# Patient Record
Sex: Female | Born: 1965 | Race: Black or African American | Hispanic: No | Marital: Single | Smoking: Current every day smoker
Health system: Southern US, Community
[De-identification: ages and names within clinical notes are randomized; demographics above are authoritative.]

## PROBLEM LIST (undated history)

## (undated) DIAGNOSIS — D649 Anemia, unspecified: Secondary | ICD-10-CM

## (undated) DIAGNOSIS — I10 Essential (primary) hypertension: Secondary | ICD-10-CM

## (undated) DIAGNOSIS — F419 Anxiety disorder, unspecified: Secondary | ICD-10-CM

## (undated) DIAGNOSIS — K219 Gastro-esophageal reflux disease without esophagitis: Secondary | ICD-10-CM

## (undated) DIAGNOSIS — M545 Low back pain: Secondary | ICD-10-CM

## (undated) DIAGNOSIS — F319 Bipolar disorder, unspecified: Secondary | ICD-10-CM

## (undated) DIAGNOSIS — G9511 Acute infarction of spinal cord (embolic) (nonembolic): Secondary | ICD-10-CM

## (undated) DIAGNOSIS — Z8489 Family history of other specified conditions: Secondary | ICD-10-CM

## (undated) DIAGNOSIS — R519 Headache, unspecified: Secondary | ICD-10-CM

## (undated) DIAGNOSIS — I639 Cerebral infarction, unspecified: Secondary | ICD-10-CM

## (undated) DIAGNOSIS — G8929 Other chronic pain: Secondary | ICD-10-CM

## (undated) DIAGNOSIS — Q613 Polycystic kidney, unspecified: Secondary | ICD-10-CM

## (undated) DIAGNOSIS — M1990 Unspecified osteoarthritis, unspecified site: Secondary | ICD-10-CM

## (undated) DIAGNOSIS — N289 Disorder of kidney and ureter, unspecified: Secondary | ICD-10-CM

## (undated) DIAGNOSIS — T4145XA Adverse effect of unspecified anesthetic, initial encounter: Secondary | ICD-10-CM

## (undated) DIAGNOSIS — F329 Major depressive disorder, single episode, unspecified: Secondary | ICD-10-CM

## (undated) DIAGNOSIS — G8220 Paraplegia, unspecified: Secondary | ICD-10-CM

## (undated) DIAGNOSIS — I7101 Dissection of thoracic aorta: Secondary | ICD-10-CM

## (undated) DIAGNOSIS — T8859XA Other complications of anesthesia, initial encounter: Secondary | ICD-10-CM

## (undated) DIAGNOSIS — F32A Depression, unspecified: Secondary | ICD-10-CM

## (undated) DIAGNOSIS — R51 Headache: Secondary | ICD-10-CM

## (undated) DIAGNOSIS — G43909 Migraine, unspecified, not intractable, without status migrainosus: Secondary | ICD-10-CM

---

## 1984-11-22 HISTORY — PX: TUBAL LIGATION: SHX77

## 1999-02-21 ENCOUNTER — Encounter: Payer: Self-pay | Admitting: Emergency Medicine

## 1999-02-21 ENCOUNTER — Emergency Department (HOSPITAL_COMMUNITY): Admission: EM | Admit: 1999-02-21 | Discharge: 1999-02-21 | Payer: Self-pay | Admitting: Emergency Medicine

## 1999-03-24 ENCOUNTER — Encounter: Payer: Self-pay | Admitting: Emergency Medicine

## 1999-03-24 ENCOUNTER — Emergency Department (HOSPITAL_COMMUNITY): Admission: EM | Admit: 1999-03-24 | Discharge: 1999-03-24 | Payer: Self-pay | Admitting: Emergency Medicine

## 2007-12-02 ENCOUNTER — Observation Stay (HOSPITAL_COMMUNITY): Admission: EM | Admit: 2007-12-02 | Discharge: 2007-12-02 | Payer: Self-pay | Admitting: Emergency Medicine

## 2009-04-20 ENCOUNTER — Emergency Department (HOSPITAL_COMMUNITY): Admission: EM | Admit: 2009-04-20 | Discharge: 2009-04-20 | Payer: Self-pay | Admitting: Emergency Medicine

## 2009-12-19 ENCOUNTER — Emergency Department (HOSPITAL_COMMUNITY): Admission: EM | Admit: 2009-12-19 | Discharge: 2009-12-19 | Payer: Self-pay | Admitting: Emergency Medicine

## 2009-12-22 ENCOUNTER — Emergency Department (HOSPITAL_COMMUNITY): Admission: EM | Admit: 2009-12-22 | Discharge: 2009-12-22 | Payer: Self-pay | Admitting: Emergency Medicine

## 2010-01-30 ENCOUNTER — Emergency Department (HOSPITAL_COMMUNITY): Admission: EM | Admit: 2010-01-30 | Discharge: 2010-01-30 | Payer: Self-pay | Admitting: Emergency Medicine

## 2010-02-16 ENCOUNTER — Emergency Department (HOSPITAL_COMMUNITY): Admission: EM | Admit: 2010-02-16 | Discharge: 2010-02-16 | Payer: Self-pay | Admitting: Emergency Medicine

## 2010-04-13 ENCOUNTER — Emergency Department (HOSPITAL_COMMUNITY): Admission: EM | Admit: 2010-04-13 | Discharge: 2010-04-14 | Payer: Self-pay | Admitting: Emergency Medicine

## 2010-05-08 ENCOUNTER — Emergency Department (HOSPITAL_COMMUNITY): Admission: EM | Admit: 2010-05-08 | Discharge: 2010-05-08 | Payer: Self-pay | Admitting: Emergency Medicine

## 2011-02-07 LAB — POCT I-STAT, CHEM 8
BUN: 11 mg/dL (ref 6–23)
Calcium, Ion: 1.12 mmol/L (ref 1.12–1.32)
Chloride: 106 meq/L (ref 96–112)
Creatinine, Ser: 1.1 mg/dL (ref 0.4–1.2)
Glucose, Bld: 100 mg/dL — ABNORMAL HIGH (ref 70–99)
HCT: 48 % — ABNORMAL HIGH (ref 36.0–46.0)
Hemoglobin: 16.3 g/dL — ABNORMAL HIGH (ref 12.0–15.0)
Potassium: 3.6 meq/L (ref 3.5–5.1)
Sodium: 139 meq/L (ref 135–145)
TCO2: 24 mmol/L (ref 0–100)

## 2011-02-07 LAB — URINALYSIS, ROUTINE W REFLEX MICROSCOPIC
Bilirubin Urine: NEGATIVE
Glucose, UA: NEGATIVE mg/dL
Ketones, ur: NEGATIVE mg/dL
Leukocytes, UA: NEGATIVE
Nitrite: NEGATIVE
Protein, ur: >300 mg/dL — AB
Specific Gravity, Urine: 1.014 (ref 1.005–1.030)
Urobilinogen, UA: 0.2 mg/dL (ref 0.0–1.0)
pH: 7 (ref 5.0–8.0)

## 2011-02-07 LAB — CBC
HCT: 44.3 % (ref 36.0–46.0)
Hemoglobin: 14.5 g/dL (ref 12.0–15.0)
MCHC: 32.8 g/dL (ref 30.0–36.0)
MCV: 83.1 fL (ref 78.0–100.0)
Platelets: 214 10*3/uL (ref 150–400)
RBC: 5.34 MIL/uL — ABNORMAL HIGH (ref 3.87–5.11)
RDW: 23.9 % — ABNORMAL HIGH (ref 11.5–15.5)
WBC: 6.2 K/uL (ref 4.0–10.5)

## 2011-02-07 LAB — URINE MICROSCOPIC-ADD ON

## 2011-02-07 LAB — PREGNANCY, URINE: Preg Test, Ur: NEGATIVE

## 2011-02-07 LAB — DIFFERENTIAL
Basophils Absolute: 0.1 K/uL (ref 0.0–0.1)
Basophils Relative: 1 % (ref 0–1)
Eosinophils Absolute: 0 K/uL (ref 0.0–0.7)
Eosinophils Relative: 0 % (ref 0–5)
Lymphocytes Relative: 15 % (ref 12–46)
Lymphs Abs: 0.9 10*3/uL (ref 0.7–4.0)
Monocytes Absolute: 0.3 K/uL (ref 0.1–1.0)
Monocytes Relative: 5 % (ref 3–12)
Neutro Abs: 4.9 K/uL (ref 1.7–7.7)
Neutrophils Relative %: 79 % — ABNORMAL HIGH (ref 43–77)

## 2011-02-07 LAB — POCT CARDIAC MARKERS
CKMB, poc: 1.5 ng/mL (ref 1.0–8.0)
Myoglobin, poc: 94.5 ng/mL (ref 12–200)
Troponin i, poc: <0.05 ng/mL (ref 0.00–0.09)

## 2011-02-08 LAB — COMPREHENSIVE METABOLIC PANEL WITH GFR
ALT: 13 U/L (ref 0–35)
AST: 16 U/L (ref 0–37)
Albumin: 3.4 g/dL — ABNORMAL LOW (ref 3.5–5.2)
Alkaline Phosphatase: 46 U/L (ref 39–117)
BUN: 16 mg/dL (ref 6–23)
CO2: 24 meq/L (ref 19–32)
Calcium: 8.8 mg/dL (ref 8.4–10.5)
Chloride: 108 meq/L (ref 96–112)
Creatinine, Ser: 1.31 mg/dL — ABNORMAL HIGH (ref 0.4–1.2)
GFR calc non Af Amer: 44 mL/min — ABNORMAL LOW
Glucose, Bld: 110 mg/dL — ABNORMAL HIGH (ref 70–99)
Potassium: 3.3 meq/L — ABNORMAL LOW (ref 3.5–5.1)
Sodium: 139 meq/L (ref 135–145)
Total Bilirubin: 0.3 mg/dL (ref 0.3–1.2)
Total Protein: 6.5 g/dL (ref 6.0–8.3)

## 2011-02-08 LAB — URINE MICROSCOPIC-ADD ON

## 2011-02-08 LAB — CBC
HCT: 39.6 % (ref 36.0–46.0)
Hemoglobin: 12.9 g/dL (ref 12.0–15.0)
MCHC: 32.6 g/dL (ref 30.0–36.0)
MCV: 80.2 fL (ref 78.0–100.0)
Platelets: 223 10*3/uL (ref 150–400)
RBC: 4.94 MIL/uL (ref 3.87–5.11)
RDW: 29.5 % — ABNORMAL HIGH (ref 11.5–15.5)
WBC: 5.9 10*3/uL (ref 4.0–10.5)

## 2011-02-08 LAB — POCT I-STAT, CHEM 8
BUN: 10 mg/dL (ref 6–23)
Calcium, Ion: 1.16 mmol/L (ref 1.12–1.32)
Chloride: 109 meq/L (ref 96–112)
Creatinine, Ser: 1.1 mg/dL (ref 0.4–1.2)
Glucose, Bld: 79 mg/dL (ref 70–99)
HCT: 31 % — ABNORMAL LOW (ref 36.0–46.0)
Hemoglobin: 10.5 g/dL — ABNORMAL LOW (ref 12.0–15.0)
Potassium: 3.5 meq/L (ref 3.5–5.1)
Sodium: 140 meq/L (ref 135–145)
TCO2: 25 mmol/L (ref 0–100)

## 2011-02-08 LAB — DIFFERENTIAL
Basophils Absolute: 0 10*3/uL (ref 0.0–0.1)
Basophils Relative: 0 % (ref 0–1)
Eosinophils Absolute: 0 10*3/uL (ref 0.0–0.7)
Eosinophils Relative: 0 % (ref 0–5)
Lymphocytes Relative: 17 % (ref 12–46)
Lymphs Abs: 1 10*3/uL (ref 0.7–4.0)
Monocytes Absolute: 0.2 10*3/uL (ref 0.1–1.0)
Monocytes Relative: 4 % (ref 3–12)
Neutro Abs: 4.7 10*3/uL (ref 1.7–7.7)
Neutrophils Relative %: 79 % — ABNORMAL HIGH (ref 43–77)

## 2011-02-08 LAB — WET PREP, GENITAL
Trich, Wet Prep: NONE SEEN
WBC, Wet Prep HPF POC: NONE SEEN
Yeast Wet Prep HPF POC: NONE SEEN

## 2011-02-08 LAB — URINALYSIS, ROUTINE W REFLEX MICROSCOPIC
Glucose, UA: NEGATIVE mg/dL
Ketones, ur: NEGATIVE mg/dL
Nitrite: NEGATIVE
Protein, ur: 30 mg/dL — AB
Specific Gravity, Urine: 1.018 (ref 1.005–1.030)
Specific Gravity, Urine: 1.029 (ref 1.005–1.030)
Urobilinogen, UA: 1 mg/dL (ref 0.0–1.0)
pH: 5.5 (ref 5.0–8.0)

## 2011-02-08 LAB — RAPID URINE DRUG SCREEN, HOSP PERFORMED
Amphetamines: NOT DETECTED
Benzodiazepines: NOT DETECTED
Cocaine: NOT DETECTED

## 2011-02-08 LAB — LIPASE, BLOOD: Lipase: 21 U/L (ref 11–59)

## 2011-02-08 LAB — GC/CHLAMYDIA PROBE AMP, GENITAL
Chlamydia, DNA Probe: NEGATIVE
GC Probe Amp, Genital: NEGATIVE

## 2011-02-09 ENCOUNTER — Encounter: Payer: Self-pay | Admitting: Internal Medicine

## 2011-02-09 ENCOUNTER — Encounter (INDEPENDENT_AMBULATORY_CARE_PROVIDER_SITE_OTHER): Payer: Self-pay | Admitting: Internal Medicine

## 2011-02-09 DIAGNOSIS — I10 Essential (primary) hypertension: Secondary | ICD-10-CM | POA: Insufficient documentation

## 2011-02-09 DIAGNOSIS — R110 Nausea: Secondary | ICD-10-CM | POA: Insufficient documentation

## 2011-02-09 DIAGNOSIS — D649 Anemia, unspecified: Secondary | ICD-10-CM | POA: Insufficient documentation

## 2011-02-09 DIAGNOSIS — F17200 Nicotine dependence, unspecified, uncomplicated: Secondary | ICD-10-CM | POA: Insufficient documentation

## 2011-02-09 DIAGNOSIS — F1410 Cocaine abuse, uncomplicated: Secondary | ICD-10-CM | POA: Insufficient documentation

## 2011-02-15 LAB — POCT I-STAT, CHEM 8
BUN: 16 mg/dL (ref 6–23)
Creatinine, Ser: 1 mg/dL (ref 0.4–1.2)
Creatinine, Ser: 1.1 mg/dL (ref 0.4–1.2)
HCT: 32 % — ABNORMAL LOW (ref 36.0–46.0)
Hemoglobin: 10.2 g/dL — ABNORMAL LOW (ref 12.0–15.0)
Hemoglobin: 10.9 g/dL — ABNORMAL LOW (ref 12.0–15.0)
Potassium: 3.5 mEq/L (ref 3.5–5.1)
Sodium: 139 mEq/L (ref 135–145)
Sodium: 140 mEq/L (ref 135–145)
TCO2: 23 mmol/L (ref 0–100)
TCO2: 25 mmol/L (ref 0–100)

## 2011-02-15 LAB — URINALYSIS, ROUTINE W REFLEX MICROSCOPIC
Glucose, UA: NEGATIVE mg/dL
Ketones, ur: NEGATIVE mg/dL
Protein, ur: NEGATIVE mg/dL
Urobilinogen, UA: 0.2 mg/dL (ref 0.0–1.0)

## 2011-02-15 LAB — RAPID URINE DRUG SCREEN, HOSP PERFORMED
Benzodiazepines: NOT DETECTED
Cocaine: NOT DETECTED
Tetrahydrocannabinol: NOT DETECTED

## 2011-02-15 LAB — DIFFERENTIAL
Band Neutrophils: 0 % (ref 0–10)
Basophils Absolute: 0 10*3/uL (ref 0.0–0.1)
Basophils Relative: 0 % (ref 0–1)
Blasts: 0 %
Eosinophils Absolute: 0.1 10*3/uL (ref 0.0–0.7)
Eosinophils Relative: 2 % (ref 0–5)
Lymphocytes Relative: 33 % (ref 12–46)
Lymphs Abs: 1.7 10*3/uL (ref 0.7–4.0)
Monocytes Absolute: 0.4 10*3/uL (ref 0.1–1.0)
Monocytes Relative: 8 % (ref 3–12)

## 2011-02-15 LAB — POCT CARDIAC MARKERS
CKMB, poc: <1 ng/mL — ABNORMAL LOW (ref 1.0–8.0)
Myoglobin, poc: 49.3 ng/mL (ref 12–200)
Myoglobin, poc: 51.8 ng/mL (ref 12–200)

## 2011-02-15 LAB — CBC
HCT: 28.6 % — ABNORMAL LOW (ref 36.0–46.0)
Hemoglobin: 8.6 g/dL — ABNORMAL LOW (ref 12.0–15.0)
MCV: 64.7 fL — ABNORMAL LOW (ref 78.0–100.0)
RBC: 4.42 MIL/uL (ref 3.87–5.11)
WBC: 5.1 10*3/uL (ref 4.0–10.5)

## 2011-02-15 LAB — URINE MICROSCOPIC-ADD ON

## 2011-02-18 NOTE — Letter (Signed)
Summary: Generic Letter  [facility]  [address] [place], [place] [address]   Phone: [phone]  Fax: [phone]    02/09/2011  Fidela Juneau [address] [place] [address]   Re:  [facility]      [address]      [place], [place]  [address]  Dear Ms. Davis:  Ms. Luff was seen today in our clinic for her hypertension.  We are scheduling a complete physical.       Sincerely,   Julieanne Manson MD

## 2011-02-18 NOTE — Letter (Signed)
Summary: Letter//FAXED TO Maralyn Sago DAVIS  Letter//FAXED TO SARAH DAVIS   Imported By: Arta Bruce 02/09/2011 16:13:18  _____________________________________________________________________  External Attachment:    Type:   Image     Comment:   External Document

## 2011-02-18 NOTE — Assessment & Plan Note (Signed)
Summary: Here to establish, BP concerns   Vital Signs:  Patient profile:   45 year old female Menstrual status:  regular LMP:     02/01/2011 Height:      65.75 inches Weight:      131.50 pounds BMI:     21.46 Temp:     97.9 degrees F oral Pulse rate:   80 / minute Pulse rhythm:   regular Resp:     26 per minute BP sitting:   152 / 94  (left arm) Cuff size:   regular  Vitals Entered By: Hale Drone CMA (February 09, 2011 2:43 PM) CC: Here to establish... BP concerns... Needs refills on her Lisinopril HCTZ and Amlodipine.... Needs documentation faxed to her lawyer stating she was here so they can start her SSI. Is Patient Diabetic? No Pain Assessment Patient in pain? no       Does patient need assistance? Functional Status Self care Ambulation Normal LMP (date): 02/01/2011     Menstrual Status regular Enter LMP: 02/01/2011   CC:  Here to establish... BP concerns... Needs refills on her Lisinopril HCTZ and Amlodipine.... Needs documentation faxed to her lawyer stating she was here so they can start her SSI.[name]  History of Present Illness: 45 yo female here to establish.  1.  Hypertension:  Diagnosed 2 years ago.  Pt. cannot say whether her bp was controlled on meds she was last prescribed or not.  Does state she did not have a headache when taking the meds (Amlodipine and Lisinopril/HCTZ.    2.  Bipolar Disorder, mainly depressed.  Follows at [facility].  3. ? Left Ovarian Cyst;  diagnosed 1-2 years ago, but did not have any medical coverage, so did not have anything done.  Diagnosed in ED per pt.  Echart, however, shows no ovarian lesion, but does have bilateral  polycystic kidney disease.  She also was noted to have a fibroid.  CT and ultrasound done in evaluation in past..  4.  Nausea:  Awakens with nausea every day.  By hospital records, appears she has been seen in ED mulitple times for this with extensive workup and no findings.  No vomiting.  Goes away when has a  cup of coffee and cigarette.  Has been having this for 2 years.  Can have acid reflux symptoms if eats spicy foods.  Has not had melena or hematochezia.  Has never tried an acid reducer.  Does have a history of anemia.  Does have regular periods.  They can be heavy.  Has never performed guaiac cards.  No NSAIDS    6.  To bacco Abuse:  smokes 1ppd-2ppd.  Current Medications (verified): 1)  Lisinopril-Hydrochlorothiazide 10-12.5 Mg Tabs (Lisinopril-Hydrochlorothiazide) .[name].. 1 Tab Daily (Dr. Trellis Moment, Eugenia Pancoast) 2)  Amlodipine Besylate 5 Mg Tabs (Amlodipine Besylate) .[name].. 1 Tab Daily (Dr. Trellis Moment, Eugenia Pancoast)  Allergies (verified): No Known Drug Allergies  Social History: Lives with her daughter. Unemployed--trying to get disability States she cannot read. Tobacco:  Started age 83.  Smokes up to 2 ppd Alcohol:  Drinks mainly on weekend.  Two 40 oz a day during weekend. 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 Lungs are clear to auscultation, no crackles or wheezes. Heart:  Normal rate and regular rhythm. S1 and S2 normal without gallop, murmur, click, rub or other extra sounds.  Radial pulses normal and equal Abdomen:  soft, normal bowel sounds, no masses, no hepatomegaly, and no splenomegaly.  Some tenderness in LLQ.  No rebound or peritoneal signs   Impression & Recommendations:  Problem # 1:  HYPERTENSION, ESSENTIAL (ICD-401.9) Restart meds Her updated medication list for this problem includes:    Lisinopril-hydrochlorothiazide 10-12.5 Mg Tabs (Lisinopril-hydrochlorothiazide) .[name]... 1 tab by mouth daily    Amlodipine Besylate 5 Mg Tabs (Amlodipine besylate) .[name]... 1 tab by mouth daily  Problem # 2:  TOBACCO ABUSE (ICD-305.1) Encouraged cessation. Pt. seems to have little interest discussed this may be adding to nausea in morning  Problem # 3:  NAUSEA  (ICD-787.02) Start Ranitidine Encouraged a healthier diet. Avoid NSAIDS  Complete Medication List: 1)  Lisinopril-hydrochlorothiazide 10-12.5 Mg Tabs (Lisinopril-hydrochlorothiazide) .[name].. 1 tab by mouth daily 2)  Amlodipine Besylate 5 Mg Tabs (Amlodipine besylate) .[name].. 1 tab by mouth daily 3)  Zoloft 100 Mg Tabs (Sertraline hcl) .[name].. 1 tab by mouth daily  [facility] 4)  Seroquel 100 Mg Tabs (Quetiapine fumarate) .[name].. 1 tab by mouth at bedtime.    [facility] 5)  Ranitidine Hcl 150 Mg Tabs (Ranitidine hcl) .[name].. 1 tab by mouth two times a day  Patient Instructions: 1)  Schedule CPP with Dr. Delrae Alfred next available. 2)  Nurse visit for bp check and labs in 1 month:  CBC, CMET, UA, 3)  Bring in completed stool cards to nurse visit. Prescriptions: RANITIDINE HCL 150 MG TABS (RANITIDINE HCL) 1 tab by mouth two times a day  #60 x 4   Entered and Authorized by:   Julieanne Manson MD   Signed by:   Julieanne Manson MD on 02/09/2011   Method used:   Faxed to ...       [facility] (retail)       [address] [place], [place]  [address]       Ph: [phone]       Fax: [phone]   RxID:   [id number] AMLODIPINE BESYLATE 5 MG TABS (AMLODIPINE BESYLATE) 1 tab by mouth daily  #30 x 4   Entered and Authorized by:   Julieanne Manson MD   Signed by:   Julieanne Manson MD on 02/09/2011   Method used:   Faxed to ...       [facility] (retail)       [address] [place], [place]  [address]       Ph: [phone]       Fax: [phone]   RxID:   [id number] LISINOPRIL-HYDROCHLOROTHIAZIDE 10-12.5 MG TABS (LISINOPRIL-HYDROCHLOROTHIAZIDE) 1 tab by mouth daily  #30 x 4   Entered and Authorized by:   Julieanne Manson MD   Signed by:   Julieanne Manson MD on 02/09/2011   Method used:   Faxed to ...       [facility] (retail)       [address] [place], [place]  [address]       Ph: [phone]       Fax: [phone]   RxID:   [id number]    Orders Added: 1)  New Patient Level II [48546]

## 2011-03-02 LAB — LIPASE, BLOOD: Lipase: 22 U/L (ref 11–59)

## 2011-03-02 LAB — CBC
HCT: 28.3 % — ABNORMAL LOW (ref 36.0–46.0)
Hemoglobin: 9.2 g/dL — ABNORMAL LOW (ref 12.0–15.0)
WBC: 7.9 10*3/uL (ref 4.0–10.5)

## 2011-03-02 LAB — COMPREHENSIVE METABOLIC PANEL
Alkaline Phosphatase: 57 U/L (ref 39–117)
BUN: 9 mg/dL (ref 6–23)
Chloride: 106 mEq/L (ref 96–112)
Glucose, Bld: 86 mg/dL (ref 70–99)
Potassium: 3.6 mEq/L (ref 3.5–5.1)
Total Bilirubin: 0.6 mg/dL (ref 0.3–1.2)

## 2011-03-02 LAB — URINALYSIS, ROUTINE W REFLEX MICROSCOPIC
Glucose, UA: NEGATIVE mg/dL
Hgb urine dipstick: NEGATIVE
Protein, ur: NEGATIVE mg/dL

## 2011-03-02 LAB — DIFFERENTIAL
Eosinophils Absolute: 0.1 10*3/uL (ref 0.0–0.7)
Monocytes Absolute: 0.5 10*3/uL (ref 0.1–1.0)
Neutrophils Relative %: 81 % — ABNORMAL HIGH (ref 43–77)

## 2011-03-02 LAB — GC/CHLAMYDIA PROBE AMP, GENITAL: GC Probe Amp, Genital: NEGATIVE

## 2011-03-02 LAB — WET PREP, GENITAL: Yeast Wet Prep HPF POC: NONE SEEN

## 2011-03-02 LAB — POCT PREGNANCY, URINE: Preg Test, Ur: NEGATIVE

## 2011-04-06 NOTE — Discharge Summary (Signed)
[name], BENNIS                ACCOUNT NO.:  [id number]   MEDICAL RECORD NO.:  [id number]          PATIENT TYPE:  OBV   LOCATION:  1536                         FACILITY[facility]  [facility]   PHYSICIAN:  Altha Harm, MDDATE OF BIRTH:  1966/04/18   DATE OF ADMISSION:  12/02/2007  DATE OF DISCHARGE:  12/02/2007                               DISCHARGE SUMMARY   DISCHARGE DISPOSITION:  Home with referral to [name] clinic.  Please note this patient was brought in on an observation basis only.   FINAL DISCHARGE DIAGNOSES:  1. Gastritis, resolved.  2. Mild dehydration, resolved.  3. Alcohol intoxication, resolved.  4. Chronic iron deficiency anemia, likely associated with      menometrorrhagia.  5. Lower abdominal pain.   DISCHARGE MEDICATIONS:  1. Iron sulfate 325 mg p.o. t.i.d.  2. Thiamin 50 mg p.o. daily.  3. Folate 1 mg p.o. daily.  4. Vicodin 1 tab p.o. q.4-6h. p.r.n. pain.   CONSULTATIONS:  None.   PROCEDURES:  None.   DIAGNOSTIC STUDIES:  A CT of the abdomen and pelvis with contrast, which  showed cysts in the kidney consistent with adult polycystic kidney  disease with multiple hepatic and renal cysts bilaterally.  No acute  abdominal findings.  No acute pelvic findings.   Laboratory studies of importance:  White blood cell count 6, hemoglobin  7.7, hematocrit 24.8, platelet count 335.  Reticulocyte count 0.8.  Absolute reticulocyte count 31.5.  PT 13.7, INR 1, PTT 33.  TSH 1.752,  normal.  Serum iron 10.  Ferritin 4.   CHIEF COMPLAINT:  Emesis and abdominal pain.   HISTORY OF PRESENT ILLNESS:  Please see the H&P for details of the HPI;  however, in short, this is a patient who states that she has had a 3-  week course of abdominal pain which is occurring in the lower abdominal  area; however, the pain was not sufficient to prevent the patient from  participating in her activities of daily living or in sexual coital  activity.  The patient also had complaints of  emesis occurring  approximately 12 hours prior to arrival to the emergency room; however,  the patient had been drinking heavily with an alcohol level of 139 mg  per dL.  The patient was evaluated in the emergency room and we were  asked to see the patient as the patient continued to be unable to  tolerate oral intake while in the emergency room.  The patient was  admitted on an observation basis essentially for the gastritis and the  intractable vomiting.   HOSPITAL COURSE:  The patient was admitted.  She was given aggressive  hydration.  The patient was started on liquids and advanced to a regular  diet, which she tolerated without any difficulty.  The patient was  placed on Protonix.   The patient was found to have a profound anemia of 7.7; however, the  patient was hemodynamically stable and denied any weakness, any  dizziness or any symptoms associated with the anemia. The pt was able to  ambulate around the unit without any difficulty or  ned for assistance.  Iron studies were done that showed the patient had a low iron level.  The patient was started on iron sulfate and is to continue on her iron  sulfate.  In terms of her abdominal pain, the patient was treated  intermittently on a p.r.n. basis with Vicodin, which she tolerated well.  The patient's pain does not seem to be a great issue for her right now.  CT of the abdomen was done and did not reveal any findings except  findings to support adult polycystic kidney disease.  The patient was  counseled on alcohol and tobacco cessation; however, the patient is in a  pre-contemplative stage and does not wish to enter into a detox program  at this time.  The patient was counseled on her profound anemia and the  fact that her alcohol associated with gastritis could continue to lead  to further anemia.  The patient is being referred to the [facility]  clinic for followup and can be followed up as an outpatient for further  evaluation  of her profound anemia.      Altha Harm, MD  Electronically Signed     MAM/MEDQ  D:  12/02/2007  T:  12/02/2007  Job:  [id number]

## 2011-04-06 NOTE — H&P (Signed)
NAMELUTHER, NEWHOUSE NO.:  [id number]   MEDICAL RECORD NO.:  [id number]          PATIENT TYPE:  OBV   LOCATION:  0102                         [facility]ACILITY[facility]  [facility]   PHYSICIAN:  Altha Harm, MDDATE OF BIRTH:  1966-08-26   DATE OF ADMISSION:  12/02/2007  DATE OF DISCHARGE:                              HISTORY & PHYSICAL   CHIEF COMPLAINT:  Abdominal pain and emesis.   HISTORY OF PRESENT ILLNESS:  This is a 45 year old African-American  female who presented to the emergency room with alcoholic intoxication  and complaints of abdominal pain. The patient states that the pain has  been occurring for the three weeks and is intermittent to sharp in  nature. She states the pain is nonradiating. The pain, she states, has  not gotten progressively worse but has persisted over the past three  weeks. She states that in the last 72 hours she has had several episodes  of emesis which is nonbilious and nonhematemesous. The patient states  that she is unable to keep any food down. However, Incidentally the  patient has an alcohol level at 139.   The patient states that she is also sexually active and has had sexual  activity approximately one to two times a day for three to five days in  the last week or two. The patient is unable to quantify her abdominal  pain at this time. The patient states that she has also had sick  contacts with several members of her community having gastroenteritis.  The patient denies any vaginal discharge. She denies any vaginal  bleeding. She denies any diarrhea. She denies any fever or chills. She  denies any cough, dizziness. The patient denies any loss of  consciousness. She denies any palpitations.   During the workup here in the emergency room, the patient was found to  have hemoglobin of 7.7, a platelet count of 335, white blood cell count  6. The patient also had a CT of the abdomen and pelvis performed with  contrast. Thus, it is  important that the patient is able to maintain her  hydration in order to prevent contrast-induced nephropathy. We are asked  to see the patient and admit her for the emesis and the abdominal pain.   PAST MEDICAL HISTORY:  Not significant. The patient has no chronic  illnesses. She takes no medication on a chronic basis. However, she  states she has been taking Excedrin.   FAMILY HISTORY:  The patient is unable to provide a family history.   SOCIAL HISTORY:  The patient drinks alcohol approximately 120 ounces of  beer every other day. The patient smokes two packs per day of cigarettes  for 31 years, and the patient denies any drug use. As stated before, she  is currently not on any medication. She has no known drug allergies, and  she has no primary care physician.   REVIEW OF SYSTEMS:  Twelve systems are reviewed. All systems are  negative except as noted in the HPI.   LABORATORY STUDIES:  She had the following:  Sodium 133, potassium 3.2,  chloride  106, bicarb 19, creatinine 12, creatinine 1.06. White blood  cell count 6, hemoglobin 7.7, hematocrit 24.8, platelet count 335.  Alcohol level 139. Wet prep is negative. CT of the abdomen and pelvis is  negative for any acute abdominal series. However, the patient does have  multiple cysts in the kidneys. Chest x-ray negative for any acute  pulmonary conditions.   PHYSICAL EXAMINATION:  The patient has a temperature of 98.6, blood  pressure of 104/92, heart rate of 79, respiratory rate 16. O2  saturations are 98% on room air.  HEENT:  She is normocephalic, atraumatic. Pupils are equal, round, and  reactive to light and accommodation. Extraocular movements are intact.  Fundi are benign. Oropharynx moist. No exudate, erythema or lesions  noted.  NECK EXAM:  Trachea is midline. No masses. No thyromegaly. No JVD. No  carotid bruit.  RESPIRATORY EXAMINATION:  The patient has a normal respiratory effort.  Equal excursion bilaterally. No  wheezing or rhonchi noted  CARDIOVASCULAR:  She has got a normal S1 and S2. No murmurs, rubs, or  gallops are noted. PMI is nondisplaced. No heaves or thrills on  palpation.  ABDOMINAL EXAM:  The patient has normal active bowel sounds. Abdomen is  soft, nontender, nondistended. No masses. No hepatosplenomegaly noted.  VAGINAL EXAMINATION:  The patient is refusing vaginal examination at  this time.  MUSCULOSKELETAL:  The patient has no warmth, swelling or erythema around  the joints. She has got no spinal tenderness. The patient does have some  soreness on the paraspinal muscles with some spasm noted. Gait:  The  patient has a normal gait. There is ataxia or staggering noted.  LYMPH NODE SURVEY:  She has got no cervical, axillary or inguinal  lymphadenopathy.  NEUROLOGICAL:  Cranial nerves II-XII are grossly intact. The patient has  strength 5/5 bilaterally upper and lower extremities. DTRs are 2+  bilaterally upper and lower extremities. The patient is intact to light  touch and proprioception. The patient is able to tandem walk without any  difficulty. She has no pass pointing present, and she is able to do heel  to toe slides without any differential. Romberg is negative. There is no  asterixis present on examination.  PSYCHIATRIC:  The patient is alert and oriented x3. She has good recent  to remote recall. The patient has good cognition. However, her insight  appears to be impaired which is related to alcohol use.   ASSESSMENT AND PLAN:  The patient presents with:  1. Chronic anemia. The patient is completely hemodynamically stable      and shows no signs of this being an acute process. The patient      needs to be referred to an outpatient primary care physician for      workup for her anemia. In the meantime, I will get an iron panel on      patient here in the hospital, and if indeed she shows iron-      deficiency anemia, I will go ahead and start her on iron.  2. Nausea,  vomiting. The patient has not had any emesis since last      night at 11:00. Right now, she is being challenged with fluids. If      the patient is able to maintain her hydration, then she can      certainly be discharged for further workup as an outpatient. In the      meantime, the patient was given IV hydration to augment her  hydration, particularly post IV contrast use.  3. Alcohol intoxication. The patient presented with alcohol      intoxication. However, her alcohol levels have dropped to the level      in which the patient is alert and oriented x3. The patient is able      to ambulate without any abnormalities and is not a risk for falling      at this time. I asked the patient if she is interested in stopping      drinking, and the patient has denied that that is a desire for her      right now. Thus, nothing further will be pursued on this patient.      If the patient shows any signs or symptoms of DTs, then a DT      protocol will be instituted. However, at this time, we will not get      the patient any benzodiazepines or any sedatives. The patient will      be given thiamine and folate as multivitamin supplements, however.  4. In terms of her abdominal pain, the patient, although she has lower      abdominal pain, does not seem to have a physical basis for this.      The patient is unable to quantify her pain. However, her abdominal      pain does not appear to present her from performing her activities      of daily living and at this time appears to be a minor concern for      the patient. In terms of the vomiting, this is probably secondary      to gastritis which may be viral in nature or it may be more likely      related to her alcohol use. The patient will be put on Protonix and      asked to follow up as an outpatient.   The patient has been counseled against further alcohol use and further  tobacco use. I see a need only for observation on this patient, unless   the patient's medical condition changes, which would necessitate her  conversion to a full admission.      Altha Harm, MD  Electronically Signed     MAM/MEDQ  D:  12/02/2007  T:  12/02/2007  Job:  [id number]

## 2011-08-12 LAB — BASIC METABOLIC PANEL
BUN: 10
CO2: 22
Chloride: 113 — ABNORMAL HIGH
Glucose, Bld: 101 — ABNORMAL HIGH
Potassium: 4.2
Sodium: 138

## 2011-08-12 LAB — LIPASE, BLOOD: Lipase: 29

## 2011-08-12 LAB — IRON AND TIBC
Iron: 10 — ABNORMAL LOW
UIBC: 323
UIBC: 363

## 2011-08-12 LAB — COMPREHENSIVE METABOLIC PANEL
ALT: 13
Alkaline Phosphatase: 50
BUN: 12
CO2: 19
Chloride: 106
Glucose, Bld: 88
Potassium: 3.2 — ABNORMAL LOW
Sodium: 133 — ABNORMAL LOW
Total Bilirubin: 0.5

## 2011-08-12 LAB — CBC
HCT: 24.8 — ABNORMAL LOW
Hemoglobin: 7.7 — CL
MCV: 61.7 — ABNORMAL LOW
Platelets: 335
WBC: 6

## 2011-08-12 LAB — URINALYSIS, ROUTINE W REFLEX MICROSCOPIC
Glucose, UA: NEGATIVE
Hgb urine dipstick: NEGATIVE
Specific Gravity, Urine: 1.002 — ABNORMAL LOW
Urobilinogen, UA: 0.2

## 2011-08-12 LAB — DIFFERENTIAL
Eosinophils Absolute: 0.1
Eosinophils Relative: 2
Lymphs Abs: 2.6
Monocytes Absolute: 0.4
Neutrophils Relative %: 46

## 2011-08-12 LAB — WET PREP, GENITAL: Yeast Wet Prep HPF POC: NONE SEEN

## 2011-08-12 LAB — TYPE AND SCREEN
ABO/RH(D): O POS
Antibody Screen: NEGATIVE

## 2011-08-12 LAB — POCT PREGNANCY, URINE
Operator id: 24446
Preg Test, Ur: NEGATIVE

## 2011-08-12 LAB — PROTIME-INR
INR: 1
Prothrombin Time: 13.7

## 2011-08-12 LAB — APTT: aPTT: 33

## 2011-08-12 LAB — FERRITIN: Ferritin: 4 — ABNORMAL LOW (ref 10–291)

## 2011-08-12 LAB — ETHANOL: Alcohol, Ethyl (B): 139 — ABNORMAL HIGH

## 2013-06-01 ENCOUNTER — Other Ambulatory Visit (HOSPITAL_COMMUNITY): Payer: Self-pay | Admitting: Internal Medicine

## 2013-06-01 DIAGNOSIS — N946 Dysmenorrhea, unspecified: Secondary | ICD-10-CM

## 2013-06-01 DIAGNOSIS — Z1231 Encounter for screening mammogram for malignant neoplasm of breast: Secondary | ICD-10-CM

## 2013-06-13 ENCOUNTER — Ambulatory Visit (HOSPITAL_COMMUNITY): Payer: Self-pay | Attending: Internal Medicine

## 2013-06-13 ENCOUNTER — Ambulatory Visit (HOSPITAL_COMMUNITY): Admission: RE | Admit: 2013-06-13 | Payer: Self-pay | Source: Ambulatory Visit

## 2014-06-29 ENCOUNTER — Encounter (HOSPITAL_COMMUNITY): Payer: Self-pay | Admitting: Anesthesiology

## 2014-06-29 ENCOUNTER — Emergency Department (HOSPITAL_COMMUNITY): Payer: Medicaid Other

## 2014-06-29 ENCOUNTER — Inpatient Hospital Stay (HOSPITAL_COMMUNITY)
Admission: EM | Admit: 2014-06-29 | Discharge: 2014-07-08 | DRG: 091 | Disposition: A | Discharge status: Rehabilitation Facility | Payer: Medicaid Other | Attending: Internal Medicine | Admitting: Internal Medicine

## 2014-06-29 ENCOUNTER — Encounter (HOSPITAL_COMMUNITY): Payer: Self-pay | Admitting: Emergency Medicine

## 2014-06-29 DIAGNOSIS — F3289 Other specified depressive episodes: Secondary | ICD-10-CM | POA: Diagnosis present

## 2014-06-29 DIAGNOSIS — I7100 Dissection of unspecified site of aorta: Secondary | ICD-10-CM

## 2014-06-29 DIAGNOSIS — Q613 Polycystic kidney, unspecified: Secondary | ICD-10-CM | POA: Diagnosis not present

## 2014-06-29 DIAGNOSIS — N183 Chronic kidney disease, stage 3 unspecified: Secondary | ICD-10-CM | POA: Diagnosis present

## 2014-06-29 DIAGNOSIS — N179 Acute kidney failure, unspecified: Secondary | ICD-10-CM

## 2014-06-29 DIAGNOSIS — I71019 Dissection of thoracic aorta, unspecified: Secondary | ICD-10-CM | POA: Diagnosis present

## 2014-06-29 DIAGNOSIS — R6250 Unspecified lack of expected normal physiological development in childhood: Secondary | ICD-10-CM | POA: Diagnosis present

## 2014-06-29 DIAGNOSIS — I7103 Dissection of thoracoabdominal aorta: Secondary | ICD-10-CM

## 2014-06-29 DIAGNOSIS — R5381 Other malaise: Secondary | ICD-10-CM | POA: Diagnosis present

## 2014-06-29 DIAGNOSIS — I10 Essential (primary) hypertension: Secondary | ICD-10-CM

## 2014-06-29 DIAGNOSIS — G9519 Other vascular myelopathies: Principal | ICD-10-CM | POA: Diagnosis present

## 2014-06-29 DIAGNOSIS — F329 Major depressive disorder, single episode, unspecified: Secondary | ICD-10-CM | POA: Diagnosis present

## 2014-06-29 DIAGNOSIS — F8189 Other developmental disorders of scholastic skills: Secondary | ICD-10-CM | POA: Diagnosis present

## 2014-06-29 DIAGNOSIS — Z23 Encounter for immunization: Secondary | ICD-10-CM | POA: Diagnosis not present

## 2014-06-29 DIAGNOSIS — I129 Hypertensive chronic kidney disease with stage 1 through stage 4 chronic kidney disease, or unspecified chronic kidney disease: Secondary | ICD-10-CM | POA: Diagnosis present

## 2014-06-29 DIAGNOSIS — D649 Anemia, unspecified: Secondary | ICD-10-CM

## 2014-06-29 DIAGNOSIS — F17200 Nicotine dependence, unspecified, uncomplicated: Secondary | ICD-10-CM | POA: Diagnosis present

## 2014-06-29 DIAGNOSIS — J90 Pleural effusion, not elsewhere classified: Secondary | ICD-10-CM | POA: Diagnosis not present

## 2014-06-29 DIAGNOSIS — I7101 Dissection of thoracic aorta: Secondary | ICD-10-CM

## 2014-06-29 DIAGNOSIS — E876 Hypokalemia: Secondary | ICD-10-CM | POA: Diagnosis present

## 2014-06-29 DIAGNOSIS — I711 Thoracic aortic aneurysm, ruptured, unspecified: Secondary | ICD-10-CM

## 2014-06-29 DIAGNOSIS — G8220 Paraplegia, unspecified: Secondary | ICD-10-CM

## 2014-06-29 DIAGNOSIS — I4891 Unspecified atrial fibrillation: Secondary | ICD-10-CM | POA: Diagnosis present

## 2014-06-29 DIAGNOSIS — I639 Cerebral infarction, unspecified: Secondary | ICD-10-CM

## 2014-06-29 DIAGNOSIS — R110 Nausea: Secondary | ICD-10-CM

## 2014-06-29 DIAGNOSIS — F1410 Cocaine abuse, uncomplicated: Secondary | ICD-10-CM

## 2014-06-29 DIAGNOSIS — Z5189 Encounter for other specified aftercare: Secondary | ICD-10-CM | POA: Diagnosis not present

## 2014-06-29 DIAGNOSIS — R7309 Other abnormal glucose: Secondary | ICD-10-CM | POA: Diagnosis present

## 2014-06-29 DIAGNOSIS — R29898 Other symptoms and signs involving the musculoskeletal system: Secondary | ICD-10-CM

## 2014-06-29 DIAGNOSIS — R079 Chest pain, unspecified: Secondary | ICD-10-CM | POA: Diagnosis present

## 2014-06-29 DIAGNOSIS — I7123 Aneurysm of the descending thoracic aorta, without rupture: Secondary | ICD-10-CM

## 2014-06-29 DIAGNOSIS — G9511 Acute infarction of spinal cord (embolic) (nonembolic): Secondary | ICD-10-CM

## 2014-06-29 DIAGNOSIS — I71012 Dissection of descending thoracic aorta: Secondary | ICD-10-CM | POA: Diagnosis present

## 2014-06-29 HISTORY — PX: OTHER SURGICAL HISTORY: SHX169

## 2014-06-29 HISTORY — DX: Cerebral infarction, unspecified: I63.9

## 2014-06-29 HISTORY — DX: Anxiety disorder, unspecified: F41.9

## 2014-06-29 HISTORY — DX: Headache: R51

## 2014-06-29 HISTORY — DX: Anemia, unspecified: D64.9

## 2014-06-29 HISTORY — DX: Unspecified osteoarthritis, unspecified site: M19.90

## 2014-06-29 HISTORY — DX: Family history of other specified conditions: Z84.89

## 2014-06-29 HISTORY — DX: Adverse effect of unspecified anesthetic, initial encounter: T41.45XA

## 2014-06-29 HISTORY — DX: Aneurysm of the descending thoracic aorta, without rupture: I71.23

## 2014-06-29 HISTORY — DX: Acute infarction of spinal cord (embolic) (nonembolic): G95.11

## 2014-06-29 HISTORY — DX: Dissection of thoracic aorta: I71.01

## 2014-06-29 HISTORY — DX: Headache, unspecified: R51.9

## 2014-06-29 HISTORY — DX: Polycystic kidney, unspecified: Q61.3

## 2014-06-29 HISTORY — DX: Depression, unspecified: F32.A

## 2014-06-29 HISTORY — DX: Bipolar disorder, unspecified: F31.9

## 2014-06-29 HISTORY — DX: Essential (primary) hypertension: I10

## 2014-06-29 HISTORY — DX: Paraplegia, unspecified: G82.20

## 2014-06-29 HISTORY — DX: Other complications of anesthesia, initial encounter: T88.59XA

## 2014-06-29 HISTORY — DX: Major depressive disorder, single episode, unspecified: F32.9

## 2014-06-29 HISTORY — DX: Migraine, unspecified, not intractable, without status migrainosus: G43.909

## 2014-06-29 HISTORY — DX: Dissection of descending thoracic aorta: I71.012

## 2014-06-29 HISTORY — DX: Gastro-esophageal reflux disease without esophagitis: K21.9

## 2014-06-29 LAB — BASIC METABOLIC PANEL
ANION GAP: 16 — AB (ref 5–15)
BUN: 13 mg/dL (ref 6–23)
CALCIUM: 9.2 mg/dL (ref 8.4–10.5)
CO2: 21 mEq/L (ref 19–32)
Chloride: 106 mEq/L (ref 96–112)
Creatinine, Ser: 1.31 mg/dL — ABNORMAL HIGH (ref 0.50–1.10)
GFR calc Af Amer: 54 mL/min — ABNORMAL LOW (ref >90)
GFR calc non Af Amer: 47 mL/min — ABNORMAL LOW (ref >90)
GLUCOSE: 149 mg/dL — AB (ref 70–99)
Potassium: 2.8 mEq/L — CL (ref 3.7–5.3)
SODIUM: 143 meq/L (ref 137–147)

## 2014-06-29 LAB — CBC
HCT: 43.5 % (ref 36.0–46.0)
HEMOGLOBIN: 14.5 g/dL (ref 12.0–15.0)
MCH: 28.3 pg (ref 26.0–34.0)
MCHC: 33.3 g/dL (ref 30.0–36.0)
MCV: 84.8 fL (ref 78.0–100.0)
Platelets: 207 10*3/uL (ref 150–400)
RBC: 5.13 MIL/uL — ABNORMAL HIGH (ref 3.87–5.11)
RDW: 14.8 % (ref 11.5–15.5)
WBC: 7.8 10*3/uL (ref 4.0–10.5)

## 2014-06-29 LAB — I-STAT CHEM 8, ED
BUN: 13 mg/dL (ref 6–23)
CREATININE: 1.5 mg/dL — AB (ref 0.50–1.10)
Calcium, Ion: 1.12 mmol/L (ref 1.12–1.23)
Chloride: 107 mEq/L (ref 96–112)
Glucose, Bld: 154 mg/dL — ABNORMAL HIGH (ref 70–99)
HCT: 48 % — ABNORMAL HIGH (ref 36.0–46.0)
HEMOGLOBIN: 16.3 g/dL — AB (ref 12.0–15.0)
POTASSIUM: 2.7 meq/L — AB (ref 3.7–5.3)
Sodium: 146 mEq/L (ref 137–147)
TCO2: 22 mmol/L (ref 0–100)

## 2014-06-29 LAB — I-STAT TROPONIN, ED: Troponin i, poc: 0 ng/mL (ref 0.00–0.08)

## 2014-06-29 LAB — MRSA PCR SCREENING: MRSA by PCR: NEGATIVE

## 2014-06-29 MED ORDER — SODIUM CHLORIDE 0.9 % IV SOLN
250.0000 mL | INTRAVENOUS | Status: DC | PRN
  Administered 2014-07-01: 250 mL via INTRAVENOUS

## 2014-06-29 MED ORDER — HYDROMORPHONE HCL PF 1 MG/ML IJ SOLN
1.0000 mg | Freq: Once | INTRAMUSCULAR | Status: AC
  Administered 2014-06-29: 1 mg via INTRAVENOUS
  Filled 2014-06-29: qty 1

## 2014-06-29 MED ORDER — DOCUSATE SODIUM 100 MG PO CAPS
100.0000 mg | ORAL_CAPSULE | Freq: Every day | ORAL | Status: DC
  Administered 2014-07-01 – 2014-07-08 (×5): 100 mg via ORAL
  Filled 2014-06-29 (×10): qty 1

## 2014-06-29 MED ORDER — INSULIN ASPART 100 UNIT/ML ~~LOC~~ SOLN
2.0000 [IU] | SUBCUTANEOUS | Status: DC
  Administered 2014-06-30: 4 [IU] via SUBCUTANEOUS

## 2014-06-29 MED ORDER — ONDANSETRON HCL 4 MG/2ML IJ SOLN: 4.0000 mg | Freq: Three times a day (TID) | INTRAMUSCULAR | Status: DC | PRN

## 2014-06-29 MED ORDER — ONDANSETRON HCL 4 MG/2ML IJ SOLN
4.0000 mg | Freq: Four times a day (QID) | INTRAMUSCULAR | Status: DC | PRN
  Administered 2014-06-29 – 2014-07-07 (×5): 4 mg via INTRAVENOUS
  Filled 2014-06-29 (×5): qty 2

## 2014-06-29 MED ORDER — NICARDIPINE HCL IN NACL 20-0.86 MG/200ML-% IV SOLN
3.0000 mg/h | INTRAVENOUS | Status: DC
  Administered 2014-06-29: 5 mg/h via INTRAVENOUS
  Administered 2014-06-30: 1 mg/h via INTRAVENOUS
  Administered 2014-06-30: 3 mg/h via INTRAVENOUS
  Administered 2014-07-01: 6 mg/h via INTRAVENOUS
  Administered 2014-07-01: 4 mg/h via INTRAVENOUS
  Administered 2014-07-01: 3 mg/h via INTRAVENOUS
  Administered 2014-07-02 (×2): 5 mg/h via INTRAVENOUS
  Administered 2014-07-02: 6 mg/h via INTRAVENOUS
  Administered 2014-07-02 (×2): 5 mg/h via INTRAVENOUS
  Administered 2014-07-03: 3 mg/h via INTRAVENOUS
  Administered 2014-07-03: 3.5 mg/h via INTRAVENOUS
  Filled 2014-06-29 (×13): qty 200

## 2014-06-29 MED ORDER — LABETALOL HCL 5 MG/ML IV SOLN
0.5000 mg/min | INTRAVENOUS | Status: DC
  Administered 2014-06-30 (×2): 1 mg/min via INTRAVENOUS
  Administered 2014-07-01: 0.5 mg/min via INTRAVENOUS
  Administered 2014-07-02 – 2014-07-03 (×2): 1 mg/min via INTRAVENOUS
  Administered 2014-07-04: 3 mg/min via INTRAVENOUS
  Administered 2014-07-04: 2 mg/min via INTRAVENOUS
  Filled 2014-06-29 (×11): qty 100

## 2014-06-29 MED ORDER — POTASSIUM CHLORIDE 10 MEQ/100ML IV SOLN
10.0000 meq | Freq: Once | INTRAVENOUS | Status: AC
  Administered 2014-06-29: 10 meq via INTRAVENOUS
  Filled 2014-06-29: qty 100

## 2014-06-29 MED ORDER — DEXTROSE 5 % IV SOLN
0.5000 mg/min | INTRAVENOUS | Status: DC
  Administered 2014-06-29: 0.5 mg/min via INTRAVENOUS
  Filled 2014-06-29: qty 100

## 2014-06-29 MED ORDER — DOPAMINE-DEXTROSE 3.2-5 MG/ML-% IV SOLN: 3.0000 ug/kg/min | INTRAVENOUS | Status: DC

## 2014-06-29 MED ORDER — SODIUM CHLORIDE 0.9 % IV SOLN
500.0000 mL | Freq: Once | INTRAVENOUS | Status: AC | PRN
Start: 2014-06-29 — End: 2014-06-29

## 2014-06-29 MED ORDER — ONDANSETRON HCL 4 MG/2ML IJ SOLN
4.0000 mg | Freq: Once | INTRAMUSCULAR | Status: AC
  Administered 2014-06-29: 4 mg via INTRAVENOUS
  Filled 2014-06-29: qty 2

## 2014-06-29 MED ORDER — HYDROMORPHONE HCL PF 1 MG/ML IJ SOLN
1.0000 mg | INTRAMUSCULAR | Status: DC | PRN
  Administered 2014-06-30: 1 mg via INTRAVENOUS
  Filled 2014-06-29 (×2): qty 1

## 2014-06-29 MED ORDER — MIDAZOLAM HCL 2 MG/2ML IJ SOLN
INTRAMUSCULAR | Status: AC
  Administered 2014-06-29: 2 mg
  Filled 2014-06-29: qty 2

## 2014-06-29 MED ORDER — ALBUTEROL SULFATE (2.5 MG/3ML) 0.083% IN NEBU: 3.0000 mL | INHALATION_SOLUTION | RESPIRATORY_TRACT | Status: DC | PRN

## 2014-06-29 MED ORDER — IOHEXOL 350 MG/ML SOLN
100.0000 mL | Freq: Once | INTRAVENOUS | Status: AC | PRN
  Administered 2014-06-29: 100 mL via INTRAVENOUS

## 2014-06-29 MED ORDER — ALUM & MAG HYDROXIDE-SIMETH 200-200-20 MG/5ML PO SUSP
15.0000 mL | ORAL | Status: DC | PRN
  Administered 2014-06-30 – 2014-07-05 (×3): 30 mL via ORAL
  Filled 2014-06-29 (×3): qty 30

## 2014-06-29 MED ORDER — POTASSIUM CHLORIDE CRYS ER 20 MEQ PO TBCR
40.0000 meq | EXTENDED_RELEASE_TABLET | Freq: Once | ORAL | Status: AC
  Administered 2014-06-29: 40 meq via ORAL
  Filled 2014-06-29: qty 2

## 2014-06-29 NOTE — ED Provider Notes (Addendum)
CSN: [id number]     Arrival date & time 06/29/14  1550 History   First MD Initiated Contact with Patient 06/29/14 1558     Chief Complaint  Patient presents with  . Chest Pain      The history is provided by the patient and a relative.   Patient's family report developing acute onset chest pain today.  Patient had just about when she developed sudden severe retrosternal chest pain without radiation.  She feels short of breath.  She had nausea vomiting or out.  Initially EKG demonstrated sinus rhythm per EMS and then they report that she converted to atrial fibrillation with rapid ventricular response which now has since reverted to normal sinus rhythm.  Patient vomited in the emergency department room.  Patient describes the pain is severe at this time.  She looks uncomfortable.  She denies back pain.  She denies neck pain or jaw pain.  No prior history of cardiac disease.  No history of DVT or pulmonary embolism.  She and the family report that the patient was in the normal state health earlier today without recent illness or trauma.   Past Medical History  Diagnosis Date  . Hypertension   . Renal disorder    Past Surgical History  Procedure Laterality Date  . Cesarean section     No family history on file. History  Substance Use Topics  . Smoking status: Current Every Day Smoker -- 1.00 packs/day    Types: Cigarettes  . Smokeless tobacco: Never Used  . Alcohol Use: No   OB History   Grav Para Term Preterm Abortions TAB SAB Ect Mult Living                 Review of Systems  All other systems reviewed and are negative.     Allergies  Review of patient's allergies indicates no known allergies.  Home Medications   Prior to Admission medications   Medication Sig Start Date End Date Taking? Authorizing Provider  PRESCRIPTION MEDICATION Take 1 tablet by mouth daily. High blood pressure   Yes Historical Provider, MD   BP 158/88  Pulse 76  Temp(Src) 98.3 F (36.8 C)  (Oral)  Resp 23  SpO2 99% Physical Exam  Nursing note and vitals reviewed. Constitutional: [name], place, and time. She appears well-developed and well-nourished.  Uncomfortable appearing  HENT:  Head: Normocephalic and atraumatic.  Eyes: EOM are normal.  Neck: Normal range of motion.  Cardiovascular: Normal rate, regular rhythm and normal heart sounds.   Pulmonary/Chest: Effort normal and breath sounds normal.  Abdominal: Soft. She exhibits no distension. There is no tenderness.  Musculoskeletal: Normal range of motion.  Neurological: She is alert and oriented to person, place, and time.  Skin: Skin is warm and dry.  Psychiatric: She has a normal mood and affect. Judgment normal.    ED Course  Procedures (including critical care time) CRITICAL CARE Performed by: Lyanne CoAMPOS,Marsella Suman M Total critical care time: 35 Critical care time was exclusive of separately billable procedures and treating other patients. Critical care was necessary to treat or prevent imminent or life-threatening deterioration. Critical care was time spent personally by me on the following activities: development of treatment plan with patient and/or surrogate as well as nursing, discussions with consultants, evaluation of patient's response to treatment, examination of patient, obtaining history from patient or surrogate, ordering and performing treatments and interventions, ordering and review of laboratory studies, ordering and review of radiographic studies, pulse oximetry  and re-evaluation of patient's condition.   Labs Review Labs Reviewed  CBC - Abnormal; Notable for the following:    RBC 5.13 (*)    All other components within normal limits  BASIC METABOLIC PANEL  I-STAT TROPOININ, ED    Imaging Review Ct Angio Chest W/cm &/or Wo Cm  06/29/2014   CLINICAL DATA:  Chest/back pain, weakness, nausea. History of renal disorder.  EXAM: CT ANGIOGRAPHY CHEST AND ABDOMEN  TECHNIQUE: Multidetector  CT imaging of the chest and abdomen was performed using the standard protocol during bolus administration of intravenous contrast. Multiplanar CT image reconstructions and MIPs were obtained to evaluate the vascular anatomy.  CONTRAST:  OMNIPAQUE IOHEXOL 350 MG/ML SOLN  COMPARISON:  Chest radiograph dated 06/29/2014. Unenhanced CT abdomen pelvis dated 04/20/2009.  FINDINGS: CTA CHEST FINDINGS  No evidence of intramural hematoma on unenhanced CT.  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  Although not tailored for evaluation of the pulmonary arteries, there is no evidence of pulmonary embolism.  Mild dependent atelectasis at the lung bases. No suspicious pulmonary nodules. No pleural effusion or pneumothorax.  Visualized thyroid is unremarkable.  Heart is normal in size.  No pericardial effusion.  No suspicious mediastinal, hilar, or axillary lymphadenopathy.  Visualized osseous structures are within normal limits.  Review of the MIP images confirms the above findings.  CTA ABDOMEN FINDINGS  Aortic dissection extends to the aortic bifurcation.  Celiac artery, SMA, IMA, and bilateral renal arteries all arise from the true lumen and remain patent.  Scattered hepatic cysts measuring up to 2.1 cm. Liver is otherwise within normal limits.  Spleen, pancreas, and adrenal glands are within normal limits.  Gallbladder is unremarkable. No intrahepatic or extrahepatic ductal dilatation.  Numerous bilateral renal cysts, compatible with polycystic renal disease. No hydronephrosis.  Visualized bowel is unremarkable.  No abdominal ascites.  No suspicious abdominal lymphadenopathy.  Visualized osseous structures are within normal limits.  Review of the MIP images confirms the above findings.  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  Celiac artery, SMA, IMA, and bilateral renal  arteries all arise from the true lumen and remain patent.  No evidence of intramural hematoma.  Additional ancillary findings as above.  These results were called by telephone at the time of interpretation on 06/29/2014 at 6:20 pm to Dr. Azalia Bilis , who verbally acknowledged these results.   Electronically Signed   By: Charline Bills M.D.   On: 06/29/2014 18:34   Ct Angio Abdomen W/cm &/or Wo Contrast  06/29/2014   CLINICAL DATA:  Chest/back pain, weakness, nausea. History of renal disorder.  EXAM: CT ANGIOGRAPHY CHEST AND ABDOMEN  TECHNIQUE: Multidetector CT imaging of the chest and abdomen was performed using the standard protocol during bolus administration of intravenous contrast. Multiplanar CT image reconstructions and MIPs were obtained to evaluate the vascular anatomy.  CONTRAST:  OMNIPAQUE IOHEXOL 350 MG/ML SOLN  COMPARISON:  Chest radiograph dated 06/29/2014. Unenhanced CT abdomen pelvis dated 04/20/2009.  FINDINGS: CTA CHEST FINDINGS  No evidence of intramural hematoma on unenhanced CT.  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  Although not tailored for evaluation of the pulmonary arteries, there is no evidence of pulmonary embolism.  Mild dependent atelectasis at the lung bases. No suspicious pulmonary nodules. No pleural effusion or pneumothorax.  Visualized thyroid  is unremarkable.  Heart is normal in size.  No pericardial effusion.  No suspicious mediastinal, hilar, or axillary lymphadenopathy.  Visualized osseous structures are within normal limits.  Review of the MIP images confirms the above findings.  CTA ABDOMEN FINDINGS  Aortic dissection extends to the aortic bifurcation.  Celiac artery, SMA, IMA, and bilateral renal arteries all arise from the true lumen and remain patent.  Scattered hepatic cysts measuring up to 2.1 cm. Liver is otherwise within normal limits.  Spleen, pancreas, and adrenal glands are within normal limits.   Gallbladder is unremarkable. No intrahepatic or extrahepatic ductal dilatation.  Numerous bilateral renal cysts, compatible with polycystic renal disease. No hydronephrosis.  Visualized bowel is unremarkable.  No abdominal ascites.  No suspicious abdominal lymphadenopathy.  Visualized osseous structures are within normal limits.  Review of the MIP images confirms the above findings.  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  Celiac artery, SMA, IMA, and bilateral renal arteries all arise from the true lumen and remain patent.  No evidence of intramural hematoma.  Additional ancillary findings as above.  These results were called by telephone at the time of interpretation on 06/29/2014 at 6:20 pm to Dr. Azalia Bilis , who verbally acknowledged these results.   Electronically Signed   By: Charline Bills M.D.   On: 06/29/2014 18:34   Dg Chest Port 1 View  06/29/2014   CLINICAL DATA:  Chest pain  EXAM: PORTABLE CHEST - 1 VIEW  COMPARISON:  04/13/2010  FINDINGS: Lungs are clear.  No pleural effusion or pneumothorax.  The heart is normal in size.  IMPRESSION: No evidence of acute cardiopulmonary disease.   Electronically Signed   By: Charline Bills M.D.   On: 06/29/2014 17:16  I personally reviewed the imaging tests through PACS system I reviewed available ER/hospitalization records through the EMR    EKG Interpretation   Date/Time:  Saturday June 29 2014 16:01:19 EDT Ventricular Rate:  102 PR Interval:  127 QRS Duration: 90 QT Interval:  431 QTC Calculation: 561 R Axis:   73 Text Interpretation:  Sinus tachycardia Probable left atrial enlargement  Probable left ventricular hypertrophy Repol abnrm suggests ischemia,  lateral leads Prolonged QT interval Confirmed by Khia Dieterich  MD, Caryn Bee ([id number])  on 06/29/2014 5:57:08 PM      MDM   Final diagnoses:  Aortic dissection distal to left subclavian     Patient uncomfortable on arrival.  Concern for dissection.  Chest x-ray without significant abnormalities.  Patient ordered to undergo CT angiogram chest, abdomen, pelvis.  Creatinine pending.  Pain will be managed at this time  6:46 PM Patient appears to have an acute aortic dissection clinically.  This appears radiographically to be a type B aortic dissection with major end organ blood flow all coming from the true lumen.  Likely medical management.  Patient is not extremely tachycardic or hypertensive at this time.  I'll discuss the case with CT surgery and plan will be to admit the patient to the hospital for ongoing management and observation.  She still is having discomfort and pain at this time.  6:52 PM Spoke with Dr Tyrone Sage, CT surgery, who will evaluate in the ER. Requests admission to ICU with PCCM. He will consult and follow  8:01 PM Noted by CT surgery to have lower extremity flacidity concerning for cord ischemia.  I feel as though she was moving her feet on arrival  but i can't be certain. Plan will be for epidural drain at this time. Dr Tyrone Sage will arrange with anesthesia.  We'll continue to manage heart rate and blood pressure aggressively with IV labetalol.  Patient will need a arterial line on arrival to the ICU.  Plan will be to get her to the ICU at this time and have anesthesia place a drain at the bedside in the ICU  Lyanne Co, MD 06/29/14 1610  Lyanne Co, MD 06/29/14 2007

## 2014-06-29 NOTE — Consult Note (Signed)
[address]       [place] [id number]             [phone]        [facility]ene J [name] Medical Record #[id number] Date of Birth: 1966/02/25  Referring:  Dr Patria Maneampos- ER Primary Care: No PCP Per Patient Renal: Dr [place]acy [place]  Chief Complaint:    Chief Complaint  Patient presents with  . Chest Pain    History of Present Illness:     Patient's family reports developing she starting complaining of anterior chest pain about 2:20 today.  Patient had been sitting on sofa most of day when  when she developed sudden severe retrosternal chest pain without radiation. She feels short of breath. She had nausea no vomiting.  Initially EKG demonstrated sinus rhythm per EMS and then they report that she converted to atrial fibrillation with rapid ventricular response which now has since reverted to normal sinus rhythm.  Patient describes the pain is severe at this time. She looks uncomfortable. She denies back pain. She denies neck pain or jaw pain. No prior history of cardiac disease. No history of DVT or pulmonary embolism. She and the family report that the patient was in the normal state health earlier today without recent illness or trauma.  CTA of chest was done in the ER and reported as Type B dissection:  On exam of the patient she was unable to move her lower extremities. Family notes when she walks a lot her legs get weak but this morning she was walking around the house.   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   Zubrod Score: At the time of surgery this patient's most appropriate activity status/level should be described as: []     0    Normal activity, no symptoms [x]     1    Restricted in physical strenuous activity but ambulatory, able to do out light work []     2    Ambulatory and capable of self care, unable to do work activities,  up and about                 more than 50%  Of the time                            []     3    Only limited self care, in bed greater than 50% of waking hours []     4    Completely disabled, no self care, confined to bed or chair []     5    Moribund  Past Medical History  Diagnosis Date  . Hypertension   . Renal disorder- polycystic kidney disease      Past Surgical History  Procedure Laterality Date  . Cesarean section      History  Smoking status  . Current Every Day Smoker -- 1.00 packs/day  . Types: Cigarettes  Smokeless tobacco  . Never Used   History  Alcohol Use No      No Known Allergies  Current Facility-Administered Medications  Medication Dose Route Frequency Provider Last Rate Last Dose  . labetalol (NORMODYNE,TRANDATE) 500 mg in dextrose 5 % 125 mL (4 mg/mL) infusion  0.5-3 mg/min Intravenous Titrated Lyanne CoKevin M Campos, MD 7.5 mL/hr at 06/29/14 1937 0.5 mg/min at 06/29/14 [id number]  Current Outpatient Prescriptions  Medication Sig Dispense Refill  . PRESCRIPTION MEDICATION Take 1 tablet by mouth daily. High blood pressure         Family History : father died 63's brain aneurysm, mother alive, daughter had spinal meningitis, one sister died age 56 with spinal meningitis    Review of Systems:     Cardiac Review of Systems: Y or N  Chest Pain [    y]  Resting SOB [place].Etienne ] Exertional SOB  [ y ]  Orthopnea [ n ]   Pedal Edema [ n  ]    Palpitations [n  ] Syncope  [n  ]   Presyncope [n   ]  General Review of Systems: [Y] = yes [  ]=no Constitional: recent weight change [n  ]; anorexia [  ]; fatigue [n  ]; nausea [  ]; night sweats [  ]; fever [  ]; or chills [  ]                                                               Dental: poor dentition[  ]; Last Dentist visit:   Eye : blurred vision [  n]; diplopia [n   ]; vision changes [n  ];  Amaurosis fugax[n ]; Resp: cough [  ];  wheezing[  ];  hemoptysis[  ]; shortness of breath[  ]; paroxysmal nocturnal dyspnea[n  ];  dyspnea on exertion[n  ]; or orthopnea[  n];  GI:  gallstones[  ], vomiting[ y ];  dysphagia[  ]; melena[  ];  hematochezia [  ]; heartburn[  ];   Hx of  Colonoscopy[n  ]; GU: kidney stones [  ]; hematuria[  ];   dysuria [  ];  nocturia[  ];  history of     obstruction [  ]; urinary frequency [  ]             Skin: rash, swelling[  ];, hair loss[  ];  peripheral edema[  ];  or itching[  ]; Musculosketetal: myalgias[  ];  joint swelling[  ];  joint erythema[  ];  joint pain[  ];  back pain[  ];  Heme/Lymph: bruising[  ];  bleeding[  ];  anemia[  ];  Neuro: TIA[  ];  headaches[  ];  stroke[  ];  vertigo[  ];  seizures[ n ];   paresthesias[y  ];  difficulty walking[y  ];  Psych:depression[  ]; anxiety[  ];leaning disability  Endocrine: diabetes[n  ];  thyroid dysfunction[ n ];  Immunizations: Flu Milo.Brash ]; Pneumococcal[ n ];  Other:  Physical Exam: BP 137/73  Pulse 57  Temp(Src) 98.3 F (36.8 C) (Oral)  Resp 14  SpO2 90%  General appearance: alert, cooperative, appears older than stated age, fatigued, moderate distress and slowed mentation Neurologic: good strength on upper extremities , sensation present in lower extremities but unable to move feet or bend knees ,unable to hold legs up Heart: regular rate and rhythm, S1, S2 normal, no murmur, click, rub or gallop Lungs: clear to auscultation bilaterally Abdomen: soft, non-tender; bowel sounds normal; no masses,  no organomegaly Extremities: extremities normal, atraumatic, no cyanosis or edema and Homans sign is negative, no sign of DVT no carotid bruits  Full palpable femoral, dp  and pt pulses bilaterial  No evidence of lower extremity ischemia  Diagnostic Studies & Laboratory data:     Recent Radiology Findings:   Ct Angio Chest W/cm &/or Wo Cm  06/29/2014   CLINICAL DATA:  Chest/back pain, weakness, nausea. History of renal disorder.  EXAM: CT ANGIOGRAPHY CHEST AND ABDOMEN  TECHNIQUE: Multidetector CT imaging of the chest and abdomen  was performed using the standard protocol during bolus administration of intravenous contrast. Multiplanar CT image reconstructions and MIPs were obtained to evaluate the vascular anatomy.  CONTRAST:  OMNIPAQUE IOHEXOL 350 MG/ML SOLN  COMPARISON:  Chest radiograph dated 06/29/2014. Unenhanced CT abdomen pelvis dated 04/20/2009.  FINDINGS: CTA CHEST FINDINGS  No evidence of intramural hematoma on unenhanced CT.  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  Although not tailored for evaluation of the pulmonary arteries, there is no evidence of pulmonary embolism.  Mild dependent atelectasis at the lung bases. No suspicious pulmonary nodules. No pleural effusion or pneumothorax.  Visualized thyroid is unremarkable.  Heart is normal in size.  No pericardial effusion.  No suspicious mediastinal, hilar, or axillary lymphadenopathy.  Visualized osseous structures are within normal limits.  Review of the MIP images confirms the above findings.  CTA ABDOMEN FINDINGS  Aortic dissection extends to the aortic bifurcation.  Celiac artery, SMA, IMA, and bilateral renal arteries all arise from the true lumen and remain patent.  Scattered hepatic cysts measuring up to 2.1 cm. Liver is otherwise within normal limits.  Spleen, pancreas, and adrenal glands are within normal limits.  Gallbladder is unremarkable. No intrahepatic or extrahepatic ductal dilatation.  Numerous bilateral renal cysts, compatible with polycystic renal disease. No hydronephrosis.  Visualized bowel is unremarkable.  No abdominal ascites.  No suspicious abdominal lymphadenopathy.  Visualized osseous structures are within normal limits.  Review of the MIP images confirms the above findings.  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  Celiac artery, SMA, IMA, and bilateral renal arteries all arise from the true  lumen and remain patent.  No evidence of intramural hematoma.  Additional ancillary findings as above.  These results were called by telephone at the time of interpretation on 06/29/2014 at 6:20 pm to Dr. Azalia Bilis , who verbally acknowledged these results.   Electronically Signed   By: Charline Bills M.D.   On: 06/29/2014 18:34   Ct Angio Abdomen W/cm &/or Wo Contrast  06/29/2014   CLINICAL DATA:  Chest/back pain, weakness, nausea. History of renal disorder.  EXAM: CT ANGIOGRAPHY CHEST AND ABDOMEN  TECHNIQUE: Multidetector CT imaging of the chest and abdomen was performed using the standard protocol during bolus administration of intravenous contrast. Multiplanar CT image reconstructions and MIPs were obtained to evaluate the vascular anatomy.  CONTRAST:  OMNIPAQUE IOHEXOL 350 MG/ML SOLN  COMPARISON:  Chest radiograph dated 06/29/2014. Unenhanced CT abdomen pelvis dated 04/20/2009.  FINDINGS: CTA CHEST FINDINGS  No evidence of intramural hematoma on unenhanced CT.  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  Although not tailored for evaluation of the pulmonary arteries, there is no evidence of pulmonary embolism.  Mild dependent atelectasis at the lung bases. No suspicious pulmonary nodules. No pleural effusion or pneumothorax.  Visualized thyroid is unremarkable.  Heart is normal in size.  No pericardial effusion.  No suspicious mediastinal, hilar, or axillary lymphadenopathy.  Visualized osseous structures are within normal limits.  Review of the MIP images confirms the above findings.  CTA ABDOMEN FINDINGS  Aortic dissection extends to the aortic bifurcation.  Celiac artery, SMA, IMA, and bilateral renal arteries all arise from the true lumen and remain patent.  Scattered hepatic cysts measuring up to 2.1 cm. Liver is otherwise within normal limits.  Spleen, pancreas, and adrenal glands are within normal limits.  Gallbladder is unremarkable. No  intrahepatic or extrahepatic ductal dilatation.  Numerous bilateral renal cysts, compatible with polycystic renal disease. No hydronephrosis.  Visualized bowel is unremarkable.  No abdominal ascites.  No suspicious abdominal lymphadenopathy.  Visualized osseous structures are within normal limits.  Review of the MIP images confirms the above findings.  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  Celiac artery, SMA, IMA, and bilateral renal arteries all arise from the true lumen and remain patent.  No evidence of intramural hematoma.  Additional ancillary findings as above.  These results were called by telephone at the time of interpretation on 06/29/2014 at 6:20 pm to Dr. Azalia Bilis , who verbally acknowledged these results.   Electronically Signed   By: Charline Bills M.D.   On: 06/29/2014 18:34   Dg Chest Port 1 View  06/29/2014   CLINICAL DATA:  Chest pain  EXAM: PORTABLE CHEST - 1 VIEW  COMPARISON:  04/13/2010  FINDINGS: Lungs are clear.  No pleural effusion or pneumothorax.  The heart is normal in size.  IMPRESSION: No evidence of acute cardiopulmonary disease.   Electronically Signed   By: Charline Bills M.D.   On: 06/29/2014 17:16      Recent Lab Findings: Lab Results  Component Value Date   WBC 7.8 06/29/2014   HGB 16.3* 06/29/2014   HCT 48.0* 06/29/2014   PLT 207 06/29/2014   GLUCOSE 154* 06/29/2014   ALT 13 04/13/2010   AST 16 04/13/2010   NA 146 06/29/2014   K 2.7* 06/29/2014   CL 107 06/29/2014   CREATININE 1.50* 06/29/2014   BUN 13 06/29/2014   CO2 21 06/29/2014   TSH 1.752 Test methodology is 3rd generation TSH 12/02/2007   INR 1.0 12/02/2007     Assessment / Plan:    1/ Acute Type III, aortic dissection/ intramural hematoma involving the aorta from distal to left subclavian to aortic bifurcation, with acute presentation with spinal cord ischemia but without evidence of other vascular mal  perfusion. Films reviewed with Dr Patria Mane and radiology.- immediate surgical intervention or stenting will not correct current presentation. Plan immediate placement of spinal drain, icu monitoring for bp, renal function and spinal fluid drainage.  2/Neuro consult done - agree with above plan  3/hypertension 4/hx of learning disability 5/polycystic kidney disease- cr 1.5  6/tobacco abuse        Delight Ovens MD      [address] [place] [place] [address] Office [phone]   Beeper [phone]  06/29/2014 7:56 PM

## 2014-06-29 NOTE — Op Note (Signed)
Insertion of Spinal Drain  I was asked by Dr Tyrone SageGerhardt to place a spinal drain into Ms Charlotte [name]. 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 She was noted to have leg weakness and a spinal drain is indicated to attempt to improve blood flow to her spinal cord. I explained the procedure and urgency to the patient and her family. I obtained informed consent from the patient and then sedated her with 2mg  Versed. I turned her on her side and prepped her lower back with Hibiclens. I injected 3cc of 2%xylocaine locally and passed the 12 gauge Touhy needle into the CSF via a paramedian approach at the L3-L4 level. Good CSF flow was obtained. I passed a styletted catheter 5cm into the CSF and removed the stylet. The catheter was hooked up steriley to an enclosed system for drainage and measurement.  She tolerated the procedure well.  Lonia SkinnerKevin D Bo Teicher MD  Start:21:15 End: 21:45

## 2014-06-29 NOTE — ED Notes (Signed)
Pt reports to the ED via [facility] for eval of constant sharp midsternal CP. Pt reports the pain does not radiate. Associated symptoms include SOB, N/V, and diaphoresis. Pt reports she was standing when the pain b egan and the pain got worse when she sat down and is worse to palpation. En route on the 12 lead pt was initially NSR and she received 324 of ASA and 2 nitros. Then she converted to A. Fib with RVR then converted to sinus brady in the 50s-60. Pt NSR to sinus tachycardia on arrival. Pt breathing rapidly and clutching her chest she appears very anxious. Pt had 1 episode of clear emesis en route. After the nitros her pain went from a 8/10 to a 10/10. Pt reports it is a 10/10 at this time. Pt also received 4 mg of zofran en route for her N/V. Pt A&Ox4, resp e/u, and skin warm and dry.

## 2014-06-29 NOTE — ED Notes (Signed)
Dr.Campos shown results Istat Chem8.

## 2014-06-29 NOTE — Consult Note (Signed)
Reason for Consult: New-onset of weakness of lower extremities associated with aortic dissection.  HPI:                                                                                                                                          Charlotte [name] is an 48 y.o. female history of hypertension and kidney disease or to the emergency room following onset of chest pain with associated nausea and vomiting as well as diaphoresis and shortness of breath. She had no radiation chest pain to her neck or arm. Patient was noted to be unable to move her lower extremities with no difficulty with use of upper extremities. She reportedly was able to walk as usual earlier today. CT angiogram of chest and abdomen showed type B aortic dissection arising just distal to the origin of the left subclavian artery and extending to the aortic bifurcation. Neurology consultation was obtained for evaluation of likely acute thoracic cord ischemia. Patient was noted to be in and out of atrial fibrillation, with no prior documentation of cardiac dysrhythmia.  Past Medical History  Diagnosis Date  . Hypertension   . Renal disorder     Past Surgical History  Procedure Laterality Date  . Cesarean section      No family history on file.  Social History:  reports that she has been smoking Cigarettes.  She has been smoking about 1.00 pack per day. She has never used smokeless tobacco. She reports that she does not drink alcohol or use illicit drugs.  No Known Allergies  MEDICATIONS:                                                                                                                     I have reviewed the patient's current medications.   ROS:  History obtained from patient's daughter  General ROS: negative for - chills, fatigue, fever, night sweats, weight gain or weight  loss Psychological ROS: Chronic mild mental retardation Ophthalmic ROS: negative for - blurry vision, double vision, eye pain or loss of vision ENT ROS: negative for - epistaxis, nasal discharge, oral lesions, sore throat, tinnitus or vertigo Allergy and Immunology ROS: negative for - hives or itchy/watery eyes Hematological and Lymphatic ROS: negative for - bleeding problems, bruising or swollen lymph nodes Endocrine ROS: negative for - galactorrhea, hair pattern changes, polydipsia/polyuria or temperature intolerance Respiratory ROS: negative for - cough, hemoptysis, shortness of breath or wheezing Cardiovascular ROS: As noted in history of present illness Gastrointestinal ROS: negative for - abdominal pain, diarrhea, hematemesis, nausea/vomiting or stool incontinence Genito-Urinary ROS: negative for - dysuria, hematuria, incontinence or urinary frequency/urgency Musculoskeletal ROS: negative for - joint swelling or muscular weakness Neurological ROS: as noted in HPI Dermatological ROS: negative for rash and skin lesion changes   Blood pressure 135/70, pulse 52, temperature 98.3 F (36.8 C), temperature source Oral, resp. rate 17, SpO2 94.00%.   Neurologic Examination:                                                                                                      Mental Status: Alert, oriented, thought content appropriate.  No aphasia. Able to follow commands without difficulty. Cranial Nerves: II-Visual fields were normal. III/IV/VI- Extraocular movements were full and conjugate.    VII- no facial weakness. VIII-normal. X-normal speech. Motor: Normal strength of upper extremities proximally and distally; paralysis of lower extremities with no intact voluntary movement. Sensory: Normal throughout, including tactile sensation in lower extremities compared to upper extremities. Deep Tendon Reflexes: 1+ and symmetric in upper extremities and absent in lower extremities. Plantars:  Mute bilaterally  No results found for this basename: cbc, bmp, coags, chol, tri, ldl, hga1c    Results for orders placed during the hospital encounter of 06/29/14 (from the past 48 hour(s))  CBC     Status: Abnormal   Collection Time    06/29/14  4:20 PM      Result Value Ref Range   WBC 7.8  4.0 - 10.5 K/uL   RBC 5.13 (*) 3.87 - 5.11 MIL/uL   Hemoglobin 14.5  12.0 - 15.0 g/dL   HCT 43.5  36.0 - 46.0 %   MCV 84.8  78.0 - 100.0 fL   MCH 28.3  26.0 - 34.0 pg   MCHC 33.3  30.0 - 36.0 g/dL   RDW 14.8  11.5 - 15.5 %   Platelets 207  150 - 400 K/uL  BASIC METABOLIC PANEL     Status: Abnormal   Collection Time    06/29/14  4:20 PM      Result Value Ref Range   Sodium 143  137 - 147 mEq/L   Potassium 2.8 (*) 3.7 - 5.3 mEq/L   Comment: CRITICAL RESULT CALLED TO, READ BACK BY AND VERIFIED WITH:     B.TAYLOR,RN 1711 06/29/14 M.CAMPBELL   Chloride 106  96 - 112 mEq/L  CO2 21  19 - 32 mEq/L   Glucose, Bld 149 (*) 70 - 99 mg/dL   BUN 13  6 - 23 mg/dL   Creatinine, Ser 1.31 (*) 0.50 - 1.10 mg/dL   Calcium 9.2  8.4 - 10.5 mg/dL   GFR calc non Af Amer 47 (*) >90 mL/min   GFR calc Af Amer 54 (*) >90 mL/min   Comment: (NOTE)     The eGFR has been calculated using the CKD EPI equation.     This calculation has not been validated in all clinical situations.     eGFR's persistently <90 mL/min signify possible Chronic Kidney     Disease.   Anion gap 16 (*) 5 - 15  I-STAT TROPOININ, ED     Status: None   Collection Time    06/29/14  4:49 PM      Result Value Ref Range   Troponin i, poc 0.00  0.00 - 0.08 ng/mL   Comment 3            Comment: Due to the release kinetics of cTnI,     a negative result within the first hours     of the onset of symptoms does not rule out     myocardial infarction with certainty.     If myocardial infarction is still suspected,     repeat the test at appropriate intervals.  I-STAT CHEM 8, ED     Status: Abnormal   Collection Time    06/29/14  4:53 PM       Result Value Ref Range   Sodium 146  137 - 147 mEq/L   Potassium 2.7 (*) 3.7 - 5.3 mEq/L   Chloride 107  96 - 112 mEq/L   BUN 13  6 - 23 mg/dL   Creatinine, Ser 1.50 (*) 0.50 - 1.10 mg/dL   Glucose, Bld 154 (*) 70 - 99 mg/dL   Calcium, Ion 1.12  1.12 - 1.23 mmol/L   TCO2 22  0 - 100 mmol/L   Hemoglobin 16.3 (*) 12.0 - 15.0 g/dL   HCT 48.0 (*) 36.0 - 46.0 %   Comment NOTIFIED PHYSICIAN      Ct Angio Chest W/cm &/or Wo Cm  06/29/2014   ADDENDUM REPORT: 06/29/2014 20:00  ADDENDUM: Case was reviewed with Dr. Servando Snare at the time of addendum.  A 4 mm penetrating atherosclerotic ulcer is possible along the posterior aspect of the descending thoracic aorta (series 701/image 41). Although very subtle, a slight hyperdense crescent may be present on unenhanced imaging, reflecting intramural hematoma.  As such, while the findings remain compatible with acute aortic syndrome, this may reflect sequela of penetrating atherosclerotic ulcer rather than true aortic dissection.   Electronically Signed   By: Julian Hy M.D.   On: 06/29/2014 20:00   06/29/2014   CLINICAL DATA:  Chest/back pain, weakness, nausea. History of renal disorder.  EXAM: CT ANGIOGRAPHY CHEST AND ABDOMEN  TECHNIQUE: Multidetector CT imaging of the chest and abdomen was performed using the standard protocol during bolus administration of intravenous contrast. Multiplanar CT image reconstructions and MIPs were obtained to evaluate the vascular anatomy.  CONTRAST:  165m OMNIPAQUE IOHEXOL 350 MG/ML SOLN  COMPARISON:  Chest radiograph dated 06/29/2014. Unenhanced CT abdomen pelvis dated 04/20/2009.  FINDINGS: CTA CHEST FINDINGS  No evidence of intramural hematoma on unenhanced CT.  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  Although not tailored for evaluation of the pulmonary  arteries, there is no evidence of pulmonary embolism.  Mild dependent atelectasis at the lung bases. No suspicious  pulmonary nodules. No pleural effusion or pneumothorax.  Visualized thyroid is unremarkable.  Heart is normal in size.  No pericardial effusion.  No suspicious mediastinal, hilar, or axillary lymphadenopathy.  Visualized osseous structures are within normal limits.  Review of the MIP images confirms the above findings.  CTA ABDOMEN FINDINGS  Aortic dissection extends to the aortic bifurcation.  Celiac artery, SMA, IMA, and bilateral renal arteries all arise from the true lumen and remain patent.  Scattered hepatic cysts measuring up to 2.1 cm. Liver is otherwise within normal limits.  Spleen, pancreas, and adrenal glands are within normal limits.  Gallbladder is unremarkable. No intrahepatic or extrahepatic ductal dilatation.  Numerous bilateral renal cysts, compatible with polycystic renal disease. No hydronephrosis.  Visualized bowel is unremarkable.  No abdominal ascites.  No suspicious abdominal lymphadenopathy.  Visualized osseous structures are within normal limits.  Review of the MIP images confirms the above findings.  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  Celiac artery, SMA, IMA, and bilateral renal arteries all arise from the true lumen and remain patent.  No evidence of intramural hematoma.  Additional ancillary findings as above.  These results were called by telephone at the time of interpretation on 06/29/2014 at 6:20 pm to Dr. Jola Schmidt , who verbally acknowledged these results.  Electronically Signed: By: Julian Hy M.D. On: 06/29/2014 18:34   Ct Angio Abdomen W/cm &/or Wo Contrast  06/29/2014   ADDENDUM REPORT: 06/29/2014 20:00  ADDENDUM: Case was reviewed with Dr. Servando Snare at the time of addendum.  A 4 mm penetrating atherosclerotic ulcer is possible along the posterior aspect of the descending thoracic aorta (series 701/image 41). Although very subtle, a slight hyperdense crescent may  be present on unenhanced imaging, reflecting intramural hematoma.  As such, while the findings remain compatible with acute aortic syndrome, this may reflect sequela of penetrating atherosclerotic ulcer rather than true aortic dissection.   Electronically Signed   By: Julian Hy M.D.   On: 06/29/2014 20:00   06/29/2014   CLINICAL DATA:  Chest/back pain, weakness, nausea. History of renal disorder.  EXAM: CT ANGIOGRAPHY CHEST AND ABDOMEN  TECHNIQUE: Multidetector CT imaging of the chest and abdomen was performed using the standard protocol during bolus administration of intravenous contrast. Multiplanar CT image reconstructions and MIPs were obtained to evaluate the vascular anatomy.  CONTRAST:  11m OMNIPAQUE IOHEXOL 350 MG/ML SOLN  COMPARISON:  Chest radiograph dated 06/29/2014. Unenhanced CT abdomen pelvis dated 04/20/2009.  FINDINGS: CTA CHEST FINDINGS  No evidence of intramural hematoma on unenhanced CT.  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  Although not tailored for evaluation of the pulmonary arteries, there is no evidence of pulmonary embolism.  Mild dependent atelectasis at the lung bases. No suspicious pulmonary nodules. No pleural effusion or pneumothorax.  Visualized thyroid is unremarkable.  Heart is normal in size.  No pericardial effusion.  No suspicious mediastinal, hilar, or axillary lymphadenopathy.  Visualized osseous structures are within normal limits.  Review of the MIP images confirms the above findings.  CTA ABDOMEN FINDINGS  Aortic dissection extends to the aortic bifurcation.  Celiac artery, SMA, IMA, and bilateral renal arteries all arise from the true lumen and remain patent.  Scattered hepatic cysts measuring up to 2.1 cm.  Liver is otherwise within normal limits.  Spleen, pancreas, and adrenal glands are within normal limits.  Gallbladder is unremarkable. No intrahepatic or extrahepatic ductal dilatation.  Numerous bilateral  renal cysts, compatible with polycystic renal disease. No hydronephrosis.  Visualized bowel is unremarkable.  No abdominal ascites.  No suspicious abdominal lymphadenopathy.  Visualized osseous structures are within normal limits.  Review of the MIP images confirms the above findings.  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  Celiac artery, SMA, IMA, and bilateral renal arteries all arise from the true lumen and remain patent.  No evidence of intramural hematoma.  Additional ancillary findings as above.  These results were called by telephone at the time of interpretation on 06/29/2014 at 6:20 pm to Dr. Jola Schmidt , who verbally acknowledged these results.  Electronically Signed: By: Julian Hy M.D. On: 06/29/2014 18:34   Dg Chest Port 1 View  06/29/2014   CLINICAL DATA:  Chest pain  EXAM: PORTABLE CHEST - 1 VIEW  COMPARISON:  04/13/2010  FINDINGS: Lungs are clear.  No pleural effusion or pneumothorax.  The heart is normal in size.  IMPRESSION: No evidence of acute cardiopulmonary disease.   Electronically Signed   By: Julian Hy M.D.   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 Patient was also noted to have intermittent atrial fibrillation. As such spinal cord ischemic event could be secondary to embolic phenomenon possible cardiac source.  Recommendations: 1. Agree with acute management with lumbar CSF drain placement. 2. MRI of thoracic and lumbar spine without contrast. 3. 2-D echocardiogram 4. Physical therapy intervention is feasible 5. Will defer intervention with antiplatelet or anticoagulation due to acute aortic dissection.  C.R.  Nicole Kindred, MD [facility] [phone]  06/29/2014, 8:07 PM

## 2014-06-29 NOTE — H&P (Signed)
PULMONARY / CRITICAL CARE MEDICINE   Name: Charlotte [name] MRN: [id number] DOB: 1966-11-06    ADMISSION DATE:  06/29/2014 CONSULTATION DATE:    REFERRING MD :    CHIEF COMPLAINT:  Inability to walk, chest pain  INITIAL PRESENTATION:   STUDIES:  CTA chest & abdomen 8/8  SIGNIFICANT EVENTS: Lumbar drain 8/8   HISTORY OF PRESENT ILLNESS:  This is a patient with known HTN, CKD, learning disability and depression. Yesterday she was noted to start having leg weakness and difficulty ambulating.  Today she started complaining of anterior chest pain. Patient had been sitting on sofa most of day when when she developed sudden severe retrosternal chest pain without radiation. She feels short of breath. She had nausea no vomiting. Initially EKG demonstrated sinus rhythm per EMS and then they report that she converted to atrial fibrillation with rapid ventricular response which now has since reverted to normal sinus rhythm. Patient describes the pain is severe at this time. Also has bilateral leg pain and numbness. She denies back pain. She denies neck pain or jaw pain. No prior history of cardiac disease. No history of DVT or pulmonary embolism. She and the family report that the patient was in the normal state health earlier today without recent illness or trauma.  CTA of chest was done in the ER and reported as Type B dissection. Her weakness also is concerning for possible spinal cord infarct.  She is a smoker 1 ppd for 30-40 years, quit EtOH, no more drug abuse. No recent travel, no recent trauma.   PAST MEDICAL HISTORY :  Past Medical History  Diagnosis Date  . Hypertension   . Renal disorder    Past Surgical History  Procedure Laterality Date  . Cesarean section     Prior to Admission medications   Medication Sig Start Date End Date Taking? Authorizing Provider  amLODipine (NORVASC) 10 MG tablet Take 10 mg by mouth daily.   Yes Historical Provider, MD  aspirin-acetaminophen-caffeine  (EXCEDRIN MIGRAINE) [phone] MG per tablet Take 1 tablet by mouth every 6 (six) hours as needed for headache.   Yes Historical Provider, MD  lisinopril-hydrochlorothiazide (PRINZIDE,ZESTORETIC) 20-12.5 MG per tablet Take 1 tablet by mouth daily.   Yes Historical Provider, MD  naproxen sodium (ANAPROX) 220 MG tablet Take 220 mg by mouth daily as needed (for pain).   Yes Historical Provider, MD  OLANZapine (ZYPREXA) 5 MG tablet Take 5 mg by mouth at bedtime.    Historical Provider, MD  QUEtiapine Fumarate (SEROQUEL PO) Take 1 tablet by mouth at bedtime.    Historical Provider, MD  Sertraline HCl (ZOLOFT PO) Take 1 tablet by mouth daily.    Historical Provider, MD   Allergies  Allergen Reactions  . Seroquel [Quetiapine] Other (See Comments)    Night sweats, dizziness and possible confusion  . Olanzapine Nausea And Vomiting    FAMILY HISTORY:  No family history on file. SOCIAL HISTORY:  reports that she has been smoking Cigarettes.  She has been smoking about 1.00 pack per day. She has never used smokeless tobacco. She reports that she does not drink alcohol or use illicit drugs.  REVIEW OF SYSTEMS:  No N/V, no abd pain, no fever, no diarrhea, no dysuria, no rash, no photosensitivity, no vision changes, no headache, no neck pain, no dyspnea, no productive cough  SUBJECTIVE:   VITAL SIGNS: Temp:  [98.3 F (36.8 C)] 98.3 F (36.8 C) (08/08 1601) Pulse Rate:  [50-101] 58 (08/08 2200) Resp:  [  12-34] 22 (08/08 2200) BP: (128-165)/(70-98) 148/89 mmHg (08/08 2200) SpO2:  [90 %-100 %] 97 % (08/08 2200) Weight:  [59.6 kg (131 lb 6.3 oz)] 59.6 kg (131 lb 6.3 oz) (08/08 2100) HEMODYNAMICS:   VENTILATOR SETTINGS:   INTAKE / OUTPUT:  Intake/Output Summary (Last 24 hours) at 06/29/14 2241 Last data filed at 06/29/14 2200  Gross per 24 hour  Intake 367.88 ml  Output      0 ml  Net 367.88 ml    PHYSICAL EXAMINATION: General:  sleepy but arousable, able to follow commands,  oriented Neuro:  Both legs are 2/5 strength, both arms are 5/5. Has mild numbness on both legs from mid-thigh down, no facial droop, otngue mildline, pupils equal, EOM full and equal HEENT:  Atraumatic, EOM full and equal, no stridor, no enlarged neck LN Cardiovascular:  RRR, no loud murmur Lungs:  Decreased breath sounds bilaterally but no wheeze, no rales Abdomen:  Soft, nontender, no guarding Musculoskeletal:  No gross deformities, no edema Skin:  No rash, no sacral wounds  LABS:  CBC  Recent Labs Lab 06/29/14 1620 06/29/14 1653  WBC 7.8  --   HGB 14.5 16.3*  HCT 43.5 48.0*  PLT 207  --    Coag's No results found for this basename: APTT, INR,  in the last 168 hours BMET  Recent Labs Lab 06/29/14 1620 06/29/14 1653  NA 143 146  K 2.8* 2.7*  CL 106 107  CO2 21  --   BUN 13 13  CREATININE 1.31* 1.50*  GLUCOSE 149* 154*   Electrolytes  Recent Labs Lab 06/29/14 1620  CALCIUM 9.2   Sepsis Markers No results found for this basename: LATICACIDVEN, PROCALCITON, O2SATVEN,  in the last 168 hours ABG No results found for this basename: PHART, PCO2ART, PO2ART,  in the last 168 hours Liver Enzymes No results found for this basename: AST, ALT, ALKPHOS, BILITOT, ALBUMIN,  in the last 168 hours Cardiac Enzymes No results found for this basename: TROPONINI, PROBNP,  in the last 168 hours Glucose No results found for this basename: GLUCAP,  in the last 168 hours  Imaging No results found.   ASSESSMENT / PLAN:  PULMONARY A: Smoker, unknown PFT P:   Albuterol PRN May use nicotine patch if needed Advised to stop smoking  CARDIOVASCULAR A: Type B dissection, HTN, 1 report of AFib prior to admission P:  Medical management for now, complicated by the fact that she has possible spinal infarct For now goal SBP < 120, HR < 60, but keep MAP around 75-80 due to possible spinal infarct Will use labetalol gtt and nicardipine gtt No anticoagulant for now until patient  more stable Check lipid profile  RENAL A:  Known CKD possibly stage 3, hypokalemia P:   Replace potassium, recheck CMP When patient more stable, start renal diet and BP control Avoid nephrotoxic meds if possible UA  GASTROINTESTINAL A:  No acute issue P:   PPI prophylaxis  HEMATOLOGIC A:  No acute issue P:  Check CBC, PT, INR  INFECTIOUS A:  No acute issue P:   BCx2  UC  Sputum Abx:   ENDOCRINE A:  Possible DM P:   Check fingerstick, HbA1c, ISS  NEUROLOGIC A:  Possible spinal infarct, question if related to AFib (embolic) vs aortic dissection P:   BP management as above. No anticoags for now until more stable. S/p Spinal drain insertion Physical therapy   TODAY'S SUMMARY: admitted for type B dissection and spinal infarct. BP control  with drips. Spinal drain inserted  I have personally obtained a history, examined the patient, evaluated laboratory and imaging results, formulated the assessment and plan and placed orders. CRITICAL CARE: The patient is critically ill with multiple organ systems failure and requires high complexity decision making for assessment and support, frequent evaluation and titration of therapies, application of advanced monitoring technologies and extensive interpretation of multiple databases. Critical Care Time devoted to patient care services described in this note is 45 minutes.    Pulmonary and Critical Care Medicine [facility] Pager: [phone]  06/29/2014, 10:41 PM

## 2014-06-30 DIAGNOSIS — I71019 Dissection of thoracic aorta, unspecified: Secondary | ICD-10-CM

## 2014-06-30 DIAGNOSIS — R110 Nausea: Secondary | ICD-10-CM

## 2014-06-30 DIAGNOSIS — F17200 Nicotine dependence, unspecified, uncomplicated: Secondary | ICD-10-CM

## 2014-06-30 DIAGNOSIS — G9519 Other vascular myelopathies: Principal | ICD-10-CM

## 2014-06-30 DIAGNOSIS — I7101 Dissection of thoracic aorta: Secondary | ICD-10-CM

## 2014-06-30 DIAGNOSIS — I369 Nonrheumatic tricuspid valve disorder, unspecified: Secondary | ICD-10-CM

## 2014-06-30 LAB — URINALYSIS, ROUTINE W REFLEX MICROSCOPIC
Bilirubin Urine: NEGATIVE
GLUCOSE, UA: NEGATIVE mg/dL
KETONES UR: 15 mg/dL — AB
NITRITE: NEGATIVE
Protein, ur: NEGATIVE mg/dL
Specific Gravity, Urine: >1.046 — ABNORMAL HIGH (ref 1.005–1.030)
UROBILINOGEN UA: 0.2 mg/dL (ref 0.0–1.0)
pH: 5 (ref 5.0–8.0)

## 2014-06-30 LAB — CBC WITH DIFFERENTIAL/PLATELET
Basophils Absolute: 0 10*3/uL (ref 0.0–0.1)
Basophils Relative: 0 % (ref 0–1)
EOS PCT: 0 % (ref 0–5)
Eosinophils Absolute: 0 10*3/uL (ref 0.0–0.7)
HEMATOCRIT: 42.7 % (ref 36.0–46.0)
Hemoglobin: 14.1 g/dL (ref 12.0–15.0)
LYMPHS ABS: 0.9 10*3/uL (ref 0.7–4.0)
LYMPHS PCT: 8 % — AB (ref 12–46)
MCH: 28.3 pg (ref 26.0–34.0)
MCHC: 33 g/dL (ref 30.0–36.0)
MCV: 85.7 fL (ref 78.0–100.0)
Monocytes Absolute: 0.4 10*3/uL (ref 0.1–1.0)
Monocytes Relative: 4 % (ref 3–12)
NEUTROS ABS: 9.8 10*3/uL — AB (ref 1.7–7.7)
Neutrophils Relative %: 88 % — ABNORMAL HIGH (ref 43–77)
PLATELETS: 198 10*3/uL (ref 150–400)
RBC: 4.98 MIL/uL (ref 3.87–5.11)
RDW: 15.2 % (ref 11.5–15.5)
WBC: 11.1 10*3/uL — AB (ref 4.0–10.5)

## 2014-06-30 LAB — BASIC METABOLIC PANEL
Anion gap: 20 — ABNORMAL HIGH (ref 5–15)
BUN: 18 mg/dL (ref 6–23)
CO2: 21 mEq/L (ref 19–32)
Calcium: 9.5 mg/dL (ref 8.4–10.5)
Chloride: 104 mEq/L (ref 96–112)
Creatinine, Ser: 1.67 mg/dL — ABNORMAL HIGH (ref 0.50–1.10)
GFR calc Af Amer: 40 mL/min — ABNORMAL LOW (ref >90)
GFR calc non Af Amer: 35 mL/min — ABNORMAL LOW (ref >90)
Glucose, Bld: 156 mg/dL — ABNORMAL HIGH (ref 70–99)
Potassium: 3.7 mEq/L (ref 3.7–5.3)
Sodium: 145 mEq/L (ref 137–147)

## 2014-06-30 LAB — URINE MICROSCOPIC-ADD ON

## 2014-06-30 LAB — PHOSPHORUS: Phosphorus: 3.7 mg/dL (ref 2.3–4.6)

## 2014-06-30 LAB — HEMOGLOBIN A1C
HEMOGLOBIN A1C: 5.7 % — AB (ref <5.7)
MEAN PLASMA GLUCOSE: 117 mg/dL — AB (ref <117)

## 2014-06-30 LAB — HEPATIC FUNCTION PANEL
ALT: 16 U/L (ref 0–35)
AST: 18 U/L (ref 0–37)
Albumin: 3.7 g/dL (ref 3.5–5.2)
Alkaline Phosphatase: 93 U/L (ref 39–117)
Bilirubin, Direct: <0.2 mg/dL (ref 0.0–0.3)
Total Bilirubin: 0.2 mg/dL — ABNORMAL LOW (ref 0.3–1.2)
Total Protein: 7.7 g/dL (ref 6.0–8.3)

## 2014-06-30 LAB — GLUCOSE, CAPILLARY
GLUCOSE-CAPILLARY: 160 mg/dL — AB (ref 70–99)
GLUCOSE-CAPILLARY: 111 mg/dL — AB (ref 70–99)
GLUCOSE-CAPILLARY: 98 mg/dL (ref 70–99)
Glucose-Capillary: 96 mg/dL (ref 70–99)
Glucose-Capillary: 108 mg/dL — ABNORMAL HIGH (ref 70–99)

## 2014-06-30 LAB — PROTIME-INR
INR: 1.1 (ref 0.00–1.49)
Prothrombin Time: 14.2 seconds (ref 11.6–15.2)

## 2014-06-30 LAB — LIPID PANEL
Cholesterol: 201 mg/dL — ABNORMAL HIGH (ref 0–200)
HDL: 53 mg/dL (ref >39)
LDL CALC: 129 mg/dL — AB (ref 0–99)
Total CHOL/HDL Ratio: 3.8 RATIO
Triglycerides: 96 mg/dL (ref <150)
VLDL: 19 mg/dL (ref 0–40)

## 2014-06-30 LAB — LACTIC ACID, PLASMA: Lactic Acid, Venous: 4.7 mmol/L — ABNORMAL HIGH (ref 0.5–2.2)

## 2014-06-30 LAB — MAGNESIUM: MAGNESIUM: 1.8 mg/dL (ref 1.5–2.5)

## 2014-06-30 LAB — APTT: aPTT: 34 seconds (ref 24–37)

## 2014-06-30 MED ORDER — INSULIN ASPART 100 UNIT/ML ~~LOC~~ SOLN
0.0000 [IU] | SUBCUTANEOUS | Status: DC
  Administered 2014-07-01 – 2014-07-03 (×5): 2 [IU] via SUBCUTANEOUS

## 2014-06-30 MED ORDER — HYDROMORPHONE HCL PF 1 MG/ML IJ SOLN
0.5000 mg | INTRAMUSCULAR | Status: DC | PRN
  Administered 2014-06-30 – 2014-07-01 (×6): 1 mg via INTRAVENOUS
  Administered 2014-07-01: 0.5 mg via INTRAVENOUS
  Administered 2014-07-01: 1 mg via INTRAVENOUS
  Administered 2014-07-01: 0.5 mg via INTRAVENOUS
  Administered 2014-07-02 – 2014-07-03 (×4): 1 mg via INTRAVENOUS
  Filled 2014-06-30 (×12): qty 1

## 2014-06-30 MED ORDER — PNEUMOCOCCAL VAC POLYVALENT 25 MCG/0.5ML IJ INJ
0.5000 mL | INJECTION | INTRAMUSCULAR | Status: DC
  Filled 2014-06-30: qty 0.5

## 2014-06-30 NOTE — Progress Notes (Signed)
Echocardiogram 2D Echocardiogram has been performed.  Leta JunglingCooper, Briona Korpela M 06/30/2014, 1:02 PM

## 2014-06-30 NOTE — Progress Notes (Signed)
[facility] FOR [facility] LAB REPLACEMENT ONLY  The patient does not apply for the [facility] Protocol based on the criteria listed below:   1. Is GFR >/= 40 ml/min? Yes.    Patient's GFR today is 40 2. Is urine output >/= 0.5 ml/kg/hr for the last 6 hours? No. Patient's UOP is 0 ml/kg/hr 3. Is BUN < 60 mg/dL? Yes.    Patient's BUN today is 18 4. Abnormal electrolyte(s): K+ 3.7 5. Ordered repletion with: N/A 6. If a panic level lab has been reported, has the CCM MD in charge been notified? Yes.  .   Physician:  Dr. Nathanial MillmanZubelevitskiy  RAMZAH, Alda BertholdYOUNKAI E 06/30/2014 6:12 AM

## 2014-06-30 NOTE — Progress Notes (Addendum)
      [address]       [place] [id number]             [phone]             Subjective: Patient states has pain in mid sternum when takes deep breath.  Objective: Vital signs in last 24 hours: Temp:  [97.5 F (36.4 C)-98.3 F (36.8 C)] 97.5 F (36.4 C) (08/08 2200) Pulse Rate:  [50-101] 76 (08/09 0800) Cardiac Rhythm:  [-] Normal sinus rhythm (08/08 2200) Resp:  [11-34] 19 (08/09 0800) BP: (100-165)/(48-98) 116/64 mmHg (08/09 0800) SpO2:  [90 %-100 %] 93 % (08/09 0800) Weight:  [131 lb 6.3 oz (59.6 kg)-146 lb 13.2 oz (66.6 kg)] 146 lb 13.2 oz (66.6 kg) (08/09 0336)   Current Weight  06/30/14 146 lb 13.2 oz (66.6 kg)       Intake/Output from previous day: 08/08 0701 - 08/09 0700 In: 394.9 [I.V.:394.9] Out: 1048 [Urine:950; Drains:98]   Physical Exam:  Cardiovascular: RRR Pulmonary: Clear to auscultation bilaterally; no rales, wheezes, or rhonchi. Abdomen: Soft, non tender, bowel sounds present. Extremities: SCDs in place. Feet warm and pulses intact. Wounds: Clean and dry.  No erythema or signs of infection. Neurologic: Improving sensation lower extremities as is able to wiglle toes  Lab Results: CBC: Recent Labs  06/29/14 1620 06/29/14 1653 06/30/14 0255  WBC 7.8  --  11.1*  HGB 14.5 16.3* 14.1  HCT 43.5 48.0* 42.7  PLT 207  --  198   BMET:  Recent Labs  06/29/14 1620 06/29/14 1653 06/30/14 0303  NA 143 146 145  K 2.8* 2.7* 3.7  CL 106 107 104  CO2 21  --  21  GLUCOSE 149* 154* 156*  BUN 13 13 18   CREATININE 1.31* 1.50* 1.67*  CALCIUM 9.2  --  9.5    PT/INR:  Lab Results  Component Value Date   INR 1.10 06/30/2014   INR 1.0 12/02/2007   ABG:  INR: Will add last result for INR, ABG once components are confirmed Will add last 4 CBG results once components are confirmed  Assessment/Plan:  1. 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 Spinal drain placed yesterday. On Labetalol and  Nicardipine drips to control BP.  2. 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 Medical management per Dr. Rexene AlbertsFeinstein   ZIMMERMAN,DONIELLE MPA-C 06/30/2014,8:59 AM  Patient 's chest pain much improved, some discomfort with cough but otherwise comfortable.  noticeable improvement in lower extremity movement, had no motor function in legs last pm, now moves feet and some resistance to gravity with upper legs , sensation intact Continue drain for today  I have seen and examined Rowe RobertHelene J Gaunce and agree with the above assessment  and plan.  Delight OvensEdward B Anna Livers MD Beeper [phone] Office [phone] 06/30/2014 10:28 AM

## 2014-06-30 NOTE — Progress Notes (Addendum)
Subjective: Some improvement in LE weakness  Exam: Filed Vitals:   06/30/14 0914  BP:   Pulse: 58  Temp:   Resp: 15   Gen: In bed, NAD MS: Awake, alert XB:JYNWGCN:PERRL, EOMI Motor: able to move feelt with 2/5 strength. Unable to lift thighs, but does have some voluntary contraction.  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 She does appear to have had some improvement in LE weakness compared to exam documented by Dr. Roseanne RenoStewart yesterday, however continues to be markedly impaired.   Recommendations: 1) Lumbar drain per surgery, she seems to be getting some benefit currently.  2) repair of aortic dissection per CT surgery.  3) will continue to follow.   Ritta SlotMcNeill Kaire Stary, MD [facility] [phone]  If 7pm- 7am, please page neurology on call as listed in AMION.

## 2014-06-30 NOTE — Progress Notes (Signed)
Attempted to place A-line x 1 with an unsuccessful attempt due to inability to thread catheter. Pt. Refuses to be stuck again. CCM was made aware by RN & they are okay with going by cuff pressures.

## 2014-06-30 NOTE — Progress Notes (Signed)
  48 yo F s/p aortic dissection with spinal cord injury managed with spinal drain.   Physical Exam: BP 128/66  Pulse 59  Temp(Src) 36.7 C (Oral)  Resp 21  Ht 5\' 5"  (1.651 m)  Wt 146 lb 13.2 oz (66.6 kg)  BMI 24.43 kg/m2  SpO2 97% Awake, alert and oriented x3 Drain patent with free flow of CSF. Dressing clean dry and intact. Patient able to wiggle toes on feet bilaterally but unable to lift legs.  A/P: Will continue to follow drain function and site for signs of infection. Management per CT surgery.

## 2014-06-30 NOTE — Progress Notes (Signed)
PULMONARY / CRITICAL CARE MEDICINE   Name: Charlotte RobertHelene J Galasso MRN: [id number] DOB: 05/18/1966    ADMISSION DATE:  06/29/2014  REFERRING MD : EDP  CHIEF COMPLAINT:  Inability to walk, chest pain  INITIAL PRESENTATION:  48 yo female smoker with weakness, dyspnea and chest pain.  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  STUDIES:  8/08 CT chest >> type B aortic dissection from Lt [place] artery 8/08 CT abd/pelvis >> dissection extends to aortic bifurcation 8/09 Echo >>   SIGNIFICANT EVENTS: 8/08 Admit, TCTS/neurology consulted, spinal drain placed by anesthesia  SUBJECTIVE:  Still has chest discomfort, but better.  C/o intermittent nausea.  VITAL SIGNS: Temp:  [97.5 F (36.4 C)-98.3 F (36.8 C)] 97.5 F (36.4 C) (08/08 2200) Pulse Rate:  [50-101] 64 (08/09 0730) Resp:  [11-34] 14 (08/09 0730) BP: (100-165)/(48-98) 111/59 mmHg (08/09 0730) SpO2:  [90 %-100 %] 92 % (08/09 0730) Weight:  [131 lb 6.3 oz (59.6 kg)-146 lb 13.2 oz (66.6 kg)] 146 lb 13.2 oz (66.6 kg) (08/09 0336) INTAKE / OUTPUT:  Intake/Output Summary (Last 24 hours) at 06/30/14 0744 Last data filed at 06/30/14 0700  Gross per 24 hour  Intake 394.89 ml  Output   1048 ml  Net -653.11 ml    PHYSICAL EXAMINATION: General: laying flat in bed Neuro: improved sensation in lower extremities HEENT: no sinus tenderness Cardiovascular: regular, no murmur Lungs: no wheeze Abdomen: soft, non tender Musculoskeletal: no edema Skin: no rashes  LABS:  CBC  Recent Labs Lab 06/29/14 1620 06/29/14 1653 06/30/14 0255  WBC 7.8  --  11.1*  HGB 14.5 16.3* 14.1  HCT 43.5 48.0* 42.7  PLT 207  --  198   Coag's  Recent Labs Lab 06/30/14 0255  APTT 34  INR 1.10   BMET  Recent Labs Lab 06/29/14 1620 06/29/14 1653 06/30/14 0303  NA 143 146 145  K 2.8* 2.7* 3.7  CL 106 107 104  CO2 21  --  21  BUN 13 13 18   CREATININE 1.31* 1.50* 1.67*  GLUCOSE  149* 154* 156*   Electrolytes  Recent Labs Lab 06/29/14 1620 06/30/14 0303  CALCIUM 9.2 9.5  MG  --  1.8  PHOS  --  3.7   Sepsis Markers  Recent Labs Lab 06/30/14 0255  LATICACIDVEN 4.7*   Liver Enzymes  Recent Labs Lab 06/30/14 0303  AST 18  ALT 16  ALKPHOS 93  BILITOT 0.2*  ALBUMIN 3.7   Glucose  Recent Labs Lab 06/30/14 0334  GLUCAP 160*    Imaging Ct Angio Chest W/cm &/or Wo Cm  06/29/2014   ADDENDUM REPORT: 06/29/2014 20:00  ADDENDUM: Case was reviewed with Dr. Tyrone SageGerhardt at the time of addendum.  A 4 mm penetrating atherosclerotic ulcer is possible along the posterior aspect of the descending thoracic aorta (series 701/image 41). Although very subtle, a slight hyperdense crescent may be present on unenhanced imaging, reflecting intramural hematoma.  As such, while the findings remain compatible with acute aortic syndrome, this may reflect sequela of penetrating atherosclerotic ulcer rather than true aortic dissection.   Electronically Signed   By: Charline BillsSriyesh  Krishnan M.D.   On: 06/29/2014 20:00   06/29/2014   CLINICAL DATA:  Chest/back pain, weakness, nausea. History of renal disorder.  EXAM: CT ANGIOGRAPHY CHEST AND ABDOMEN  TECHNIQUE: Multidetector CT imaging of the chest and abdomen was performed using the standard protocol during bolus administration of intravenous  contrast. Multiplanar CT image reconstructions and MIPs were obtained to evaluate the vascular anatomy.  CONTRAST:  OMNIPAQUE IOHEXOL 350 MG/ML SOLN  COMPARISON:  Chest radiograph dated 06/29/2014. Unenhanced CT abdomen pelvis dated 04/20/2009.  FINDINGS: CTA CHEST FINDINGS  No evidence of intramural hematoma on unenhanced CT.  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  Although not tailored for evaluation of the pulmonary arteries, there is no evidence of pulmonary embolism.  Mild dependent atelectasis at the lung bases. No suspicious pulmonary nodules. No  pleural effusion or pneumothorax.  Visualized thyroid is unremarkable.  Heart is normal in size.  No pericardial effusion.  No suspicious mediastinal, hilar, or axillary lymphadenopathy.  Visualized osseous structures are within normal limits.  Review of the MIP images confirms the above findings.  CTA ABDOMEN FINDINGS  Aortic dissection extends to the aortic bifurcation.  Celiac artery, SMA, IMA, and bilateral renal arteries all arise from the true lumen and remain patent.  Scattered hepatic cysts measuring up to 2.1 cm. Liver is otherwise within normal limits.  Spleen, pancreas, and adrenal glands are within normal limits.  Gallbladder is unremarkable. No intrahepatic or extrahepatic ductal dilatation.  Numerous bilateral renal cysts, compatible with polycystic renal disease. No hydronephrosis.  Visualized bowel is unremarkable.  No abdominal ascites.  No suspicious abdominal lymphadenopathy.  Visualized osseous structures are within normal limits.  Review of the MIP images confirms the above findings.  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  Celiac artery, SMA, IMA, and bilateral renal arteries all arise from the true lumen and remain patent.  No evidence of intramural hematoma.  Additional ancillary findings as above.  These results were called by telephone at the time of interpretation on 06/29/2014 at 6:20 pm to Dr. Azalia Bilis , who verbally acknowledged these results.  Electronically Signed: By: Charline Bills M.D. On: 06/29/2014 18:34   Ct Angio Abdomen W/cm &/or Wo Contrast  06/29/2014   ADDENDUM REPORT: 06/29/2014 20:00  ADDENDUM: Case was reviewed with Dr. Tyrone Sage at the time of addendum.  A 4 mm penetrating atherosclerotic ulcer is possible along the posterior aspect of the descending thoracic aorta (series 701/image 41). Although very subtle, a slight hyperdense crescent may be present on  unenhanced imaging, reflecting intramural hematoma.  As such, while the findings remain compatible with acute aortic syndrome, this may reflect sequela of penetrating atherosclerotic ulcer rather than true aortic dissection.   Electronically Signed   By: Charline Bills M.D.   On: 06/29/2014 20:00   06/29/2014   CLINICAL DATA:  Chest/back pain, weakness, nausea. History of renal disorder.  EXAM: CT ANGIOGRAPHY CHEST AND ABDOMEN  TECHNIQUE: Multidetector CT imaging of the chest and abdomen was performed using the standard protocol during bolus administration of intravenous contrast. Multiplanar CT image reconstructions and MIPs were obtained to evaluate the vascular anatomy.  CONTRAST:  OMNIPAQUE IOHEXOL 350 MG/ML SOLN  COMPARISON:  Chest radiograph dated 06/29/2014. Unenhanced CT abdomen pelvis dated 04/20/2009.  FINDINGS: CTA CHEST FINDINGS  No evidence of intramural hematoma on unenhanced CT.  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  Although not tailored for evaluation of the pulmonary arteries, there is no evidence of pulmonary embolism.  Mild dependent atelectasis at the lung bases. No suspicious pulmonary nodules. No pleural effusion or pneumothorax.  Visualized thyroid is unremarkable.  Heart is normal in size.  No pericardial effusion.  No suspicious mediastinal, hilar, or axillary lymphadenopathy.  Visualized osseous structures are within normal limits.  Review of the MIP images confirms the above findings.  CTA ABDOMEN FINDINGS  Aortic dissection extends to the aortic bifurcation.  Celiac artery, SMA, IMA, and bilateral renal arteries all arise from the true lumen and remain patent.  Scattered hepatic cysts measuring up to 2.1 cm. Liver is otherwise within normal limits.  Spleen, pancreas, and adrenal glands are within normal limits.  Gallbladder is unremarkable. No intrahepatic or extrahepatic ductal dilatation.  Numerous bilateral renal cysts,  compatible with polycystic renal disease. No hydronephrosis.  Visualized bowel is unremarkable.  No abdominal ascites.  No suspicious abdominal lymphadenopathy.  Visualized osseous structures are within normal limits.  Review of the MIP images confirms the above findings.  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  Celiac artery, SMA, IMA, and bilateral renal arteries all arise from the true lumen and remain patent.  No evidence of intramural hematoma.  Additional ancillary findings as above.  These results were called by telephone at the time of interpretation on 06/29/2014 at 6:20 pm to Dr. Azalia Bilis , who verbally acknowledged these results.  Electronically Signed: By: Charline Bills M.D. On: 06/29/2014 18:34   Dg Chest Port 1 View  06/29/2014   CLINICAL DATA:  Chest pain  EXAM: PORTABLE CHEST - 1 VIEW  COMPARISON:  04/13/2010  FINDINGS: Lungs are clear.  No pleural effusion or pneumothorax.  The heart is normal in size.  IMPRESSION: No evidence of acute cardiopulmonary disease.   Electronically Signed   By: Charline Bills M.D.   On: 06/29/2014 17:16     ASSESSMENT / PLAN:  PULMONARY A:  Hx of tobacco use. P:   Smoking cessation  CARDIOVASCULAR A:  Type B aortic dissection. Hx of HTN. Transient A fib prior to admission. P:  Goal SBP < 120, HR < 70 Continue labetalol/nicardipine gtt  RENAL A:   AKI >> no recent labs for baseline renal fx. Hypokalemia. P:   Monitor renal fx, urine outpt, electrolytes  GASTROINTESTINAL A:   Nutrition. P:   NPO until she is able to sit up  HEMATOLOGIC A:   No acute issues. P:  F/u CBC SCD for DVT prevention  INFECTIOUS A:  No evidence for infection. P:   Monitor clinically  ENDOCRINE A:  Hyperglycemia. 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 P:    Spinal drain per TCTS  D/w Dr. Amada Jupiter.  CC time 35 minutes  Coralyn Helling, MD [facility] Pulmonary/Critical Care 06/30/2014, 8:00 AM Pager:  [phone] After 3pm call: [phone]

## 2014-07-01 DIAGNOSIS — R29898 Other symptoms and signs involving the musculoskeletal system: Secondary | ICD-10-CM

## 2014-07-01 DIAGNOSIS — I7100 Dissection of unspecified site of aorta: Secondary | ICD-10-CM

## 2014-07-01 LAB — GLUCOSE, CAPILLARY
Glucose-Capillary: 100 mg/dL — ABNORMAL HIGH (ref 70–99)
Glucose-Capillary: 102 mg/dL — ABNORMAL HIGH (ref 70–99)
Glucose-Capillary: 108 mg/dL — ABNORMAL HIGH (ref 70–99)
Glucose-Capillary: 114 mg/dL — ABNORMAL HIGH (ref 70–99)
Glucose-Capillary: 117 mg/dL — ABNORMAL HIGH (ref 70–99)
Glucose-Capillary: 134 mg/dL — ABNORMAL HIGH (ref 70–99)
Glucose-Capillary: 99 mg/dL (ref 70–99)

## 2014-07-01 LAB — CBC
HEMATOCRIT: 38.7 % (ref 36.0–46.0)
Hemoglobin: 12.3 g/dL (ref 12.0–15.0)
MCH: 27.3 pg (ref 26.0–34.0)
MCHC: 31.8 g/dL (ref 30.0–36.0)
MCV: 85.8 fL (ref 78.0–100.0)
Platelets: 180 10*3/uL (ref 150–400)
RBC: 4.51 MIL/uL (ref 3.87–5.11)
RDW: 15.4 % (ref 11.5–15.5)
WBC: 8.7 10*3/uL (ref 4.0–10.5)

## 2014-07-01 LAB — BASIC METABOLIC PANEL
Anion gap: 15 (ref 5–15)
BUN: 23 mg/dL (ref 6–23)
CALCIUM: 8.9 mg/dL (ref 8.4–10.5)
CO2: 21 meq/L (ref 19–32)
CREATININE: 1.6 mg/dL — AB (ref 0.50–1.10)
Chloride: 108 mEq/L (ref 96–112)
GFR calc Af Amer: 42 mL/min — ABNORMAL LOW (ref >90)
GFR calc non Af Amer: 37 mL/min — ABNORMAL LOW (ref >90)
GLUCOSE: 102 mg/dL — AB (ref 70–99)
Potassium: 3.5 mEq/L — ABNORMAL LOW (ref 3.7–5.3)
Sodium: 144 mEq/L (ref 137–147)

## 2014-07-01 MED ORDER — POTASSIUM CHLORIDE CRYS ER 20 MEQ PO TBCR
40.0000 meq | EXTENDED_RELEASE_TABLET | Freq: Two times a day (BID) | ORAL | Status: DC
  Administered 2014-07-01 – 2014-07-07 (×13): 40 meq via ORAL
  Filled 2014-07-01 (×17): qty 2

## 2014-07-01 NOTE — Progress Notes (Addendum)
TCTS DAILY ICU PROGRESS NOTE                   [address]            [place] [id number]          [phone]        Total Length of Stay:  LOS: 2 days   Subjective: Some intermittent chest pain, relieved with the IV dilaudid  Objective: Vital signs in last 24 hours: Temp:  [97.7 F (36.5 C)-98.1 F (36.7 C)] 97.7 F (36.5 C) (08/10 1200) Pulse Rate:  [51-74] 74 (08/10 0600) Cardiac Rhythm:  [-] Normal sinus rhythm (08/09 2000) Resp:  [11-27] 27 (08/10 0600) BP: (108-128)/(50-68) 119/61 mmHg (08/10 0600) SpO2:  [89 %-99 %] 94 % (08/10 0600) Weight:  [150 lb 9.2 oz (68.3 kg)] 150 lb 9.2 oz (68.3 kg) (08/10 0400)  Filed Weights   06/29/14 2100 06/30/14 0336 07/01/14 0400  Weight: 131 lb 6.3 oz (59.6 kg) 146 lb 13.2 oz (66.6 kg) 150 lb 9.2 oz (68.3 kg)    Weight change: 19 lb 2.9 oz (8.7 kg)   Hemodynamic parameters for last 24 hours:    Intake/Output from previous day: 08/09 0701 - 08/10 0700 In: 1107.5 [I.V.:1107.5] Out: 961 [Urine:725; Emesis/NG output:60; Drains:176]  Intake/Output this shift: Total I/O In: 72.5 [I.V.:72.5] Out: 145 [Urine:145]  Current Meds: Scheduled Meds: . docusate sodium  100 mg Oral Daily  . insulin aspart  0-15 Units Subcutaneous 6 times per day  . pneumococcal 23 valent vaccine  0.5 mL Intramuscular Tomorrow-1000  . potassium chloride  40 mEq Oral BID   Continuous Infusions: . labetalol (NORMODYNE) infusion 0.5 mg/min (07/01/14 1116)  . niCARDipine 3 mg/hr (07/01/14 0858)   PRN Meds:.sodium chloride, albuterol, alum & mag hydroxide-simeth, HYDROmorphone (DILAUDID) injection, ondansetron  General appearance: alert, cooperative and no distress Neurologic: bil LE weakness Heart: regular rate and rhythm, S1, S2 normal, no murmur, click, rub or gallop Lungs: clear to auscultation bilaterally and no crackles/wheeze Abdomen: soft, non-tender Extremities: no edema  Lab Results: CBC: Recent Labs  06/30/14 0255  07/01/14 0350  WBC 11.1* 8.7  HGB 14.1 12.3  HCT 42.7 38.7  PLT 198 180   BMET:  Recent Labs  06/30/14 0303 07/01/14 0350  NA 145 144  K 3.7 3.5*  CL 104 108  CO2 21 21  GLUCOSE 156* 102*  BUN 18 23  CREATININE 1.67* 1.60*  CALCIUM 9.5 8.9    PT/INR:  Recent Labs  06/30/14 0255  LABPROT 14.2  INR 1.10   Radiology: Ct Angio Chest W/cm &/or Wo Cm  06/29/2014   ADDENDUM REPORT: 06/29/2014 20:00  ADDENDUM: Case was reviewed with Dr. Tyrone SageGerhardt at the time of addendum.  A 4 mm penetrating atherosclerotic ulcer is possible along the posterior aspect of the descending thoracic aorta (series 701/image 41). Although very subtle, a slight hyperdense crescent may be present on unenhanced imaging, reflecting intramural hematoma.  As such, while the findings remain compatible with acute aortic syndrome, this may reflect sequela of penetrating atherosclerotic ulcer rather than true aortic dissection.   Electronically Signed   By: Charline BillsSriyesh  Krishnan M.D.   On: 06/29/2014 20:00   06/29/2014   CLINICAL DATA:  Chest/back pain, weakness, nausea. History of renal disorder.  EXAM: CT ANGIOGRAPHY CHEST AND ABDOMEN  TECHNIQUE: Multidetector CT imaging of the chest and abdomen was performed using the standard protocol during bolus administration of intravenous contrast. Multiplanar CT image reconstructions  and MIPs were obtained to evaluate the vascular anatomy.  CONTRAST:  OMNIPAQUE IOHEXOL 350 MG/ML SOLN  COMPARISON:  Chest radiograph dated 06/29/2014. Unenhanced CT abdomen pelvis dated 04/20/2009.  FINDINGS: CTA CHEST FINDINGS  No evidence of intramural hematoma on unenhanced CT.  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  Although not tailored for evaluation of the pulmonary arteries, there is no evidence of pulmonary embolism.  Mild dependent atelectasis at the lung bases. No suspicious pulmonary nodules. No pleural effusion or pneumothorax.  Visualized  thyroid is unremarkable.  Heart is normal in size.  No pericardial effusion.  No suspicious mediastinal, hilar, or axillary lymphadenopathy.  Visualized osseous structures are within normal limits.  Review of the MIP images confirms the above findings.  CTA ABDOMEN FINDINGS  Aortic dissection extends to the aortic bifurcation.  Celiac artery, SMA, IMA, and bilateral renal arteries all arise from the true lumen and remain patent.  Scattered hepatic cysts measuring up to 2.1 cm. Liver is otherwise within normal limits.  Spleen, pancreas, and adrenal glands are within normal limits.  Gallbladder is unremarkable. No intrahepatic or extrahepatic ductal dilatation.  Numerous bilateral renal cysts, compatible with polycystic renal disease. No hydronephrosis.  Visualized bowel is unremarkable.  No abdominal ascites.  No suspicious abdominal lymphadenopathy.  Visualized osseous structures are within normal limits.  Review of the MIP images confirms the above findings.  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  Celiac artery, SMA, IMA, and bilateral renal arteries all arise from the true lumen and remain patent.  No evidence of intramural hematoma.  Additional ancillary findings as above.  These results were called by telephone at the time of interpretation on 06/29/2014 at 6:20 pm to Dr. Azalia Bilis , who verbally acknowledged these results.  Electronically Signed: By: Charline Bills M.D. On: 06/29/2014 18:34   Ct Angio Abdomen W/cm &/or Wo Contrast  06/29/2014   ADDENDUM REPORT: 06/29/2014 20:00  ADDENDUM: Case was reviewed with Dr. Tyrone Sage at the time of addendum.  A 4 mm penetrating atherosclerotic ulcer is possible along the posterior aspect of the descending thoracic aorta (series 701/image 41). Although very subtle, a slight hyperdense crescent may be present on unenhanced imaging, reflecting intramural hematoma.   As such, while the findings remain compatible with acute aortic syndrome, this may reflect sequela of penetrating atherosclerotic ulcer rather than true aortic dissection.   Electronically Signed   By: Charline Bills M.D.   On: 06/29/2014 20:00   06/29/2014   CLINICAL DATA:  Chest/back pain, weakness, nausea. History of renal disorder.  EXAM: CT ANGIOGRAPHY CHEST AND ABDOMEN  TECHNIQUE: Multidetector CT imaging of the chest and abdomen was performed using the standard protocol during bolus administration of intravenous contrast. Multiplanar CT image reconstructions and MIPs were obtained to evaluate the vascular anatomy.  CONTRAST:  OMNIPAQUE IOHEXOL 350 MG/ML SOLN  COMPARISON:  Chest radiograph dated 06/29/2014. Unenhanced CT abdomen pelvis dated 04/20/2009.  FINDINGS: CTA CHEST FINDINGS  No evidence of intramural hematoma on unenhanced CT.  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  Although not tailored for evaluation of the pulmonary arteries, there is no evidence of pulmonary embolism.  Mild dependent atelectasis at the lung bases. No suspicious pulmonary nodules. No pleural effusion or pneumothorax.  Visualized thyroid is unremarkable.  Heart is normal in  size.  No pericardial effusion.  No suspicious mediastinal, hilar, or axillary lymphadenopathy.  Visualized osseous structures are within normal limits.  Review of the MIP images confirms the above findings.  CTA ABDOMEN FINDINGS  Aortic dissection extends to the aortic bifurcation.  Celiac artery, SMA, IMA, and bilateral renal arteries all arise from the true lumen and remain patent.  Scattered hepatic cysts measuring up to 2.1 cm. Liver is otherwise within normal limits.  Spleen, pancreas, and adrenal glands are within normal limits.  Gallbladder is unremarkable. No intrahepatic or extrahepatic ductal dilatation.  Numerous bilateral renal cysts, compatible with polycystic renal disease. No  hydronephrosis.  Visualized bowel is unremarkable.  No abdominal ascites.  No suspicious abdominal lymphadenopathy.  Visualized osseous structures are within normal limits.  Review of the MIP images confirms the above findings.  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  Celiac artery, SMA, IMA, and bilateral renal arteries all arise from the true lumen and remain patent.  No evidence of intramural hematoma.  Additional ancillary findings as above.  These results were called by telephone at the time of interpretation on 06/29/2014 at 6:20 pm to Dr. Azalia Bilis , who verbally acknowledged these results.  Electronically Signed: By: Charline Bills M.D. On: 06/29/2014 18:34   Dg Chest Port 1 View  06/29/2014   CLINICAL DATA:  Chest pain  EXAM: PORTABLE CHEST - 1 VIEW  COMPARISON:  04/13/2010  FINDINGS: Lungs are clear.  No pleural effusion or pneumothorax.  The heart is normal in size.  IMPRESSION: No evidence of acute cardiopulmonary disease.   Electronically Signed   By: Charline Bills M.D.   On: 06/29/2014 17:16     Assessment/Plan:  1 some neuro improvement- cont spinal drain another day 2 BP control currently good, will need to transition to po meds   GOLD,WAYNE E 07/01/2014 12:22 PM  Continued improvement in leg strength from yesterday, continue drain today likely d/c tomorrow I have seen and examined [name] and agree with the above assessment  and plan.  Delight Ovens MD Beeper [phone] Office [phone] 07/01/2014 1:53 PM

## 2014-07-01 NOTE — Plan of Care (Signed)
Problem: Phase I Progression Outcomes Goal: Pain controlled with appropriate interventions Outcome: Progressing IV dilaudid covers pain for short periods of time.  Will transition to oral meds.

## 2014-07-01 NOTE — Progress Notes (Signed)
Pt has had some improvement in LE CNS function. No back pain. Catheter draining well.  Will follow.  Arta BruceKevin Maudry Zeidan MD

## 2014-07-01 NOTE — Progress Notes (Signed)
PULMONARY / CRITICAL CARE MEDICINE   Name: Rowe RobertHelene J Crilly MRN: [id number] DOB: [birth year]    ADMISSION DATE:  06/29/2014  REFERRING MD : EDP  CHIEF COMPLAINT:  Inability to walk, chest pain  INITIAL PRESENTATION:  48 yo female smoker with weakness, dyspnea and chest pain.  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  STUDIES:  8/08 CT chest >> type B aortic dissection from Lt [place] artery 8/08 CT abd/pelvis >> dissection extends to aortic bifurcation 8/09 Echo >> EF 60-65, mild to mod LVH, mild diastolic dysfcn  SIGNIFICANT EVENTS: 8/08 Admit, TCTS/neurology consulted, spinal drain placed by anesthesia  SUBJECTIVE:  Doing well this morning, slightly somnolent, but appropriate, improved lower extremity sensation   VITAL SIGNS: Temp:  [97.9 F (36.6 C)-98.1 F (36.7 C)] 98 F (36.7 C) (08/10 0751) Pulse Rate:  [51-74] 74 (08/10 0600) Resp:  [11-27] 27 (08/10 0600) BP: (105-128)/(50-68) 119/61 mmHg (08/10 0600) SpO2:  [89 %-99 %] 94 % (08/10 0600) Weight:  [150 lb 9.2 oz (68.3 kg)] 150 lb 9.2 oz (68.3 kg) (08/10 0400) INTAKE / OUTPUT:  Intake/Output Summary (Last 24 hours) at 07/01/14 1015 Last data filed at 07/01/14 0930  Gross per 24 hour  Intake   1012 ml  Output   1031 ml  Net    -19 ml    PHYSICAL EXAMINATION: General: laying flat in bed Neuro: improved sensation in lower extremities, 3/5 strength HEENT: no sinus tenderness Cardiovascular: regular, no murmur Lungs: no wheeze Abdomen: soft, non tender Musculoskeletal: no edema Skin: no rashes  LABS:  CBC  Recent Labs Lab 06/29/14 1620 06/29/14 1653 06/30/14 0255 07/01/14 0350  WBC 7.8  --  11.1* 8.7  HGB 14.5 16.3* 14.1 12.3  HCT 43.5 48.0* 42.7 38.7  PLT 207  --  198 180   Coag's  Recent Labs Lab 06/30/14 0255  APTT 34  INR 1.10   BMET  Recent Labs Lab 06/29/14 1620 06/29/14 1653 06/30/14 0303 07/01/14 0350  NA 143 146  145 144  K 2.8* 2.7* 3.7 3.5*  CL 106 107 104 108  CO2 21  --  21 21  BUN 13 13 18 23   CREATININE 1.31* 1.50* 1.67* 1.60*  GLUCOSE 149* 154* 156* 102*   Electrolytes  Recent Labs Lab 06/29/14 1620 06/30/14 0303 07/01/14 0350  CALCIUM 9.2 9.5 8.9  MG  --  1.8  --   PHOS  --  3.7  --    Sepsis Markers  Recent Labs Lab 06/30/14 0255  LATICACIDVEN 4.7*   Liver Enzymes  Recent Labs Lab 06/30/14 0303  AST 18  ALT 16  ALKPHOS 93  BILITOT 0.2*  ALBUMIN 3.7   Glucose  Recent Labs Lab 06/30/14 1147 06/30/14 1543 06/30/14 2316 07/01/14 0107 07/01/14 0331 07/01/14 0749  GLUCAP 98 96 108* 117* 108* 100*    Imaging No results found.  ECHO TTE 8/9 Study Conclusions  - Left ventricle: The cavity size was normal. There was moderate concentric hypertrophy. Systolic function was normal. The estimated ejection fraction was in the range of 60% to 65%. Wall motion was normal; there were no regional wall motion abnormalities. Doppler parameters are consistent with abnormal left ventricular relaxation (grade 1 diastolic dysfunction). The E/e&' ratio is <8, suggesting normal LV filling pressure. - Aorta: No clear dissection flap visualized in this study. The aortic arch size is generous, the descending aorta is well visualized and no evidence  for dissection is noted. - Left atrium: The atrium was normal in size. - Tricuspid valve: There was mild regurgitation. - Pulmonary arteries: PA peak pressure: 26 mm Hg (S). - Pericardium, extracardiac: There was no pericardial effusion.  Impressions:  - LVEF 60-65%, moderate concentric LVH, normal LA size, generous aortic arch without clear visualization of a dissection flap, normal descending aorta, diastolic dysfunction, normal LV fililng pressure, no pericardial effusion.   ASSESSMENT / PLAN:  PULMONARY A:  Hx of tobacco use. P:   Smoking cessation  CARDIOVASCULAR A:  Type B aortic dissection. Hx of  HTN. Transient A fib prior to admission. P:  Goal SBP < 120, HR < 70 Continue labetalol/nicardipine gtt  RENAL A:   AKI >> no recent labs for baseline renal fx. Hypokalemia. P:   Monitor renal fx, urine outpt, electrolytes replacement  GASTROINTESTINAL A:   Nutrition. P:   Start soft diet, adv as tolerated  HEMATOLOGIC A:   No acute issues. P:  F/u CBC SCD for DVT prevention  INFECTIOUS A:  No evidence for infection. P:   Monitor clinically  ENDOCRINE A:  Hyperglycemia. 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 P:   Spinal drain per TCTS - appreciate consult    CC time 35 minutes   Stephanie Acre, MD [facility] Pager [phone] On Call Pager [phone]

## 2014-07-01 NOTE — Progress Notes (Signed)
Subjective: Some improvement in LE weakness  Exam: Filed Vitals:   07/01/14 0751  BP:   Pulse:   Temp: 98 F (36.7 C)  Resp:    Gen: In bed, NAD MS: Awake, alert NW:GNFAOCN:PERRL, EOMI Motor: able to move feet with 2/5 strength. Unable to lift thighs, but does have some voluntary contraction. Better hip extension than flexion.  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 She does appear to have had some improvement in LE weakness compared to exam documented by Dr. Roseanne RenoStewart initially, however continues to be markedly impaired.   Recommendations: 1) Management of aortic dissection including BP goals per CT surgery.  2) She is likely going to need a prolonged course of therapy following this event. I doubt she will get full function.  3) will continue to follow.   Charlotte SlotMcNeill Charlotte Kratt, MD [facility] [phone]  If 7pm- 7am, please page neurology on call as listed in AMION.

## 2014-07-02 DIAGNOSIS — G8220 Paraplegia, unspecified: Secondary | ICD-10-CM

## 2014-07-02 HISTORY — PX: OTHER SURGICAL HISTORY: SHX169

## 2014-07-02 LAB — BASIC METABOLIC PANEL
ANION GAP: 13 (ref 5–15)
BUN: 22 mg/dL (ref 6–23)
CHLORIDE: 105 meq/L (ref 96–112)
CO2: 20 mEq/L (ref 19–32)
Calcium: 8.6 mg/dL (ref 8.4–10.5)
Creatinine, Ser: 1.49 mg/dL — ABNORMAL HIGH (ref 0.50–1.10)
GFR calc Af Amer: 46 mL/min — ABNORMAL LOW (ref >90)
GFR calc non Af Amer: 40 mL/min — ABNORMAL LOW (ref >90)
GLUCOSE: 135 mg/dL — AB (ref 70–99)
Potassium: 3.9 mEq/L (ref 3.7–5.3)
SODIUM: 138 meq/L (ref 137–147)

## 2014-07-02 LAB — GLUCOSE, CAPILLARY
GLUCOSE-CAPILLARY: 124 mg/dL — AB (ref 70–99)
GLUCOSE-CAPILLARY: 140 mg/dL — AB (ref 70–99)
GLUCOSE-CAPILLARY: 146 mg/dL — AB (ref 70–99)
Glucose-Capillary: 104 mg/dL — ABNORMAL HIGH (ref 70–99)
Glucose-Capillary: 108 mg/dL — ABNORMAL HIGH (ref 70–99)
Glucose-Capillary: 122 mg/dL — ABNORMAL HIGH (ref 70–99)

## 2014-07-02 LAB — CBC
HCT: 37.7 % (ref 36.0–46.0)
HEMOGLOBIN: 12.2 g/dL (ref 12.0–15.0)
MCH: 27.7 pg (ref 26.0–34.0)
MCHC: 32.4 g/dL (ref 30.0–36.0)
MCV: 85.5 fL (ref 78.0–100.0)
Platelets: 173 10*3/uL (ref 150–400)
RBC: 4.41 MIL/uL (ref 3.87–5.11)
RDW: 15 % (ref 11.5–15.5)
WBC: 8.5 10*3/uL (ref 4.0–10.5)

## 2014-07-02 MED ORDER — ACETAMINOPHEN 325 MG PO TABS: 650.0000 mg | ORAL_TABLET | ORAL | Status: DC | PRN

## 2014-07-02 MED ORDER — ACETAMINOPHEN 325 MG PO TABS
650.0000 mg | ORAL_TABLET | ORAL | Status: DC | PRN
  Administered 2014-07-02 – 2014-07-07 (×5): 650 mg via ORAL
  Filled 2014-07-02 (×5): qty 2

## 2014-07-02 NOTE — Progress Notes (Signed)
Rehab Admissions Coordinator Note:  Patient was screened by Trish MageLogue, Deanta Mincey M for appropriateness for an Inpatient Acute Rehab Consult.  At this time, an inpatient rehab consult has been ordered and is pending completion.  We will follow up once consult is completed.     Trish MageLogue, Camary Sosa M 07/02/2014, 4:18 PM  I can be reached at [phone].

## 2014-07-02 NOTE — Consult Note (Signed)
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 Tyrone Sage.    HPI: Charlotte [name] is a 48 y.o. female with history of HTN, CKD, polysubstance abuse in the past, bipolar disorder;  who was admitted on 06/29/14 with  sudden onset of severe chest pain with SOB and inability to move BLE. Patient noted to have A fib at admission. 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  Dr. Roseanne Reno recommended spinal drain for acute management as well as MRI of thoracic and lumbar spine recommended for work up. Spinal drain placed with improvement in LE strength. On nicardipine and labetalol drips for BP management.   2 D echo with EF 60-65% with grade on diastolic dysfunction.  Drain removed today and therapy evaluations to be done. Chest pain improved and no plans for surgery? Patient will likely require prolonged rehab due to paraparesis and CIR recommended by MD.    Review of Systems  HENT: Negative for hearing loss.   Eyes: Negative for blurred vision and double vision.  Respiratory: Negative for cough and shortness of breath.   Cardiovascular: Negative for chest pain and palpitations.  Gastrointestinal: Positive for constipation. Negative for heartburn, nausea and abdominal pain.  Musculoskeletal: Negative for back pain, myalgias and neck pain.  Neurological: Positive for sensory change, focal weakness, weakness and headaches. Negative for dizziness and tingling.    Past Medical History  Diagnosis Date  . Hypertension   . Renal disorder    Past Surgical History  Procedure Laterality Date  .  Cesarean section     No family history on file.   Social History:  Lives alone in 2 nd floor apartment. Has two daughters in town. She reports that she has been smoking Cigarettes.  She has been smoking about 1-2 PPD. She has never used smokeless tobacco. Has history of alcohol abuse and cocaine use in the past. .   Allergies  Allergen Reactions  . Seroquel [Quetiapine] Other (See Comments)    Night sweats, dizziness and possible confusion  . Olanzapine Nausea And Vomiting    Medications Prior to Admission  Medication Sig Dispense Refill  . amLODipine (NORVASC) 10 MG tablet Take 10 mg by mouth daily.      [name] aspirin-acetaminophen-caffeine (EXCEDRIN MIGRAINE) 250-250-65 MG per tablet Take 1 tablet by mouth every 6 (six) hours as needed for headache.      . lisinopril-hydrochlorothiazide (PRINZIDE,ZESTORETIC) 20-12.5 MG per tablet Take 1 tablet by mouth daily.      . naproxen sodium (ANAPROX) 220 MG tablet Take 220 mg by mouth daily as needed (for pain).      [name] OLANZapine (ZYPREXA) 5 MG tablet Take 5 mg by mouth at bedtime.      [name] QUEtiapine Fumarate (SEROQUEL PO) Take 1 tablet by mouth at bedtime.      . Sertraline HCl (ZOLOFT PO) Take 1 tablet by mouth daily.        Home:    Functional History:   Functional Status:  Mobility:          ADL:    Cognition: Cognition Orientation Level: Oriented X4    Blood pressure 125/64,  pulse 70, temperature 98.3 F (36.8 C), temperature source Oral, resp. rate 23, height 5\' 5"  (1.651 m), weight 67.7 kg (149 lb 4 oz), SpO2 96.00%. Physical Exam  Nursing note and vitals reviewed. Constitutional: [name], place, and time. She appears well-developed and well-nourished.  HENT:  Head: Normocephalic and atraumatic.  Eyes: Conjunctivae are normal. Pupils are equal, round, and reactive to light.  Neck: Normal range of motion. Neck supple.  Cardiovascular: Regular rhythm.   Respiratory: Effort normal and breath sounds  normal. No respiratory distress. She has no wheezes.  GI: Soft. Bowel sounds are normal. She exhibits no distension. There is no tenderness.  Musculoskeletal: She exhibits no edema.  Neurological: She is alert and oriented to person, place, and time.  LLE>RLE weakness. Able to activate quads.  2+/5 PF/DF.  Skin: Skin is warm and dry.  Psychiatric: Her speech is normal. Thought content normal. Her affect is blunt. She is withdrawn.   upper extremities strength 4/5 deltoid, bicep, tricep, grip Patient is lethargic but aroused to voice Lower extremity strength 3 minus right hip flexor knee extensor 2 minus left hip flexor knee extensor  Results for orders placed during the hospital encounter of 06/29/14 (from the past 24 hour(s))  GLUCOSE, CAPILLARY     Status: None   Collection Time    07/01/14 11:52 AM      Result Value Ref Range   Glucose-Capillary 99  70 - 99 mg/dL   Comment 1 Documented in Chart     Comment 2 Notify RN    GLUCOSE, CAPILLARY     Status: Abnormal   Collection Time    07/01/14  3:49 PM      Result Value Ref Range   Glucose-Capillary 114 (*) 70 - 99 mg/dL   Comment 1 Notify RN    GLUCOSE, CAPILLARY     Status: Abnormal   Collection Time    07/01/14  8:08 PM      Result Value Ref Range   Glucose-Capillary 134 (*) 70 - 99 mg/dL   Comment 1 Notify RN    GLUCOSE, CAPILLARY     Status: Abnormal   Collection Time    07/01/14 11:49 PM      Result Value Ref Range   Glucose-Capillary 102 (*) 70 - 99 mg/dL  BASIC METABOLIC PANEL     Status: Abnormal   Collection Time    07/02/14  3:05 AM      Result Value Ref Range   Sodium 138  137 - 147 mEq/L   Potassium 3.9  3.7 - 5.3 mEq/L   Chloride 105  96 - 112 mEq/L   CO2 20  19 - 32 mEq/L   Glucose, Bld 135 (*) 70 - 99 mg/dL   BUN 22  6 - 23 mg/dL   Creatinine, Ser 7.82 (*) 0.50 - 1.10 mg/dL   Calcium 8.6  8.4 - 95.6 mg/dL   GFR calc non Af Amer 40 (*) >90 mL/min   GFR calc Af Amer 46 (*) >90 mL/min   Anion gap 13  5 -  15  CBC     Status: None   Collection Time    07/02/14  3:05 AM      Result Value Ref Range   WBC 8.5  4.0 - 10.5 K/uL   RBC 4.41  3.87 - 5.11 MIL/uL   Hemoglobin 12.2  12.0 - 15.0 g/dL   HCT 21.3  08.6 - 57.8 %   MCV 85.5  78.0 - 100.0 fL   MCH 27.7  26.0 - 34.0 pg   MCHC 32.4  30.0 - 36.0 g/dL   RDW 16.115.0  09.611.5 - 04.515.5 %   Platelets 173  150 - 400 K/uL  GLUCOSE, CAPILLARY     Status: Abnormal   Collection Time    07/02/14  3:50 AM      Result Value Ref Range   Glucose-Capillary 140 (*) 70 - 99 mg/dL  GLUCOSE, CAPILLARY     Status: Abnormal   Collection Time    07/02/14  8:15 AM      Result Value Ref Range   Glucose-Capillary 104 (*) 70 - 99 mg/dL   Comment 1 Notify RN     Comment 2 Documented in Chart     No results found.  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 Does the need for close, 24 hr/day medical supervision in concert with the patient's rehab needs make it unreasonable for this patient to be served in a less intensive setting? Yes 2. Co-Morbidities requiring supervision/potential complications: Polycystic kidney disease, history of polysubstance abuse 3. Due to bladder management, bowel management, safety, skin/wound care, disease management, medication administration, pain management and patient education, does the patient require 24 hr/day rehab nursing? Yes 4. Does the patient require coordinated care of a physician, rehab nurse, PT (1-2 hrs/day, 5 days/week) and OT (1-2 hrs/day, 5 days/week) to address physical and functional deficits in the context of the above medical diagnosis(es)? Yes Addressing deficits in the following areas: balance, endurance, locomotion, strength, transferring, bowel/bladder control, bathing, dressing and toileting 5. Can the patient actively participate in an intensive therapy program of at least 3 hrs of therapy per day at least 5 days per week? Yes and  Potentially 6. The potential for patient to make measurable gains while on inpatient rehab is good 7. Anticipated functional outcomes upon discharge from inpatient rehab are min assist  with PT, min assist with OT, n/a with SLP. 8. Estimated rehab length of stay to reach the above functional goals is: 15-18days 9. Does the patient have adequate social supports to accommodate these discharge functional goals? Yes 10. Anticipated D/C setting: Home 11. Anticipated post D/C treatments: Home excercise program 12. Overall Rehab/Functional Prognosis: good  RECOMMENDATIONS: This patient's condition is appropriate for continued rehabilitative care in the following setting: CIR Patient has agreed to participate in recommended program. Potentially Note that insurance prior authorization may be required for reimbursement for recommended care.  Comment: daughters young but appear supportive, has steps to enter home    07/02/2014

## 2014-07-02 NOTE — Progress Notes (Signed)
Anesthesiology:  Awake and alert as noted some neuro improvement since spinal drain inserted.  Spinal drainage catheter removed without difficulty, dressing applied.  Charlotte [name]

## 2014-07-02 NOTE — Progress Notes (Signed)
Subjective: No significant change in LE weakness. Drain was removed earlier today.   Exam: Filed Vitals:   07/02/14 1300  BP: 121/61  Pulse: 80  Temp:   Resp: 26   Gen: In bed, NAD MS: Awake, alert ZO:XWRUECN:PERRL, EOMI Motor: able to move feet with 2/5 strength. Unable to lift thighs, but does have some voluntary contraction. Better hip extension than flexion.  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 She does appear to have had some improvement in LE weakness compared to exam documented by Dr. Roseanne RenoStewart initially, however continues to be markedly impaired.    Recommendations: 1) Management of aortic dissection including BP goals per CT surgery.  2) PT, OT for LE weakness 3) No further recommendations at this time. Please call with any further questions or concerns.   Ritta SlotMcNeill Charlotte Schexnider, MD [facility] [phone]  If 7pm- 7am, please page neurology on call as listed in AMION.

## 2014-07-02 NOTE — Progress Notes (Signed)
Lumbar drain removed by Dr. Kipp Broodavid Joslin.  Site clean, dry, intact.  Gauze dressing. Charlotte [name]

## 2014-07-02 NOTE — Evaluation (Signed)
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 She does appear to have had some improvement in LE weakness compared to exam documented by Dr. Roseanne RenoStewart initially, however continues to be markedly impaired.   Clinical Impression  Patient demonstrates deficits in functional mobility as indicated below. Will need continued skilled PT to address deficits and maximize function. Will see as indicated and progress as tolerated. At this time patient and family educated extensively regarding safety with mobility. Patient educated on importance of vitals management and BP assessment with mobility. Patient positioned EOB and monitored vitals. Educated on trunk control and positioning for transfer. [facility]. Demonstrates ability to take weight through BLEs and also able to move and reposition BLEs with increased effort. At this time feel patient would benefit ideally from CIR upon acute discharge.     Follow Up Recommendations CIR;Supervision/Assistance - 24 hour    Equipment Recommendations  Other (comment) (TBD)    Recommendations for Other Services       Precautions / Restrictions Precautions Precautions: Fall Restrictions Weight Bearing Restrictions: No      Mobility  Bed Mobility Overal bed mobility: Needs Assistance Bed Mobility: Rolling;Sidelying to Sit Rolling: Mod assist Sidelying to sit: Min assist;+2 for physical assistance       General bed mobility comments: assist for support to come to upright, VCs for sequencing and positioning, Educated for safety secondary to impulsivity  Transfers Overall transfer level: Needs assistance Equipment used:  (face to face transfer with gait pad and +2 assist) Transfers: Sit to/from [employer]Stand;Squat Pivot Transfers Sit to Stand: Max assist   Squat  pivot transfers: Max assist     General transfer comment: +2 to guide hips for safety and line management  Ambulation/Gait                Stairs            Wheelchair Mobility    Modified Rankin (Stroke Patients Only)       Balance                                             Pertinent Vitals/Pain Pain Assessment: 0-10 Pain Score: 7  Pain Location: headache Pain Descriptors / Indicators: Aching Pain Intervention(s): Monitored during session;Relaxation;Repositioned    Home Living Family/patient expects to be discharged to:: Inpatient rehab Living Arrangements: Children Available Help at Discharge: Family Type of Home: House Home Access: Stairs to enter Entrance Stairs-Rails: None Secretary/administratorntrance Stairs-Number of Steps: 1 Home Layout: One level Home Equipment: None Additional Comments: daughters at bedside, state that she will come stay with them post discharge    Prior Function Level of Independence: Independent with assistive device(s)               Hand Dominance   Dominant Hand: Right    Extremity/Trunk Assessment   Upper Extremity Assessment: Overall WFL for tasks assessed           Lower Extremity Assessment: Generalized weakness;RLE deficits/detail;LLE deficits/detail RLE Deficits / Details: weakness with testing, 1-2/5 all motions LLE Deficits / Details: weakness with testing, 1-2/5 all motions     Communication      Cognition Arousal/Alertness: Awake/alert Behavior During Therapy: Anxious;Flat affect;Impulsive Overall  Cognitive Status: Within Functional Limits for tasks assessed                      General Comments General comments (skin integrity, edema, etc.): monitoring of BP in varying positions throughout session.    Exercises        Assessment/Plan    PT Assessment Patient needs continued PT services  PT Diagnosis Difficulty walking;Abnormality of gait;Generalized weakness;Acute pain    PT Problem List Decreased strength;Decreased range of motion;Decreased activity tolerance;Decreased balance;Decreased mobility;Decreased knowledge of use of DME;Decreased safety awareness;Pain  PT Treatment Interventions DME instruction;Gait training;Functional mobility training;Therapeutic activities;Therapeutic exercise;Balance training;Neuromuscular re-education;Patient/family education;Wheelchair mobility training   PT Goals (Current goals can be found in the Care Plan section) Acute Rehab PT Goals Patient Stated Goal: to be independent PT Goal Formulation: With patient/family Time For Goal Achievement: 07/16/14 Potential to Achieve Goals: Fair    Frequency Min 4X/week   Barriers to discharge        Co-evaluation               End of Session Equipment Utilized During Treatment: Gait belt Activity Tolerance: Patient tolerated treatment well;Patient limited by fatigue;Patient limited by pain Patient left: in chair;with call bell/phone within reach;with family/visitor present Nurse Communication: Mobility status         Time: 7829-5621 PT Time Calculation (min): 54 min   Charges:   PT Evaluation $Initial PT Evaluation Tier I: 1 Procedure PT Treatments $Therapeutic Activity: 23-37 mins $Self Care/Home Management: 8-22   PT G CodesFabio Asa 07/02/2014, 3:51 PM Charlotte Crumb, PT DPT  [phone]

## 2014-07-02 NOTE — Progress Notes (Signed)
PULMONARY / CRITICAL CARE MEDICINE   Name: Charlotte [name] MRN: [id number] DOB: 11/06/1966    ADMISSION DATE:  06/29/2014  REFERRING MD : EDP  CHIEF COMPLAINT:  Inability to walk, chest pain  INITIAL PRESENTATION:  48 yo female smoker with weakness, dyspnea and chest pain.  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  STUDIES:  8/08 CT chest >> type B aortic dissection from Lt [place] artery 8/08 CT abd/pelvis >> dissection extends to aortic bifurcation 8/09 Echo >> EF 60-65, mild to mod LVH, mild diastolic dysfcn   SIGNIFICANT EVENTS: 8/08 Admit, TCTS/neurology consulted, spinal drain placed by anesthesia 8/11 >> spinal drain removed  SUBJECTIVE:  Doing well this morning, moderate headache, but appropriate, improved lower extremity sensation. Family at bedside, they were updated.   VITAL SIGNS: Temp:  [97.7 F (36.5 C)-99.3 F (37.4 C)] 98.3 F (36.8 C) (08/11 0800) Pulse Rate:  [55-78] 70 (08/11 0900) Resp:  [11-29] 23 (08/11 0900) BP: (101-138)/(55-76) 125/64 mmHg (08/11 0900) SpO2:  [89 %-96 %] 96 % (08/11 0900) Weight:  [149 lb 4 oz (67.7 kg)] 149 lb 4 oz (67.7 kg) (08/11 0300) INTAKE / OUTPUT:  Intake/Output Summary (Last 24 hours) at 07/02/14 1014 Last data filed at 07/02/14 0930  Gross per 24 hour  Intake 2734.33 ml  Output    923 ml  Net 1811.33 ml    PHYSICAL EXAMINATION: General: laying flat in bed Neuro: improved sensation in lower extremities, 4/5 strength HEENT: no sinus tenderness Cardiovascular: regular, no murmur Lungs: no wheeze Abdomen: soft, non tender Musculoskeletal: no edema Skin: no rashes  LABS:  CBC  Recent Labs Lab 06/30/14 0255 07/01/14 0350 07/02/14 0305  WBC 11.1* 8.7 8.5  HGB 14.1 12.3 12.2  HCT 42.7 38.7 37.7  PLT 198 180 173   Coag's  Recent Labs Lab 06/30/14 0255  APTT 34  INR 1.10   BMET  Recent Labs Lab 06/30/14 0303 07/01/14 0350  07/02/14 0305  NA 145 144 138  K 3.7 3.5* 3.9  CL 104 108 105  CO2 21 21 20   BUN 18 23 22   CREATININE 1.67* 1.60* 1.49*  GLUCOSE 156* 102* 135*   Electrolytes  Recent Labs Lab 06/30/14 0303 07/01/14 0350 07/02/14 0305  CALCIUM 9.5 8.9 8.6  MG 1.8  --   --   PHOS 3.7  --   --    Sepsis Markers  Recent Labs Lab 06/30/14 0255  LATICACIDVEN 4.7*   Liver Enzymes  Recent Labs Lab 06/30/14 0303  AST 18  ALT 16  ALKPHOS 93  BILITOT 0.2*  ALBUMIN 3.7   Glucose  Recent Labs Lab 07/01/14 1152 07/01/14 1549 07/01/14 2008 07/01/14 2349 07/02/14 0350 07/02/14 0815  GLUCAP 99 114* 134* 102* 140* 104*    Imaging No results found.  ECHO TTE 8/9 Study Conclusions  - Left ventricle: The cavity size was normal. There was moderate concentric hypertrophy. Systolic function was normal. The estimated ejection fraction was in the range of 60% to 65%. Wall motion was normal; there were no regional wall motion abnormalities. Doppler parameters are consistent with abnormal left ventricular relaxation (grade 1 diastolic dysfunction). The E/e&' ratio is <8, suggesting normal LV filling pressure. - Aorta: No clear dissection flap visualized in this study. The aortic arch size is generous, the descending aorta is well visualized and no evidence for dissection is noted. - Left atrium: The atrium was normal in size. -  Tricuspid valve: There was mild regurgitation. - Pulmonary arteries: PA peak pressure: 26 mm Hg (S). - Pericardium, extracardiac: There was no pericardial effusion.  Impressions:  - LVEF 60-65%, moderate concentric LVH, normal LA size, generous aortic arch without clear visualization of a dissection flap, normal descending aorta, diastolic dysfunction, normal LV fililng pressure, no pericardial effusion.   ASSESSMENT / PLAN:  PULMONARY A:  Hx of tobacco use. P:   Smoking cessation  CARDIOVASCULAR A:  Type B aortic dissection. Hx of  HTN. Transient A fib prior to admission. P:  Goal SBP < 120, HR < 70 for aortic dissection, however in lieu of her spinal issues, might need higher pressures, will require long term BP goals as we are getting ready to transition to PO meds, will follow CTS recs.  Continue labetalol/nicardipine gtt  RENAL A:   AKI >> no recent labs for baseline renal fx. >> improving from addmission Hypokalemia. P:   Monitor renal fx, urine outpt, electrolytes replacement  GASTROINTESTINAL A:   Nutrition. P:   Start soft diet, adv as tolerated  HEMATOLOGIC A:   No acute issues. P:  F/u CBC SCD for DVT prevention  INFECTIOUS A:  No evidence for infection. P:   Monitor clinically  ENDOCRINE A:  Hyperglycemia. 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 Headache P:   Spinal drain per TCTS - appreciate consult, long term BP goal?? PT\OT possibly neurorehab evaluation once spinal drain is removed.   Tylenol   Family updated at bedside  CC time 35 minutes   Stephanie AcreVishal Daking Westervelt, MD [facility] Pager [phone] On Call Pager [phone]

## 2014-07-02 NOTE — Progress Notes (Signed)
Patient ID: Rowe RobertHelene J Salata, female   DOB: 08-12-1966, 48 y.o.   MRN: [id number] TCTS DAILY ICU PROGRESS NOTE                   [address]            [place] [id number]          [phone]        Total Length of Stay:  LOS: 3 days   Subjective: Says has more feeling in legs today  Objective: Vital signs in last 24 hours: Temp:  [98.3 F (36.8 C)-99.3 F (37.4 C)] 99 F (37.2 C) (08/11 1622) Pulse Rate:  [62-80] 80 (08/11 1300) Cardiac Rhythm:  [-] Normal sinus rhythm (08/11 0900) Resp:  [11-29] 26 (08/11 1300) BP: (101-138)/(55-98) 121/61 mmHg (08/11 1300) SpO2:  [90 %-98 %] 91 % (08/11 1300) Weight:  [149 lb 4 oz (67.7 kg)] 149 lb 4 oz (67.7 kg) (08/11 0300)  Filed Weights   06/30/14 0336 07/01/14 0400 07/02/14 0300  Weight: 146 lb 13.2 oz (66.6 kg) 150 lb 9.2 oz (68.3 kg) 149 lb 4 oz (67.7 kg)    Weight change: -1 lb 5.2 oz (-0.6 kg)   Hemodynamic parameters for last 24 hours:    Intake/Output from previous day: 08/10 0701 - 08/11 0700 In: 2991.4 [P.O.:1180; I.V.:1811.4] Out: 897 [Urine:805; Drains:92]  Intake/Output this shift: Total I/O In: 494.2 [I.V.:494.2] Out: 171 [Urine:150; Drains:21]  Current Meds: Scheduled Meds: . docusate sodium  100 mg Oral Daily  . insulin aspart  0-15 Units Subcutaneous 6 times per day  . pneumococcal 23 valent vaccine  0.5 mL Intramuscular Tomorrow-1000  . potassium chloride  40 mEq Oral BID   Continuous Infusions: . labetalol (NORMODYNE) infusion 1 mg/min (07/02/14 1300)  . niCARDipine 5 mg/hr (07/02/14 1300)   PRN Meds:.sodium chloride, acetaminophen, albuterol, alum & mag hydroxide-simeth, HYDROmorphone (DILAUDID) injection, ondansetron  General appearance: alert, cooperative and no distress Neurologic: movement in legs , improved sensation , reistance to movement with upper legs when had been flacid  Heart: regular rate and rhythm, S1, S2 normal, no murmur, click, rub or gallop Lungs: clear to  auscultation bilaterally Abdomen: soft, non-tender; bowel sounds normal; no masses,  no organomegaly Extremities: extremities normal, atraumatic, no cyanosis or edema and Homans sign is negative, no sign of DVT   Lab Results: CBC: Recent Labs  07/01/14 0350 07/02/14 0305  WBC 8.7 8.5  HGB 12.3 12.2  HCT 38.7 37.7  PLT 180 173   BMET:  Recent Labs  07/01/14 0350 07/02/14 0305  NA 144 138  K 3.5* 3.9  CL 108 105  CO2 21 20  GLUCOSE 102* 135*  BUN 23 22  CREATININE 1.60* 1.49*  CALCIUM 8.9 8.6    PT/INR:  Recent Labs  06/30/14 0255  LABPROT 14.2  INR 1.10   Radiology: No results found.   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

## 2014-07-03 DIAGNOSIS — D649 Anemia, unspecified: Secondary | ICD-10-CM

## 2014-07-03 DIAGNOSIS — F1410 Cocaine abuse, uncomplicated: Secondary | ICD-10-CM

## 2014-07-03 LAB — BASIC METABOLIC PANEL
Anion gap: 11 (ref 5–15)
BUN: 24 mg/dL — ABNORMAL HIGH (ref 6–23)
CALCIUM: 8.4 mg/dL (ref 8.4–10.5)
CO2: 19 meq/L (ref 19–32)
Chloride: 107 mEq/L (ref 96–112)
Creatinine, Ser: 1.52 mg/dL — ABNORMAL HIGH (ref 0.50–1.10)
GFR calc Af Amer: 45 mL/min — ABNORMAL LOW (ref >90)
GFR calc non Af Amer: 39 mL/min — ABNORMAL LOW (ref >90)
GLUCOSE: 113 mg/dL — AB (ref 70–99)
Potassium: 4.3 mEq/L (ref 3.7–5.3)
SODIUM: 137 meq/L (ref 137–147)

## 2014-07-03 LAB — CBC
HEMATOCRIT: 32.8 % — AB (ref 36.0–46.0)
HEMOGLOBIN: 10.8 g/dL — AB (ref 12.0–15.0)
MCH: 27.8 pg (ref 26.0–34.0)
MCHC: 32.9 g/dL (ref 30.0–36.0)
MCV: 84.3 fL (ref 78.0–100.0)
Platelets: 170 10*3/uL (ref 150–400)
RBC: 3.89 MIL/uL (ref 3.87–5.11)
RDW: 14.9 % (ref 11.5–15.5)
WBC: 8.3 10*3/uL (ref 4.0–10.5)

## 2014-07-03 LAB — GLUCOSE, CAPILLARY
GLUCOSE-CAPILLARY: 121 mg/dL — AB (ref 70–99)
Glucose-Capillary: 105 mg/dL — ABNORMAL HIGH (ref 70–99)
Glucose-Capillary: 102 mg/dL — ABNORMAL HIGH (ref 70–99)
Glucose-Capillary: 106 mg/dL — ABNORMAL HIGH (ref 70–99)
Glucose-Capillary: 116 mg/dL — ABNORMAL HIGH (ref 70–99)

## 2014-07-03 MED ORDER — LABETALOL HCL 200 MG PO TABS
200.0000 mg | ORAL_TABLET | Freq: Two times a day (BID) | ORAL | Status: DC
  Administered 2014-07-03 (×2): 200 mg via ORAL
  Filled 2014-07-03 (×2): qty 1

## 2014-07-03 MED ORDER — CETYLPYRIDINIUM CHLORIDE 0.05 % MT LIQD
7.0000 mL | Freq: Two times a day (BID) | OROMUCOSAL | Status: DC
  Administered 2014-07-03 – 2014-07-08 (×8): 7 mL via OROMUCOSAL

## 2014-07-03 MED ORDER — LABETALOL HCL 300 MG PO TABS
300.0000 mg | ORAL_TABLET | Freq: Two times a day (BID) | ORAL | Status: DC
  Filled 2014-07-03 (×2): qty 1

## 2014-07-03 NOTE — Progress Notes (Signed)
I met with pt and her daughter and sister at bedside. We discussed an inpt rehab admission once pt medically ready. They are in agreement. I have contacted financial counselor yesterday to request follow up with a previous medicaid application and to request a disability application. I will follow her progress. [phone]

## 2014-07-03 NOTE — Progress Notes (Signed)
PULMONARY / CRITICAL CARE MEDICINE   Name: Rowe RobertHelene J Morrish MRN: [id number] DOB: [birth year]    ADMISSION DATE:  06/29/2014  REFERRING MD : EDP  CHIEF COMPLAINT:  Inability to walk, chest pain  INITIAL PRESENTATION:  48 yo female smoker with weakness, dyspnea and chest pain.  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  STUDIES:  8/08 CT chest >> type B aortic dissection from Lt [place] artery 8/08 CT abd/pelvis >> dissection extends to aortic bifurcation 8/09 Echo >> EF 60-65, mild to mod LVH, mild diastolic dysfcn  SIGNIFICANT EVENTS: 8/08 Admit, TCTS/neurology consulted, spinal drain placed by anesthesia 8/11 >> spinal drain removed 8/12 remains on nicardipine, labetalol drip  SUBJECTIVE:  Drips remain  VITAL SIGNS: Temp:  [98.3 F (36.8 C)-99.3 F (37.4 C)] 98.3 F (36.8 C) (08/12 0800) Pulse Rate:  [65-91] 71 (08/12 0945) Resp:  [15-32] 22 (08/12 0945) BP: (104-127)/(50-82) 113/68 mmHg (08/12 0945) SpO2:  [89 %-98 %] 95 % (08/12 0945) Weight:  [72.7 kg (160 lb 4.4 oz)] 72.7 kg (160 lb 4.4 oz) (08/12 0500) INTAKE / OUTPUT:  Intake/Output Summary (Last 24 hours) at 07/03/14 1020 Last data filed at 07/03/14 0900  Gross per 24 hour  Intake 2068.66 ml  Output   1060 ml  Net 1008.66 ml    PHYSICAL EXAMINATION: General: in chair Neuro: daily improved sensation in lower extremities, 4/5 strength uppers, lowers weak 1-3 HEENT: jvd wnl Cardiovascular: s1 s2 regular, no murmur Lungs: CTA Abdomen: soft, non tender Musculoskeletal: no edema Skin: no rashes  LABS:  CBC  Recent Labs Lab 07/01/14 0350 07/02/14 0305 07/03/14 0228  WBC 8.7 8.5 8.3  HGB 12.3 12.2 10.8*  HCT 38.7 37.7 32.8*  PLT 180 173 170   Coag's  Recent Labs Lab 06/30/14 0255  APTT 34  INR 1.10   BMET  Recent Labs Lab 07/01/14 0350 07/02/14 0305 07/03/14 0228  NA 144 138 137  K 3.5* 3.9 4.3  CL 108 105 107  CO2 21 20  19   BUN 23 22 24*  CREATININE 1.60* 1.49* 1.52*  GLUCOSE 102* 135* 113*   Electrolytes  Recent Labs Lab 06/30/14 0303 07/01/14 0350 07/02/14 0305 07/03/14 0228  CALCIUM 9.5 8.9 8.6 8.4  MG 1.8  --   --   --   PHOS 3.7  --   --   --    Sepsis Markers  Recent Labs Lab 06/30/14 0255  LATICACIDVEN 4.7*   Liver Enzymes  Recent Labs Lab 06/30/14 0303  AST 18  ALT 16  ALKPHOS 93  BILITOT 0.2*  ALBUMIN 3.7   Glucose  Recent Labs Lab 07/02/14 1151 07/02/14 1610 07/02/14 1917 07/02/14 2356 07/03/14 0305 07/03/14 0743  GLUCAP 108* 122* 124* 146* 105* 102*    Imaging No results found.  ECHO TTE 8/9 - LVEF 60-65%, moderate concentric LVH, normal LA size, generous aortic arch without clear visualization of a dissection flap, normal descending aorta, diastolic dysfunction, normal LV fililng pressure, no pericardial effusion.   ASSESSMENT / PLAN:  PULMONARY A:  Hx of tobacco use. P:   Monitor sats, goal to reduce to RA Repeat pcxr IS  CARDIOVASCULAR A:  Type B aortic dissection. Hx of HTN. Transient A fib prior to admission. P:  Goal SBP < 120, HR < 70 for aortic dissection, however in lieu of her spinal issues, might need higher pressures, especially as been 5 days since admission, cosnider  130 sys as well Continue labetalol/nicardipine gtt, but add orals to dc at least one IV agent ( prefer to dc nicardipine) Add orals  Avoid additon prior BP meds with now type dissection and need beta blocker preference  RENAL A:   AKI slow resolution Hypokalemia resolved Small NON AG  P:   Dc saline as able Follow bicarb May need small amount bicarb, chem in am Repeat lactic (can have type b)  GASTROINTESTINAL A:   Nutrition. P:   soft diet, adv as tolerated  HEMATOLOGIC A:   dvt prevention P:  F/u CBC in am  SCD for DVT prevention  INFECTIOUS A:  No evidence for infection. P:   Monitor clinically  ENDOCRINE A:  Hyperglycemia. 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 Headache Bipolar dz? P:   PT\OT active  Tylenol consider psych eval as has had reactions to home psych meds  Family updated at bedside  Ccm time 30 min   Mcarthur Rossetti. Tyson Alias, MD, FACP Pgr: [phone] [facility]

## 2014-07-03 NOTE — Evaluation (Addendum)
Occupational Therapy Evaluation Patient Details Name: Charlotte [name] MRN: [id number] DOB: 27-Sep-1966 Today's Date: 07/03/2014    History of Present Illness 48 yo F with spinal cord ischemia secondary to aortic dissection. She does appear to have had some improvement in LE weakness compared to exam documented by Dr. Roseanne Reno initially, however continues to be markedly impaired.    Clinical Impression   Pt currently presents with moderate weakness in her LEs with poor sitting and standing balance.  She needs total +2 assistance for LB selfcare, toileting, and functional transfers.  O2 sats decreasing into the low 80's with activity on 3ls nasal cannula, needing increase to 4 Ls to bring back up to 92%.  BP stable as well maintaining 109/51, 117/74, and 106/82. Pt with increased agitation initially to begin session, not wanting to get up and stating she was getting out of her today and her daughter would take her home.  Eventually she was able to be re-directed and participated in PT/OT co-tx.  Pt will benefit from acute care OT to begin rehab process toward regaining her selfcare independence.  Feel pt will need comprehensive OT via CIR level to further progress.  Pt will likely need to go home with daughter post hospital stay.     Follow Up Recommendations  CIR    Equipment Recommendations  3 in 1 bedside comode;Tub/shower bench    Recommendations for Other Services Rehab consult     Precautions / Restrictions Precautions Precautions: Fall Restrictions Weight Bearing Restrictions: No      Mobility Bed Mobility Overal bed mobility: Needs Assistance Bed Mobility: Supine to Sit     Supine to sit: +2 for physical assistance;Mod assist     General bed mobility comments: Pt required assist to bring her trunk into sitting from sidelying.  Transfers Overall transfer level: Needs assistance   Transfers: Sit to/from Stand;Stand Pivot Transfers Sit to Stand: +2 physical assistance;Max  assist Stand pivot transfers: +2 physical assistance;Max assist       General transfer comment: Pt unable to advance her LEs during transfer with right being more impaired than the left.    Balance Overall balance assessment: Needs assistance   Sitting balance-Leahy Scale: Poor Sitting balance - Comments: Pt with posterior LOB when attempting to cross her LEs for dressing.     Standing balance-Leahy Scale: Poor Standing balance comment: Pt unable to support her weight with her LEs in standing demonstrating bilateral knee buckling.                            ADL Overall ADL's : Needs assistance/impaired Eating/Feeding: Set up;Sitting   Grooming: Wash/dry hands;Wash/dry face;Set up;Sitting Grooming Details (indicate cue type and reason): supported sitting Upper Body Bathing: Supervision/ safety;Sitting Upper Body Bathing Details (indicate cue type and reason): supported sitting Lower Body Bathing: Total assistance;+2 for physical assistance;Sit to/from stand   Upper Body Dressing : Minimal assistance;Sitting   Lower Body Dressing: Sit to/from stand;+2 for physical assistance;Total assistance Lower Body Dressing Details (indicate cue type and reason): Pt required assistance to cross the LLE over the right for donning her sock.  She could not tolerate crossing the RLE over the left. Toilet Transfer: +2 for physical assistance;Maximal assistance;BSC   Toileting- Clothing Manipulation and Hygiene: Total assistance;+2 for physical assistance;Sit to/from stand       Functional mobility during ADLs: +2 for physical assistance;Maximal assistance General ADL Comments: Pt with limited endurance for selfcare tasks and needs +  2 for sit to stand and toileting tasks.  Decreased ability to support her weight with her LEs during standing or with transfer.        Perception Perception Perception Tested?: No   Praxis Praxis Praxis tested?: Not tested    Pertinent Vitals/Pain  Pain Assessment: No/denies pain     Hand Dominance Right   Extremity/Trunk Assessment Upper Extremity Assessment Upper Extremity Assessment: Overall WFL for tasks assessed   Lower Extremity Assessment Lower Extremity Assessment: Defer to PT evaluation   Cervical / Trunk Assessment Cervical / Trunk Assessment: Normal   Communication     Cognition Arousal/Alertness: Awake/alert Behavior During Therapy: Agitated;Anxious Overall Cognitive Status: Impaired/Different from baseline Area of Impairment: Awareness;Problem solving;Safety/judgement         Safety/Judgement: Decreased awareness of safety;Decreased awareness of deficits Awareness: Intellectual Problem Solving: Slow processing;Requires verbal cues General Comments: Pt agitated iniitally and not wanting to get up stated her daughter would come today and take her home she didn't need therapy.  Eventually she was able to calm down and participate however.              Home Living Family/patient expects to be discharged to:: Inpatient rehab Living Arrangements: Children Available Help at Discharge: Family Type of Home: House Home Access: Stairs to enter Secretary/administratorntrance Stairs-Number of Steps: 1 Entrance Stairs-Rails: None Home Layout: One level               Home Equipment: None   Additional Comments: daughters at bedside, state that she will come stay with them post discharge      Prior Functioning/Environment Level of Independence: Independent with assistive device(s)             OT Diagnosis: Generalized weakness;Acute pain;Cognitive deficits   OT Problem List: Decreased strength;Decreased activity tolerance;Impaired balance (sitting and/or standing);Decreased safety awareness;Cardiopulmonary status limiting activity;Decreased knowledge of use of DME or AE   OT Treatment/Interventions: Self-care/ADL training;Balance training;Patient/family education;Therapeutic activities;DME and/or AE  instruction;Neuromuscular education;Therapeutic exercise;Cognitive remediation/compensation    OT Goals(Current goals can be found in the care plan section) Acute Rehab OT Goals Patient Stated Goal: to be independent OT Goal Formulation: With patient Time For Goal Achievement: 07/17/14 Potential to Achieve Goals: Good  OT Frequency: Min 3X/week           Co-evaluation PT/OT/SLP Co-Evaluation/Treatment: Yes Reason for Co-Treatment: Complexity of the patient's impairments (multi-system involvement);For patient/therapist safety   OT goals addressed during session: Proper use of Adaptive equipment and DME;ADL's and self-care      End of Session Equipment Utilized During Treatment: Oxygen Nurse Communication: Mobility status  Activity Tolerance: Patient limited by pain Patient left: in chair;with call bell/phone within reach   Time: 0820-0903 OT Time Calculation (min): 43 min Charges:  OT General Charges $OT Visit: 1 Procedure OT Evaluation $Initial OT Evaluation Tier I: 1 Procedure OT Treatments $Self Care/Home Management : 8-22 mins G-Codes:    Anibal Quinby OTR/L 07/03/2014, 11:11 AM

## 2014-07-03 NOTE — Progress Notes (Addendum)
      [address]       [place] [id number]             [phone]      Subjective:  [name] has no new complaints.  Continues to regain feeling in Lower Extremities.  Objective: Vital signs in last 24 hours: Temp:  [98.3 F (36.8 C)-99.3 F (37.4 C)] 98.3 F (36.8 C) (08/12 0800) Pulse Rate:  [65-91] 71 (08/12 0930) Cardiac Rhythm:  [-] Normal sinus rhythm (08/12 0800) Resp:  [15-32] 15 (08/12 0930) BP: (104-127)/(50-82) 113/67 mmHg (08/12 0930) SpO2:  [89 %-98 %] 93 % (08/12 0930) Weight:  [160 lb 4.4 oz (72.7 kg)] 160 lb 4.4 oz (72.7 kg) (08/12 0500)  Intake/Output from previous day: 08/11 0701 - 08/12 0700 In: 2195.3 [P.O.:240; I.V.:1955.3] Out: 1271 [Urine:1000; Emesis/NG output:250; Drains:21] Intake/Output this shift: Total I/O In: 112.5 [I.V.:112.5] Out: -   General appearance: alert, cooperative and no distress Heart: regular rate and rhythm Lungs: clear to auscultation bilaterally Extremities: extremities normal, atraumatic, no cyanosis or edema  Lab Results:  Recent Labs  07/02/14 0305 07/03/14 0228  WBC 8.5 8.3  HGB 12.2 10.8*  HCT 37.7 32.8*  PLT 173 170   BMET:  Recent Labs  07/02/14 0305 07/03/14 0228  NA 138 137  K 3.9 4.3  CL 105 107  CO2 20 19  GLUCOSE 135* 113*  BUN 22 24*  CREATININE 1.49* 1.52*  CALCIUM 8.6 8.4    PT/INR: No results found for this basename: LABPROT, INR,  in the last 72 hours ABG    Component Value Date/Time   TCO2 22 06/29/2014 1653   CBG (last 3)   Recent Labs  07/02/14 2356 07/03/14 0305 07/03/14 0743  GLUCAP 146* 105* 102*    Assessment/Plan:  1. S/P Type III Aortic Dissection with spinal cord ischemic injury- spinal drain has been removed 2. CV- blood pressure well controlled 3. Deconditioning- paralysis improving, continue PT/OT 4. Dispo- care per primary, rehab as able   LOS: 4 days    Lowella DandyBARRETT, ERIN 07/03/2014  Working toward rehab placement I have seen and  examined Charlotte RobertHelene J [name] and agree with the above assessment  and plan.  Delight OvensEdward B Kerrilynn Derenzo MD [facility] [phone] Office [phone] 07/03/2014 7:21 PM

## 2014-07-03 NOTE — Progress Notes (Signed)
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 She does appear to have had some improvement in LE weakness compared to exam documented by Dr. Roseanne Reno initially, however continues to be markedly impaired.     PT Comments    Patient seen for therapies this am. Patient initially very reluctant to participate with decreased awareness of deficits, stated she was going to go home.  Max cues for encouragement. Patient agreeable and assited to EOB. Trunk control activities performed EOB with assist. Multiple transfers (to bsc and to chair) performed with max assist. Noted increased AROM in RLE (knee extension through range, but could not maintain against resistance 3/5 while seated EOB). BP monitored throughout session, goal SBP <120. Patient Bp at it's highest was 117/74 with HR 78.  Spo2 on 3 liters 83% at lowest. Patient with some lability at conclusion of session, states she is frustrated. Will continue to see and progress as tolerated.     Follow Up Recommendations  CIR;Supervision/Assistance - 24 hour     Equipment Recommendations  Other (comment) (TBD)    Recommendations for Other Services       Precautions / Restrictions Precautions Precautions: Fall Restrictions Weight Bearing Restrictions: No    Mobility  Bed Mobility Overal bed mobility: Needs Assistance Bed Mobility: Supine to Sit Rolling: Mod assist Sidelying to sit: Min assist;+2 for physical assistance Supine to sit: +2 for physical assistance;Mod assist     General bed mobility comments: Pt required assist to bring her trunk into sitting from sidelying.  Transfers Overall transfer level: Needs assistance Equipment used:  (face to face transfer with gait pad and +2 assist) Transfers: Sit to/from Stand;Stand Pivot Transfers Sit to Stand: +2 physical  assistance;Max assist Stand pivot transfers: +2 physical assistance;Max assist Squat pivot transfers: Max assist     General transfer comment: Pt unable to advance her LEs during transfer with right being more impaired than the left.  Ambulation/Gait                 Stairs            Wheelchair Mobility    Modified Rankin (Stroke Patients Only)       Balance Overall balance assessment: Needs assistance   Sitting balance-Leahy Scale: Poor Sitting balance - Comments: Pt with posterior LOB when attempting to cross her LEs for dressing.     Standing balance-Leahy Scale: Poor Standing balance comment: Pt unable to support her weight with her LEs in standing demonstrating bilateral knee buckling.                    Cognition Arousal/Alertness: Awake/alert Behavior During Therapy: Agitated;Anxious Overall Cognitive Status: Impaired/Different from baseline Area of Impairment: Awareness;Problem solving;Safety/judgement         Safety/Judgement: Decreased awareness of safety;Decreased awareness of deficits Awareness: Intellectual Problem Solving: Slow processing;Requires verbal cues General Comments: Pt agitated iniitally and not wanting to get up stated her daughter would come today and take her home she didn't need therapy.  Eventually she was able to calm down and participate however.    Exercises      General Comments General comments (skin integrity, edema, etc.): BP monitored throughout session, goal SBP <120. Patient Bp at it's highest was 117/74 HR 78 Spo2 on 3 liters 83%.        Pertinent Vitals/Pain Pain  Assessment: No/denies pain    Home Living Family/patient expects to be discharged to:: Inpatient rehab Living Arrangements: Children Available Help at Discharge: Family Type of Home: House Home Access: Stairs to enter Entrance Stairs-Rails: None Home Layout: One level Home Equipment: None Additional Comments: daughters at bedside,  state that she will come stay with them post discharge    Prior Function Level of Independence: Independent with assistive device(s)          PT Goals (current goals can now be found in the care plan section) Acute Rehab PT Goals Patient Stated Goal: to be independent PT Goal Formulation: With patient/family Time For Goal Achievement: 07/16/14 Potential to Achieve Goals: Fair Progress towards PT goals: Progressing toward goals    Frequency  Min 4X/week    PT Plan Current plan remains appropriate    Co-evaluation PT/OT/SLP Co-Evaluation/Treatment: Yes Reason for Co-Treatment: Complexity of the patient's impairments (multi-system involvement) PT goals addressed during session: Mobility/safety with mobility OT goals addressed during session: Proper use of Adaptive equipment and DME;ADL's and self-care     End of Session Equipment Utilized During Treatment: Gait belt Activity Tolerance: Patient tolerated treatment well;Patient limited by fatigue;Patient limited by pain Patient left: in chair;with call bell/phone within reach;with family/visitor present     Time: 0820-0903 PT Time Calculation (min): 43 min  Charges:  $Therapeutic Activity: 23-37 mins                    G CodesFabio [name]:      Charlotte [name] J 07/03/2014, 11:41 AM Charlotte [name], PT DPT  [phone]

## 2014-07-04 ENCOUNTER — Inpatient Hospital Stay (HOSPITAL_COMMUNITY): Payer: Medicaid Other

## 2014-07-04 ENCOUNTER — Encounter (HOSPITAL_COMMUNITY): Payer: Self-pay | Admitting: Radiology

## 2014-07-04 LAB — CBC WITH DIFFERENTIAL/PLATELET
BASOS PCT: 0 % (ref 0–1)
Basophils Absolute: 0 10*3/uL (ref 0.0–0.1)
Eosinophils Absolute: 0.2 10*3/uL (ref 0.0–0.7)
Eosinophils Relative: 3 % (ref 0–5)
HCT: 31.5 % — ABNORMAL LOW (ref 36.0–46.0)
HEMOGLOBIN: 10.3 g/dL — AB (ref 12.0–15.0)
Lymphocytes Relative: 22 % (ref 12–46)
Lymphs Abs: 1.6 10*3/uL (ref 0.7–4.0)
MCH: 27.7 pg (ref 26.0–34.0)
MCHC: 32.7 g/dL (ref 30.0–36.0)
MCV: 84.7 fL (ref 78.0–100.0)
MONOS PCT: 8 % (ref 3–12)
Monocytes Absolute: 0.6 10*3/uL (ref 0.1–1.0)
NEUTROS PCT: 67 % (ref 43–77)
Neutro Abs: 4.8 10*3/uL (ref 1.7–7.7)
PLATELETS: 175 10*3/uL (ref 150–400)
RBC: 3.72 MIL/uL — ABNORMAL LOW (ref 3.87–5.11)
RDW: 15.1 % (ref 11.5–15.5)
WBC: 7.3 10*3/uL (ref 4.0–10.5)

## 2014-07-04 LAB — BASIC METABOLIC PANEL
Anion gap: 11 (ref 5–15)
BUN: 21 mg/dL (ref 6–23)
CHLORIDE: 108 meq/L (ref 96–112)
CO2: 20 mEq/L (ref 19–32)
Calcium: 8.6 mg/dL (ref 8.4–10.5)
Creatinine, Ser: 1.33 mg/dL — ABNORMAL HIGH (ref 0.50–1.10)
GFR calc Af Amer: 53 mL/min — ABNORMAL LOW (ref >90)
GFR calc non Af Amer: 46 mL/min — ABNORMAL LOW (ref >90)
Glucose, Bld: 107 mg/dL — ABNORMAL HIGH (ref 70–99)
POTASSIUM: 4.8 meq/L (ref 3.7–5.3)
SODIUM: 139 meq/L (ref 137–147)

## 2014-07-04 LAB — GLUCOSE, CAPILLARY
Glucose-Capillary: 102 mg/dL — ABNORMAL HIGH (ref 70–99)
Glucose-Capillary: 103 mg/dL — ABNORMAL HIGH (ref 70–99)
Glucose-Capillary: 135 mg/dL — ABNORMAL HIGH (ref 70–99)

## 2014-07-04 LAB — LACTIC ACID, PLASMA: LACTIC ACID, VENOUS: 0.8 mmol/L (ref 0.5–2.2)

## 2014-07-04 MED ORDER — HYDRALAZINE HCL 20 MG/ML IJ SOLN
INTRAMUSCULAR | Status: AC
  Administered 2014-07-04: 10 mg via INTRAVENOUS
  Filled 2014-07-04: qty 1

## 2014-07-04 MED ORDER — ENSURE COMPLETE PO LIQD
237.0000 mL | Freq: Three times a day (TID) | ORAL | Status: DC
  Administered 2014-07-04 – 2014-07-07 (×8): 237 mL via ORAL

## 2014-07-04 MED ORDER — MORPHINE SULFATE 2 MG/ML IJ SOLN
INTRAMUSCULAR | Status: AC
  Administered 2014-07-04: 2 mg via INTRAVENOUS
  Filled 2014-07-04: qty 1

## 2014-07-04 MED ORDER — IOHEXOL 350 MG/ML SOLN
100.0000 mL | Freq: Once | INTRAVENOUS | Status: AC | PRN
  Administered 2014-07-04: 100 mL via INTRAVENOUS

## 2014-07-04 MED ORDER — FUROSEMIDE 10 MG/ML IJ SOLN
20.0000 mg | Freq: Two times a day (BID) | INTRAMUSCULAR | Status: DC
  Administered 2014-07-04 – 2014-07-05 (×3): 20 mg via INTRAVENOUS
  Filled 2014-07-04 (×3): qty 2

## 2014-07-04 MED ORDER — LABETALOL HCL 300 MG PO TABS
300.0000 mg | ORAL_TABLET | Freq: Two times a day (BID) | ORAL | Status: DC
  Administered 2014-07-04 – 2014-07-08 (×9): 300 mg via ORAL
  Filled 2014-07-04 (×12): qty 1

## 2014-07-04 MED ORDER — FUROSEMIDE 10 MG/ML IJ SOLN
INTRAMUSCULAR | Status: AC
  Administered 2014-07-04: 20 mg via INTRAVENOUS
  Filled 2014-07-04: qty 4

## 2014-07-04 MED ORDER — HYDRALAZINE HCL 20 MG/ML IJ SOLN
10.0000 mg | Freq: Once | INTRAMUSCULAR | Status: AC
  Administered 2014-07-04: 10 mg via INTRAVENOUS

## 2014-07-04 MED ORDER — MORPHINE SULFATE 2 MG/ML IJ SOLN
1.0000 mg | INTRAMUSCULAR | Status: DC | PRN
  Administered 2014-07-04: 1 mg via INTRAVENOUS
  Administered 2014-07-04 (×2): 2 mg via INTRAVENOUS
  Administered 2014-07-05: 1 mg via INTRAVENOUS
  Filled 2014-07-04 (×3): qty 1

## 2014-07-04 NOTE — Progress Notes (Signed)
Gershon CraneWayne Gold PA-C at this patients bedside.  [name],Tevion Laforge 07/04/2014

## 2014-07-04 NOTE — Significant Event (Signed)
[facility] received [facility] at 11:25am.  In patient's room completing admission assessment.  Patient's daughters, son, and boyfriend at bedside.  Patient starts complaining of chest pain, sharp in nature, located at bilateral rib areas, increasing in intensity, rated 10/10.  Patient noticeably having mild shortness of breath.  Patient remains on 3 liters nasal cannula.  Rapid response nurse called and informed of situation, charge nurse Asher MuirJamie also informed of current situation.  Patient's blood pressure checked and is elevated, systolic above 140, heart rate less than 70.  Dr. Tyson AliasFeinstein paged and informed of patients current symptoms, blood pressure, and heart rate.  Verbal order received for Hydralazine 10mg  and Morphine 1-2mg  every four hours PRN.  [facility] received [facility] with infiltrated peripheral IV and swollen right wrist where IV is located.  Infiltrated peripheral IV removed, new peripheral IV inserted per Asher MuirJamie RN, Hydralazine and Morphine administered in new peripheral line.  After ten minutes, patient is experiencing chest pain relief, slight decrease in blood pressure.  Family remains at bedside, will continue to monitor.  [name],Charlotte Trim 07/04/2014

## 2014-07-04 NOTE — Progress Notes (Signed)
CT angio of test order placed per [facility], orders to be carried out, patient in bed, verbalizes chest pain relief.  Patient daughters at bedside, son, and boyfriend.  Patient is heard shouting at her daughters to leave her room, patient daughters and son all leave patients room, patients blood pressure checked, systolic above 140.  Heart rate in 60's, rhythm reading sinus on heart monitor.  Patient encouraged to rest and to calm herself so that her blood pressure may decrease.  Patient agreeable.  Will continue to monitor patient's blood pressure, heart rate, and heart rhythm.  [name],Genoa Freyre 07/04/2014

## 2014-07-04 NOTE — Progress Notes (Signed)
Education handouts explained and administered to patient regarding thoracentesis and pleural effusion.  Patient verbalized understanding upon explanation.  Will continue to monitor.  [name],Jasmynn Pfalzgraf 07/04/2014

## 2014-07-04 NOTE — Progress Notes (Signed)
UR completed.  Chavela Justiniano, RN BSN M[facility] CCM Trauma/Neuro ICU Case Manager [phone]  

## 2014-07-04 NOTE — Progress Notes (Signed)
Patient arrived to unit per bed accompanied by nurse Luther ParodySidney RN of [facility].  Patient alert, oriented, verbally responsive, breathing slightly labored with complaints of chest pain.  Patient and boyfriend oriented to unit and room.  Needs attended to.  Blood pressure elevated upon arrival.  Will continue to monitor chest pain and blood pressure.  [name],Endora Teresi 07/04/2014

## 2014-07-04 NOTE — Progress Notes (Signed)
INITIAL NUTRITION ASSESSMENT  DOCUMENTATION CODES Per approved criteria  -Not Applicable   INTERVENTION: Ensure Complete po TID, each supplement provides 350 kcal and 13 grams of protein  NUTRITION DIAGNOSIS: Inadequate oral intake related to decreased appetite, chest pain as evidenced by meal completion <60%.   Goal: Pt to meet >/= 90% of their estimated nutrition needs   Monitor:  PO intake, supplement acceptance, weight trend, labs  Reason for Assessment: RN consult  48 y.o. female  Admitting Dx: Aortic dissection distal to left subclavian  ASSESSMENT: Pt with continuing paralysis, per MD improving. Pt lives alone PTA and plans for a rehab stay prior to d/c home.  Per pt she cooks for herself at home. Breakfast: cereal; Lunch: sometimes skips, Dinner: sandwich during summer months, hot meal other times.  Pt does not feel she has lost any weight recently. Pt is not drinking any nutrition supplements at home but is willing to drink ensure.   No fat or muscle depletion noted during Nutrition-focused physical exam.   Height: Ht Readings from Last 1 Encounters:  06/29/14 5\' 5"  (1.651 m)    Weight: Wt Readings from Last 1 Encounters:  07/04/14 159 lb 2.8 oz (72.2 kg)    Ideal Body Weight: 56.8 kg   % Ideal Body Weight: 127%  Wt Readings from Last 10 Encounters:  07/04/14 159 lb 2.8 oz (72.2 kg)  02/09/11 131 lb 8 oz (59.648 kg)    Usual Body Weight: unknown  % Usual Body Weight: -  BMI:  Body mass index is 26.49 kg/(m^2).  Estimated Nutritional Needs: Kcal: 1700-1900 Protein: 75-85 grams Fluid: > 1.7 L/day  Skin: intact  Diet Order: Parke SimmersBland Meal Completion: 50-60% per documentation   EDUCATION NEEDS: -No education needs identified at this time   Intake/Output Summary (Last 24 hours) at 07/04/14 1033 Last data filed at 07/04/14 1030  Gross per 24 hour  Intake 2016.39 ml  Output   1250 ml  Net 766.39 ml    Last BM: PTA   Labs:   Recent  Labs Lab 06/29/14 1653 06/30/14 0303  07/02/14 0305 07/03/14 0228 07/04/14 0358  NA 146 145  < > 138 137 139  K 2.7* 3.7  < > 3.9 4.3 4.8  CL 107 104  < > 105 107 108  CO2  --  21  < > 20 19 20   BUN 13 18  < > 22 24* 21  CREATININE 1.50* 1.67*  < > 1.49* 1.52* 1.33*  CALCIUM  --  9.5  < > 8.6 8.4 8.6  MG  --  1.8  --   --   --   --   PHOS  --  3.7  --   --   --   --   GLUCOSE 154* 156*  < > 135* 113* 107*  < > = values in this interval not displayed.  CBG (last 3)   Recent Labs  07/03/14 2355 07/04/14 0345 07/04/14 0759  GLUCAP 135* 103* 102*    Scheduled Meds: . antiseptic oral rinse  7 mL Mouth Rinse BID  . docusate sodium  100 mg Oral Daily  . feeding supplement (ENSURE COMPLETE)  237 mL Oral TID BM  . furosemide  20 mg Intravenous Q12H  . labetalol  300 mg Oral BID  . pneumococcal 23 valent vaccine  0.5 mL Intramuscular Tomorrow-1000  . potassium chloride  40 mEq Oral BID    Continuous Infusions:   Past Medical History  Diagnosis Date  .  Hypertension   . Renal disorder     Past Surgical History  Procedure Laterality Date  . Cesarean section      Kendell Bane RD, LDN, CNSC [phone] Pager [phone] After Hours Pager

## 2014-07-04 NOTE — Progress Notes (Addendum)
[address]       [place] [id number]             [phone]          Subjective: C/o more chest pain, worse than it has been  Objective: Vital signs in last 24 hours: Temp:  [97.4 F (36.3 C)-98.5 F (36.9 C)] 98.3 F (36.8 C) (08/13 1115) Pulse Rate:  [58-79] 59 (08/13 1151) Cardiac Rhythm:  [-] Normal sinus rhythm (08/13 0800) Resp:  [13-31] 20 (08/13 1115) BP: (102-151)/(66-104) 151/82 mmHg (08/13 1151) SpO2:  [88 %-100 %] 95 % (08/13 1115) Weight:  [159 lb 2.8 oz (72.2 kg)] 159 lb 2.8 oz (72.2 kg) (08/13 0421)  Hemodynamic parameters for last 24 hours:    Intake/Output from previous day: 08/12 0701 - 08/13 0700 In: 1924.4 [P.O.:800; I.V.:1124.4] Out: 1050 [Urine:1050] Intake/Output this shift: Total I/O In: 509.5 [P.O.:477; I.V.:32.5] Out: 200 [Urine:200]  General appearance: alert, cooperative, distracted and mild distress Heart: regular rate and rhythm Lungs: clear to auscultation bilaterally Abdomen: soft, nontender Extremities: strength Right leg> left  Lab Results:  Recent Labs  07/03/14 0228 07/04/14 0358  WBC 8.3 7.3  HGB 10.8* 10.3*  HCT 32.8* 31.5*  PLT 170 175   BMET:  Recent Labs  07/03/14 0228 07/04/14 0358  NA 137 139  K 4.3 4.8  CL 107 108  CO2 19 20  GLUCOSE 113* 107*  BUN 24* 21  CREATININE 1.52* 1.33*  CALCIUM 8.4 8.6    PT/INR: No results found for this basename: LABPROT, INR,  in the last 72 hours ABG    Component Value Date/Time   TCO2 22 06/29/2014 1653   CBG (last 3)   Recent Labs  07/03/14 2355 07/04/14 0345 07/04/14 0759  GLUCAP 135* 103* 102*   Scheduled Meds: . antiseptic oral rinse  7 mL Mouth Rinse BID  . docusate sodium  100 mg Oral Daily  . feeding supplement (ENSURE COMPLETE)  237 mL Oral TID BM  . furosemide  20 mg Intravenous Q12H  . labetalol  300 mg Oral BID  . pneumococcal 23 valent vaccine  0.5 mL Intramuscular Tomorrow-1000  . potassium chloride  40 mEq Oral BID     Continuous Infusions:  PRN Meds:.sodium chloride, acetaminophen, albuterol, alum & mag hydroxide-simeth, ondansetron  Dg Chest Port 1 View  07/04/2014   CLINICAL DATA:  Reassess atelectasis  EXAM: PORTABLE CHEST - 1 VIEW  COMPARISON:  . Chest CT scan of June 29, 2014 portable chest x-ray of the same date  FINDINGS: The lungs are less well inflated today. The interstitial markings are mildly increased especially on the left. The retrocardiac region is dense consistent with atelectasis or pneumonia. The left hemidiaphragm is now obscured. The cardiac silhouette is mildly enlarged. The aortic knob is indistinct consistent with the known type B aortic dissection. The pulmonary vascularity is mildly engorged.  IMPRESSION: 1. There is left lower lobe atelectasis and/or pneumonia. 2. Increased pulmonary interstitial markings and mild cardiomegaly are consistent with CHF. 3. Indistinct aortic knob is consistent with the known aortic dissection.   Electronically Signed   By: David  Swazil[place]   On: 07/04/2014 08:05   Assessment/Plan:  1 hypertension is worse with current BP 161/118- Dr Tyson AliasFeinstein has ordered hydralazine and morphine 2 will repeat chest/abd/pelvis CTA   LOS: 5 days    [name],Charlotte E 07/04/2014  Today patient has improved strength in right lower leg , can now lift off bed  Pain meds all d/c on transfer, patient had increased chest pain early Repeat ct scan done , films reviewed with Dr Myra Gianotti- aorta appears stable has bilateral effusion left>right  Will have IR do US guided thoracentesis on  left  Tonight patient is comfortable  I have seen and examined Charlotte [name] and agree with the above assessment  and plan.  Delight Ovens MD Beeper [phone] Office [phone] 07/04/2014 6:00 PM

## 2014-07-04 NOTE — Progress Notes (Signed)
PULMONARY / CRITICAL CARE MEDICINE   Name: Charlotte [name] MRN: [id number] DOB: [birth year]    ADMISSION DATE:  06/29/2014  REFERRING MD : EDP  CHIEF COMPLAINT:  Inability to walk, chest pain  INITIAL PRESENTATION:  48 yo female smoker with weakness, dyspnea and chest pain.  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  STUDIES:  8/08 CT chest >> type B aortic dissection from Lt [place] artery 8/08 CT abd/pelvis >> dissection extends to aortic bifurcation 8/09 Echo >> EF 60-65, mild to mod LVH, mild diastolic dysfcn  SIGNIFICANT EVENTS: 8/08 Admit, TCTS/neurology consulted, spinal drain placed by anesthesia 8/11 >> spinal drain removed 8/12 remains on nicardipine, labetalol drip 8/13 - off drips  SUBJECTIVE:  Drips off  VITAL SIGNS: Temp:  [97.4 F (36.3 C)-98.5 F (36.9 C)] 97.4 F (36.3 C) (08/13 0730) Pulse Rate:  [61-79] 63 (08/13 0930) Resp:  [13-31] 18 (08/13 0930) BP: (102-140)/(66-104) 125/74 mmHg (08/13 0930) SpO2:  [88 %-100 %] 94 % (08/13 0930) Weight:  [72.2 kg (159 lb 2.8 oz)] 72.2 kg (159 lb 2.8 oz) (08/13 0421) INTAKE / OUTPUT:  Intake/Output Summary (Last 24 hours) at 07/04/14 0947 Last data filed at 07/04/14 0800  Gross per 24 hour  Intake 1604.39 ml  Output   1050 ml  Net 554.39 ml    PHYSICAL EXAMINATION: General: no distress Neuro: improved sensation in lower extremities, 4/5 strength uppers, lowers weak unchanged  Rt 2, left 1 HEENT: jvd wnl Cardiovascular: s1 s2 regular, no murmur Lungs: CTA Abdomen: soft, non tender, no r/g Musculoskeletal: no edema Skin: no rashes  LABS:  CBC  Recent Labs Lab 07/02/14 0305 07/03/14 0228 07/04/14 0358  WBC 8.5 8.3 7.3  HGB 12.2 10.8* 10.3*  HCT 37.7 32.8* 31.5*  PLT 173 170 175   Coag's  Recent Labs Lab 06/30/14 0255  APTT 34  INR 1.10   BMET  Recent Labs Lab 07/02/14 0305 07/03/14 0228 07/04/14 0358  NA 138 137 139  K  3.9 4.3 4.8  CL 105 107 108  CO2 20 19 20   BUN 22 24* 21  CREATININE 1.49* 1.52* 1.33*  GLUCOSE 135* 113* 107*   Electrolytes  Recent Labs Lab 06/30/14 0303  07/02/14 0305 07/03/14 0228 07/04/14 0358  CALCIUM 9.5  < > 8.6 8.4 8.6  MG 1.8  --   --   --   --   PHOS 3.7  --   --   --   --   < > = values in this interval not displayed. Sepsis Markers  Recent Labs Lab 06/30/14 0255 07/04/14 0358  LATICACIDVEN 4.7* 0.8   Liver Enzymes  Recent Labs Lab 06/30/14 0303  AST 18  ALT 16  ALKPHOS 93  BILITOT 0.2*  ALBUMIN 3.7   Glucose  Recent Labs Lab 07/03/14 1136 07/03/14 1616 07/03/14 2011 07/03/14 2355 07/04/14 0345 07/04/14 0759  GLUCAP 106* 116* 121* 135* 103* 102*    Imaging No results found.  ECHO TTE 8/9 - LVEF 60-65%, moderate concentric LVH, normal LA size, generous aortic arch without clear visualization of a dissection flap, normal descending aorta, diastolic dysfunction, normal LV fililng pressure, no pericardial effusion.   ASSESSMENT / PLAN:  PULMONARY A:  Hx of tobacco use. R/o edema, still requires O2 P:   Monitor sats, goal to reduce to RA lasix IS  CARDIOVASCULAR A:  Type B aortic dissection. Hx of HTN. Transient A  fib prior to admission. P:  Goal SBP < 120-140 as had made clinical progress Maintain labetolol oral 300 q12h, some brady, if needs further Bp meds oral, hydral  RENAL A:   AKI slow resolution Hypokalemia resolved Small NON AG pulm edema P:   Lasix Chem, in am  kvo  GASTROINTESTINAL A:   Nutrition. P:   soft diet, adv as tolerated  HEMATOLOGIC A:   dvt prevention P:  Limit phlebtomy SCD for DVT prevention  INFECTIOUS A:  No evidence for infection. P:   Monitor clinically  ENDOCRINE A:  Hyperglycemia. 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 Headache Bipolar  dz? P:   PT\OT active  Tylenol   To tele, triad  Mcarthur RossettiDaniel J. Tyson AliasFeinstein, MD, FACP Pgr: [phone] [facility]

## 2014-07-05 ENCOUNTER — Encounter (HOSPITAL_COMMUNITY): Payer: Self-pay | Admitting: General Practice

## 2014-07-05 ENCOUNTER — Inpatient Hospital Stay (HOSPITAL_COMMUNITY): Payer: Medicaid Other

## 2014-07-05 DIAGNOSIS — Q613 Polycystic kidney, unspecified: Secondary | ICD-10-CM

## 2014-07-05 HISTORY — PX: THORACENTESIS: SHX235

## 2014-07-05 LAB — BASIC METABOLIC PANEL
ANION GAP: 13 (ref 5–15)
BUN: 19 mg/dL (ref 6–23)
CALCIUM: 9.2 mg/dL (ref 8.4–10.5)
CO2: 24 mEq/L (ref 19–32)
Chloride: 101 mEq/L (ref 96–112)
Creatinine, Ser: 1.46 mg/dL — ABNORMAL HIGH (ref 0.50–1.10)
GFR calc Af Amer: 47 mL/min — ABNORMAL LOW (ref >90)
GFR, EST NON AFRICAN AMERICAN: 41 mL/min — AB (ref >90)
Glucose, Bld: 108 mg/dL — ABNORMAL HIGH (ref 70–99)
POTASSIUM: 4.3 meq/L (ref 3.7–5.3)
SODIUM: 138 meq/L (ref 137–147)

## 2014-07-05 LAB — CBC
HCT: 34.8 % — ABNORMAL LOW (ref 36.0–46.0)
HEMOGLOBIN: 11.8 g/dL — AB (ref 12.0–15.0)
MCH: 28.4 pg (ref 26.0–34.0)
MCHC: 33.9 g/dL (ref 30.0–36.0)
MCV: 83.7 fL (ref 78.0–100.0)
PLATELETS: 220 10*3/uL (ref 150–400)
RBC: 4.16 MIL/uL (ref 3.87–5.11)
RDW: 14.7 % (ref 11.5–15.5)
WBC: 7 10*3/uL (ref 4.0–10.5)

## 2014-07-05 LAB — MAGNESIUM: Magnesium: 2 mg/dL (ref 1.5–2.5)

## 2014-07-05 LAB — PHOSPHORUS: PHOSPHORUS: 4.3 mg/dL (ref 2.3–4.6)

## 2014-07-05 MED ORDER — HYDRALAZINE HCL 25 MG PO TABS
25.0000 mg | ORAL_TABLET | Freq: Three times a day (TID) | ORAL | Status: DC
  Filled 2014-07-05 (×3): qty 1

## 2014-07-05 MED ORDER — HYDRALAZINE HCL 20 MG/ML IJ SOLN
10.0000 mg | INTRAMUSCULAR | Status: DC | PRN
  Administered 2014-07-05: 10 mg via INTRAVENOUS
  Filled 2014-07-05: qty 1

## 2014-07-05 MED ORDER — HYDRALAZINE HCL 50 MG PO TABS
50.0000 mg | ORAL_TABLET | Freq: Three times a day (TID) | ORAL | Status: DC
  Administered 2014-07-05 – 2014-07-06 (×3): 50 mg via ORAL
  Filled 2014-07-05 (×6): qty 1

## 2014-07-05 MED ORDER — HYDROMORPHONE HCL PF 1 MG/ML IJ SOLN
1.0000 mg | INTRAMUSCULAR | Status: DC | PRN
Start: 2014-07-05 — End: 2014-07-08
  Administered 2014-07-05 – 2014-07-08 (×14): 1 mg via INTRAVENOUS
  Filled 2014-07-05 (×14): qty 1

## 2014-07-05 MED ORDER — TRAMADOL HCL 50 MG PO TABS
50.0000 mg | ORAL_TABLET | Freq: Four times a day (QID) | ORAL | Status: DC | PRN
  Administered 2014-07-05 – 2014-07-08 (×3): 50 mg via ORAL
  Filled 2014-07-05 (×3): qty 1

## 2014-07-05 MED ORDER — HYDRALAZINE HCL 20 MG/ML IJ SOLN
20.0000 mg | INTRAMUSCULAR | Status: DC | PRN
  Administered 2014-07-05 – 2014-07-07 (×3): 20 mg via INTRAVENOUS
  Filled 2014-07-05 (×3): qty 1

## 2014-07-05 MED ORDER — LIDOCAINE HCL (PF) 1 % IJ SOLN
INTRAMUSCULAR | Status: AC
  Filled 2014-07-05: qty 10

## 2014-07-05 MED ORDER — HYDROMORPHONE HCL PF 1 MG/ML IJ SOLN
0.5000 mg | INTRAMUSCULAR | Status: DC | PRN
  Administered 2014-07-05 (×2): 0.5 mg via INTRAVENOUS
  Filled 2014-07-05 (×2): qty 1

## 2014-07-05 NOTE — Progress Notes (Signed)
PROGRESS NOTE  [name] ZOX:[id number] DOB: 1966/01/24 DOA: 06/29/2014 PCP: No PCP Per Patient  48 yo female smoker with weakness, dyspnea and chest pain. 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, now with some paraplegia -PT recs CIR  pulm effusion -s/p thoracentesis 8/14  AKI with h/o polycytic kidney disease Cr trending down  HTN- goal of SBp < 130 -labetolol - hydralazine increase dose  H/o tobacco abuse   Code Status:full Family Communication: patient Disposition Plan:    Consultants:  Vascular  PCCM  Procedures:  Thoracentesis  Spinal drain    HPI/Subjective:  c/o chest pain- hurts with palpation  Objective: Filed Vitals:   07/05/14 0953  BP: 145/83  Pulse:   Temp:   Resp:     Intake/Output Summary (Last 24 hours) at 07/05/14 1028 Last data filed at 07/05/14 0600  Gross per 24 hour  Intake    237 ml  Output   2700 ml  Net  -2463 ml   Filed Weights   07/03/14 0500 07/04/14 0421 07/05/14 0427  Weight: 72.7 kg (160 lb 4.4 oz) 72.2 kg (159 lb 2.8 oz) 65.7 kg (144 lb 13.5 oz)    Exam:   General:  A+Ox3, NAD  Cardiovascular: rrr  Respiratory: clear anterior  Abdomen: +BS, soft  Musculoskeletal: LE weaker  Data Reviewed: Basic Metabolic Panel:  Recent Labs Lab 06/29/14 1653 06/30/14 0303 07/01/14 0350 07/02/14 0305 07/03/14 0228 07/04/14 0358 07/05/14 0834  NA 146 145 144 138 137 139 138  K 2.7* 3.7 3.5* 3.9 4.3 4.8 4.3  CL 107 104 108 105 107 108 101  CO2  --  21 21 20 19 20 24   GLUCOSE 154* 156* 102* 135* 113* 107* 108*  BUN 13 18 23 22  24* 21 19  CREATININE 1.50* 1.67* 1.60* 1.49* 1.52* 1.33* 1.46*  CALCIUM  --  9.5 8.9 8.6 8.4 8.6 9.2  MG  --  1.8  --   --   --   --  2.0  PHOS  --  3.7  --   --   --   --  4.3   Liver Function Tests:  Recent Labs Lab  06/30/14 0303  AST 18  ALT 16  ALKPHOS 93  BILITOT 0.2*  PROT 7.7  ALBUMIN 3.7   No results found for this basename: LIPASE, AMYLASE,  in the last 168 hours No results found for this basename: AMMONIA,  in the last 168 hours CBC:  Recent Labs Lab 06/30/14 0255 07/01/14 0350 07/02/14 0305 07/03/14 0228 07/04/14 0358 07/05/14 0834  WBC 11.1* 8.7 8.5 8.3 7.3 7.0  NEUTROABS 9.8*  --   --   --  4.8  --   HGB 14.1 12.3 12.2 10.8* 10.3* 11.8*  HCT 42.7 38.7 37.7 32.8* 31.5* 34.8*  MCV 85.7 85.8 85.5 84.3 84.7 83.7  PLT 198 180 173 170 175 220   Cardiac Enzymes: No results found for this basename: CKTOTAL, CKMB, CKMBINDEX, TROPONINI,  in the last 168 hours BNP (last 3 results) No results found for this basename: PROBNP,  in the last 8760 hours CBG:  Recent Labs Lab 07/03/14 1616 07/03/14 2011 07/03/14 2355 07/04/14 0345 07/04/14 0759  GLUCAP 116* 121* 135* 103* 102*    Recent Results (from the past 240 hour(s))  MRSA PCR SCREENING     Status: None  Collection Time    06/29/14  9:47 PM      Result Value Ref Range Status   MRSA by PCR NEGATIVE  NEGATIVE Final   Comment:            The GeneXpert MRSA Assay (FDA     approved for NASAL specimens     only), is one component of a     comprehensive MRSA colonization     surveillance program. It is not     intended to diagnose MRSA     infection nor to guide or     monitor treatment for     MRSA infections.     Studies: Dg Chest 1 View  07/05/2014   CLINICAL DATA:  Post LEFT thoracentesis  EXAM: CHEST - 1 VIEW  COMPARISON:  07/04/2014  FINDINGS: Minimal enlargement of cardiac silhouette.  Mediastinal contours and pulmonary vascularity normal.  Elevation of LEFT diaphragm with mild bibasilar atelectasis.  Decreased LEFT pleural effusion since preceding exam.  No pneumothorax.  Upper lungs clear.  Bones unremarkable.  IMPRESSION: No pneumothorax following LEFT thoracentesis.  Bibasilar atelectasis with decreased LEFT  pleural effusion since previous exam.   Electronically Signed   By: Ulyses Southward M.D.   On: 07/05/2014 10:24   Dg Chest Port 1 View  07/04/2014   CLINICAL DATA:  Reassess atelectasis  EXAM: PORTABLE CHEST - 1 VIEW  COMPARISON:  . Chest CT scan of June 29, 2014 portable chest x-ray of the same date  FINDINGS: The lungs are less well inflated today. The interstitial markings are mildly increased especially on the left. The retrocardiac region is dense consistent with atelectasis or pneumonia. The left hemidiaphragm is now obscured. The cardiac silhouette is mildly enlarged. The aortic knob is indistinct consistent with the known type B aortic dissection. The pulmonary vascularity is mildly engorged.  IMPRESSION: 1. There is left lower lobe atelectasis and/or pneumonia. 2. Increased pulmonary interstitial markings and mild cardiomegaly are consistent with CHF. 3. Indistinct aortic knob is consistent with the known aortic dissection.   Electronically Signed   By: David  [place]   On: 07/04/2014 08:05   Ct Angio Chest Aortic Dissect W &/or W/o  07/04/2014   CLINICAL DATA:  Type 3 aortic dissection.  Worsening chest pain.  EXAM: CT ANGIOGRAPHY CHEST, ABDOMEN AND PELVIS  TECHNIQUE: Multidetector CT imaging through the chest, abdomen and pelvis was performed using the standard protocol during bolus administration of intravenous contrast. Multiplanar reconstructed images and MIPs were obtained and reviewed to evaluate the vascular anatomy.  CONTRAST:  OMNIPAQUE IOHEXOL 350 MG/ML SOLN  COMPARISON:  06/29/2014.  FINDINGS: CTA CHEST FINDINGS  Small intramural hematoma is identified on precontrast imaging. This was present on the prior exam although it is better visualized due to technique on today's study. The intramural hematoma extends from the aortic arch to the upper abdomen.  Intramural hematoma appears slightly decreased in thickness compared to the prior exam 06/29/2014. The intramural hematoma extends from  the arch into the upper abdomen. There is no visible dissection flap. The small area of proximal descending thoracic aortic contrast suspicious for penetrating atherosclerotic ulcer is no longer visible on today's study.  In the interval since the prior exam, moderate to large LEFT and small RIGHT pleural effusions have developed with atelectasis. The pleural effusions are low-attenuation. There is no hemopericardium or hemothorax in this patient with acute aortic syndrome. Heart appears mildly enlarged and increased in size compared to prior, compatible with volume overload.  The lungs show compressive atelectasis but no areas of consolidation. Coronary artery atherosclerosis is present. If office based assessment of coronary risk factors has not been performed, it is now recommended.  Review of the MIP images confirms the above findings.  CTA ABDOMEN AND PELVIS FINDINGS  Abdominal aortic atherosclerosis without aneurysm. No dissection flap is identified in the abdominal aorta. Bilateral iliofemoral mild atherosclerosis. No occlusion. Celiac axis, superior mesenteric artery and inferior mesenteric artery are patent.  Polycystic kidney disease is present with associated liver cysts. There is a large calculus in the LEFT inferior renal pole measuring 13 mm. This may be contained within a cyst or an a partially obstructed collecting system or calyceal diverticulum. Bowel appears within normal limits. Liver and spleen normal. Small amount of ascites, probably secondary to volume overload. Mild anasarca changes are present in the subcutaneous tissues.  Review of the MIP images confirms the above findings.  IMPRESSION: 1. Evolving descending thoracic aortic intramural hematoma. Mildly decreased intramural hematoma evident on noncontrast imaging. Penetrating atherosclerotic ulcer is no longer visualized. No dissection flap. 2. Development of moderate to large LEFT pleural effusion and small RIGHT pleural effusion. Both  effusions layer dependently and produce substantial compressive atelectasis. 3. Polycystic kidney disease with associated liver cysts.   Electronically Signed   By: Andreas Newport M.D.   On: 07/04/2014 16:08   Ct Angio Abd/pel W/ And/or W/o  07/04/2014   CLINICAL DATA:  Type 3 aortic dissection.  Worsening chest pain.  EXAM: CT ANGIOGRAPHY CHEST, ABDOMEN AND PELVIS  TECHNIQUE: Multidetector CT imaging through the chest, abdomen and pelvis was performed using the standard protocol during bolus administration of intravenous contrast. Multiplanar reconstructed images and MIPs were obtained and reviewed to evaluate the vascular anatomy.  CONTRAST:  OMNIPAQUE IOHEXOL 350 MG/ML SOLN  COMPARISON:  06/29/2014.  FINDINGS: CTA CHEST FINDINGS  Small intramural hematoma is identified on precontrast imaging. This was present on the prior exam although it is better visualized due to technique on today's study. The intramural hematoma extends from the aortic arch to the upper abdomen.  Intramural hematoma appears slightly decreased in thickness compared to the prior exam 06/29/2014. The intramural hematoma extends from the arch into the upper abdomen. There is no visible dissection flap. The small area of proximal descending thoracic aortic contrast suspicious for penetrating atherosclerotic ulcer is no longer visible on today's study.  In the interval since the prior exam, moderate to large LEFT and small RIGHT pleural effusions have developed with atelectasis. The pleural effusions are low-attenuation. There is no hemopericardium or hemothorax in this patient with acute aortic syndrome. Heart appears mildly enlarged and increased in size compared to prior, compatible with volume overload. The lungs show compressive atelectasis but no areas of consolidation. Coronary artery atherosclerosis is present. If office based assessment of coronary risk factors has not been performed, it is now recommended.  Review of the MIP  images confirms the above findings.  CTA ABDOMEN AND PELVIS FINDINGS  Abdominal aortic atherosclerosis without aneurysm. No dissection flap is identified in the abdominal aorta. Bilateral iliofemoral mild atherosclerosis. No occlusion. Celiac axis, superior mesenteric artery and inferior mesenteric artery are patent.  Polycystic kidney disease is present with associated liver cysts. There is a large calculus in the LEFT inferior renal pole measuring 13 mm. This may be contained within a cyst or an a partially obstructed collecting system or calyceal diverticulum. Bowel appears within normal limits. Liver and spleen normal. Small amount of ascites, probably secondary to  volume overload. Mild anasarca changes are present in the subcutaneous tissues.  Review of the MIP images confirms the above findings.  IMPRESSION: 1. Evolving descending thoracic aortic intramural hematoma. Mildly decreased intramural hematoma evident on noncontrast imaging. Penetrating atherosclerotic ulcer is no longer visualized. No dissection flap. 2. Development of moderate to large LEFT pleural effusion and small RIGHT pleural effusion. Both effusions layer dependently and produce substantial compressive atelectasis. 3. Polycystic kidney disease with associated liver cysts.   Electronically Signed   By: Andreas Newport M.D.   On: 07/04/2014 16:08    Scheduled Meds: . antiseptic oral rinse  7 mL Mouth Rinse BID  . docusate sodium  100 mg Oral Daily  . feeding supplement (ENSURE COMPLETE)  237 mL Oral TID BM  . furosemide  20 mg Intravenous Q12H  . labetalol  300 mg Oral BID  . lidocaine (PF)      . pneumococcal 23 valent vaccine  0.5 mL Intramuscular Tomorrow-1000  . 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 If 7PM-7AM, please contact night-coverage at [email or web address], password [facility] 07/05/2014, 10:28 AM  LOS: 6 days

## 2014-07-05 NOTE — Progress Notes (Signed)
PT Cancellation Note  Patient Details Name: Rowe RobertHelene J Wonnacott MRN: [id number] DOB: 1966/11/14   Cancelled Treatment:    Reason Eval/Treat Not Completed:  (Refused due to pain despite max encouragement).  Will return Monday.Thanks.   INGOLD,Ravon Mortellaro 07/05/2014, 3:17 PM [facility] In[facility],PT Acute Rehabilitation [phone] (pager)

## 2014-07-05 NOTE — Progress Notes (Signed)
Patient refused to have labs drawn this morning.Will reschedule for 0800 with hopes patient will change her mind.Also will notify MD on rounds in morning if patient continues to refuse.

## 2014-07-05 NOTE — Progress Notes (Signed)
PULMONARY / CRITICAL CARE MEDICINE   Name: Charlotte [name] MRN: [id number] DOB: 10-16-1966    ADMISSION DATE:  06/29/2014  REFERRING MD : EDP  CHIEF COMPLAINT:  Inability to walk, chest pain  INITIAL PRESENTATION:  48 yo female smoker with weakness, dyspnea and chest pain.  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  STUDIES:  8/08 CT chest >> type B aortic dissection from Lt [place] artery 8/08 CT abd/pelvis >> dissection extends to aortic bifurcation 8/09 Echo >> EF 60-65, mild to mod LVH, mild diastolic dysfcn 8/13 CT chest>>>Evolving descending thoracic aortic intramural hematoma. Mildly<BR>decreased intramural hematoma evident on noncontrast imaging.<BR>Penetrating atherosclerotic ulcer is no longer visualized. No<BR>dissection flap.<BR>2. Development of moderate to large LEFT pleural effusion and small<BR>RIGHT pleural effusion. Both effusions layer dependently and produce<BR>substantial compressive atelectasis.<BR>3. Polycystic kidney disease with associated liver cysts  SIGNIFICANT EVENTS: 8/08 Admit, TCTS/neurology consulted, spinal drain placed by anesthesia 8/11 >> spinal drain removed 8/12 remains on nicardipine, labetalol drip 8/13 - off drips  SUBJECTIVE:  Had episode HTN sys 150 and chest pain Thora done Was neg 2.3 liters  VITAL SIGNS: Temp:  [98.3 F (36.8 C)-98.9 F (37.2 C)] 98.3 F (36.8 C) (08/14 0427) Pulse Rate:  [64-78] 74 (08/14 0500) Resp:  [18] 18 (08/14 0427) BP: (129-174)/(75-95) 137/77 mmHg (08/14 1413) SpO2:  [91 %-98 %] 94 % (08/14 0427) Weight:  [65.7 kg (144 lb 13.5 oz)] 65.7 kg (144 lb 13.5 oz) (08/14 0427) INTAKE / OUTPUT:  Intake/Output Summary (Last 24 hours) at 07/05/14 1536 Last data filed at 07/05/14 1056  Gross per 24 hour  Intake      0 ml  Output   2600 ml  Net  -2600 ml    PHYSICAL EXAMINATION: General: no distress Neuro: change in neuro asessment,  strength HEENT: jvd wnl Cardiovascular: s1 s2 regular, no murmur Lungs: CTA, improve left base Abdomen: soft, non tender, no r/g Musculoskeletal: no edema Skin: no rashes  LABS:  CBC  Recent Labs Lab 07/03/14 0228 07/04/14 0358 07/05/14 0834  WBC 8.3 7.3 7.0  HGB 10.8* 10.3* 11.8*  HCT 32.8* 31.5* 34.8*  PLT 170 175 220   Coag's  Recent Labs Lab 06/30/14 0255  APTT 34  INR 1.10   BMET  Recent Labs Lab 07/03/14 0228 07/04/14 0358 07/05/14 0834  NA 137 139 138  K 4.3 4.8 4.3  CL 107 108 101  CO2 19 20 24   BUN 24* 21 19  CREATININE 1.52* 1.33* 1.46*  GLUCOSE 113* 107* 108*   Electrolytes  Recent Labs Lab 06/30/14 0303  07/03/14 0228 07/04/14 0358 07/05/14 0834  CALCIUM 9.5  < > 8.4 8.6 9.2  MG 1.8  --   --   --  2.0  PHOS 3.7  --   --   --  4.3  < > = values in this interval not displayed. Sepsis Markers  Recent Labs Lab 06/30/14 0255 07/04/14 0358  LATICACIDVEN 4.7* 0.8   Liver Enzymes  Recent Labs Lab 06/30/14 0303  AST 18  ALT 16  ALKPHOS 93  BILITOT 0.2*  ALBUMIN 3.7   Glucose  Recent Labs Lab 07/03/14 1136 07/03/14 1616 07/03/14 2011 07/03/14 2355 07/04/14 0345 07/04/14 0759  GLUCAP 106* 116* 121* 135* 103* 102*    Imaging Dg Chest Port 1 View  07/04/2014   CLINICAL DATA:  Reassess atelectasis  EXAM: PORTABLE CHEST - 1 VIEW  COMPARISON:  .  Chest CT scan of June 29, 2014 portable chest x-ray of the same date  FINDINGS: The lungs are less well inflated today. The interstitial markings are mildly increased especially on the left. The retrocardiac region is dense consistent with atelectasis or pneumonia. The left hemidiaphragm is now obscured. The cardiac silhouette is mildly enlarged. The aortic knob is indistinct consistent with the known type B aortic dissection. The pulmonary vascularity is mildly engorged.  IMPRESSION: 1. There is left lower lobe atelectasis and/or pneumonia. 2. Increased pulmonary interstitial markings  and mild cardiomegaly are consistent with CHF. 3. Indistinct aortic knob is consistent with the known aortic dissection.   Electronically Signed   By: David  [place]   On: 07/04/2014 08:05   Ct Angio Chest Aortic Dissect W &/or W/o  07/04/2014   CLINICAL DATA:  Type 3 aortic dissection.  Worsening chest pain.  EXAM: CT ANGIOGRAPHY CHEST, ABDOMEN AND PELVIS  TECHNIQUE: Multidetector CT imaging through the chest, abdomen and pelvis was performed using the standard protocol during bolus administration of intravenous contrast. Multiplanar reconstructed images and MIPs were obtained and reviewed to evaluate the vascular anatomy.  CONTRAST:  OMNIPAQUE IOHEXOL 350 MG/ML SOLN  COMPARISON:  06/29/2014.  FINDINGS: CTA CHEST FINDINGS  Small intramural hematoma is identified on precontrast imaging. This was present on the prior exam although it is better visualized due to technique on today's study. The intramural hematoma extends from the aortic arch to the upper abdomen.  Intramural hematoma appears slightly decreased in thickness compared to the prior exam 06/29/2014. The intramural hematoma extends from the arch into the upper abdomen. There is no visible dissection flap. The small area of proximal descending thoracic aortic contrast suspicious for penetrating atherosclerotic ulcer is no longer visible on today's study.  In the interval since the prior exam, moderate to large LEFT and small RIGHT pleural effusions have developed with atelectasis. The pleural effusions are low-attenuation. There is no hemopericardium or hemothorax in this patient with acute aortic syndrome. Heart appears mildly enlarged and increased in size compared to prior, compatible with volume overload. The lungs show compressive atelectasis but no areas of consolidation. Coronary artery atherosclerosis is present. If office based assessment of coronary risk factors has not been performed, it is now recommended.  Review of the MIP images  confirms the above findings.  CTA ABDOMEN AND PELVIS FINDINGS  Abdominal aortic atherosclerosis without aneurysm. No dissection flap is identified in the abdominal aorta. Bilateral iliofemoral mild atherosclerosis. No occlusion. Celiac axis, superior mesenteric artery and inferior mesenteric artery are patent.  Polycystic kidney disease is present with associated liver cysts. There is a large calculus in the LEFT inferior renal pole measuring 13 mm. This may be contained within a cyst or an a partially obstructed collecting system or calyceal diverticulum. Bowel appears within normal limits. Liver and spleen normal. Small amount of ascites, probably secondary to volume overload. Mild anasarca changes are present in the subcutaneous tissues.  Review of the MIP images confirms the above findings.  IMPRESSION: 1. Evolving descending thoracic aortic intramural hematoma. Mildly decreased intramural hematoma evident on noncontrast imaging. Penetrating atherosclerotic ulcer is no longer visualized. No dissection flap. 2. Development of moderate to large LEFT pleural effusion and small RIGHT pleural effusion. Both effusions layer dependently and produce substantial compressive atelectasis. 3. Polycystic kidney disease with associated liver cysts.   Electronically Signed   By: Andreas Newport M.D.   On: 07/04/2014 16:08   Ct Angio Abd/pel W/ And/or W/o  07/04/2014  CLINICAL DATA:  Type 3 aortic dissection.  Worsening chest pain.  EXAM: CT ANGIOGRAPHY CHEST, ABDOMEN AND PELVIS  TECHNIQUE: Multidetector CT imaging through the chest, abdomen and pelvis was performed using the standard protocol during bolus administration of intravenous contrast. Multiplanar reconstructed images and MIPs were obtained and reviewed to evaluate the vascular anatomy.  CONTRAST:  OMNIPAQUE IOHEXOL 350 MG/ML SOLN  COMPARISON:  06/29/2014.  FINDINGS: CTA CHEST FINDINGS  Small intramural hematoma is identified on precontrast imaging. This was  present on the prior exam although it is better visualized due to technique on today's study. The intramural hematoma extends from the aortic arch to the upper abdomen.  Intramural hematoma appears slightly decreased in thickness compared to the prior exam 06/29/2014. The intramural hematoma extends from the arch into the upper abdomen. There is no visible dissection flap. The small area of proximal descending thoracic aortic contrast suspicious for penetrating atherosclerotic ulcer is no longer visible on today's study.  In the interval since the prior exam, moderate to large LEFT and small RIGHT pleural effusions have developed with atelectasis. The pleural effusions are low-attenuation. There is no hemopericardium or hemothorax in this patient with acute aortic syndrome. Heart appears mildly enlarged and increased in size compared to prior, compatible with volume overload. The lungs show compressive atelectasis but no areas of consolidation. Coronary artery atherosclerosis is present. If office based assessment of coronary risk factors has not been performed, it is now recommended.  Review of the MIP images confirms the above findings.  CTA ABDOMEN AND PELVIS FINDINGS  Abdominal aortic atherosclerosis without aneurysm. No dissection flap is identified in the abdominal aorta. Bilateral iliofemoral mild atherosclerosis. No occlusion. Celiac axis, superior mesenteric artery and inferior mesenteric artery are patent.  Polycystic kidney disease is present with associated liver cysts. There is a large calculus in the LEFT inferior renal pole measuring 13 mm. This may be contained within a cyst or an a partially obstructed collecting system or calyceal diverticulum. Bowel appears within normal limits. Liver and spleen normal. Small amount of ascites, probably secondary to volume overload. Mild anasarca changes are present in the subcutaneous tissues.  Review of the MIP images confirms the above findings.  IMPRESSION: 1.  Evolving descending thoracic aortic intramural hematoma. Mildly decreased intramural hematoma evident on noncontrast imaging. Penetrating atherosclerotic ulcer is no longer visualized. No dissection flap. 2. Development of moderate to large LEFT pleural effusion and small RIGHT pleural effusion. Both effusions layer dependently and produce substantial compressive atelectasis. 3. Polycystic kidney disease with associated liver cysts.   Electronically Signed   By: Andreas Newport M.D.   On: 07/04/2014 16:08    ECHO TTE 8/9 - LVEF 60-65%, moderate concentric LVH, normal LA size, generous aortic arch without clear visualization of a dissection flap, normal descending aorta, diastolic dysfunction, normal LV fililng pressure, no pericardial effusion.   ASSESSMENT / PLAN:  PULMONARY A:  Hx of tobacco use. R/o edema, still requires O2 S/p thora 300 cc bloody left chest P:   Monitor sats, goal to reduce to RA Lasix to neg balance 1 liter goal, reduce or hold lasix also with s/p contrast  CARDIOVASCULAR A:  Type B aortic dissection. Hx of HTN. HTN with chest pain at sys 150 Transient A fib prior to admission. 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 Headache Bipolar dz P:   PT\OT attempts made Tylenol Doing well off home RX antipsychotics, she described  tremors with , so would avoid upon dc   Will sign off, call if needed  Mcarthur RossettiDaniel J. Tyson AliasFeinstein, MD, FACP Pgr: [phone] [facility]

## 2014-07-05 NOTE — Progress Notes (Signed)
OT Cancellation Note  Patient Details Name: Rowe RobertHelene J Chery MRN: [id number] DOB: 01-14-1966   Cancelled Treatment:    Reason Eval/Treat Not Completed: Pain limiting ability to participate - Pt refused OT citing pain in her chest.  Attempted multiple times to encourage her to at least move to EOB with no avail.  Will try back next week.   Angelene GiovanniConarpe, Allena Pietila M Yulitza Shorts Hayden Lakeonarpe, OTR/L [phone] 07/05/2014, 3:17 PM

## 2014-07-05 NOTE — Progress Notes (Addendum)
[address]       [place] [address]             [phone]          Subjective: Pain currently controlled  Objective: Vital signs in last 24 hours: Temp:  [98.3 F (36.8 C)-98.9 F (37.2 C)] 98.3 F (36.8 C) (08/14 0427) Pulse Rate:  [58-78] 74 (08/14 0500) Cardiac Rhythm:  [-] Normal sinus rhythm (08/13 2041) Resp:  [16-27] 18 (08/14 0427) BP: (115-174)/(67-109) 146/78 mmHg (08/14 0600) SpO2:  [91 %-98 %] 94 % (08/14 0427) Weight:  [144 lb 13.5 oz (65.7 kg)] 144 lb 13.5 oz (65.7 kg) (08/14 0427)  Hemodynamic parameters for last 24 hours:    Intake/Output from previous day: 08/13 0701 - 08/14 0700 In: 509.5 [P.O.:477; I.V.:32.5] Out: 2900 [Urine:2900] Intake/Output this shift:    General appearance: alert, cooperative and no distress Heart: regular rate and rhythm Lungs: dim left>right base Abdomen: benign Extremities: pas in place Neuro - no change in LE R>L strength Lab Results:  Recent Labs  07/03/14 0228 07/04/14 0358  WBC 8.3 7.3  HGB 10.8* 10.3*  HCT 32.8* 31.5*  PLT 170 175   BMET:  Recent Labs  07/03/14 0228 07/04/14 0358  NA 137 139  K 4.3 4.8  CL 107 108  CO2 19 20  GLUCOSE 113* 107*  BUN 24* 21  CREATININE 1.52* 1.33*  CALCIUM 8.4 8.6    PT/INR: No results found for this basename: LABPROT, INR,  in the last 72 hours ABG    Component Value Date/Time   TCO2 22 06/29/2014 1653   CBG (last 3)   Recent Labs  07/03/14 2355 07/04/14 0345 07/04/14 0759  GLUCAP 135* 103* 102*   Scheduled Meds: . antiseptic oral rinse  7 mL Mouth Rinse BID  . docusate sodium  100 mg Oral Daily  . feeding supplement (ENSURE COMPLETE)  237 mL Oral TID BM  . furosemide  20 mg Intravenous Q12H  . labetalol  300 mg Oral BID  . pneumococcal 23 valent vaccine  0.5 mL Intramuscular Tomorrow-1000  . potassium chloride  40 mEq Oral BID   Continuous Infusions:  PRN Meds:.sodium chloride, acetaminophen, albuterol, alum & mag  hydroxide-simeth, HYDROmorphone (DILAUDID) injection, ondansetron  Dg Chest Port 1 View  07/04/2014   CLINICAL DATA:  Reassess atelectasis  EXAM: PORTABLE CHEST - 1 VIEW  COMPARISON:  . Chest CT scan of June 29, 2014 portable chest x-ray of the same date  FINDINGS: The lungs are less well inflated today. The interstitial markings are mildly increased especially on the left. The retrocardiac region is dense consistent with atelectasis or pneumonia. The left hemidiaphragm is now obscured. The cardiac silhouette is mildly enlarged. The aortic knob is indistinct consistent with the known type B aortic dissection. The pulmonary vascularity is mildly engorged.  IMPRESSION: 1. There is left lower lobe atelectasis and/or pneumonia. 2. Increased pulmonary interstitial markings and mild cardiomegaly are consistent with CHF. 3. Indistinct aortic knob is consistent with the known aortic dissection.   Electronically Signed   By: David  [place]   On: 07/04/2014 08:05   Ct Angio Chest Aortic Dissect W &/or W/o  07/04/2014   CLINICAL DATA:  Type 3 aortic dissection.  Worsening chest pain.  EXAM: CT ANGIOGRAPHY CHEST, ABDOMEN AND PELVIS  TECHNIQUE: Multidetector CT imaging through the chest, abdomen and pelvis was performed using the standard protocol during bolus administration of intravenous contrast. Multiplanar reconstructed images  and MIPs were obtained and reviewed to evaluate the vascular anatomy.  CONTRAST:  100mL OMNIPAQUE IOHEXOL 350 MG/ML SOLN  COMPARISON:  06/29/2014.  FINDINGS: CTA CHEST FINDINGS  Small intramural hematoma is identified on precontrast imaging. This was present on the prior exam although it is better visualized due to technique on today's study. The intramural hematoma extends from the aortic arch to the upper abdomen.  Intramural hematoma appears slightly decreased in thickness compared to the prior exam 06/29/2014. The intramural hematoma extends from the arch into the upper abdomen. There is no  visible dissection flap. The small area of proximal descending thoracic aortic contrast suspicious for penetrating atherosclerotic ulcer is no longer visible on today's study.  In the interval since the prior exam, moderate to large LEFT and small RIGHT pleural effusions have developed with atelectasis. The pleural effusions are low-attenuation. There is no hemopericardium or hemothorax in this patient with acute aortic syndrome. Heart appears mildly enlarged and increased in size compared to prior, compatible with volume overload. The lungs show compressive atelectasis but no areas of consolidation. Coronary artery atherosclerosis is present. If office based assessment of coronary risk factors has not been performed, it is now recommended.  Review of the MIP images confirms the above findings.  CTA ABDOMEN AND PELVIS FINDINGS  Abdominal aortic atherosclerosis without aneurysm. No dissection flap is identified in the abdominal aorta. Bilateral iliofemoral mild atherosclerosis. No occlusion. Celiac axis, superior mesenteric artery and inferior mesenteric artery are patent.  Polycystic kidney disease is present with associated liver cysts. There is a large calculus in the LEFT inferior renal pole measuring 13 mm. This may be contained within a cyst or an a partially obstructed collecting system or calyceal diverticulum. Bowel appears within normal limits. Liver and spleen normal. Small amount of ascites, probably secondary to volume overload. Mild anasarca changes are present in the subcutaneous tissues.  Review of the MIP images confirms the above findings.  IMPRESSION: 1. Evolving descending thoracic aortic intramural hematoma. Mildly decreased intramural hematoma evident on noncontrast imaging. Penetrating atherosclerotic ulcer is no longer visualized. No dissection flap. 2. Development of moderate to large LEFT pleural effusion and small RIGHT pleural effusion. Both effusions layer dependently and produce  substantial compressive atelectasis. 3. Polycystic kidney disease with associated liver cysts.   Electronically Signed   By: Andreas NewportGeoffrey  Lamke M.D.   On: 07/04/2014 16:08   Ct Angio Abd/pel W/ And/or W/o  07/04/2014   CLINICAL DATA:  Type 3 aortic dissection.  Worsening chest pain.  EXAM: CT ANGIOGRAPHY CHEST, ABDOMEN AND PELVIS  TECHNIQUE: Multidetector CT imaging through the chest, abdomen and pelvis was performed using the standard protocol during bolus administration of intravenous contrast. Multiplanar reconstructed images and MIPs were obtained and reviewed to evaluate the vascular anatomy.  CONTRAST:  100mL OMNIPAQUE IOHEXOL 350 MG/ML SOLN  COMPARISON:  06/29/2014.  FINDINGS: CTA CHEST FINDINGS  Small intramural hematoma is identified on precontrast imaging. This was present on the prior exam although it is better visualized due to technique on today's study. The intramural hematoma extends from the aortic arch to the upper abdomen.  Intramural hematoma appears slightly decreased in thickness compared to the prior exam 06/29/2014. The intramural hematoma extends from the arch into the upper abdomen. There is no visible dissection flap. The small area of proximal descending thoracic aortic contrast suspicious for penetrating atherosclerotic ulcer is no longer visible on today's study.  In the interval since the prior exam, moderate to large LEFT and small RIGHT pleural  effusions have developed with atelectasis. The pleural effusions are low-attenuation. There is no hemopericardium or hemothorax in this patient with acute aortic syndrome. Heart appears mildly enlarged and increased in size compared to prior, compatible with volume overload. The lungs show compressive atelectasis but no areas of consolidation. Coronary artery atherosclerosis is present. If office based assessment of coronary risk factors has not been performed, it is now recommended.  Review of the MIP images confirms the above findings.  CTA  ABDOMEN AND PELVIS FINDINGS  Abdominal aortic atherosclerosis without aneurysm. No dissection flap is identified in the abdominal aorta. Bilateral iliofemoral mild atherosclerosis. No occlusion. Celiac axis, superior mesenteric artery and inferior mesenteric artery are patent.  Polycystic kidney disease is present with associated liver cysts. There is a large calculus in the LEFT inferior renal pole measuring 13 mm. This may be contained within a cyst or an a partially obstructed collecting system or calyceal diverticulum. Bowel appears within normal limits. Liver and spleen normal. Small amount of ascites, probably secondary to volume overload. Mild anasarca changes are present in the subcutaneous tissues.  Review of the MIP images confirms the above findings.  IMPRESSION: 1. Evolving descending thoracic aortic intramural hematoma. Mildly decreased intramural hematoma evident on noncontrast imaging. Penetrating atherosclerotic ulcer is no longer visualized. No dissection flap. 2. Development of moderate to large LEFT pleural effusion and small RIGHT pleural effusion. Both effusions layer dependently and produce substantial compressive atelectasis. 3. Polycystic kidney disease with associated liver cysts.   Electronically Signed   By: Andreas Newport M.D.   On: 07/04/2014 16:08   A/P  1 she is more comfortable 2 BP is poorly controlled- did get one dose of iv hydralizine, will order prn, will need additional po agent to labetolol 3 for left thoracentesis today 4 additional care per primary svc    LOS: 6 days    GOLD,WAYNE E 07/05/2014  Thoracentesis on left today, breathing better Lower extremity about the same Right stronger then the left

## 2014-07-05 NOTE — Procedures (Signed)
   L thoracentesis US guided Pt tolerated well  300 cc bloody fluid  Sent for labs per MD

## 2014-07-06 ENCOUNTER — Inpatient Hospital Stay (HOSPITAL_COMMUNITY): Payer: Medicaid Other

## 2014-07-06 LAB — BASIC METABOLIC PANEL
Anion gap: 14 (ref 5–15)
BUN: 28 mg/dL — AB (ref 6–23)
CHLORIDE: 99 meq/L (ref 96–112)
CO2: 25 meq/L (ref 19–32)
Calcium: 9.4 mg/dL (ref 8.4–10.5)
Creatinine, Ser: 1.76 mg/dL — ABNORMAL HIGH (ref 0.50–1.10)
GFR calc Af Amer: 38 mL/min — ABNORMAL LOW (ref >90)
GFR calc non Af Amer: 33 mL/min — ABNORMAL LOW (ref >90)
Glucose, Bld: 123 mg/dL — ABNORMAL HIGH (ref 70–99)
POTASSIUM: 4.8 meq/L (ref 3.7–5.3)
SODIUM: 138 meq/L (ref 137–147)

## 2014-07-06 MED ORDER — HYDRALAZINE HCL 50 MG PO TABS
75.0000 mg | ORAL_TABLET | Freq: Three times a day (TID) | ORAL | Status: DC
  Administered 2014-07-06 – 2014-07-08 (×7): 75 mg via ORAL
  Filled 2014-07-06 (×11): qty 1

## 2014-07-06 MED ORDER — OXYCODONE HCL 5 MG PO TABS
5.0000 mg | ORAL_TABLET | Freq: Four times a day (QID) | ORAL | Status: DC | PRN
  Administered 2014-07-06: 5 mg via ORAL
  Administered 2014-07-07 – 2014-07-08 (×4): 10 mg via ORAL
  Filled 2014-07-06: qty 1
  Filled 2014-07-06 (×4): qty 2

## 2014-07-06 NOTE — Progress Notes (Signed)
PROGRESS NOTE  Rowe RobertHelene J Richner JYN:[id number]:[id number] DOB: 03/31/1966 DOA: 06/29/2014 PCP: No PCP Per Patient  48 yo female smoker with weakness, dyspnea and chest pain. 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, now with some paraplegia -PT recs CIR/SNF  pulm effusion -s/p thoracentesis 8/14 -chest x ray pending  AKI with h/o polycytic kidney disease -given dose of lasix -d/c lasix -trend -avoid nephrotoxic agents  HTN- goal of SBp < 130 -labetolol - hydralazine increase dose with better BP control  H/o tobacco abuse   Code Status:full Family Communication: patient Disposition Plan:    Consultants:  Vascular  PCCM  Procedures:  Thoracentesis 8/14  Spinal drain 8/8    HPI/Subjective: Chest pain better but not resolved  Objective: Filed Vitals:   07/06/14 0750  BP: 128/63  Pulse:   Temp:   Resp:     Intake/Output Summary (Last 24 hours) at 07/06/14 0906 Last data filed at 07/06/14 0700  Gross per 24 hour  Intake    120 ml  Output    600 ml  Net   -480 ml   Filed Weights   07/04/14 0421 07/05/14 0427 07/06/14 0402  Weight: 72.2 kg (159 lb 2.8 oz) 65.7 kg (144 lb 13.5 oz) 65.8 kg (145 lb 1 oz)    Exam:   General:  A+Ox3, NAD  Cardiovascular: rrr  Respiratory: clear anterior  Abdomen: +BS, soft  Musculoskeletal: LE weaker but moves all 4  Data Reviewed: Basic Metabolic Panel:  Recent Labs Lab 06/29/14 1653 06/30/14 0303  07/02/14 0305 07/03/14 0228 07/04/14 0358 07/05/14 0834 07/06/14 0116  NA 146 145  < > 138 137 139 138 138  K 2.7* 3.7  < > 3.9 4.3 4.8 4.3 4.8  CL 107 104  < > 105 107 108 101 99  CO2  --  21  < > 20 19 20 24 25   GLUCOSE 154* 156*  < > 135* 113* 107* 108* 123*  BUN 13 18  < >  22 24* 21 19 28*  CREATININE 1.50* 1.67*  < > 1.49* 1.52* 1.33* 1.46* 1.76*  CALCIUM  --  9.5  < > 8.6 8.4 8.6 9.2 9.4  MG  --  1.8  --   --   --   --  2.0  --   PHOS  --  3.7  --   --   --   --  4.3  --   < > = values in this interval not displayed. Liver Function Tests:  Recent Labs Lab 06/30/14 0303  AST 18  ALT 16  ALKPHOS 93  BILITOT 0.2*  PROT 7.7  ALBUMIN 3.7   No results found for this basename: LIPASE, AMYLASE,  in the last 168 hours No results found for this basename: AMMONIA,  in the last 168 hours CBC:  Recent Labs Lab 06/30/14 0255 07/01/14 0350 07/02/14 0305 07/03/14 0228 07/04/14 0358 07/05/14 0834  WBC 11.1* 8.7 8.5 8.3 7.3 7.0  NEUTROABS 9.8*  --   --   --  4.8  --   HGB 14.1 12.3 12.2 10.8* 10.3* 11.8*  HCT 42.7 38.7 37.7 32.8* 31.5* 34.8*  MCV 85.7 85.8 85.5 84.3 84.7 83.7  PLT 198 180 173 170 175 220  Cardiac Enzymes: No results found for this basename: CKTOTAL, CKMB, CKMBINDEX, TROPONINI,  in the last 168 hours BNP (last 3 results) No results found for this basename: PROBNP,  in the last 8760 hours CBG:  Recent Labs Lab 07/03/14 1616 07/03/14 2011 07/03/14 2355 07/04/14 0345 07/04/14 0759  GLUCAP 116* 121* 135* 103* 102*    Recent Results (from the past 240 hour(s))  MRSA PCR SCREENING     Status: None   Collection Time    06/29/14  9:47 PM      Result Value Ref Range Status   MRSA by PCR NEGATIVE  NEGATIVE Final   Comment:            The GeneXpert MRSA Assay (FDA     approved for NASAL specimens     only), is one component of a     comprehensive MRSA colonization     surveillance program. It is not     intended to diagnose MRSA     infection nor to guide or     monitor treatment for     MRSA infections.  BODY FLUID CULTURE     Status: None   Collection Time    07/05/14  9:48 AM      Result Value Ref Range Status   Specimen Description PLEURAL LEFT   Final   Special Requests LEFT PLEURAL EFFUSION   Final   Gram Stain      Final   Value: RARE WBC PRESENT, PREDOMINANTLY PMN     NO ORGANISMS SEEN     Performed at [facility]   Culture PENDING   Incomplete   Report Status PENDING   Incomplete     Studies: Dg Chest 1 View  07/05/2014   CLINICAL DATA:  Post LEFT thoracentesis  EXAM: CHEST - 1 VIEW  COMPARISON:  07/04/2014  FINDINGS: Minimal enlargement of cardiac silhouette.  Mediastinal contours and pulmonary vascularity normal.  Elevation of LEFT diaphragm with mild bibasilar atelectasis.  Decreased LEFT pleural effusion since preceding exam.  No pneumothorax.  Upper lungs clear.  Bones unremarkable.  IMPRESSION: No pneumothorax following LEFT thoracentesis.  Bibasilar atelectasis with decreased LEFT pleural effusion since previous exam.   Electronically Signed   By: Ulyses Southward M.D.   On: 07/05/2014 10:24   Ct Angio Chest Aortic Dissect W &/or W/o  07/04/2014   CLINICAL DATA:  Type 3 aortic dissection.  Worsening chest pain.  EXAM: CT ANGIOGRAPHY CHEST, ABDOMEN AND PELVIS  TECHNIQUE: Multidetector CT imaging through the chest, abdomen and pelvis was performed using the standard protocol during bolus administration of intravenous contrast. Multiplanar reconstructed images and MIPs were obtained and reviewed to evaluate the vascular anatomy.  CONTRAST:  OMNIPAQUE IOHEXOL 350 MG/ML SOLN  COMPARISON:  06/29/2014.  FINDINGS: CTA CHEST FINDINGS  Small intramural hematoma is identified on precontrast imaging. This was present on the prior exam although it is better visualized due to technique on today's study. The intramural hematoma extends from the aortic arch to the upper abdomen.  Intramural hematoma appears slightly decreased in thickness compared to the prior exam 06/29/2014. The intramural hematoma extends from the arch into the upper abdomen. There is no visible dissection flap. The small area of proximal descending thoracic aortic contrast suspicious for penetrating atherosclerotic ulcer is no longer  visible on today's study.  In the interval since the prior exam, moderate to large LEFT and small RIGHT pleural effusions have developed with atelectasis. The pleural effusions are low-attenuation. There  is no hemopericardium or hemothorax in this patient with acute aortic syndrome. Heart appears mildly enlarged and increased in size compared to prior, compatible with volume overload. The lungs show compressive atelectasis but no areas of consolidation. Coronary artery atherosclerosis is present. If office based assessment of coronary risk factors has not been performed, it is now recommended.  Review of the MIP images confirms the above findings.  CTA ABDOMEN AND PELVIS FINDINGS  Abdominal aortic atherosclerosis without aneurysm. No dissection flap is identified in the abdominal aorta. Bilateral iliofemoral mild atherosclerosis. No occlusion. Celiac axis, superior mesenteric artery and inferior mesenteric artery are patent.  Polycystic kidney disease is present with associated liver cysts. There is a large calculus in the LEFT inferior renal pole measuring 13 mm. This may be contained within a cyst or an a partially obstructed collecting system or calyceal diverticulum. Bowel appears within normal limits. Liver and spleen normal. Small amount of ascites, probably secondary to volume overload. Mild anasarca changes are present in the subcutaneous tissues.  Review of the MIP images confirms the above findings.  IMPRESSION: 1. Evolving descending thoracic aortic intramural hematoma. Mildly decreased intramural hematoma evident on noncontrast imaging. Penetrating atherosclerotic ulcer is no longer visualized. No dissection flap. 2. Development of moderate to large LEFT pleural effusion and small RIGHT pleural effusion. Both effusions layer dependently and produce substantial compressive atelectasis. 3. Polycystic kidney disease with associated liver cysts.   Electronically Signed   By: Andreas Newport M.D.   On:  07/04/2014 16:08   Ct Angio Abd/pel W/ And/or W/o  07/04/2014   CLINICAL DATA:  Type 3 aortic dissection.  Worsening chest pain.  EXAM: CT ANGIOGRAPHY CHEST, ABDOMEN AND PELVIS  TECHNIQUE: Multidetector CT imaging through the chest, abdomen and pelvis was performed using the standard protocol during bolus administration of intravenous contrast. Multiplanar reconstructed images and MIPs were obtained and reviewed to evaluate the vascular anatomy.  CONTRAST:  OMNIPAQUE IOHEXOL 350 MG/ML SOLN  COMPARISON:  06/29/2014.  FINDINGS: CTA CHEST FINDINGS  Small intramural hematoma is identified on precontrast imaging. This was present on the prior exam although it is better visualized due to technique on today's study. The intramural hematoma extends from the aortic arch to the upper abdomen.  Intramural hematoma appears slightly decreased in thickness compared to the prior exam 06/29/2014. The intramural hematoma extends from the arch into the upper abdomen. There is no visible dissection flap. The small area of proximal descending thoracic aortic contrast suspicious for penetrating atherosclerotic ulcer is no longer visible on today's study.  In the interval since the prior exam, moderate to large LEFT and small RIGHT pleural effusions have developed with atelectasis. The pleural effusions are low-attenuation. There is no hemopericardium or hemothorax in this patient with acute aortic syndrome. Heart appears mildly enlarged and increased in size compared to prior, compatible with volume overload. The lungs show compressive atelectasis but no areas of consolidation. Coronary artery atherosclerosis is present. If office based assessment of coronary risk factors has not been performed, it is now recommended.  Review of the MIP images confirms the above findings.  CTA ABDOMEN AND PELVIS FINDINGS  Abdominal aortic atherosclerosis without aneurysm. No dissection flap is identified in the abdominal aorta. Bilateral  iliofemoral mild atherosclerosis. No occlusion. Celiac axis, superior mesenteric artery and inferior mesenteric artery are patent.  Polycystic kidney disease is present with associated liver cysts. There is a large calculus in the LEFT inferior renal pole measuring 13 mm. This may be contained within a  cyst or an a partially obstructed collecting system or calyceal diverticulum. Bowel appears within normal limits. Liver and spleen normal. Small amount of ascites, probably secondary to volume overload. Mild anasarca changes are present in the subcutaneous tissues.  Review of the MIP images confirms the above findings.  IMPRESSION: 1. Evolving descending thoracic aortic intramural hematoma. Mildly decreased intramural hematoma evident on noncontrast imaging. Penetrating atherosclerotic ulcer is no longer visualized. No dissection flap. 2. Development of moderate to large LEFT pleural effusion and small RIGHT pleural effusion. Both effusions layer dependently and produce substantial compressive atelectasis. 3. Polycystic kidney disease with associated liver cysts.   Electronically Signed   By: Andreas Newport M.D.   On: 07/04/2014 16:08   US Thoracentesis Asp Pleural Space W/img Guide  07/05/2014   CLINICAL DATA:  Left pleural effusion  EXAM: ULTRASOUND GUIDED left THORACENTESIS  COMPARISON:  None.  PROCEDURE: An ultrasound guided thoracentesis was thoroughly discussed with the patient and questions answered. The benefits, risks, alternatives and complications were also discussed. The patient understands and wishes to proceed with the procedure. Written consent was obtained.  Ultrasound was performed to localize and mark an adequate pocket of fluid in the left chest. The area was then prepped and draped in the normal sterile fashion. 1% Lidocaine was used for local anesthesia. Under ultrasound guidance a 19 gauge Yueh catheter was introduced. Thoracentesis was performed. The catheter was removed and a dressing  applied.  Complications:  None.  FINDINGS: A total of approximately 300 cc of blood tinged fluid was removed. A fluid sample wassent for laboratory analysis.  IMPRESSION: Successful ultrasound guided left thoracentesis yielding 300 cc of pleural fluid.  Read by:  Robet Leu [facility]   Electronically Signed   By: Irish Lack M.D.   On: 07/05/2014 11:51    Scheduled Meds: . antiseptic oral rinse  7 mL Mouth Rinse BID  . docusate sodium  100 mg Oral Daily  . feeding supplement (ENSURE COMPLETE)  237 mL Oral TID BM  . hydrALAZINE  50 mg Oral 3 times per day  . labetalol  300 mg Oral BID  . pneumococcal 23 valent vaccine  0.5 mL Intramuscular Tomorrow-1000  . 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 If 7PM-7AM, please contact night-coverage at [email or web address], password [facility] 07/06/2014, 9:06 AM  LOS: 7 days

## 2014-07-06 NOTE — Progress Notes (Addendum)
      [address]       [place] [id number]             [phone]      Subjective:  [name] complains of pain along her left side this morning.  Otherwise she has no new complaints.   Objective: Vital signs in last 24 hours: Temp:  [98.4 F (36.9 C)-99 F (37.2 C)] 98.7 F (37.1 C) (08/15 0402) Pulse Rate:  [82-97] 92 (08/15 0402) Cardiac Rhythm:  [-] Normal sinus rhythm (08/14 2018) Resp:  [18] 18 (08/15 0402) BP: (119-167)/(65-95) 131/71 mmHg (08/15 0402) SpO2:  [91 %-97 %] 96 % (08/15 0402) Weight:  [145 lb 1 oz (65.8 kg)] 145 lb 1 oz (65.8 kg) (08/15 0402)  Intake/Output from previous day: 08/14 0701 - 08/15 0700 In: 120 [P.O.:120] Out: 300 [Urine:300]  General appearance: alert, cooperative and no distress Heart: regular rate and rhythm Lungs: diminished breath sounds bibasilar Extremities: extremities normal, atraumatic, no cyanosis or edema Wound: clean and dry  Lab Results:  Recent Labs  07/04/14 0358 07/05/14 0834  WBC 7.3 7.0  HGB 10.3* 11.8*  HCT 31.5* 34.8*  PLT 175 220   BMET:  Recent Labs  07/05/14 0834 07/06/14 0116  NA 138 138  K 4.3 4.8  CL 101 99  CO2 24 25  GLUCOSE 108* 123*  BUN 19 28*  CREATININE 1.46* 1.76*  CALCIUM 9.2 9.4    PT/INR: No results found for this basename: LABPROT, INR,  in the last 72 hours ABG    Component Value Date/Time   TCO2 22 06/29/2014 1653   CBG (last 3)   Recent Labs  07/03/14 2355 07/04/14 0345 07/04/14 0759  GLUCAP 135* 103* 102*    Assessment/Plan:  1. Type B Aortic Dissection- blood pressure remains elevated, but is improved- on Hydralazine and Labetolol 2. Pulm- Thoracentesis down yesterday on L, patient with left sided discomfort this morning, will get CXR to make sure no pneumothorax present 3. Neuro- LE movement remains about the same 4. Dispo- care per primary   LOS: 7 days    BARRETT, ERIN 07/06/2014  Patient seen and examined, agree with above. Some  left chest discomfort- check CXR

## 2014-07-07 DIAGNOSIS — N179 Acute kidney failure, unspecified: Secondary | ICD-10-CM

## 2014-07-07 LAB — BASIC METABOLIC PANEL
Anion gap: 14 (ref 5–15)
BUN: 32 mg/dL — AB (ref 6–23)
CHLORIDE: 100 meq/L (ref 96–112)
CO2: 20 meq/L (ref 19–32)
Calcium: 9.5 mg/dL (ref 8.4–10.5)
Creatinine, Ser: 1.6 mg/dL — ABNORMAL HIGH (ref 0.50–1.10)
GFR calc Af Amer: 42 mL/min — ABNORMAL LOW (ref >90)
GFR, EST NON AFRICAN AMERICAN: 37 mL/min — AB (ref >90)
Glucose, Bld: 115 mg/dL — ABNORMAL HIGH (ref 70–99)
POTASSIUM: 5.2 meq/L (ref 3.7–5.3)
SODIUM: 134 meq/L — AB (ref 137–147)

## 2014-07-07 MED ORDER — SODIUM CHLORIDE 0.9 % IJ SOLN
3.0000 mL | INTRAMUSCULAR | Status: DC | PRN
  Administered 2014-07-07: 3 mL via INTRAVENOUS

## 2014-07-07 MED ORDER — SODIUM CHLORIDE 0.9 % IJ SOLN
3.0000 mL | Freq: Two times a day (BID) | INTRAMUSCULAR | Status: DC
  Administered 2014-07-07 – 2014-07-08 (×2): 3 mL via INTRAVENOUS

## 2014-07-07 NOTE — Progress Notes (Addendum)
      [address]       [name] [id number]             [phone]      Subjective:  [name] has no new complaints.  She states she feels so/so.  Her left sided chest pain has improved.  Objective: Vital signs in last 24 hours: Temp:  [98.2 F (36.8 C)-98.8 F (37.1 C)] 98.6 F (37 C) (08/16 0433) Pulse Rate:  [66-91] 91 (08/16 0528) Cardiac Rhythm:  [-] Normal sinus rhythm (08/15 2112) Resp:  [16-18] 18 (08/16 0433) BP: (122-152)/(62-84) 128/62 mmHg (08/16 0618) SpO2:  [93 %-94 %] 94 % (08/16 0433) Weight:  [146 lb 12.8 oz (66.588 kg)] 146 lb 12.8 oz (66.588 kg) (08/16 0433)  Intake/Output from previous day: 08/15 0701 - 08/16 0700 In: 360 [P.O.:360] Out: 200 [Urine:200]  General appearance: alert, cooperative and no distress Heart: regular rate and rhythm Lungs: clear to auscultation bilaterally Ext: normal, LE some movement  Lab Results:  Recent Labs  07/05/14 0834  WBC 7.0  HGB 11.8*  HCT 34.8*  PLT 220   BMET:  Recent Labs  07/06/14 0116 07/07/14 0400  NA 138 134*  K 4.8 5.2  CL 99 100  CO2 25 20  GLUCOSE 123* 115*  BUN 28* 32*  CREATININE 1.76* 1.60*  CALCIUM 9.4 9.5    PT/INR: No results found for this basename: LABPROT, INR,  in the last 72 hours ABG    Component Value Date/Time   TCO2 22 06/29/2014 1653   CBG (last 3)  No results found for this basename: GLUCAP,  in the last 72 hours  Assessment/Plan:  1. Type B Aortic Dissection- blood pressure improving, continue Hydralazine and Labatelol 2. Pulm- CXR does not show pneumothorax, improvement in pleural fluid 3. Neuro- Lower extremity movement stable, will require rehab at discharge 4. Dispo- care per primary    LOS: 8 days    [name] 07/07/2014  Patient seen and examined, agree with above She says she didn't get OOB yesterday- will get up to chair BID

## 2014-07-07 NOTE — Progress Notes (Signed)
PROGRESS NOTE  [name] RUE:[id number] DOB: 1966/11/04 DOA: 06/29/2014 PCP: No PCP Per Patient  48 yo female smoker with weakness, dyspnea and chest pain. Had transient A fib and CT chest in ER showed Type B aortic dissection with concern for spinal cord infarct admitted by PCCM and spent 6 days in the ICU.  Drain placed in spinal cord, patient was seen by CV surgery and had thoracentesis on 8/14.   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, now with some paraplegia -PT recs CIR/SNF -lower BP goal  pulm effusion -s/p thoracentesis 8/14 -chest x ray, no pneumo, lessened fluid  AKI with h/o polycytic kidney disease -given dose of lasix -d/c lasix -trend -avoid nephrotoxic agents  HTN- goal of SBp < 130 -labetolol - hydralazine increase dose with better BP control  H/o tobacco abuse   Code Status:full Family Communication: patient Disposition Plan: SNF vs CIR   Consultants:  Vascular  PCCM  Procedures:  Thoracentesis 8/14  Spinal drain 8/8    HPI/Subjective: Continued chest pain   Objective: Filed Vitals:   07/07/14 0618  BP: 128/62  Pulse:   Temp:   Resp:     Intake/Output Summary (Last 24 hours) at 07/07/14 0803 Last data filed at 07/06/14 1849  Gross per 24 hour  Intake    120 ml  Output    200 ml  Net    -80 ml   Filed Weights   07/05/14 0427 07/06/14 0402 07/07/14 0433  Weight: 65.7 kg (144 lb 13.5 oz) 65.8 kg (145 lb 1 oz) 66.588 kg (146 lb 12.8 oz)    Exam:   General:  A+Ox3, NAD  Cardiovascular: rrr  Respiratory: clear anterior  Abdomen: +BS, soft  Musculoskeletal: LE weaker but moves all 4  Flat affect  Data Reviewed: Basic Metabolic Panel:  Recent Labs Lab 07/03/14 0228 07/04/14 0358 07/05/14 0834 07/06/14 0116 07/07/14 0400  NA 137 139 138 138 134*  K 4.3 4.8 4.3 4.8 5.2  CL 107 108 101 99 100  CO2 19 20 24 25 20   GLUCOSE 113* 107* 108* 123* 115*  BUN 24* 21 19  28* 32*  CREATININE 1.52* 1.33* 1.46* 1.76* 1.60*  CALCIUM 8.4 8.6 9.2 9.4 9.5  MG  --   --  2.0  --   --   PHOS  --   --  4.3  --   --    Liver Function Tests: No results found for this basename: AST, ALT, ALKPHOS, BILITOT, PROT, ALBUMIN,  in the last 168 hours No results found for this basename: LIPASE, AMYLASE,  in the last 168 hours No results found for this basename: AMMONIA,  in the last 168 hours CBC:  Recent Labs Lab 07/01/14 0350 07/02/14 0305 07/03/14 0228 07/04/14 0358 07/05/14 0834  WBC 8.7 8.5 8.3 7.3 7.0  NEUTROABS  --   --   --  4.8  --   HGB 12.3 12.2 10.8* 10.3* 11.8*  HCT 38.7 37.7 32.8* 31.5* 34.8*  MCV 85.8 85.5 84.3 84.7 83.7  PLT 180 173 170 175 220   Cardiac Enzymes: No results found for this basename: CKTOTAL, CKMB, CKMBINDEX, TROPONINI,  in the last 168 hours BNP (last 3 results) No results found for this basename: PROBNP,  in the last 8760 hours CBG:  Recent Labs Lab 07/03/14 1616 07/03/14 2011 07/03/14 2355 07/04/14 0345 07/04/14 0759  GLUCAP 116* 121* 135* 103* 102*    Recent Results (from the past 240  hour(s))  MRSA PCR SCREENING     Status: None   Collection Time    06/29/14  9:47 PM      Result Value Ref Range Status   MRSA by PCR NEGATIVE  NEGATIVE Final   Comment:            The GeneXpert MRSA Assay (FDA     approved for NASAL specimens     only), is one component of a     comprehensive MRSA colonization     surveillance program. It is not     intended to diagnose MRSA     infection nor to guide or     monitor treatment for     MRSA infections.  BODY FLUID CULTURE     Status: None   Collection Time    07/05/14  9:48 AM      Result Value Ref Range Status   Specimen Description PLEURAL LEFT   Final   Special Requests LEFT PLEURAL EFFUSION   Final   Gram Stain     Final   Value: RARE WBC PRESENT, PREDOMINANTLY PMN     NO ORGANISMS SEEN     [facility]formed at [facility]   Culture     Final   Value: NO GROWTH 1 DAY       Performed at [facility]   Report Status PENDING   Incomplete     Studies: Dg Chest 1 View  07/05/2014   CLINICAL DATA:  Post LEFT thoracentesis  EXAM: CHEST - 1 VIEW  COMPARISON:  07/04/2014  FINDINGS: Minimal enlargement of cardiac silhouette.  Mediastinal contours and pulmonary vascularity normal.  Elevation of LEFT diaphragm with mild bibasilar atelectasis.  Decreased LEFT pleural effusion since preceding exam.  No pneumothorax.  Upper lungs clear.  Bones unremarkable.  IMPRESSION: No pneumothorax following LEFT thoracentesis.  Bibasilar atelectasis with decreased LEFT pleural effusion since previous exam.   Electronically Signed   By: Ulyses SouthwardMark  Boles M.D.   On: 07/05/2014 10:24   Dg Chest Port 1 View  07/06/2014   CLINICAL DATA:  Left-sided chest pain, recent thoracentesis  EXAM: PORTABLE CHEST - 1 VIEW  COMPARISON:  Chest radiograph 8 14 2015  FINDINGS: No evidence of left-sided pneumothorax. There is interval decrease in pleural fluid on the left. Decrease in atelectasis. Right lung is clear.  IMPRESSION: No complication following left thoracentesis. No pneumothorax. Improved aeration left lung base.   Electronically Signed   By: Genevive BiStewart  Edmunds M.D.   On: 07/06/2014 19:14   [place] Thoracentesis Asp Pleural Space W/img Guide  07/05/2014   CLINICAL DATA:  Left pleural effusion  EXAM: ULTRASOUND GUIDED left THORACENTESIS  COMPARISON:  None.  PROCEDURE: An ultrasound guided thoracentesis was thoroughly discussed with the patient and questions answered. The benefits, risks, alternatives and complications were also discussed. The patient understands and wishes to proceed with the procedure. Written consent was obtained.  Ultrasound was performed to localize and mark an adequate pocket of fluid in the left chest. The area was then prepped and draped in the normal sterile fashion. 1% Lidocaine was used for local anesthesia. Under ultrasound guidance a 19 gauge Yueh catheter was introduced.  Thoracentesis was performed. The catheter was removed and a dressing applied.  Complications:  None.  FINDINGS: A total of approximately 300 cc of blood tinged fluid was removed. A fluid sample wassent for laboratory analysis.  IMPRESSION: Successful ultrasound guided left thoracentesis yielding 300 cc of pleural fluid.  Read by:  Robet Leu [facility]   Electronically Signed   By: Irish Lack M.D.   On: 07/05/2014 11:51    Scheduled Meds: . antiseptic oral rinse  7 mL Mouth Rinse BID  . docusate sodium  100 mg Oral Daily  . feeding supplement (ENSURE COMPLETE)  237 mL Oral TID BM  . hydrALAZINE  75 mg Oral 3 times per day  . labetalol  300 mg Oral BID  . pneumococcal 23 valent vaccine  0.5 mL Intramuscular Tomorrow-1000  . 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 If 7PM-7AM, please contact night-coverage at [email or web address], password [facility] 07/07/2014, 8:03 AM  LOS: 8 days

## 2014-07-08 ENCOUNTER — Inpatient Hospital Stay (HOSPITAL_COMMUNITY): Payer: Medicaid Other

## 2014-07-08 ENCOUNTER — Inpatient Hospital Stay (HOSPITAL_COMMUNITY)
Admission: RE | Admit: 2014-07-08 | Discharge: 2014-07-25 | DRG: 945 | Disposition: A | Discharge status: Home / Self Care | Payer: Medicaid Other | Source: Intra-hospital | Attending: Physical Medicine & Rehabilitation | Admitting: Physical Medicine & Rehabilitation

## 2014-07-08 DIAGNOSIS — I71012 Dissection of descending thoracic aorta: Secondary | ICD-10-CM

## 2014-07-08 DIAGNOSIS — E785 Hyperlipidemia, unspecified: Secondary | ICD-10-CM | POA: Diagnosis present

## 2014-07-08 DIAGNOSIS — G8220 Paraplegia, unspecified: Secondary | ICD-10-CM | POA: Diagnosis present

## 2014-07-08 DIAGNOSIS — M129 Arthropathy, unspecified: Secondary | ICD-10-CM | POA: Diagnosis present

## 2014-07-08 DIAGNOSIS — I10 Essential (primary) hypertension: Secondary | ICD-10-CM

## 2014-07-08 DIAGNOSIS — F17200 Nicotine dependence, unspecified, uncomplicated: Secondary | ICD-10-CM | POA: Diagnosis present

## 2014-07-08 DIAGNOSIS — G9782 Other postprocedural complications and disorders of nervous system: Secondary | ICD-10-CM

## 2014-07-08 DIAGNOSIS — I1310 Hypertensive heart and chronic kidney disease without heart failure, with stage 1 through stage 4 chronic kidney disease, or unspecified chronic kidney disease: Secondary | ICD-10-CM | POA: Diagnosis present

## 2014-07-08 DIAGNOSIS — G9519 Other vascular myelopathies: Secondary | ICD-10-CM | POA: Diagnosis present

## 2014-07-08 DIAGNOSIS — Z79899 Other long term (current) drug therapy: Secondary | ICD-10-CM

## 2014-07-08 DIAGNOSIS — F411 Generalized anxiety disorder: Secondary | ICD-10-CM | POA: Diagnosis present

## 2014-07-08 DIAGNOSIS — Q613 Polycystic kidney, unspecified: Secondary | ICD-10-CM | POA: Diagnosis not present

## 2014-07-08 DIAGNOSIS — Z7982 Long term (current) use of aspirin: Secondary | ICD-10-CM | POA: Diagnosis not present

## 2014-07-08 DIAGNOSIS — D649 Anemia, unspecified: Secondary | ICD-10-CM

## 2014-07-08 DIAGNOSIS — E871 Hypo-osmolality and hyponatremia: Secondary | ICD-10-CM | POA: Diagnosis present

## 2014-07-08 DIAGNOSIS — K219 Gastro-esophageal reflux disease without esophagitis: Secondary | ICD-10-CM | POA: Diagnosis present

## 2014-07-08 DIAGNOSIS — I4891 Unspecified atrial fibrillation: Secondary | ICD-10-CM | POA: Diagnosis present

## 2014-07-08 DIAGNOSIS — G9511 Acute infarction of spinal cord (embolic) (nonembolic): Secondary | ICD-10-CM

## 2014-07-08 DIAGNOSIS — F319 Bipolar disorder, unspecified: Secondary | ICD-10-CM | POA: Diagnosis present

## 2014-07-08 DIAGNOSIS — N183 Chronic kidney disease, stage 3 unspecified: Secondary | ICD-10-CM | POA: Diagnosis present

## 2014-07-08 DIAGNOSIS — F1410 Cocaine abuse, uncomplicated: Secondary | ICD-10-CM

## 2014-07-08 DIAGNOSIS — N390 Urinary tract infection, site not specified: Secondary | ICD-10-CM | POA: Diagnosis present

## 2014-07-08 DIAGNOSIS — I71019 Dissection of thoracic aorta, unspecified: Secondary | ICD-10-CM | POA: Diagnosis present

## 2014-07-08 DIAGNOSIS — I7101 Dissection of thoracic aorta: Secondary | ICD-10-CM | POA: Diagnosis present

## 2014-07-08 DIAGNOSIS — Z5189 Encounter for other specified aftercare: Secondary | ICD-10-CM | POA: Diagnosis present

## 2014-07-08 DIAGNOSIS — I7123 Aneurysm of the descending thoracic aorta, without rupture: Secondary | ICD-10-CM | POA: Diagnosis present

## 2014-07-08 DIAGNOSIS — I7100 Dissection of unspecified site of aorta: Secondary | ICD-10-CM

## 2014-07-08 LAB — BASIC METABOLIC PANEL
Anion gap: 14 (ref 5–15)
BUN: 34 mg/dL — ABNORMAL HIGH (ref 6–23)
CHLORIDE: 101 meq/L (ref 96–112)
CO2: 20 meq/L (ref 19–32)
Calcium: 9.4 mg/dL (ref 8.4–10.5)
Creatinine, Ser: 1.66 mg/dL — ABNORMAL HIGH (ref 0.50–1.10)
GFR calc Af Amer: 41 mL/min — ABNORMAL LOW (ref >90)
GFR, EST NON AFRICAN AMERICAN: 35 mL/min — AB (ref >90)
GLUCOSE: 109 mg/dL — AB (ref 70–99)
POTASSIUM: 4.8 meq/L (ref 3.7–5.3)
SODIUM: 135 meq/L — AB (ref 137–147)

## 2014-07-08 LAB — URINALYSIS, ROUTINE W REFLEX MICROSCOPIC
Bilirubin Urine: NEGATIVE
Bilirubin Urine: NEGATIVE
GLUCOSE, UA: NEGATIVE mg/dL
GLUCOSE, UA: NEGATIVE mg/dL
HGB URINE DIPSTICK: NEGATIVE
Hgb urine dipstick: NEGATIVE
Ketones, ur: NEGATIVE mg/dL
Ketones, ur: NEGATIVE mg/dL
Nitrite: POSITIVE — AB
Nitrite: POSITIVE — AB
PH: 5.5 (ref 5.0–8.0)
PROTEIN: NEGATIVE mg/dL
Protein, ur: NEGATIVE mg/dL
SPECIFIC GRAVITY, URINE: 1.017 (ref 1.005–1.030)
SPECIFIC GRAVITY, URINE: 1.018 (ref 1.005–1.030)
Urobilinogen, UA: 1 mg/dL (ref 0.0–1.0)
Urobilinogen, UA: 1 mg/dL (ref 0.0–1.0)
pH: 5.5 (ref 5.0–8.0)

## 2014-07-08 LAB — BODY FLUID CULTURE: Culture: NO GROWTH

## 2014-07-08 LAB — URINE MICROSCOPIC-ADD ON

## 2014-07-08 LAB — GLUCOSE, CAPILLARY: Glucose-Capillary: 125 mg/dL — ABNORMAL HIGH (ref 70–99)

## 2014-07-08 MED ORDER — ALUM & MAG HYDROXIDE-SIMETH 200-200-20 MG/5ML PO SUSP: 30.0000 mL | ORAL | Status: DC | PRN

## 2014-07-08 MED ORDER — PROCHLORPERAZINE MALEATE 5 MG PO TABS
5.0000 mg | ORAL_TABLET | Freq: Four times a day (QID) | ORAL | Status: DC | PRN
  Administered 2014-07-11 – 2014-07-12 (×2): 5 mg via ORAL
  Administered 2014-07-14: 10 mg via ORAL
  Administered 2014-07-17 – 2014-07-18 (×2): 5 mg via ORAL
  Filled 2014-07-08 (×3): qty 2

## 2014-07-08 MED ORDER — TRAMADOL HCL 50 MG PO TABS: 50.0000 mg | ORAL_TABLET | Freq: Four times a day (QID) | ORAL | Status: DC | PRN

## 2014-07-08 MED ORDER — ALUM & MAG HYDROXIDE-SIMETH 200-200-20 MG/5ML PO SUSP: 15.0000 mL | ORAL | Status: DC | PRN

## 2014-07-08 MED ORDER — LABETALOL HCL 300 MG PO TABS
300.0000 mg | ORAL_TABLET | Freq: Two times a day (BID) | ORAL | Status: DC
  Administered 2014-07-08 – 2014-07-25 (×34): 300 mg via ORAL
  Filled 2014-07-08 (×37): qty 1

## 2014-07-08 MED ORDER — ALUM & MAG HYDROXIDE-SIMETH 200-200-20 MG/5ML PO SUSP
15.0000 mL | ORAL | Status: DC | PRN
  Administered 2014-07-09 – 2014-07-17 (×7): 30 mL via ORAL
  Filled 2014-07-08 (×7): qty 30

## 2014-07-08 MED ORDER — FLEET ENEMA 7-19 GM/118ML RE ENEM: 1.0000 | ENEMA | Freq: Once | RECTAL | Status: AC | PRN

## 2014-07-08 MED ORDER — TRAZODONE HCL 50 MG PO TABS
25.0000 mg | ORAL_TABLET | Freq: Every evening | ORAL | Status: DC | PRN
  Administered 2014-07-08 – 2014-07-21 (×5): 50 mg via ORAL
  Filled 2014-07-08 (×7): qty 1

## 2014-07-08 MED ORDER — BISACODYL 10 MG RE SUPP
10.0000 mg | Freq: Every day | RECTAL | Status: DC | PRN
Start: 2014-07-08 — End: 2014-07-11

## 2014-07-08 MED ORDER — GUAIFENESIN-DM 100-10 MG/5ML PO SYRP: 5.0000 mL | ORAL_SOLUTION | Freq: Four times a day (QID) | ORAL | Status: DC | PRN

## 2014-07-08 MED ORDER — ALBUTEROL SULFATE (2.5 MG/3ML) 0.083% IN NEBU: 3.0000 mL | INHALATION_SOLUTION | RESPIRATORY_TRACT | Status: DC | PRN

## 2014-07-08 MED ORDER — ACETAMINOPHEN 325 MG PO TABS: 650.0000 mg | ORAL_TABLET | ORAL | Status: DC | PRN

## 2014-07-08 MED ORDER — DSS 100 MG PO CAPS: 100.0000 mg | ORAL_CAPSULE | Freq: Every day | ORAL | Status: DC

## 2014-07-08 MED ORDER — OXYCODONE HCL 5 MG PO TABS
10.0000 mg | ORAL_TABLET | ORAL | Status: DC | PRN
  Administered 2014-07-08: 10 mg via ORAL
  Administered 2014-07-09 (×2): 15 mg via ORAL
  Administered 2014-07-09: 10 mg via ORAL
  Administered 2014-07-10 (×3): 15 mg via ORAL
  Administered 2014-07-10: 10 mg via ORAL
  Administered 2014-07-11 (×2): 15 mg via ORAL
  Administered 2014-07-12 – 2014-07-15 (×10): 10 mg via ORAL
  Administered 2014-07-16: 15 mg via ORAL
  Administered 2014-07-16: 10 mg via ORAL
  Administered 2014-07-16: 15 mg via ORAL
  Administered 2014-07-17 – 2014-07-19 (×4): 10 mg via ORAL
  Administered 2014-07-19 (×2): 15 mg via ORAL
  Administered 2014-07-19: 5 mg via ORAL
  Administered 2014-07-20 (×3): 15 mg via ORAL
  Administered 2014-07-21: 10 mg via ORAL
  Administered 2014-07-21 (×2): 15 mg via ORAL
  Administered 2014-07-22: 10 mg via ORAL
  Administered 2014-07-22 – 2014-07-25 (×10): 15 mg via ORAL
  Filled 2014-07-08: qty 3
  Filled 2014-07-08 (×5): qty 2
  Filled 2014-07-08 (×3): qty 3
  Filled 2014-07-08 (×2): qty 2
  Filled 2014-07-08: qty 3
  Filled 2014-07-08 (×3): qty 2
  Filled 2014-07-08 (×5): qty 3
  Filled 2014-07-08: qty 2
  Filled 2014-07-08: qty 3
  Filled 2014-07-08: qty 2
  Filled 2014-07-08: qty 3
  Filled 2014-07-08 (×2): qty 2
  Filled 2014-07-08 (×4): qty 3
  Filled 2014-07-08: qty 2
  Filled 2014-07-08 (×3): qty 3
  Filled 2014-07-08 (×2): qty 2
  Filled 2014-07-08 (×2): qty 3
  Filled 2014-07-08: qty 2
  Filled 2014-07-08 (×5): qty 3
  Filled 2014-07-08 (×2): qty 2
  Filled 2014-07-08: qty 3

## 2014-07-08 MED ORDER — FLEET ENEMA 7-19 GM/118ML RE ENEM
1.0000 | ENEMA | Freq: Once | RECTAL | Status: AC
  Administered 2014-07-09: 1 via RECTAL
  Filled 2014-07-08: qty 1

## 2014-07-08 MED ORDER — TRAMADOL HCL 50 MG PO TABS
50.0000 mg | ORAL_TABLET | Freq: Four times a day (QID) | ORAL | Status: DC | PRN
  Administered 2014-07-19 – 2014-07-24 (×5): 50 mg via ORAL
  Filled 2014-07-08 (×6): qty 1

## 2014-07-08 MED ORDER — PROCHLORPERAZINE EDISYLATE 5 MG/ML IJ SOLN
5.0000 mg | Freq: Four times a day (QID) | INTRAMUSCULAR | Status: DC | PRN
  Filled 2014-07-08: qty 2

## 2014-07-08 MED ORDER — ENSURE COMPLETE PO LIQD: 237.0000 mL | Freq: Three times a day (TID) | ORAL | Status: DC

## 2014-07-08 MED ORDER — OXYCODONE HCL 5 MG PO TABS: 5.0000 mg | ORAL_TABLET | Freq: Four times a day (QID) | ORAL | Status: DC | PRN

## 2014-07-08 MED ORDER — SERTRALINE HCL 50 MG PO TABS
50.0000 mg | ORAL_TABLET | Freq: Every day | ORAL | Status: DC
  Administered 2014-07-08: 50 mg via ORAL
  Filled 2014-07-08: qty 1

## 2014-07-08 MED ORDER — GABAPENTIN 100 MG PO CAPS
100.0000 mg | ORAL_CAPSULE | Freq: Three times a day (TID) | ORAL | Status: DC
  Administered 2014-07-08 – 2014-07-19 (×32): 100 mg via ORAL
  Filled 2014-07-08 (×37): qty 1

## 2014-07-08 MED ORDER — METHOCARBAMOL 500 MG PO TABS
500.0000 mg | ORAL_TABLET | Freq: Four times a day (QID) | ORAL | Status: DC | PRN
  Administered 2014-07-10 – 2014-07-24 (×17): 500 mg via ORAL
  Filled 2014-07-08 (×17): qty 1

## 2014-07-08 MED ORDER — ENSURE COMPLETE PO LIQD
237.0000 mL | Freq: Three times a day (TID) | ORAL | Status: DC
  Administered 2014-07-09 – 2014-07-25 (×25): 237 mL via ORAL

## 2014-07-08 MED ORDER — SENNOSIDES-DOCUSATE SODIUM 8.6-50 MG PO TABS
2.0000 | ORAL_TABLET | Freq: Every day | ORAL | Status: DC
  Administered 2014-07-08 – 2014-07-13 (×6): 2 via ORAL
  Filled 2014-07-08 (×7): qty 2

## 2014-07-08 MED ORDER — PROCHLORPERAZINE 25 MG RE SUPP
12.5000 mg | Freq: Four times a day (QID) | RECTAL | Status: DC | PRN
  Filled 2014-07-08: qty 1

## 2014-07-08 MED ORDER — ACETAMINOPHEN 325 MG PO TABS: 325.0000 mg | ORAL_TABLET | ORAL | Status: DC | PRN

## 2014-07-08 MED ORDER — HYDRALAZINE HCL 100 MG PO TABS: 100.0000 mg | ORAL_TABLET | Freq: Three times a day (TID) | ORAL | Status: DC

## 2014-07-08 MED ORDER — HYDRALAZINE HCL 50 MG PO TABS
75.0000 mg | ORAL_TABLET | Freq: Three times a day (TID) | ORAL | Status: DC
  Administered 2014-07-08 – 2014-07-15 (×20): 75 mg via ORAL
  Filled 2014-07-08 (×24): qty 1

## 2014-07-08 MED ORDER — SERTRALINE HCL 50 MG PO TABS
50.0000 mg | ORAL_TABLET | Freq: Every day | ORAL | Status: DC
  Administered 2014-07-09 – 2014-07-22 (×14): 50 mg via ORAL
  Filled 2014-07-08 (×16): qty 1

## 2014-07-08 MED ORDER — LABETALOL HCL 300 MG PO TABS: 300.0000 mg | ORAL_TABLET | Freq: Two times a day (BID) | ORAL | Status: DC

## 2014-07-08 NOTE — Discharge Summary (Signed)
Physician Discharge Summary  [place]otte [name] WGN:[id number] DOB: [birth year] DOA: 06/29/2014  PCP: No PCP Per Patient  Admit date: 06/29/2014 Discharge date: 07/08/2014  Time spent: greater than 30 minutes  Recommendations for Follow-up:  1. Monitor creatinine 2. Adjust antihypertensive for goal blood pressure <130 3. Repeat UA 8/17 pending. Please follow up 4. 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 Patient had been sitting on sofa most of day when when she developed sudden severe retrosternal chest pain without radiation. She feels short of breath. She had nausea no vomiting. Initially EKG demonstrated sinus rhythm per EMS and then they report that she converted to atrial fibrillation with rapid ventricular response which now has since reverted to normal sinus rhythm. Patient describes the pain is severe at this time. She looks uncomfortable. She denies back pain. She denies neck pain or jaw pain. No prior history of cardiac disease. No history of DVT or pulmonary embolism. She and the family report that the patient was in the  normal state health earlier today without recent illness or trauma.  CTA of chest was done in the ER and reported as Type B dissection:   On exam of the patient she was unable to move her lower extremities. Family notes when she walks a lot her legs get weak but this morning she was walking around the house.  Family notes patient has leaning disability   Hospital Course:  48 yo female smoker with weakness, dyspnea and chest pain. Had transient A fib and CT chest in ER showed Type B aortic dissection with concern for spinal cord infarct admitted by PCCM and spent 6 days in the ICU. Drain placed in spinal cord, patient was seen by CV surgery and had thoracentesis on 8/14.   acute type B aortic dissection with spinal cord ischemic injury, now with some paraplegia: nonoperative management, and spinal drain recommended by Dr. Tyrone Sage.  starded on labetalol and nicardipine gtt. Anesthesia placed spinal drain. Some improvement in lower extremity strength R>L, but still with significant defecits.  Spinal drain has been removed. Transitioned to oral labetalol and hydralazine.  Goal SBP <130.  Will go to CIR for continued rehab.  pulm effusion  -s/p thoracentesis 8/14 by IR -chest x ray, no pneumo, lessened fluid   AKI with h/o polycytic kidney disease  Creatinine about at baseline  HTN- see above  H/o tobacco abuse   Depression: some confusion about patient stopping antipsychotics/antidepressives prior to admission, but for unknown amount of time.  Patient has not had problems with psychosis or agression, but does admit to feeling depressed.  Zoloft has been resumed.  Reported transient  a fib prior to admission. None further. Not an anticoagulation candidate at this time  On the day of discharge from [facility], RN reported dark urine and sent UA, results pending. Needs follow up.  Procedures:  Spinal drain (removed)  thoracentesis  Consultations:  TCTS  PCCM  Anesthesia  IR  Discharge  Exam: Filed Vitals:   07/08/14 1119  BP: 165/88  Pulse: 72  Temp:   Resp:     General: in chair. Alert, appropriate Cardiovascular: RRR without MGR Respiratory: CTA without WRR Ext no edema Neuro: motor 5/5 UE. RLE 4/5 strength. LLE strenght 2/5   Discharge Instructions   Diet - low sodium heart healthy    Complete by:  As directed      Walk with assistance    Complete by:  As directed             Medication List    STOP taking these medications       amLODipine 10 MG tablet  Commonly known as:  NORVASC     aspirin-acetaminophen-caffeine 250-250-65 MG per tablet  Commonly known as:  EXCEDRIN MIGRAINE     lisinopril-hydrochlorothiazide 20-12.5 MG per tablet  Commonly known as:  PRINZIDE,ZESTORETIC     naproxen sodium 220 MG tablet  Commonly known as:  ANAPROX     OLANZapine 5 MG tablet  Commonly known as:  ZYPREXA     SEROQUEL PO      TAKE these medications       acetaminophen 325 MG tablet  Commonly known as:  TYLENOL  Take 2 tablets (650 mg total) by mouth every 4 (four) hours as needed for mild pain, fever or headache.     albuterol (2.5 MG/3ML) 0.083% nebulizer solution  Commonly known as:  PROVENTIL  Inhale 3 mLs into the lungs every 2 (two) hours as needed for wheezing or shortness of breath.     alum & mag hydroxide-simeth 200-200-20 MG/5ML suspension  Commonly known as:  MAALOX/MYLANTA  Take 15-30 mLs by mouth every 2 (two) hours as needed for indigestion.     DSS 100 MG Caps  Take 100 mg by mouth daily.     feeding supplement (ENSURE COMPLETE) Liqd  Take 237 mLs by mouth 3 (three) times daily between meals.     hydrALAZINE 100 MG tablet  Commonly known as:  APRESOLINE  Take 1 tablet (100 mg total) by mouth every 8 (eight) hours.     labetalol 300 MG tablet  Commonly known as:  NORMODYNE  Take 1 tablet (300 mg total) by mouth 2 (two) times daily.     oxyCODONE 5 MG immediate release tablet  Commonly known as:  Oxy IR/ROXICODONE  Take  1-2 tablets (5-10 mg total) by mouth every 6 (six) hours as needed for severe pain.     traMADol 50 MG tablet  Commonly known as:  ULTRAM  Take 1 tablet (50 mg total) by mouth every 6 (six) hours as needed for moderate pain.     ZOLOFT PO 50 mg daily  Take 1 tablet by mouth daily.       Allergies  Allergen Reactions  . Seroquel [Quetiapine] Other (See Comments)    Night sweats, dizziness and possible confusion  . Olanzapine Nausea And Vomiting      The results of significant diagnostics from this hospitalization (including imaging, microbiology, ancillary and laboratory) are listed below for reference.    Significant Diagnostic Studies: Dg Chest 1 View  07/05/2014   CLINICAL DATA:  Post LEFT thoracentesis  EXAM: CHEST - 1 VIEW  COMPARISON:  07/04/2014  FINDINGS: Minimal enlargement of cardiac silhouette.  Mediastinal contours and pulmonary vascularity normal.  Elevation of LEFT diaphragm with mild bibasilar atelectasis.  Decreased LEFT pleural effusion since preceding exam.  No pneumothorax.  Upper lungs clear.  Bones unremarkable.  IMPRESSION: No pneumothorax following LEFT thoracentesis.  Bibasilar atelectasis with decreased LEFT pleural effusion since previous exam.   Electronically Signed   By: Ulyses Southward M.D.   On: 07/05/2014 10:24   Ct Angio Chest W/cm &/or Wo Cm  06/29/2014   ADDENDUM REPORT: 06/29/2014 20:00  ADDENDUM: Case was reviewed with Dr. Tyrone Sage at the time of addendum.  A 4 mm penetrating atherosclerotic ulcer is possible along the posterior aspect of the descending thoracic aorta (series 701/image 41). Although very subtle, a slight hyperdense crescent may be present on unenhanced imaging, reflecting intramural hematoma.  As such, while the findings remain compatible with acute aortic syndrome, this may reflect sequela of penetrating atherosclerotic ulcer rather than true aortic dissection.   Electronically Signed   By: Charline Bills M.D.   On: 06/29/2014 20:00    06/29/2014   CLINICAL DATA:  Chest/back pain, weakness, nausea. History of renal disorder.  EXAM: CT ANGIOGRAPHY CHEST AND ABDOMEN  TECHNIQUE: Multidetector CT imaging of the chest and abdomen was performed using the standard protocol during bolus administration of intravenous contrast. Multiplanar CT image reconstructions and MIPs were obtained to evaluate the vascular anatomy.  CONTRAST:  OMNIPAQUE IOHEXOL 350 MG/ML SOLN  COMPARISON:  Chest radiograph dated 06/29/2014. Unenhanced CT abdomen pelvis dated 04/20/2009.  FINDINGS: CTA CHEST FINDINGS  No evidence of intramural hematoma on unenhanced CT.  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  Although not tailored for evaluation of the pulmonary arteries, there is no evidence of pulmonary embolism.  Mild dependent atelectasis at the lung bases. No suspicious pulmonary nodules. No pleural effusion or pneumothorax.  Visualized thyroid is unremarkable.  Heart is normal in size.  No pericardial effusion.  No suspicious mediastinal, hilar, or axillary lymphadenopathy.  Visualized osseous structures are within normal limits.  Review of the MIP images confirms the above findings.  CTA ABDOMEN FINDINGS  Aortic dissection extends to the aortic bifurcation.  Celiac artery, SMA, IMA, and bilateral renal arteries all arise from the true lumen and remain patent.  Scattered hepatic cysts measuring up to 2.1 cm. Liver is otherwise within normal limits.  Spleen, pancreas, and adrenal glands are within normal limits.  Gallbladder is unremarkable. No intrahepatic or extrahepatic ductal dilatation.  Numerous bilateral renal cysts, compatible with polycystic renal disease. No hydronephrosis.  Visualized bowel is unremarkable.  No abdominal ascites.  No suspicious abdominal lymphadenopathy.  Visualized osseous structures are within normal limits.  Review of the MIP images confirms the above findings.  IMPRESSION: Type B aortic  dissection arising just distal to the origin of the left subclavian artery and extending to the aortic bifurcation.  Celiac artery, SMA, IMA, and bilateral renal arteries all arise from the true lumen and remain patent.  No evidence of intramural hematoma.  Additional ancillary findings as above.  These results were called by telephone at the time of interpretation on 06/29/2014 at 6:20 pm to Dr. Azalia Bilis , who verbally acknowledged these results.  Electronically Signed: By: Charline Bills M.D. On: 06/29/2014 18:34   Ct Angio Abdomen W/cm &/or Wo Contrast  06/29/2014   ADDENDUM REPORT: 06/29/2014 20:00  ADDENDUM: Case was reviewed with Dr. Tyrone SageGerhardt at the time of addendum.  A 4 mm penetrating atherosclerotic ulcer is possible along the posterior aspect of the descending thoracic aorta (series 701/image 41). Although very subtle, a slight hyperdense crescent may be present on unenhanced imaging, reflecting intramural hematoma.  As such, while the findings remain compatible with acute aortic syndrome, this may reflect sequela of penetrating atherosclerotic ulcer rather than true aortic dissection.   Electronically Signed   By: Charline BillsSriyesh  Krishnan M.D.   On: 06/29/2014 20:00   06/29/2014   CLINICAL DATA:  Chest/back pain, weakness, nausea. History of renal disorder.  EXAM: CT ANGIOGRAPHY CHEST AND ABDOMEN  TECHNIQUE: Multidetector CT imaging of the chest and abdomen was performed using the standard protocol during bolus administration of intravenous contrast. Multiplanar CT image reconstructions and MIPs were obtained to evaluate the vascular anatomy.  CONTRAST:  100mL OMNIPAQUE IOHEXOL 350 MG/ML SOLN  COMPARISON:  Chest radiograph dated 06/29/2014. Unenhanced CT abdomen pelvis dated 04/20/2009.  FINDINGS: CTA CHEST FINDINGS  No evidence of intramural hematoma on unenhanced CT.  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  Although not tailored for evaluation of the  pulmonary arteries, there is no evidence of pulmonary embolism.  Mild dependent atelectasis at the lung bases. No suspicious pulmonary nodules. No pleural effusion or pneumothorax.  Visualized thyroid is unremarkable.  Heart is normal in size.  No pericardial effusion.  No suspicious mediastinal, hilar, or axillary lymphadenopathy.  Visualized osseous structures are within normal limits.  Review of the MIP images confirms the above findings.  CTA ABDOMEN FINDINGS  Aortic dissection extends to the aortic bifurcation.  Celiac artery, SMA, IMA, and bilateral renal arteries all arise from the true lumen and remain patent.  Scattered hepatic cysts measuring up to 2.1 cm. Liver is otherwise within normal limits.  Spleen, pancreas, and adrenal glands are within normal limits.  Gallbladder is unremarkable. No intrahepatic or extrahepatic ductal dilatation.  Numerous bilateral renal cysts, compatible with polycystic renal disease. No hydronephrosis.  Visualized bowel is unremarkable.  No abdominal ascites.  No suspicious abdominal lymphadenopathy.  Visualized osseous structures are within normal limits.  Review of the MIP images confirms the above findings.  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  Celiac artery, SMA, IMA, and bilateral renal arteries all arise from the true lumen and remain patent.  No evidence of intramural hematoma.  Additional ancillary findings as above.  These results were called by telephone at the time of interpretation on 06/29/2014 at 6:20 pm to Dr. Azalia BilisKEVIN CAMPOS , who verbally acknowledged these results.  Electronically Signed: By: Charline BillsSriyesh  Krishnan M.D. On: 06/29/2014 18:34   Dg Chest Port 1 View  07/06/2014   CLINICAL DATA:  Left-sided chest pain, recent thoracentesis  EXAM: PORTABLE CHEST - 1 VIEW  COMPARISON:  Chest radiograph 8 14 2015  FINDINGS: No evidence of left-sided pneumothorax. There  is interval decrease in pleural fluid on the left. Decrease in atelectasis. Right lung is clear.  IMPRESSION: No complication following left thoracentesis. No pneumothorax. Improved aeration left lung base.   Electronically Signed   By: Genevive BiStewart  Edmunds M.D.   On: 07/06/2014 19:14   Dg Chest Port 1 View  07/04/2014   CLINICAL DATA:  Reassess atelectasis  EXAM: PORTABLE CHEST - 1 VIEW  COMPARISON:  . Chest CT scan of June 29, 2014 portable  chest x-ray of the same date  FINDINGS: The lungs are less well inflated today. The interstitial markings are mildly increased especially on the left. The retrocardiac region is dense consistent with atelectasis or pneumonia. The left hemidiaphragm is now obscured. The cardiac silhouette is mildly enlarged. The aortic knob is indistinct consistent with the known type B aortic dissection. The pulmonary vascularity is mildly engorged.  IMPRESSION: 1. There is left lower lobe atelectasis and/or pneumonia. 2. Increased pulmonary interstitial markings and mild cardiomegaly are consistent with CHF. 3. Indistinct aortic knob is consistent with the known aortic dissection.   Electronically Signed   By: David  [place]   On: 07/04/2014 08:05   Dg Chest Port 1 View  06/29/2014   CLINICAL DATA:  Chest pain  EXAM: PORTABLE CHEST - 1 VIEW  COMPARISON:  04/13/2010  FINDINGS: Lungs are clear.  No pleural effusion or pneumothorax.  The heart is normal in size.  IMPRESSION: No evidence of acute cardiopulmonary disease.   Electronically Signed   By: Charline Bills M.D.   On: 06/29/2014 17:16   Ct Angio Chest Aortic Dissect W &/or W/o  07/04/2014   CLINICAL DATA:  Type 3 aortic dissection.  Worsening chest pain.  EXAM: CT ANGIOGRAPHY CHEST, ABDOMEN AND PELVIS  TECHNIQUE: Multidetector CT imaging through the chest, abdomen and pelvis was performed using the standard protocol during bolus administration of intravenous contrast. Multiplanar reconstructed images and MIPs were obtained and  reviewed to evaluate the vascular anatomy.  CONTRAST:  OMNIPAQUE IOHEXOL 350 MG/ML SOLN  COMPARISON:  06/29/2014.  FINDINGS: CTA CHEST FINDINGS  Small intramural hematoma is identified on precontrast imaging. This was present on the prior exam although it is better visualized due to technique on today's study. The intramural hematoma extends from the aortic arch to the upper abdomen.  Intramural hematoma appears slightly decreased in thickness compared to the prior exam 06/29/2014. The intramural hematoma extends from the arch into the upper abdomen. There is no visible dissection flap. The small area of proximal descending thoracic aortic contrast suspicious for penetrating atherosclerotic ulcer is no longer visible on today's study.  In the interval since the prior exam, moderate to large LEFT and small RIGHT pleural effusions have developed with atelectasis. The pleural effusions are low-attenuation. There is no hemopericardium or hemothorax in this patient with acute aortic syndrome. Heart appears mildly enlarged and increased in size compared to prior, compatible with volume overload. The lungs show compressive atelectasis but no areas of consolidation. Coronary artery atherosclerosis is present. If office based assessment of coronary risk factors has not been performed, it is now recommended.  Review of the MIP images confirms the above findings.  CTA ABDOMEN AND PELVIS FINDINGS  Abdominal aortic atherosclerosis without aneurysm. No dissection flap is identified in the abdominal aorta. Bilateral iliofemoral mild atherosclerosis. No occlusion. Celiac axis, superior mesenteric artery and inferior mesenteric artery are patent.  Polycystic kidney disease is present with associated liver cysts. There is a large calculus in the LEFT inferior renal pole measuring 13 mm. This may be contained within a cyst or an a partially obstructed collecting system or calyceal diverticulum. Bowel appears within normal  limits. Liver and spleen normal. Small amount of ascites, probably secondary to volume overload. Mild anasarca changes are present in the subcutaneous tissues.  Review of the MIP images confirms the above findings.  IMPRESSION: 1. Evolving descending thoracic aortic intramural hematoma. Mildly decreased intramural hematoma evident on noncontrast imaging. Penetrating atherosclerotic ulcer is no longer visualized.  No dissection flap. 2. Development of moderate to large LEFT pleural effusion and small RIGHT pleural effusion. Both effusions layer dependently and produce substantial compressive atelectasis. 3. Polycystic kidney disease with associated liver cysts.   Electronically Signed   By: Andreas Newport M.D.   On: 07/04/2014 16:08   Ct Angio Abd/pel W/ And/or W/o  07/04/2014   CLINICAL DATA:  Type 3 aortic dissection.  Worsening chest pain.  EXAM: CT ANGIOGRAPHY CHEST, ABDOMEN AND PELVIS  TECHNIQUE: Multidetector CT imaging through the chest, abdomen and pelvis was performed using the standard protocol during bolus administration of intravenous contrast. Multiplanar reconstructed images and MIPs were obtained and reviewed to evaluate the vascular anatomy.  CONTRAST:  OMNIPAQUE IOHEXOL 350 MG/ML SOLN  COMPARISON:  06/29/2014.  FINDINGS: CTA CHEST FINDINGS  Small intramural hematoma is identified on precontrast imaging. This was present on the prior exam although it is better visualized due to technique on today's study. The intramural hematoma extends from the aortic arch to the upper abdomen.  Intramural hematoma appears slightly decreased in thickness compared to the prior exam 06/29/2014. The intramural hematoma extends from the arch into the upper abdomen. There is no visible dissection flap. The small area of proximal descending thoracic aortic contrast suspicious for penetrating atherosclerotic ulcer is no longer visible on today's study.  In the interval since the prior exam, moderate to large LEFT  and small RIGHT pleural effusions have developed with atelectasis. The pleural effusions are low-attenuation. There is no hemopericardium or hemothorax in this patient with acute aortic syndrome. Heart appears mildly enlarged and increased in size compared to prior, compatible with volume overload. The lungs show compressive atelectasis but no areas of consolidation. Coronary artery atherosclerosis is present. If office based assessment of coronary risk factors has not been performed, it is now recommended.  Review of the MIP images confirms the above findings.  CTA ABDOMEN AND PELVIS FINDINGS  Abdominal aortic atherosclerosis without aneurysm. No dissection flap is identified in the abdominal aorta. Bilateral iliofemoral mild atherosclerosis. No occlusion. Celiac axis, superior mesenteric artery and inferior mesenteric artery are patent.  Polycystic kidney disease is present with associated liver cysts. There is a large calculus in the LEFT inferior renal pole measuring 13 mm. This may be contained within a cyst or an a partially obstructed collecting system or calyceal diverticulum. Bowel appears within normal limits. Liver and spleen normal. Small amount of ascites, probably secondary to volume overload. Mild anasarca changes are present in the subcutaneous tissues.  Review of the MIP images confirms the above findings.  IMPRESSION: 1. Evolving descending thoracic aortic intramural hematoma. Mildly decreased intramural hematoma evident on noncontrast imaging. Penetrating atherosclerotic ulcer is no longer visualized. No dissection flap. 2. Development of moderate to large LEFT pleural effusion and small RIGHT pleural effusion. Both effusions layer dependently and produce substantial compressive atelectasis. 3. Polycystic kidney disease with associated liver cysts.   Electronically Signed   By: Andreas Newport M.D.   On: 07/04/2014 16:08   US Thoracentesis Asp Pleural Space W/img Guide  07/05/2014   CLINICAL  DATA:  Left pleural effusion  EXAM: ULTRASOUND GUIDED left THORACENTESIS  COMPARISON:  None.  PROCEDURE: An ultrasound guided thoracentesis was thoroughly discussed with the patient and questions answered. The benefits, risks, alternatives and complications were also discussed. The patient understands and wishes to proceed with the procedure. Written consent was obtained.  Ultrasound was performed to localize and mark an adequate pocket of fluid in the left chest. The area  was then prepped and draped in the normal sterile fashion. 1% Lidocaine was used for local anesthesia. Under ultrasound guidance a 19 gauge Yueh catheter was introduced. Thoracentesis was performed. The catheter was removed and a dressing applied.  Complications:  None.  FINDINGS: A total of approximately 300 cc of blood tinged fluid was removed. A fluid sample wassent for laboratory analysis.  IMPRESSION: Successful ultrasound guided left thoracentesis yielding 300 cc of pleural fluid.  Read by:  Robet Leu [facility]   Electronically Signed   By: Irish Lack M.D.   On: 07/05/2014 11:51   Echo Left ventricle: The cavity size was normal. There was moderate concentric hypertrophy. Systolic function was normal. The estimated ejection fraction was in the range of 60% to 65%. Wall motion was normal; there were no regional wall motion abnormalities. Doppler parameters are consistent with abnormal left ventricular relaxation (grade 1 diastolic dysfunction). The E/e&' ratio is <8, suggesting normal LV filling pressure. - Aorta: No clear dissection flap visualized in this study. The aortic arch size is generous, the descending aorta is well visualized and no evidence for dissection is noted. - Left atrium: The atrium was normal in size. - Tricuspid valve: There was mild regurgitation. - Pulmonary arteries: PA peak pressure: 26 mm Hg (S). - Pericardium, extracardiac: There was no pericardial effusion.  Impressions:  - LVEF 60-65%,  moderate concentric LVH, normal LA size, generous aortic arch without clear visualization of a dissection flap, normal descending aorta, diastolic dysfunction, normal LV fililng pressure, no pericardial effusion.  EKG Sinus tachycardia Probable left atrial enlargement Probable left ventricular hypertrophy Repol abnrm suggests ischemia, lateral leads Prolonged QT interval  Microbiology: Recent Results (from the past 240 hour(s))  MRSA PCR SCREENING     Status: None   Collection Time    06/29/14  9:47 PM      Result Value Ref Range Status   MRSA by PCR NEGATIVE  NEGATIVE Final   Comment:            The GeneXpert MRSA Assay (FDA     approved for NASAL specimens     only), is one component of a     comprehensive MRSA colonization     surveillance program. It is not     intended to diagnose MRSA     infection nor to guide or     monitor treatment for     MRSA infections.  BODY FLUID CULTURE     Status: None   Collection Time    07/05/14  9:48 AM      Result Value Ref Range Status   Specimen Description PLEURAL LEFT   Final   Special Requests LEFT PLEURAL EFFUSION   Final   Gram Stain     Final   Value: RARE WBC PRESENT, PREDOMINANTLY PMN     NO ORGANISMS SEEN     Performed at [facility]   Culture     Final   Value: NO GROWTH 3 DAYS     Performed at [facility]   Report Status 07/08/2014 FINAL   Final     Labs: Basic Metabolic Panel:  Recent Labs Lab 07/04/14 0358 07/05/14 0834 07/06/14 0116 07/07/14 0400 07/08/14 0340  NA 139 138 138 134* 135*  K 4.8 4.3 4.8 5.2 4.8  CL 108 101 99 100 101  CO2 20 24 25 20 20   GLUCOSE 107* 108* 123* 115* 109*  BUN 21 19 28* 32* 34*  CREATININE 1.33* 1.46*  1.76* 1.60* 1.66*  CALCIUM 8.6 9.2 9.4 9.5 9.4  MG  --  2.0  --   --   --   PHOS  --  4.3  --   --   --    Liver Function Tests: No results found for this basename: AST, ALT, ALKPHOS, BILITOT, PROT, ALBUMIN,  in the last 168 hours No results found for  this basename: LIPASE, AMYLASE,  in the last 168 hours No results found for this basename: AMMONIA,  in the last 168 hours CBC:  Recent Labs Lab 07/02/14 0305 07/03/14 0228 07/04/14 0358 07/05/14 0834  WBC 8.5 8.3 7.3 7.0  NEUTROABS  --   --  4.8  --   HGB 12.2 10.8* 10.3* 11.8*  HCT 37.7 32.8* 31.5* 34.8*  MCV 85.5 84.3 84.7 83.7  PLT 173 170 175 220   Cardiac Enzymes: No results found for this basename: CKTOTAL, CKMB, CKMBINDEX, TROPONINI,  in the last 168 hours BNP: BNP (last 3 results) No results found for this basename: PROBNP,  in the last 8760 hours CBG:  Recent Labs Lab 07/03/14 1616 07/03/14 2011 07/03/14 2355 07/04/14 0345 07/04/14 0759  GLUCAP 116* 121* 135* 103* 102*   Signed:  Jakala Herford L  [facility] 07/08/2014, 12:20 PM

## 2014-07-08 NOTE — PMR Pre-admission (Signed)
PMR Admission Coordinator Pre-Admission Assessment  Patient: Charlotte [name] is an 48 y.o., female MRN: [id number] DOB: 05/20/1966 Height: 5\' 5"  (165.1 cm) Weight: 66.588 kg (146 lb 12.8 oz)              Insurance Information Self pay with medicaid potential  Medicaid Application Date:        Case Manager:   Disability Application Date:        Case Worker:    Emergency Conservator, museum/gallery Information   Name Relation Home Work Mobile   [place],Crystal Daughter   [phone]   Acre,[name] Daughter [phone]  [phone]     Current Medical History  Patient Admitting Diagnosis:  Spinal cord infarct  History of Present Illness: A 48 y.o. female with history of HTN, CKD, polysubstance abuse in the past, bipolar disorder; who was admitted on 06/29/14 with sudden onset of severe chest pain with SOB and inability to move BLE. Patient noted to have A fib at admission. 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 Dr. Roseanne Reno recommended spinal drain for acute management as well as MRI of thoracic and lumbar spine recommended for work up. Spinal drain placed with improvement in LE strength. On nicardipine and labetalol drips for BP management. 2 D echo with EF 60-65% with grade on diastolic dysfunction. Drain removed today and therapy evaluations to be done. Chest pain improved and no plans for surgery? Patient will likely require prolonged rehab due to paraparesis and CIR recommended by MD.    Past Medical History  Past Medical History  Diagnosis Date  . Hypertension   . Family history of anesthesia complication     "alot of back pains from the epidurals"   . Complication of anesthesia     "alot of back pains from the epidurals"  . Anemia   . Depression   . GERD (gastroesophageal reflux disease)   . Daily headache   . Migraine     "sometimes twice/day" (07/05/2014)  . Stroke 06/29/2014    residual BLE weakness  . 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 Polycystic kidney disease     /notes 07/05/2014  . 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 Arthritis     "shoulders" (07/05/2014)  . Bipolar disorder   . Anxiety     Family History  family history is not on file.  Prior Rehab/Hospitalizations:  None   Current Medications  Current facility-administered medications:acetaminophen (TYLENOL) tablet 650 mg, 650 mg, Oral, Q4H PRN, Vishal Mungal, MD, 650 mg at 07/07/14 2341;  albuterol (PROVENTIL) (2.5 MG/3ML) 0.083% nebulizer solution 3 mL, 3 mL, Inhalation, Q2H PRN, Wadie Lessen, MD;  alum & mag hydroxide-simeth (MAALOX/MYLANTA) 200-200-20 MG/5ML suspension 15-30 mL, 15-30 mL, Oral, Q2H PRN, Delight Ovens, MD, 30 mL at 07/05/14 2000 antiseptic oral rinse (CPC / CETYLPYRIDINIUM CHLORIDE 0.05%) solution 7 mL, 7 mL, Mouth Rinse, BID, Nelda Bucks, MD, 7 mL at 07/08/14 0843;  docusate sodium (COLACE) capsule 100 mg, 100 mg, Oral, Daily, Delight Ovens, MD, 100 mg at 07/08/14 0843;  feeding supplement (ENSURE COMPLETE) (ENSURE COMPLETE) liquid 237 mL,  237 mL, Oral, TID BM, Heather Cornelison Pitts, RD, 237 mL at 07/07/14 1400 hydrALAZINE (APRESOLINE) injection 20 mg, 20 mg, Intravenous, Q4H PRN, Nelda Bucks, MD, 20 mg at 07/07/14 0500;  hydrALAZINE (APRESOLINE) tablet 75 mg, 75 mg, Oral, 3 times per day, Joseph Art, DO, 75 mg at 07/08/14 4034;  HYDROmorphone (DILAUDID) injection 1 mg, 1 mg, Intravenous, Q3H PRN, Joseph Art, DO, 1 mg at 07/08/14 1119 labetalol (NORMODYNE) tablet 300 mg, 300 mg, Oral, BID, Lonia Farber, MD,  300 mg at 07/08/14 1119;  ondansetron [facility]) injection 4 mg, 4 mg, Intravenous, Q6H PRN, Delight Ovens, MD, 4 mg at 07/07/14 1349;  oxyCODONE (Oxy IR/ROXICODONE) immediate release tablet 5-10 mg, 5-10 mg, Oral, Q6H PRN, Joseph Art, DO, 10 mg at 07/08/14 0843 pneumococcal 23 valent vaccine (PNU-IMMUNE) injection 0.5 mL, 0.5 mL, Intramuscular, Tomorrow-1000, Coralyn Helling, MD;  sertraline (ZOLOFT) tablet 50 mg, 50 mg, Oral, Daily, Christiane Ha, MD;  sodium chloride 0.9 % injection 3 mL, 3 mL, Intravenous, PRN, Joseph Art, DO, 3 mL at 07/07/14 1709;  sodium chloride 0.9 % injection 3 mL, 3 mL, Intravenous, Q12H, Jessica U Vann, DO, 3 mL at 07/08/14 0845 traMADol (ULTRAM) tablet 50 mg, 50 mg, Oral, Q6H PRN, Joseph Art, DO, 50 mg at 07/08/14 1143  Patients Current Diet: Parke Simmers  Precautions / Restrictions Precautions Precautions: Fall Restrictions Weight Bearing Restrictions: No   Prior Activity Level Community (5-7x/wk): Was active and went out daily.  Home Assistive Devices / Equipment Home Assistive Devices/Equipment: None Home Equipment: None  Prior Functional Level Prior Function Level of Independence: Independent with assistive device(s)  Current Functional Level Cognition  Overall Cognitive Status: Impaired/Different from baseline Orientation Level: Oriented X4 Safety/Judgement: Decreased awareness of safety;Decreased awareness of deficits General Comments: Pt agitated iniitally and not wanting to get up stated her daughter would come today and take her home she didn't need therapy.  Eventually she was able to calm down and participate however.    Extremity Assessment (includes Sensation/Coordination)  Upper Extremity Assessment: Overall WFL for tasks assessed  Lower Extremity Assessment: Generalized weakness;RLE deficits/detail;LLE deficits/detail  RLE Deficits / Details: weakness with testing, 1-2/5 all motions  LLE Deficits / Details: weakness with testing,  1-2/5 all motions    ADLs  Overall ADL's : Needs assistance/impaired Eating/Feeding: Set up;Sitting Grooming: Wash/dry hands;Wash/dry face;Set up;Sitting Grooming Details (indicate cue type and reason): supported sitting Upper Body Bathing: Supervision/ safety;Sitting Upper Body Bathing Details (indicate cue type and reason): supported sitting Lower Body Bathing: Total assistance;+2 for physical assistance;Sit to/from stand Upper Body Dressing : Minimal assistance;Sitting Lower Body Dressing: Sit to/from stand;+2 for physical assistance;Total assistance Lower Body Dressing Details (indicate cue type and reason): Pt required assistance to cross the LLE over the right for donning her sock.  She could not tolerate crossing the RLE over the left. Toilet Transfer: +2 for physical assistance;Maximal assistance;BSC Toileting- Clothing Manipulation and Hygiene: Total assistance;+2 for physical assistance;Sit to/from stand Functional mobility during ADLs: +2 for physical assistance;Maximal assistance General ADL Comments: Pt with limited endurance for selfcare tasks and needs +2 for sit to stand and toileting tasks.  Decreased ability to support her weight with her LEs during standing or with transfer.    Mobility  Overal bed mobility: Needs Assistance Bed Mobility: Supine to Sit Rolling: Mod assist Sidelying to sit: Min assist;+2 for physical assistance Supine to sit: +2 for physical assistance;Mod assist General bed mobility comments: Pt required assist to bring her trunk  into sitting from sidelying.    Transfers  Overall transfer level: Needs assistance Equipment used:  (face to face transfer with gait pad and +2 assist) Transfers: Sit to/from Stand;Stand Pivot Transfers Sit to Stand: +2 physical assistance;Max assist Stand pivot transfers: +2 physical assistance;Max assist Squat pivot transfers: Max assist General transfer comment: Pt unable to advance her LEs during transfer with right  being more impaired than the left.    Ambulation / Gait / Stairs / Wheelchair Mobility  Not able to ambulate at this time   Posture / Balance Dynamic Sitting Balance Sitting balance - Comments: Pt with posterior LOB when attempting to cross her LEs for dressing.    Special needs/care consideration BiPAP/CPAP No CPM No Continuous Drip IV No Dialysis No        Life Vest No Oxygen On O2 at 2L [place] at this time Special Bed No Trach Size No Wound Vac (area) No     Skin Dry skin                             Bowel mgmt: Last BM 07/05/14 Bladder mgmt: Catheter has been removed.  Urine is odorous, may have a UTI.  Is voiding. Diabetic mgmt No    Previous Home Environment Living Arrangements: Children Available Help at Discharge: Family Type of Home: House Home Layout: One level Home Access: Stairs to enter Entrance Stairs-Rails: None Entrance Stairs-Number of Steps: 1 Home Care Services: No Additional Comments: daughters at bedside, state that she will come stay with them post discharge  Discharge Living Setting Plans for Discharge Living Setting: House;Lives with (comment) (Plans to go home with daughter, [name].) Type of Home at Discharge: House Discharge Home Layout: One level Discharge Home Access: Level entry (Has small half step to porch entrance.) Does the patient have any problems obtaining your medications?: Yes (Describe) ("money problems; have orange card; some places don't honor it")  Social/Family/Support Systems Patient Roles: Parent (Has 2 daughters locally and 1 son with his father not local.) Contact Information: Foy [place] - daughter [phone] Anticipated Caregiver: [name], daughter and family Anticipated Caregiver's Contact Information: Orpah Clintonshley [place] - daughter [phone] Ability/Limitations of Caregiver: Dtr [name] is not working and can provide care. Caregiver Availability: 24/7 Discharge Plan Discussed with Primary Caregiver: Yes Is Caregiver In  Agreement with Plan?: Yes Does Caregiver/Family have Issues with Lodging/Transportation while Pt is in Rehab?: No  Goals/Additional Needs Patient/Family Goal for Rehab: PT/OT min assist goals Expected length of stay: 15-18 days Cultural Considerations: Christian Dietary Needs: Soft diet, thin liquids Equipment Needs: TBD Additional Information: I spoke with sister and mom today.  Mom tells me they will assist daughter, [name], to care for patient in [name]'s home. Pt/Family Agrees to Admission and willing to participate: Yes Program Orientation Provided & Reviewed with Pt/Caregiver Including Roles  & Responsibilities: Yes  Decrease burden of Care through IP rehab admission: N/A  Possible need for SNF placement upon discharge: Not planned, but potentially if family unable to manage after rehab stay.  Patient Condition: This patient's medical and functional status has changed since the consult dated: 07/03/14 in which the Rehabilitation Physician determined and documented that the patient's condition is appropriate for intensive rehabilitative care in an inpatient rehabilitation facility. See "History of Present Illness" (above) for medical update. Functional changes are: Currently requiring max assist +2 for stand pivot transfers. Patient's medical and functional status update has been discussed with the Rehabilitation physician and patient  remains appropriate for inpatient rehabilitation. Will admit to inpatient rehab today.  Preadmission Screen Completed By:  Trish Mage, 07/08/2014 12:46 PM ______________________________________________________________________   Discussed status with Dr. Riley Kill on 07/08/14 at 1248 and received telephone approval for admission today.  Admission Coordinator:  Trish Mage, time1248/Date08/17/15

## 2014-07-08 NOTE — Progress Notes (Signed)
Physical Therapy Treatment Patient Details Name: Charlotte [name] MRN: [id number] DOB: December 22, 1965 Today's Date: 07/08/2014    History of Present Illness 48 yo F with spinal cord ischemia secondary to aortic dissection. She does appear to have had some improvement in LE weakness compared to exam documented by Dr. Roseanne RenoStewart initially, however continues to be markedly impaired.     PT Comments    Pt admitted with above. Pt currently with functional limitations due to balance and endurance deficits. Pt is progressing with sitting balance and transfers as well as incr participation.   Pt will benefit from skilled PT to increase their independence and safety with mobility to allow discharge to the venue listed below.   Follow Up Recommendations  CIR;Supervision/Assistance - 24 hour     Equipment Recommendations  Other (comment) (TBA)    Recommendations for Other Services       Precautions / Restrictions Precautions Precautions: Fall Restrictions Weight Bearing Restrictions: No    Mobility  Bed Mobility Overal bed mobility: Needs Assistance Bed Mobility: Supine to Sit Rolling: Mod assist Sidelying to sit: Mod assist;+2 for physical assistance       General bed mobility comments: Pt required assist to bring her trunk into sitting from sidelying.  Transfers Overall transfer level: Needs assistance Equipment used: 2 person hand held assist Transfers: Sit to/from UGI CorporationStand;Stand Pivot Transfers Sit to Stand: +2 physical assistance;Max assist Stand pivot transfers: Mod assist;+2 physical assistance       General transfer comment: Pt unable to advance her LEs during transfer with right being more impaired than the left.  Ambulation/Gait                 Stairs            Wheelchair Mobility    Modified Rankin (Stroke Patients Only)       Balance Overall balance assessment: Needs assistance Sitting-balance support: Bilateral upper extremity supported;Feet  supported Sitting balance-Leahy Scale: Poor Sitting balance - Comments: Pt balance varied from min guard assist for seconds at a time to mod asssit when she would get overbalanced.  Pt sat a total of 10 minutes at EOB total.  Can perform reaching but loses balance easily.   Postural control: Posterior lean   Standing balance-Leahy Scale: Poor Standing balance comment: Unable to support her weight with bil knee buckling.                      Cognition Arousal/Alertness: Awake/alert Behavior During Therapy: Anxious Overall Cognitive Status: Impaired/Different from baseline Area of Impairment: Awareness;Problem solving;Safety/judgement         Safety/Judgement: Decreased awareness of safety;Decreased awareness of deficits Awareness: Intellectual Problem Solving: Slow processing;Requires verbal cues      Exercises General Exercises - Lower Extremity Ankle Circles/Pumps: AAROM;Both;5 reps;Supine Heel Slides: AAROM;Both;5 reps;Supine Hip ABduction/ADduction: AAROM;Both;5 reps;Supine    General Comments        Pertinent Vitals/Pain Pain Assessment: 0-10 Pain Score: 7  Pain Location: back pain Pain Descriptors / Indicators: Nagging Pain Intervention(s): Monitored during session;Premedicated before session;Repositioned VSS    Home Living                      Prior Function            PT Goals (current goals can now be found in the care plan section) Progress towards PT goals: Progressing toward goals    Frequency  Min 4X/week    PT Plan Current plan  remains appropriate    Co-evaluation             End of Session Equipment Utilized During Treatment: Gait belt Activity Tolerance: Patient limited by fatigue;Patient limited by pain Patient left: in chair;with call bell/phone within reach     Time: 0912-0938 PT Time Calculation (min): 26 min  Charges:  $Therapeutic Activity: 23-37 mins                    G Codes:       INGOLD,Christophere Hillhouse 07/26/2014, 1:38 PM [facility] [phone] (pager)

## 2014-07-08 NOTE — Progress Notes (Signed)
Rehab admissions - Evaluated for possible admission.  I met with patient, her mom and her sister.  Patient would like to come to inpatient rehab and hopes to discharge home with her daughter, Caryl Pina.  No further interventions planned by medical team at this time.  Bed available and will admit to inpatient rehab today.  Call me for questions.  #[phone]

## 2014-07-08 NOTE — Progress Notes (Signed)
[facility] given to [facility].  [facility] to be transferred to [facility] shortly.  [facility] Center[facility],Emmy Keng 07/08/2014 4:39 PM

## 2014-07-08 NOTE — H&P (Signed)
Physical Medicine and Rehabilitation Admission H&P      Chief Complaint   Patient presents with   .  Paraparesis      HPI:  Charlotte [name] is a 48 y.o. female with history of HTN, CKD, polysubstance abuse in the past, bipolar disorder; who was admitted on 06/29/14 with sudden onset of severe chest pain with SOB and inability to move BLE. Patient noted to have A fib at admission. 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 Dr. Nicole Kindred recommended spinal drain for acute management as well as MRI of thoracic and lumbar spine recommended for work up. Spinal drain placed with improvement in LE strength. On nicardipine and labetalol drips for BP management. 2 D echo with EF 60-65% with grade on diastolic dysfunction.  Chest pain improved and no plans for surgery. She developed chest wall pain with SOB on 08/13 and CTA chest with evolving descending thoracic aortic intramural hematoma and development of moderate to large left pleural effusion and small right pleural effusion. Left effusion tapped for 300 cc by IVR with improvement in symptoms. Therapy ongoing with improvement in tolerance at EOB and unable to stand due to BLE instability. Patient will require prolonged rehab due to paraparesis. CIR recommended by MD and therapy team therefore patient admitted today.       Review of Systems  HENT: Negative for hearing loss.   Respiratory: Negative for shortness of breath and wheezing.   Cardiovascular: Positive for chest pain (continues to have chest wall pain?).  Gastrointestinal: Positive for heartburn, nausea, vomiting and constipation. Negative for abdominal  pain.  Genitourinary:        Problems voiding.   Musculoskeletal: Positive for back pain and joint pain.  Neurological: Positive for tingling, sensory change, speech change and weakness. Negative for headaches.  Psychiatric/Behavioral: Positive for depression. The patient is nervous/anxious.      Past Medical History   Diagnosis  Date   .  Hypertension     .  Family history of anesthesia complication         "alot of back pains from the epidurals"   .  Complication of anesthesia         "alot of back pains from the epidurals"   .  Anemia     .  Depression     .  GERD (gastroesophageal reflux disease)     .  Daily headache     .  Migraine         "sometimes twice/day" (07/05/2014)   .  Stroke  06/29/2014       residual BLE weakness   .  Dissecting aneurysm of thoracic aorta, Stanford type B  06/29/2014       Archie Endo 07/05/2014   .  Polycystic kidney disease         /notes 07/05/2014   .  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  Arthritis         "shoulders" (07/05/2014)   .  Bipolar disorder     .  Anxiety      Past Surgical  History   Procedure  Laterality  Date   .  Cesarean section    1986   .  Tubal ligation    1986   .  Spinal drain placement    06/29/2014   .  Spinal drain removal    07/02/2014   .  Thoracentesis    07/05/2014       Archie Endo 07/05/2014    History reviewed. No pertinent family history.   Social History: Lives alone in 2 nd floor apartment. Has two daughters in town. She reports that she has been smoking Cigarettes. She has been smoking about 1-2 PPD. She has never used smokeless tobacco. Has history of alcohol abuse and cocaine use in the past      Allergies   Allergen  Reactions   .  Seroquel [Quetiapine]  Other (See Comments)       Night sweats, dizziness and possible confusion   .  Olanzapine  Nausea And Vomiting    Medications Prior to Admission   Medication  Sig  Dispense  Refill   .  amLODipine  (NORVASC) 10 MG tablet  Take 10 mg by mouth daily.         [name]  aspirin-acetaminophen-caffeine (EXCEDRIN MIGRAINE) 250-250-65 MG per tablet  Take 1 tablet by mouth every 6 (six) hours as needed for headache.         .  lisinopril-hydrochlorothiazide (PRINZIDE,ZESTORETIC) 20-12.5 MG per tablet  Take 1 tablet by mouth daily.         .  naproxen sodium (ANAPROX) 220 MG tablet  Take 220 mg by mouth daily as needed (for pain).         [name]  OLANZapine (ZYPREXA) 5 MG tablet  Take 5 mg by mouth at bedtime.         [name]  QUEtiapine Fumarate (SEROQUEL PO)  Take 1 tablet by mouth at bedtime.         .  Sertraline HCl (ZOLOFT PO)  Take 1 tablet by mouth daily.            Home: Home Living Family/patient expects to be discharged to:: Inpatient rehab Living Arrangements: Children Available Help at Discharge: Family Type of Home: House Home Access: Stairs to enter Technical brewer of Steps: 1 Entrance Stairs-Rails: None Home Layout: One level Home Equipment: None Additional Comments: daughters at bedside, state that she will come stay with them post discharge    Functional History: Prior Function Level of Independence: Independent with assistive device(s)   Functional Status:   Mobility: Bed Mobility Overal bed mobility: Needs Assistance Bed Mobility: Supine to Sit Rolling: Mod assist Sidelying to sit: Mod assist;+2 for physical assistance Supine to sit: +2 for physical assistance;Mod assist General bed mobility comments: Pt required assist to bring her trunk into sitting from sidelying. Transfers Overall transfer level: Needs assistance Equipment used: 2 person hand held assist Transfers: Sit to/from [facility] Sit to Stand: +2 physical assistance;Max assist Stand pivot transfers: Mod assist;+2 physical assistance Squat pivot transfers: Max assist General transfer comment: Pt unable to advance her LEs during transfer with right being more impaired than the  left.   ADL: ADL Overall ADL's : Needs assistance/impaired Eating/Feeding: Set up;Sitting Grooming: Wash/dry hands;Wash/dry face;Set up;Sitting Grooming Details (indicate cue type and reason): supported sitting Upper Body Bathing: Supervision/ safety;Sitting Upper Body Bathing Details (indicate cue type and reason): supported sitting Lower Body Bathing: Total assistance;+2 for physical assistance;Sit to/from stand Upper Body Dressing : Minimal assistance;Sitting Lower Body  Dressing: Sit to/from stand;+2 for physical assistance;Total assistance Lower Body Dressing Details (indicate cue type and reason): Pt required assistance to cross the LLE over the right for donning her sock.  She could not tolerate crossing the RLE over the left. Toilet Transfer: +2 for physical assistance;Maximal assistance;BSC Toileting- Clothing Manipulation and Hygiene: Total assistance;+2 for physical assistance;Sit to/from stand Functional mobility during ADLs: +2 for physical assistance;Maximal assistance General ADL Comments: Pt with limited endurance for selfcare tasks and needs +2 for sit to stand and toileting tasks.  Decreased ability to support her weight with her LEs during standing or with transfer.   Cognition: Cognition Overall Cognitive Status: Impaired/Different from baseline Orientation Level: Oriented X4 Cognition Arousal/Alertness: Awake/alert Behavior During Therapy: Anxious Overall Cognitive Status: Impaired/Different from baseline Area of Impairment: Awareness;Problem solving;Safety/judgement Safety/Judgement: Decreased awareness of safety;Decreased awareness of deficits Awareness: Intellectual Problem Solving: Slow processing;Requires verbal cues General Comments: Pt agitated iniitally and not wanting to get up stated her daughter would come today and take her home she didn't need therapy.  Eventually she was able to calm down and participate however.   Physical Exam: Blood pressure  151/74, pulse 87, temperature 98.7 F (37.1 C), temperature source Oral, resp. rate 17, height 5' 5" (1.651 m), weight 66.588 kg (146 lb 12.8 oz), SpO2 95.00%.   Constitutional: [name], place, and time. She appears well-developed and well-nourished.   HENT:   Head: Normocephalic and atraumatic.   Eyes: Conjunctivae are normal. Pupils are equal, round, and reactive to light.   Neck: Normal range of motion. Neck supple.   Cardiovascular: Regular rhythm.   Respiratory: Effort normal and breath sounds normal. No respiratory distress. She has no wheezes.   GI: Soft. Bowel sounds are normal. She exhibits no distension. There is no tenderness.  Musculoskeletal: She exhibits no edema.  Neurological: She is alert and oriented to person, place, and time.  LLE>RLE weakness. 1-2/5 with HF/KE (inconsistent effort) 2+/5 PF/DF.   Upper extremities strength 4+/5 deltoid, bicep, tricep, grip   Diminished sensation to PP/LT from breasts distally, 1+/2   Skin: Skin is warm and dry.  Psychiatric: Her speech is normal. Thought content normal. Her affect vassallates between withdrawn and irritable.    Results for orders placed during the hospital encounter of 06/29/14 (from the past 48 hour(s))   BASIC METABOLIC PANEL     Status: Abnormal     Collection Time      07/07/14  4:00 AM       Result  Value  Ref Range     Sodium  134 (*)  137 - 147 mEq/L     Potassium  5.2   3.7 - 5.3 mEq/L     Chloride  100   96 - 112 mEq/L     CO2  20   19 - 32 mEq/L     Glucose, Bld  115 (*)  70 - 99 mg/dL     BUN  32 (*)  6 - 23 mg/dL     Creatinine, Ser  1.60 (*)  0.50 - 1.10 mg/dL     Calcium  9.5   8.4 - 10.5 mg/dL     GFR calc non Af Amer  37 (*)  >90 mL/min     GFR calc Af Amer  42 (*)  >90 mL/min     Comment:  (NOTE)        The eGFR has been calculated using the CKD EPI equation.  This calculation has not been validated in all clinical situations.        eGFR's persistently <90 mL/min  signify possible Chronic Kidney        Disease.     Anion gap  14   5 - 15   BASIC METABOLIC PANEL     Status: Abnormal     Collection Time      07/08/14  3:40 AM       Result  Value  Ref Range     Sodium  135 (*)  137 - 147 mEq/L     Potassium  4.8   3.7 - 5.3 mEq/L     Chloride  101   96 - 112 mEq/L     CO2  20   19 - 32 mEq/L     Glucose, Bld  109 (*)  70 - 99 mg/dL     BUN  34 (*)  6 - 23 mg/dL     Creatinine, Ser  1.66 (*)  0.50 - 1.10 mg/dL     Calcium  9.4   8.4 - 10.5 mg/dL     GFR calc non Af Amer  35 (*)  >90 mL/min     GFR calc Af Amer  41 (*)  >90 mL/min     Comment:  (NOTE)        The eGFR has been calculated using the CKD EPI equation.        This calculation has not been validated in all clinical situations.        eGFR's persistently <90 mL/min signify possible Chronic Kidney        Disease.     Anion gap  14   5 - 15   URINALYSIS, ROUTINE W REFLEX MICROSCOPIC     Status: Abnormal     Collection Time      07/08/14 11:47 AM       Result  Value  Ref Range     Color, Urine  AMBER (*)  YELLOW     Comment:  BIOCHEMICALS MAY BE AFFECTED BY COLOR     APPearance  CLOUDY (*)  CLEAR     Specific Gravity, Urine  1.017   1.005 - 1.030     pH  5.5   5.0 - 8.0     Glucose, UA  NEGATIVE   NEGATIVE mg/dL     Hgb urine dipstick  NEGATIVE   NEGATIVE     Bilirubin Urine  NEGATIVE   NEGATIVE     Ketones, ur  NEGATIVE   NEGATIVE mg/dL     Protein, ur  NEGATIVE   NEGATIVE mg/dL     Urobilinogen, UA  1.0   0.0 - 1.0 mg/dL     Nitrite  POSITIVE (*)  NEGATIVE     Leukocytes, UA  SMALL (*)  NEGATIVE   URINE MICROSCOPIC-ADD ON     Status: Abnormal     Collection Time      07/08/14 11:47 AM       Result  Value  Ref Range     Squamous Epithelial / LPF  MANY (*)  RARE     WBC, UA  7-10   <3 WBC/hpf     RBC / HPF  0-2   <3 RBC/hpf     Bacteria, UA  MANY (*)  RARE    Dg Chest Port 1 View   07/06/2014   CLINICAL DATA:  Left-sided chest pain, recent thoracentesis  EXAM: PORTABLE  CHEST - 1 VIEW  COMPARISON:  Chest radiograph 8 14 2015  FINDINGS: No evidence of left-sided pneumothorax. There is interval decrease in pleural fluid on the left. Decrease in atelectasis. Right lung is clear.  IMPRESSION: No complication following left thoracentesis. No pneumothorax. Improved aeration left lung base.   Electronically Signed   By: Suzy Bouchard M.D.   On: 07/06/2014 19:14           Medical Problem List and Plan: 1. 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  DVT Prophylaxis/Anticoagulation: Mechanical:  Antiembolism stockings, knee (TED hose) Bilateral lower extremities Sequential compression devices, below knee Bilateral lower extremitie 3. Pain Management: Will increase oxycodone to 15 mg and discontinue prn dilaudid.  Will add   4. Mood: LCSW to follow for evaluation and support.   5. Neuropsych: This patient is capable of making decisions on her own behalf. 6. Skin/Wound Care: Turn patient every 2 hours. Pressure relief measures. Maintain adequate hydration and nutritional status.   7. Hyponatremia: Check follow up labs in am. 8. Acute renal failure: push po fluids. Monitor for orthostasis.    9.  Constipation: Will set bowel program as no BM since 8/14. Check KUB. Enema today.        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 Patient is admitted to receive collaborative, interdisciplinary care between the physiatrist, rehab nursing staff, and therapy team. Patient's level of medical complexity and substantial therapy needs in context of that medical necessity cannot be provided at a lesser intensity of care such as a SNF. Patient has experienced substantial functional loss from his/her baseline which was documented above under the "Functional History" and "Functional Status"  headings.  Judging by the patient's diagnosis, physical exam, and functional history, the patient has potential for functional progress which will result in measurable gains while on inpatient rehab.  These gains will be of substantial and practical use upon discharge  in facilitating mobility and self-care at the household level. Physiatrist will provide 24 hour management of medical needs as well as oversight of the therapy plan/treatment and provide guidance as appropriate regarding the interaction of the two. 24 hour rehab nursing will assist with bladder management, bowel management, safety, skin/wound care, disease management, medication administration, pain management and patient education  and help integrate therapy concepts, techniques,education, etc. PT will assess and treat for/with: Lower extremity strength, range of motion, stamina, balance, functional mobility, safety, adaptive techniques and equipment, NMR, pain mgt, splinting/orthotics.   Goals are: mod I to supervision. OT will assess and treat for/with: ADL's, functional mobility, safety, upper extremity strength, adaptive techniques and equipment, NMR, pain mgt, ego support, leisure awareness.   Goals are: mod I to min assist SLP will assess and treat for/with: n/a.  Goals are: n/a. Case Management and Social Worker will assess and treat for psychological issues and discharge planning. Team conference will be held weekly to assess progress toward goals and to determine barriers to discharge. Patient will receive at least 3 hours of therapy per day at least 5 days per week. ELOS: 20-22 days days        Prognosis:  excellent         Meredith Staggers, MD, [facility]  07/08/2014

## 2014-07-08 NOTE — Progress Notes (Signed)
CSW (Clinical Child psychotherapistocial Worker) aware of consult. Per rehab admissions coordinator note, plan is for pt to dc to CIR. Please reconsult should new hospital social work needs arise.  Sonam Huelsmann, LCSWA [phone]

## 2014-07-08 NOTE — Progress Notes (Addendum)
       [address]       [place] [id number]             [phone]               Subjective: Still having left sided chest pain. Breathing stable.  "Hurts to move", so she did not get up yesterday.   Objective: Vital signs in last 24 hours: Patient Vitals for the past 24 hrs:  BP Temp Temp src Pulse Resp SpO2  07/08/14 0419 136/77 mmHg 98.7 F (37.1 C) Oral 56 17 95 %  07/07/14 2338 123/66 mmHg 98.9 F (37.2 C) Oral 68 17 97 %  07/07/14 2000 155/80 mmHg 98.9 F (37.2 C) Oral 74 17 98 %  07/07/14 1710 127/71 mmHg - - 84 18 97 %  07/07/14 1448 148/79 mmHg 98.1 F (36.7 C) Oral 77 18 93 %   Current Weight  07/07/14 146 lb 12.8 oz (66.588 kg)     Intake/Output from previous day: 08/16 0701 - 08/17 0700 In: 120 [P.O.:120] Out: 700 [Urine:700]    PHYSICAL EXAM:  Heart: RRR Lungs: Clear, slightly diminished BS in base Extremities: Warm, well perfused    Lab Results: CBC: Recent Labs  07/05/14 0834  WBC 7.0  HGB 11.8*  HCT 34.8*  PLT 220   BMET:  Recent Labs  07/07/14 0400 07/08/14 0340  NA 134* 135*  K 5.2 4.8  CL 100 101  CO2 20 20  GLUCOSE 115* 109*  BUN 32* 34*  CREATININE 1.60* 1.66*  CALCIUM 9.5 9.4    PT/INR: No results found for this basename: LABPROT, INR,  in the last 72 hours    Assessment/Plan: Type B aortic dissection- BPs generally stable. Continue current meds. S/p L thoracentesis- improved symptomatically and by CXR.  Continue to monitor. Neuro- stable.  Mobilize as tolerated, PT/OT. CKD- Cr stable.  Watch. Other issues per medical service.    LOS: 9 days    [name],Charlotte H 07/08/2014  Chest xray improved after thoracentesis I have seen and examined Charlotte [name] and agree with the above assessment  and plan.  Charlotte OvensEdward B Alverto Shedd MD [facility] [phone] Office [phone] 07/08/2014 3:02 PM

## 2014-07-08 NOTE — Care Management Note (Unsigned)
    Page 1 of 1   07/08/2014     2:01:44 PM CARE MANAGEMENT NOTE 07/08/2014  Patient:  Charlotte [name]   Account Number:  [id number]  Date Initiated:  07/04/2014  Documentation initiated by:  Ronny FlurryWILE,HEATHER  Subjective/Objective Assessment:   s/P type III aortic dissection with spinal cord ischemic injury     Action/Plan:   Anticipated DC Date:  07/08/2014   Anticipated DC Plan:  IP REHAB FACILITY      DC Planning Services  CM consult  Medication Assistance      Choice offered to / List presented to:             Status of service:  Completed, signed off Medicare Important Message given?   (If response is "NO", the following Medicare IM given date fields will be blank) Date Medicare IM given:   Medicare IM given by:   Date Additional Medicare IM given:   Additional Medicare IM given by:    Discharge Disposition:  IP REHAB FACILITY  Per UR Regulation:  Reviewed for med. necessity/level of care/duration of stay  If discussed at Long Length of Stay Meetings, dates discussed:   07/04/2014    Comments:  07/08/14 Charlotte AceJulie Zoa Dowty, RN, BSN [phone] [facility]

## 2014-07-09 ENCOUNTER — Inpatient Hospital Stay (HOSPITAL_COMMUNITY): Payer: Medicaid Other

## 2014-07-09 LAB — CBC WITH DIFFERENTIAL/PLATELET
BASOS ABS: 0 10*3/uL (ref 0.0–0.1)
BASOS PCT: 0 % (ref 0–1)
EOS PCT: 3 % (ref 0–5)
Eosinophils Absolute: 0.2 10*3/uL (ref 0.0–0.7)
HEMATOCRIT: 32.7 % — AB (ref 36.0–46.0)
Hemoglobin: 10.8 g/dL — ABNORMAL LOW (ref 12.0–15.0)
Lymphocytes Relative: 14 % (ref 12–46)
Lymphs Abs: 1 10*3/uL (ref 0.7–4.0)
MCH: 27.8 pg (ref 26.0–34.0)
MCHC: 33 g/dL (ref 30.0–36.0)
MCV: 84.3 fL (ref 78.0–100.0)
MONO ABS: 0.7 10*3/uL (ref 0.1–1.0)
Monocytes Relative: 10 % (ref 3–12)
NEUTROS ABS: 5.3 10*3/uL (ref 1.7–7.7)
Neutrophils Relative %: 73 % (ref 43–77)
Platelets: 326 10*3/uL (ref 150–400)
RBC: 3.88 MIL/uL (ref 3.87–5.11)
RDW: 14.7 % (ref 11.5–15.5)
WBC: 7.2 10*3/uL (ref 4.0–10.5)

## 2014-07-09 LAB — TROPONIN I

## 2014-07-09 LAB — CK TOTAL AND CKMB (NOT AT ARMC)
CK TOTAL: 117 U/L (ref 7–177)
CK, MB: 2.5 ng/mL (ref 0.3–4.0)
RELATIVE INDEX: 2.1 (ref 0.0–2.5)

## 2014-07-09 LAB — COMPREHENSIVE METABOLIC PANEL
ALBUMIN: 2.5 g/dL — AB (ref 3.5–5.2)
ALK PHOS: 178 U/L — AB (ref 39–117)
ALT: 117 U/L — ABNORMAL HIGH (ref 0–35)
ANION GAP: 16 — AB (ref 5–15)
AST: 80 U/L — ABNORMAL HIGH (ref 0–37)
BILIRUBIN TOTAL: 1 mg/dL (ref 0.3–1.2)
BUN: 30 mg/dL — AB (ref 6–23)
CHLORIDE: 99 meq/L (ref 96–112)
CO2: 19 meq/L (ref 19–32)
Calcium: 9.4 mg/dL (ref 8.4–10.5)
Creatinine, Ser: 1.54 mg/dL — ABNORMAL HIGH (ref 0.50–1.10)
GFR calc Af Amer: 44 mL/min — ABNORMAL LOW (ref >90)
GFR, EST NON AFRICAN AMERICAN: 38 mL/min — AB (ref >90)
Glucose, Bld: 106 mg/dL — ABNORMAL HIGH (ref 70–99)
POTASSIUM: 4.4 meq/L (ref 3.7–5.3)
Sodium: 134 mEq/L — ABNORMAL LOW (ref 137–147)
Total Protein: 6.7 g/dL (ref 6.0–8.3)

## 2014-07-09 MED ORDER — ALPRAZOLAM 0.25 MG PO TABS
0.2500 mg | ORAL_TABLET | Freq: Three times a day (TID) | ORAL | Status: DC | PRN
  Administered 2014-07-12 – 2014-07-25 (×9): 0.25 mg via ORAL
  Filled 2014-07-09 (×9): qty 1

## 2014-07-09 MED ORDER — NITROGLYCERIN 0.4 MG SL SUBL
0.4000 mg | SUBLINGUAL_TABLET | SUBLINGUAL | Status: DC | PRN
  Administered 2014-07-09 (×3): 0.4 mg via SUBLINGUAL
  Filled 2014-07-09 (×3): qty 1

## 2014-07-09 MED ORDER — ALPRAZOLAM 0.5 MG PO TABS
0.5000 mg | ORAL_TABLET | Freq: Three times a day (TID) | ORAL | Status: DC | PRN
  Administered 2014-07-09: 0.5 mg via ORAL
  Filled 2014-07-09: qty 1

## 2014-07-09 NOTE — Progress Notes (Signed)
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 Tyrone Sage.  HPI: Charlotte [name] is a 48 y.o. female with history of HTN, CKD, polysubstance abuse in the past, bipolar disorder; who was admitted on 06/29/14 with sudden onset of severe chest pain with SOB and inability to move BLE. Patient noted to have A fib at admission. 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 Dr. Roseanne Reno recommended spinal drain for acute management as well as MRI of thoracic and lumbar spine recommended for work up. Spinal drain placed with improvement in LE strength. On nicardipine and labetalol drips for BP management. 2 D echo with EF 60-65% with grade on diastolic dysfunction. Drain removed today and therapy evaluations to be done. Chest pain improved and no plans for surgery? Patient will likely require prolonged rehab due to paraparesis and CIR recommended by MD.  Review of Systems  HENT: Negative for hearing loss.  Eyes: Negative for blurred vision and double vision.  Respiratory: Negative for cough and shortness of breath.  Cardiovascular: Negative for chest pain and palpitations.  Gastrointestinal: Positive for constipation. Negative for heartburn, nausea and abdominal pain.  Musculoskeletal: Negative for back pain, myalgias and neck pain.  Neurological: Positive for sensory change, focal weakness, weakness and headaches. Negative for dizziness and tingling.   Past Medical History   Diagnosis  Date   .  Hypertension    .  Renal disorder     Past Surgical History   Procedure  Laterality  Date   .  Cesarean section       No family history on file.  Social History: Lives alone in 2 nd floor apartment. Has two daughters in town. She reports that she has been smoking Cigarettes. She has been smoking about 1-2 PPD. She has never used smokeless tobacco. Has history of alcohol abuse and cocaine use in the past. .  Allergies   Allergen  Reactions   .  Seroquel [Quetiapine]  Other (See Comments)     Night sweats, dizziness and possible confusion   .  Olanzapine  Nausea And Vomiting    Medications Prior to Admission   Medication  Sig  Dispense  Refill   .  amLODipine (NORVASC) 10 MG tablet  Take 10 mg by mouth daily.     [name]  aspirin-acetaminophen-caffeine (EXCEDRIN MIGRAINE) 250-250-65 MG per tablet  Take 1 tablet by mouth every 6 (six) hours as needed for headache.     .  lisinopril-hydrochlorothiazide (PRINZIDE,ZESTORETIC) 20-12.5 MG per tablet  Take 1 tablet by mouth daily.     .  naproxen sodium (ANAPROX) 220 MG tablet  Take 220 mg by mouth daily as needed (for pain).     [name]  OLANZapine (ZYPREXA) 5 MG tablet  Take 5 mg by mouth at bedtime.     [name]  QUEtiapine Fumarate (SEROQUEL PO)  Take 1 tablet by mouth at bedtime.     .  Sertraline HCl (ZOLOFT PO)  Take 1 tablet by mouth daily.      Home:   Functional History:   Functional Status:  Mobility:      ADL:   Cognition:  Cognition  Orientation Level: Oriented  X4   Blood pressure 125/64, pulse 70, temperature 98.3 F (36.8 C), temperature source Oral, resp. rate 23, height 5\' 5"  (1.651 m), weight 67.7 kg (149 lb 4 oz), SpO2 96.00%.  Physical Exam  Nursing note and vitals reviewed.  Constitutional: [name], place, and time. She appears well-developed and well-nourished.  HENT:  Head: Normocephalic and atraumatic.  Eyes: Conjunctivae are normal. Pupils are equal, round, and reactive to light.  Neck: Normal range of motion. Neck supple.  Cardiovascular: Regular rhythm.  Respiratory: Effort normal and breath sounds normal. No  respiratory distress. She has no wheezes.  GI: Soft. Bowel sounds are normal. She exhibits no distension. There is no tenderness.  Musculoskeletal: She exhibits no edema.  Neurological: She is alert and oriented to person, place, and time.  LLE>RLE weakness. Able to activate quads. 2+/5 PF/DF.  Skin: Skin is warm and dry.  Psychiatric: Her speech is normal. Thought content normal. Her affect is blunt. She is withdrawn.  upper extremities strength 4/5 deltoid, bicep, tricep, grip  Patient is lethargic but aroused to voice  Lower extremity strength 3 minus right hip flexor knee extensor 2 minus left hip flexor knee extensor  Results for orders placed during the hospital encounter of 06/29/14 (from the past 24 hour(s))   GLUCOSE, CAPILLARY Status: None    Collection Time    07/01/14 11:52 AM   Result  Value  Ref Range    Glucose-Capillary  99  70 - 99 mg/dL    Comment 1  Documented in Chart     Comment 2  Notify RN    GLUCOSE, CAPILLARY Status: Abnormal    Collection Time    07/01/14 3:49 PM   Result  Value  Ref Range    Glucose-Capillary  114 (*)  70 - 99 mg/dL    Comment 1  Notify RN    GLUCOSE, CAPILLARY Status: Abnormal    Collection Time    07/01/14 8:08 PM   Result  Value  Ref Range    Glucose-Capillary  134 (*)  70 - 99 mg/dL    Comment 1  Notify RN    GLUCOSE, CAPILLARY Status: Abnormal    Collection Time    07/01/14 11:49 PM   Result  Value  Ref Range    Glucose-Capillary  102 (*)  70 - 99 mg/dL   BASIC METABOLIC PANEL Status: Abnormal    Collection Time    07/02/14 3:05 AM   Result  Value  Ref Range    Sodium  138  137 - 147 mEq/L    Potassium  3.9  3.7 - 5.3 mEq/L    Chloride  105  96 - 112 mEq/L    CO2  20  19 - 32 mEq/L    Glucose, Bld  135 (*)  70 - 99 mg/dL    BUN  22  6 - 23 mg/dL    Creatinine, Ser  1.61 (*)  0.50 - 1.10 mg/dL    Calcium  8.6  8.4 - 10.5 mg/dL    GFR calc non Af Amer  40 (*)  >90 mL/min    GFR calc Af Amer  46 (*)  >90 mL/min    Anion  gap  13  5 - 15   CBC Status: None    Collection Time    07/02/14 3:05 AM   Result  Value  Ref Range    WBC  8.5  4.0 - 10.5 K/uL  RBC  4.41  3.87 - 5.11 MIL/uL    Hemoglobin  12.2  12.0 - 15.0 g/dL    HCT  16.137.7  09.636.0 - 04.546.0 %    MCV  85.5  78.0 - 100.0 fL    MCH  27.7  26.0 - 34.0 pg    MCHC  32.4  30.0 - 36.0 g/dL    RDW  40.915.0  81.111.5 - 91.415.5 %    Platelets  173  150 - 400 K/uL   GLUCOSE, CAPILLARY Status: Abnormal    Collection Time    07/02/14 3:50 AM   Result  Value  Ref Range    Glucose-Capillary  140 (*)  70 - 99 mg/dL   GLUCOSE, CAPILLARY Status: Abnormal    Collection Time    07/02/14 8:15 AM   Result  Value  Ref Range    Glucose-Capillary  104 (*)  70 - 99 mg/dL    Comment 1  Notify RN     Comment 2  Documented in Chart     No results found.  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 Does the need for close, 24 hr/day medical supervision in concert with the patient's rehab needs make it unreasonable for this patient to be served in a less intensive setting? Yes 2. Co-Morbidities requiring supervision/potential complications: Polycystic kidney disease, history of polysubstance abuse 3. Due to bladder management, bowel management, safety, skin/wound care, disease management, medication administration, pain management and patient education, does the patient require 24 hr/day rehab nursing? Yes 4. Does the patient require coordinated care of a physician, rehab nurse, PT (1-2 hrs/day, 5 days/week) and OT (1-2 hrs/day, 5 days/week) to address physical and functional deficits in the context of the above medical diagnosis(es)? Yes Addressing deficits in the following areas: balance, endurance, locomotion, strength, transferring, bowel/bladder control, bathing, dressing and toileting 5. Can the patient actively participate in an intensive therapy program of at least 3 hrs of therapy per day at least 5  days per week? Yes and Potentially 6. The potential for patient to make measurable gains while on inpatient rehab is good 7. Anticipated functional outcomes upon discharge from inpatient rehab are min assist with PT, min assist with OT, n/a with SLP. 8. Estimated rehab length of stay to reach the above functional goals is: 15-18days 9. Does the patient have adequate social supports to accommodate these discharge functional goals? Yes 10. Anticipated D/C setting: Home 11. Anticipated post D/C treatments: Home excercise program 12. Overall Rehab/Functional Prognosis: good RECOMMENDATIONS:  This patient's condition is appropriate for continued rehabilitative care in the following setting: CIR  Patient has agreed to participate in recommended program. Potentially  Note that insurance prior authorization may be required for reimbursement for recommended care.  Comment: daughters young but appear supportive, has steps to enter home  07/02/2014  Revision History...      Date/Time User Action    07/03/2014 9:35 AM Erick ColaceAndrew E Kirsteins, MD Sign    07/02/2014 12:48 PM Jacquelynn CreePamela S Love, PA-C Share   View Details Report    Routing History.[name]..[name]

## 2014-07-09 NOTE — Progress Notes (Signed)
Patient information reviewed and entered into [facility] System by Becky Asahel Risden, covering PPS coordinator. Information including medical coding and functional independence measure will be reviewed and updated through discharge.   

## 2014-07-09 NOTE — Progress Notes (Addendum)
Occupational Therapy Assessment and Plan  Patient Details  Name: Charlotte [name] MRN: [id number] Date of Birth: 19-Feb-1966  OT Diagnosis: acute pain and paraparesis Rehab Potential: Rehab Potential: Good ELOS: 2-3 wks   Today's Date: 07/09/2014 OT Individual Time: 2353-6144 OT Individual Time Calculation (min): 63 min    Problem List:  Patient Active Problem List   Diagnosis Date Noted  . 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 Polycystic kidney disease 06/29/2014  . Aortic dissection distal to left subclavian 06/29/2014  . 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 Acute infarction of spinal cord 06/29/2014  . 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 Aortic dissection 06/29/2014  . ANEMIA 02/09/2011  . TOBACCO ABUSE 02/09/2011  . COCAINE ABUSE 02/09/2011  . HYPERTENSION, ESSENTIAL 02/09/2011  . NAUSEA 02/09/2011    Past Medical History:  Past Medical History  Diagnosis Date  . Hypertension   . Family history of anesthesia complication     "alot of back pains from the epidurals"  . Complication of anesthesia     "alot of back pains from the epidurals"  . Anemia   . Depression   . GERD (gastroesophageal reflux disease)   . Daily headache   . Migraine     "sometimes twice/day" (07/05/2014)  . Stroke 06/29/2014    residual BLE weakness  . Dissecting aneurysm of thoracic aorta, Stanford type B 06/29/2014    Archie Endo 07/05/2014  . Polycystic kidney disease     /notes 07/05/2014  . 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 Arthritis     "shoulders" (07/05/2014)  . Bipolar disorder   . Anxiety    Past Surgical History:  Past Surgical History  Procedure Laterality Date  . Cesarean section  1986  . Tubal ligation  1986  . Spinal drain placement  06/29/2014  . Spinal drain removal  07/02/2014  . Thoracentesis  07/05/2014    Archie Endo 07/05/2014     Assessment & Plan Clinical Impression: Patient is a 48 y.o. year old female with history of HTN, CKD, polysubstance abuse in the past, bipolar disorder; who was admitted on 06/29/14 with sudden onset of severe chest pain with SOB and inability to move BLE. Patient noted to have A fib at admission. 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 Dr. Nicole Kindred recommended spinal drain for acute management as well as MRI of thoracic and lumbar spine recommended for work up. Spinal drain placed with improvement in LE strength. On nicardipine and labetalol drips for BP management. 2 D echo with EF 60-65% with grade on diastolic dysfunction. Drain removed today and therapy evaluations to be done. Chest pain improved and no plans for surgery? Patient will likely require prolonged rehab due to paraparesis and CIR recommended by MD.  Patient transferred to CIR on 07/08/2014 .    Patient currently requires max with basic self-care skills secondary to muscle weakness.  Prior to hospitalization, patient could complete BADL and iADL independenlty.   Patient will benefit from skilled intervention to increase level of independence with BADL prior to discharge home with care partner.  Anticipate patient will require minimal physical assistance and follow up home health.  OT - End of Session Activity Tolerance: Tolerates 10 - 20 min activity with multiple rests Endurance Deficit: Yes OT Assessment Rehab Potential: Good OT Patient demonstrates impairments in the following area(s): Behavior;Endurance;Pain;Safety;Motor;Balance OT Basic ADL's Functional Problem(s):  Bathing;Dressing;Toileting OT Transfers Functional Problem(s): Toilet;Tub/Shower OT Plan OT Intensity: Minimum of 1-2 x/day, 45 to 90 minutes OT Frequency: 5 out of 7 days OT Duration/Estimated Length of Stay: 2-3 wks OT Treatment/Interventions: Training and development officer;Therapeutic Activities;Therapeutic Exercise;UE/LE Strength taining/ROM;Wheelchair propulsion/positioning;Self Care/advanced ADL retraining;Pain management;Patient/family Biomedical scientist;Discharge planning OT Self Feeding Anticipated Outcome(s): Supervision OT Basic Self-Care Anticipated Outcome(s): Min A OT Toileting Anticipated Outcome(s): Supervision OT Bathroom Transfers Anticipated Outcome(s): Supervision OT Recommendation Recommendations for Other Services: Neuropsych consult Patient destination: Home Follow Up Recommendations: Home health OT Equipment Recommended: To be determined   Skilled Therapeutic Intervention 1:1 OT initial evaluation completed with treatment provided with focus on endurance, sitting balance, transfers, and pain management.   Pt performed assisted bed mobility, transfer to/from w/c to/from bed and BSC, toileted and completed assisted bath (supine for lower body, sitting in w/c at sink for upper) but required frequent rest breaks and manual facilitation to place legs during initial session.   Endurance was poor and pain management was problematic but manageable with extra time provided.  OT Evaluation Precautions/Restrictions  Precautions Precautions: Fall Restrictions Weight Bearing Restrictions: No  General Chart Reviewed: Yes Family/Caregiver Present: No  Vital Signs Therapy Vitals Pulse Rate: 64 BP: 136/66 mmHg Patient Position (if appropriate): Sitting Oxygen Therapy SpO2: 97 % O2 Device: Nasal cannula O2 Flow Rate (L/min): 2 L/min  Pain Pain Assessment Pain Assessment: 0-10 Pain Score: 8  Pain Type: Acute pain Pain Location: Chest Pain  Orientation: Upper Pain Descriptors / Indicators: Discomfort Pain Intervention(s): RN made aware;Repositioned  Home Living/Prior Functioning Home Living Available Help at Discharge: Family;Available 24 hours/day (dtr doesn't work per t) Type of Home: House Home Access: Stairs to enter Technical brewer of Steps: 1 Entrance Stairs-Rails: None Home Layout: One level Additional Comments: Plan to stay with daughter Caryl Pina at [place] home post discharge  Lives With: Alone (going to stay w/ dtr at d/c) IADL History Homemaking Responsibilities: Yes Meal Prep Responsibility: Primary Laundry Responsibility: Primary Cleaning Responsibility: Primary Bill Paying/Finance Responsibility: Primary Shopping Responsibility: Primary Child Care Responsibility: Primary Prior Function Level of Independence: Independent with basic ADLs  ADL ADL ADL Comments: see FIM  Vision/Perception  Vision- History Baseline Vision/History: No visual deficits Patient Visual Report: No change from baseline Vision- Assessment Vision Assessment?: No apparent visual deficits Perception Comments: WFL   Cognition Arousal/Alertness: Lethargic Orientation Level: Oriented X4 Attention: Alternating Alternating Attention: Appears intact Memory: Appears intact Awareness: Appears intact Problem Solving: Impaired Problem Solving Impairment: Functional complex Safety/Judgment: Appears intact  Sensation Sensation Light Touch: Appears Intact Stereognosis: Appears Intact Hot/Cold: Appears Intact Proprioception: Appears Intact Additional Comments: WFL @ BUE, impared BLE Coordination Gross Motor Movements are Fluid and Coordinated: Yes Fine Motor Movements are Fluid and Coordinated: Yes  Motor  Motor Motor: Paraplegia Motor - Skilled Clinical Observations: paraparesis, BLE  Mobility  Bed Mobility Bed Mobility: Rolling Right;Rolling Left;Sitting - Scoot to Edge of Bed;Sit to Supine;Supine to  [employer] Right: 3: Mod assist Rolling Right Details: Manual facilitation for placement;Verbal cues for safe use of DME/AE;Verbal cues for technique Rolling Left: 3: Mod assist Rolling Left Details: Manual facilitation for placement;Verbal cues for safe use of DME/AE;Verbal cues for technique Supine to Sit: 2: Max  assist Sitting - Scoot to Edge of Bed: 3: Mod assist Sitting - Scoot to Edge of Bed Details: Manual facilitation for placement;Verbal cues for technique Sit to Supine: 2: Max assist Sit to Supine - Details: Verbal cues for technique;Manual facilitation for placement   Trunk/Postural Assessment  Cervical Assessment Cervical Assessment: Within Functional Limits Thoracic Assessment Thoracic Assessment: Within Functional Limits Lumbar Assessment Lumbar Assessment: Within Functional Limits Postural Control Postural Control: Within Functional Limits   Balance Balance Balance Assessed: No  Extremity/Trunk Assessment RUE Assessment RUE Assessment: Within Functional Limits LUE Assessment LUE Assessment: Within Functional Limits  FIM:  FIM - Grooming Grooming Steps: Wash, rinse, dry face;Wash, rinse, dry hands Grooming: 3: Patient completes 2 of 4 or 3 of 5 steps FIM - Bathing Bathing Steps Patient Completed: Chest;Right Arm;Left Arm;Abdomen;Front perineal area;Right upper leg;Left upper leg Bathing: 3: Mod-Patient completes 5-7 [id number] parts or 50-74% FIM - Upper Body Dressing/Undressing Upper body dressing/undressing: 0: Wears gown/pajamas-no public clothing FIM - Lower Body Dressing/Undressing Lower body dressing/undressing: 0: Wears gInterior and spatial designerFIM - Toileting Toileting: 1: Total-Patient completed zero steps, helper did all 3 FIM - BControl and instrumentation engineerDevices: Bed rails;HOB elevated Bed/Chair Transfer: 3: Supine > Sit: Mod A (lifting assist/Pt. 50-74%/lift 2 legs;3: Sit > Supine: Mod A (lifting assist/Pt. 50-74%/lift 2  legs);3: Bed > Chair or W/C: Mod A (lift or lower assist);3: Chair or W/C > Bed: Mod A (lift or lower assist) FIM - TRadio producerDevices: Bedside commode Toilet Transfers: 3-To toilet/BSC: Mod A (lift or lower assist);3-From toilet/BSC: Mod A (lift or lower assist)   Refer to Care Plan for Long Term Goals  Recommendations for other services: Neuropsych  Discharge Criteria: Patient will be discharged from OT if patient refuses treatment 3 consecutive times without medical reason, if treatment goals not met, if there is a change in medical status, if patient makes no progress towards goals or if patient is discharged from hospital.  The above assessment, treatment plan, treatment alternatives and goals were discussed and mutually agreed upon: by patient and by family  Second session: Time: 1300-1338 Time Calculation (min):  38 min  Pain Assessment: 8/10, chest pain  Skilled Therapeutic Interventions: ADL-retraining with focus on improved sitting tolerance, self-feeding skills, transfers, bed mobility and family ed on pt's status.   With pt's mother, sister, and brother-in-law present, pt participated in bed mobility, transfer to w/c, and self-feeding with moderate physical assist and maximum verbal cues to re-motivate and distract from moderate chronic discomfort resulting in immobility.   Pt performed roll to left and rose from supine to sitting at EOB with mod assist to lift left leg, no assist required for right leg this session.   Pt then performed a modified squat-pivot/lateral transfer from bed to w/c with facilitation to place feet and weight-shift posterior>anterior.   Pt requires copious cues to slow down and attend to directions but complied with exhortation from her mother (family matriarch) present.  Pt sat at w/c for approx 10 minutes to consume lunch provided by her mother after rejecting facility lunch served.   Pt then reported acute pain and immediate  need for transfer back to bed which she accomplished similar to transfer out of bed.   Session terminated early d/t fatigue.  Pt left in room with family still present and call light within reach.   See FIM for current functional status  Therapy/Group: Individual Therapy  BPerryton8/18/2015, 2:10 PM

## 2014-07-09 NOTE — Evaluation (Addendum)
Physical Therapy Assessment and Plan  Patient Details  Name: Charlotte [name] MRN: [id number] Date of Birth: [birth year]  PT Diagnosis: Muscle weakness, Paraplegia and Pain in chest Rehab Potential: Fair ELOS: 2-3 weeks   Today's Date: 07/09/2014 PT Individual Time: 0900-0930 PT Individual Time Calculation (min): 30 min    Problem List:  Patient Active Problem List   Diagnosis Date Noted  . 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 Polycystic kidney disease 06/29/2014  . Aortic dissection distal to left subclavian 06/29/2014  . 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 Acute infarction of spinal cord 06/29/2014  . 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 Aortic dissection 06/29/2014  . ANEMIA 02/09/2011  . TOBACCO ABUSE 02/09/2011  . COCAINE ABUSE 02/09/2011  . HYPERTENSION, ESSENTIAL 02/09/2011  . NAUSEA 02/09/2011    Past Medical History:  Past Medical History  Diagnosis Date  . Hypertension   . Family history of anesthesia complication     "alot of back pains from the epidurals"  . Complication of anesthesia     "alot of back pains from the epidurals"  . Anemia   . Depression   . GERD (gastroesophageal reflux disease)   . Daily headache   . Migraine     "sometimes twice/day" (07/05/2014)  . Stroke 06/29/2014    residual BLE weakness  . Dissecting aneurysm of thoracic aorta, Stanford type B 06/29/2014    Archie Endo 07/05/2014  . Polycystic kidney disease     /notes 07/05/2014  . 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 Arthritis     "shoulders" (07/05/2014)  . Bipolar disorder   . Anxiety    Past Surgical History:  Past Surgical History  Procedure Laterality Date  . Cesarean section  1986  . Tubal ligation  1986  . Spinal drain placement  06/29/2014  . Spinal drain removal  07/02/2014  . Thoracentesis  07/05/2014    Archie Endo 07/05/2014     Assessment & Plan Clinical Impression: Charlotte [name] is a 48 y.o. female with history of HTN, CKD, polysubstance abuse in the past, bipolar disorder; who was admitted on 06/29/14 with sudden onset of severe chest pain with SOB and inability to move BLE. Patient noted to have A fib at admission. 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 Dr. Nicole Kindred recommended spinal drain for acute management as well as MRI of thoracic and lumbar spine recommended for work up. Spinal drain placed with improvement in LE strength. On nicardipine and labetalol drips for BP management. 2 D echo with EF 60-65% with grade on diastolic dysfunction.  Chest pain improved and no plans for surgery. She developed chest wall pain with SOB on 08/13 and CTA chest with evolving descending thoracic aortic intramural hematoma and development of moderate to large left pleural effusion and small right pleural effusion. Left effusion tapped for 300 cc by IVR with improvement in symptoms. Therapy ongoing with improvement in tolerance at EOB and unable to stand due to BLE instability. Patient will require prolonged rehab due to paraparesis. CIR recommended by MD and therapy team  therefore patient admitted 07/08/2014.    Patient currently requires mod with bed mobility and transfers per OT (pt refused functional assessment w/ PT 2/2 chest pain). Pt limited by paraplegia in BLE (L>R), pain, impaired sensation.  Prior to hospitalization, patient was modified independent  with mobility and lived with Alone (going to stay w/ dtr at d/c) in a House home.  Home access is 1Stairs to enter. Goals set for mod (I) from w/c  level w/ S for transfers and for short distance ambulation. Goals may be modified pending progress and activity tolerance.  Patient will benefit from skilled PT intervention to maximize safe functional mobility, minimize fall risk and decrease caregiver burden for planned discharge home with 24 hour supervision.  Anticipate patient will benefit from follow up [place] [facility] at discharge.  PT - End of Session Endurance Deficit: Yes  Skilled Therapeutic Intervention None - pt refused functional assessment and therapeutic intervention from PT 2/2 acute chest pain.  PT Evaluation Precautions/Restrictions Precautions Precautions: Fall Restrictions Weight Bearing Restrictions: No General Chart Reviewed: Yes Family/Caregiver Present: No Vital SignsTherapy Vitals Pulse Rate: 64 BP: 136/66 mmHg Patient Position (if appropriate): Sitting Oxygen Therapy SpO2: 97 % O2 Device: Nasal cannula O2 Flow Rate (L/min): 2 L/min Pain Pain Assessment Pain Assessment: 0-10 Pain Score: 8  Pain Type: Acute pain Pain Location: Chest Pain Orientation: Upper Pain Descriptors / Indicators: Discomfort Pain Onset: On-going Pain Intervention(s): RN made aware;Rest Home Living/Prior Functioning Home Living Available Help at Discharge: Family;Available 24 hours/day (dtr doesn't work per t) Type of Home: House Home Access: Stairs to enter Technical brewer of Steps: 1 Entrance Stairs-Rails: None Home Layout: One level Additional Comments: Plan to stay with daughter Caryl Pina at [place] home post discharge  Lives With: Alone (going to stay w/ dtr at d/c) Prior Function Level of Independence: Independent with basic ADLs Vision/Perception  Perception Comments: WFL  Cognition Overall Cognitive Status: Difficult to assess Arousal/Alertness: Lethargic Orientation Level:  (Impaired 2/2 chest pain) Attention: Alternating Alternating Attention: Appears intact Memory: Appears intact Awareness: Appears  intact Problem Solving: Impaired Problem Solving Impairment: Functional complex Safety/Judgment: Appears intact Sensation Sensation Light Touch: Impaired by gross assessment Stereognosis: Appears Intact Hot/Cold: Appears Intact Proprioception: Appears Intact Additional Comments: Grossly impaired BLE Coordination Gross Motor Movements are Fluid and Coordinated: Yes Fine Motor Movements are Fluid and Coordinated: Yes Motor  Motor Motor: Paraplegia Motor - Skilled Clinical Observations: paraparesis, BLE  Mobility Bed Mobility Bed Mobility: Rolling Right Rolling Right: 3: Mod assist Rolling Right Details: Manual facilitation for placement;Verbal cues for safe use of DME/AE;Verbal cues for technique Rolling Left: 3: Mod assist Rolling Left Details: Manual facilitation for placement;Verbal cues for safe use of DME/AE;Verbal cues for technique Supine to Sit: 2: Max assist Sitting - Scoot to Edge of Bed: 3: Mod assist Sitting - Scoot to [employer] of Bed Details: Manual facilitation for placement;Verbal cues for technique Sit to Supine: 2: Max assist Sit to Supine - Details: Verbal cues for technique;Manual facilitation for placement Locomotion  Ambulation Ambulation: No Gait Gait: No  Trunk/Postural Assessment  Cervical Assessment Cervical Assessment: Within Functional Limits Thoracic Assessment Thoracic Assessment: Within Functional Limits Lumbar Assessment Lumbar Assessment: Within Functional Limits Postural Control Postural Control: Within Functional Limits  Balance Balance Balance Assessed: No Extremity Assessment  RUE Assessment RUE Assessment: Within Functional Limits LUE Assessment LUE Assessment: Within Functional Limits RLE Assessment RLE Assessment: Exceptions to [facility] RLE Strength RLE Overall Strength Comments: Grossly 3/5 during bed-level assessment, unable to asses functionally LLE Assessment LLE  Assessment: Exceptions to [facility] LLE Strength LLE Overall Strength  Comments: Grossly 2+/5 during bed-level assessment, unable to assess functionally  FIM:      Refer to Care Plan for Long Term Goals  Recommendations for other services: Neuropsych  Discharge Criteria: Patient will be discharged from PT if patient refuses treatment 3 consecutive times without medical reason, if treatment goals not met, if there is a change in medical status, if patient makes no progress towards goals or if patient is discharged from hospital.  The above assessment, treatment plan, treatment alternatives and goals were discussed and mutually agreed upon: by patient  Glennon Hamilton, PT, DPT 07/09/2014, 9:49 AM

## 2014-07-09 NOTE — Progress Notes (Signed)
[facility] RE[facility]ILITATION     PROGRESS NOTE    Subjective/Complaints: Called into room as patient awoke with chest pain this am. Reports some associated sob, VSS   Objective: Vital Signs: Blood pressure 130/61, pulse 75, temperature 98.7 F (37.1 C), temperature source Oral, resp. rate 18, height 5\' 5"  (1.651 m), weight 67.858 kg (149 lb 9.6 oz), SpO2 96.00%. Dg Abd 1 View  07/08/2014   CLINICAL DATA:  Constipation.  Spinal cord ischemia.  EXAM: ABDOMEN - 1 VIEW  COMPARISON:  CT of the abdomen 07/04/2014  FINDINGS: The bowel gas pattern is normal. No radio-opaque calculi or other significant radiographic abnormality are seen. There is moderate stool burden. Visualized osseous structures have a normal appearance.  IMPRESSION: Moderate stool burden.   Electronically Signed   By: Rosalie Gums M.D.   On: 07/08/2014 18:22   No results found for this basename: WBC, HGB, HCT, PLT,  in the last 72 hours  Recent Labs  07/07/14 0400 07/08/14 0340  NA 134* 135*  K 5.2 4.8  CL 100 101  GLUCOSE 115* 109*  BUN 32* 34*  CREATININE 1.60* 1.66*  CALCIUM 9.5 9.4   CBG (last 3)   Recent Labs  07/08/14 1618  GLUCAP 125*    Wt Readings from Last 3 Encounters:  07/08/14 67.858 kg (149 lb 9.6 oz)  07/07/14 66.588 kg (146 lb 12.8 oz)  02/09/11 59.648 kg (131 lb 8 oz)    Physical Exam:  Constitutional: [name], place, and time. She appears well-developed and well-nourished.  HENT: oral mucosa moist Head: Normocephalic and atraumatic.  Eyes: Conjunctivae are normal. Pupils are equal, round, and reactive to light.  Neck: Normal range of motion. Neck supple.  Cardiovascular: Regular rhythm and rate  Respiratory: Effort normal and breath sounds normal. No respiratory distress. She has no wheezes. No rales GI: Soft. Bowel sounds are normal. She exhibits no distension. There is no tenderness.  Musculoskeletal: She exhibits no edema.  Neurological: She is  alert and oriented to person, place, and time.  LLE>RLE weakness. 1-2/5 with HF/KE (inconsistent effort) 2+/5 PF/DF.  Upper extremities strength 4+/5 deltoid, bicep, tricep, grip  Diminished sensation to PP/LT from breasts distally, 1+/2  Skin: Skin is warm and dry.  Psychiatric: anxious   Assessment/Plan: 1. 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 Physiatrist is providing close team supervision and 24 hour management of active medical problems listed below. Physiatrist and rehab team continue to assess barriers to discharge/monitor patient progress toward functional and medical goals. FIM:                   Comprehension Comprehension Mode: Auditory Comprehension: 5-Follows basic conversation/direction: With no assist  Expression Expression Mode: Verbal Expression: 5-Expresses basic needs/ideas: With extra time/assistive device  Social Interaction Social Interaction: 5-Interacts appropriately 90% of the time - Needs monitoring or encouragement for participation or interaction.  Problem Solving Problem Solving: 5-Solves basic problems: With no assist  Memory Memory: 5-Recognizes or recalls 90% of the time/requires cueing < 10% of the time  Medical Problem List and Plan:  1. 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 DVT Prophylaxis/Anticoagulation: Mechanical: Antiembolism stockings, knee (TED hose) Bilateral lower extremities  Sequential compression devices, below knee Bilateral lower extremitie  3. Pain Management: Will increase oxycodone  to 15 mg and discontinue prn dilaudid. Will add  4. Mood: LCSW to follow for evaluation and support.  5. Neuropsych: This patient is capable of making decisions on her own behalf.  6.  Skin/Wound Care: Turn patient every 2 hours. Pressure relief measures. Maintain adequate hydration and nutritional status.  7. Hyponatremia: Check follow up labs in am.  8. Acute renal failure: push po fluids. Monitor for orthostasis.  9. Constipation: +BM this am 10. Chest pain---EKG now, SL nitro, labs pending, cxr  --has hx of chest pain due to hematoma,effusions prior to transfer to rehab.   LOS (Days) 1 A FACE TO FACE EVALUATION WAS PERFORMED  Danniella Robben T 07/09/2014 7:02 AM

## 2014-07-09 NOTE — Progress Notes (Signed)
PMR Admission Coordinator Pre-Admission Assessment  Patient: Charlotte [name] is an 48 y.o., female  MRN: [id number]  DOB: [birth year]  Height: 5\' 5"  (165.1 cm)  Weight: 66.588 kg (146 lb 12.8 oz)  Insurance Information  Self pay with medicaid potential  Medicaid Application Date: Case Manager:  Disability Application Date: Case Worker:  Emergency Patent examiner Information    Name  Relation  Home  Work  Mobile    Lucero,Crystal  Daughter    [phone]    Bourget,[name]  Daughter  [phone]   [phone]      Current Medical History  Patient Admitting Diagnosis: Spinal cord infarct  History of Present Illness: A 48 y.o. female with history of HTN, CKD, polysubstance abuse in the past, bipolar disorder; who was admitted on 06/29/14 with sudden onset of severe chest pain with SOB and inability to move BLE. Patient noted to have A fib at admission. 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 Dr. Roseanne Reno recommended spinal drain for acute management as well as MRI of thoracic and lumbar spine recommended for work up. Spinal drain placed with improvement in LE strength. On nicardipine and labetalol drips for BP management. 2 D echo with EF 60-65% with grade on diastolic dysfunction. Drain removed today and therapy evaluations to be done. Chest pain improved and no plans for surgery? Patient will likely require prolonged rehab due to paraparesis and CIR recommended by MD.  Past Medical History  Past Medical History   Diagnosis  Date   .  Hypertension    .  Family history of anesthesia complication      "alot of back pains from the epidurals"   .   Complication of anesthesia      "alot of back pains from the epidurals"   .  Anemia    .  Depression    .  GERD (gastroesophageal reflux disease)    .  Daily headache    .  Migraine      "sometimes twice/day" (07/05/2014)   .  Stroke  06/29/2014     residual BLE weakness   .  Dissecting aneurysm of thoracic aorta, Stanford type B  06/29/2014     [name] 07/05/2014   .  Polycystic kidney disease      /notes 07/05/2014   .  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  Arthritis      "shoulders" (07/05/2014)   .  Bipolar disorder    .  Anxiety     Family History  family history is not on file.  Prior Rehab/Hospitalizations: None  Current Medications  Current facility-administered medications:acetaminophen (TYLENOL) tablet 650 mg, 650 mg, Oral, Q4H PRN, Vishal Mungal, MD, 650 mg at 07/07/14 2341; albuterol (PROVENTIL) (2.5 MG/3ML) 0.083% nebulizer solution 3 mL, 3 mL, Inhalation, Q2H PRN, Wadie Lessen, MD; alum & mag hydroxide-simeth (MAALOX/MYLANTA) 200-200-20 MG/5ML suspension 15-30 mL, 15-30 mL, Oral, Q2H PRN, Delight Ovens, MD, 30 mL at 07/05/14 2000  antiseptic oral rinse (CPC / CETYLPYRIDINIUM CHLORIDE 0.05%) solution 7 mL, 7 mL, Mouth Rinse, BID, Nelda Bucks, MD, 7 mL at 07/08/14 0843; docusate sodium (COLACE) capsule  100 mg, 100 mg, Oral, Daily, Delight OvensEdward B Gerhardt, MD, 100 mg at 07/08/14 [phone]; feeding supplement (ENSURE COMPLETE) (ENSURE COMPLETE) liquid 237 mL, 237 mL, Oral, TID BM, Heather Cornelison Pitts, RD, 237 mL at 07/07/14 1400  hydrALAZINE (APRESOLINE) injection 20 mg, 20 mg, Intravenous, Q4H PRN, Nelda Bucksaniel J Feinstein, MD, 20 mg at 07/07/14 0500; hydrALAZINE (APRESOLINE) tablet 75 mg, 75 mg, Oral, 3 times per day, Joseph ArtJessica U Vann, DO, 75 mg at 07/08/14 [id number]; HYDROmorphone (DILAUDID) injection 1 mg, 1 mg, Intravenous, Q3H PRN, Joseph ArtJessica U Vann, DO, 1 mg at 07/08/14 1119  labetalol (NORMODYNE) tablet 300 mg, 300 mg, Oral, BID,  Lonia FarberKonstantin Zubelevitskiy, MD, 300 mg at 07/08/14 1119; ondansetron [facility](ZOFRAN) injection 4 mg, 4 mg, Intravenous, Q6H PRN, Delight OvensEdward B Gerhardt, MD, 4 mg at 07/07/14 1349; oxyCODONE (Oxy IR/ROXICODONE) immediate release tablet 5-10 mg, 5-10 mg, Oral, Q6H PRN, Joseph ArtJessica U Vann, DO, 10 mg at 07/08/14 0843  pneumococcal 23 valent vaccine (PNU-IMMUNE) injection 0.5 mL, 0.5 mL, Intramuscular, Tomorrow-1000, Coralyn HellingVineet Sood, MD; sertraline (ZOLOFT) tablet 50 mg, 50 mg, Oral, Daily, Christiane Haorinna L Sullivan, MD; sodium chloride 0.9 % injection 3 mL, 3 mL, Intravenous, PRN, Joseph ArtJessica U Vann, DO, 3 mL at 07/07/14 1709; sodium chloride 0.9 % injection 3 mL, 3 mL, Intravenous, Q12H, Jessica U Vann, DO, 3 mL at 07/08/14 0845  traMADol (ULTRAM) tablet 50 mg, 50 mg, Oral, Q6H PRN, Joseph ArtJessica U Vann, DO, 50 mg at 07/08/14 1143  Patients Current Diet: Parke SimmersBland  Precautions / Restrictions  Precautions  Precautions: Fall  Restrictions  Weight Bearing Restrictions: No  Prior Activity Level  Community (5-7x/wk): Was active and went out daily.  Home Assistive Devices / Equipment  Home Assistive Devices/Equipment: None  Home Equipment: None  Prior Functional Level  Prior Function  Level of Independence: Independent with assistive device(s)  Current Functional Level  Cognition  Overall Cognitive Status: Impaired/Different from baseline  Orientation Level: Oriented X4  Safety/Judgement: Decreased awareness of safety;Decreased awareness of deficits  General Comments: Pt agitated iniitally and not wanting to get up stated her daughter would come today and take her home she didn't need therapy. Eventually she was able to calm down and participate however.   Extremity Assessment  (includes Sensation/Coordination)  Upper Extremity Assessment: Overall WFL for tasks assessed  Lower Extremity Assessment: Generalized weakness;RLE deficits/detail;LLE deficits/detail  RLE Deficits / Details: weakness with testing, 1-2/5 all motions  LLE Deficits /  Details: weakness with testing, 1-2/5 all motions   ADLs  Overall ADL's : Needs assistance/impaired  Eating/Feeding: Set up;Sitting  Grooming: Wash/dry hands;Wash/dry face;Set up;Sitting  Grooming Details (indicate cue type and reason): supported sitting  Upper Body Bathing: Supervision/ safety;Sitting  Upper Body Bathing Details (indicate cue type and reason): supported sitting  Lower Body Bathing: Total assistance;+2 for physical assistance;Sit to/from stand  Upper Body Dressing : Minimal assistance;Sitting  Lower Body Dressing: Sit to/from stand;+2 for physical assistance;Total assistance  Lower Body Dressing Details (indicate cue type and reason): Pt required assistance to cross the LLE over the right for donning her sock. She could not tolerate crossing the RLE over the left.  Toilet Transfer: +2 for physical assistance;Maximal assistance;BSC  Toileting- Clothing Manipulation and Hygiene: Total assistance;+2 for physical assistance;Sit to/from stand  Functional mobility during ADLs: +2 for physical assistance;Maximal assistance  General ADL Comments: Pt with limited endurance for selfcare tasks and needs +2 for sit to stand and toileting tasks. Decreased ability to support her weight with her LEs during standing or with transfer.   Mobility  Overal bed mobility: Needs Assistance  Bed Mobility: Supine to Sit  Rolling: Mod assist  Sidelying to sit: Min assist;+2 for physical assistance  Supine to sit: +2 for physical assistance;Mod assist  General bed mobility comments: Pt required assist to bring her trunk into sitting from sidelying.   Transfers  Overall transfer level: Needs assistance  Equipment used: (face to face transfer with gait pad and +2 assist)  Transfers: Sit to/from Stand;Stand Pivot Transfers  Sit to Stand: +2 physical assistance;Max assist  Stand pivot transfers: +2 physical assistance;Max assist  Squat pivot transfers: Max assist  General transfer comment: Pt unable to  advance her LEs during transfer with right being more impaired than the left.   Ambulation / Gait / Stairs / Wheelchair Mobility  Not able to ambulate at this time   Posture / Balance  Dynamic Sitting Balance  Sitting balance - Comments: Pt with posterior LOB when attempting to cross her LEs for dressing.   Special needs/care consideration  BiPAP/CPAP No  CPM No  Continuous Drip IV No  Dialysis No  Life Vest No  Oxygen On O2 at 2L [place] at this time  Special Bed No  Trach Size No  Wound Vac (area) No  Skin Dry skin  Bowel mgmt: Last BM 07/05/14  Bladder mgmt: Catheter has been removed. Urine is odorous, may have a UTI. Is voiding.  Diabetic mgmt No   Previous Home Environment  Living Arrangements: Children  Available Help at Discharge: Family  Type of Home: House  Home Layout: One level  Home Access: Stairs to enter  Entrance Stairs-Rails: None  Entrance Stairs-Number of Steps: 1  Home Care Services: No  Additional Comments: daughters at bedside, state that she will come stay with them post discharge  Discharge Living Setting  Plans for Discharge Living Setting: House;Lives with (comment) (Plans to go home with daughter, [name].)  Type of Home at Discharge: House  Discharge Home Layout: One level  Discharge Home Access: Level entry (Has small half step to porch entrance.)  Does the patient have any problems obtaining your medications?: Yes (Describe) ("money problems; have orange card; some places don't honor it")  Social/Family/Support Systems  Patient Roles: Parent (Has 2 daughters locally and 1 son with his father not local.)  Contact Information: Maelyn Berrey - daughter [phone]  Anticipated Caregiver: [name], daughter and family  Anticipated Caregiver's Contact Information: Kayleena Eke - daughter [phone]  Ability/Limitations of Caregiver: Dtr [name] is not working and can provide care.  Caregiver Availability: 24/7  Discharge Plan Discussed with Primary Caregiver:  Yes  Is Caregiver In Agreement with Plan?: Yes  Does Caregiver/Family have Issues with Lodging/Transportation while Pt is in Rehab?: No  Goals/Additional Needs  Patient/Family Goal for Rehab: PT/OT min assist goals  Expected length of stay: 15-18 days  Cultural Considerations: Christian  Dietary Needs: Soft diet, thin liquids  Equipment Needs: TBD  Additional Information: I spoke with sister and mom today. Mom tells me they will assist daughter, [name], to care for patient in [name]'s home.  Pt/Family Agrees to Admission and willing to participate: Yes  Program Orientation Provided & Reviewed with Pt/Caregiver Including Roles & Responsibilities: Yes  Decrease burden of Care through IP rehab admission: N/A  Possible need for SNF placement upon discharge: Not planned, but potentially if family unable to manage after rehab stay.  Patient Condition: This patient's medical and functional status has changed since the consult dated: 07/03/14 in which the Rehabilitation Physician determined and documented that the  patient's condition is appropriate for intensive rehabilitative care in an inpatient rehabilitation facility. See "History of Present Illness" (above) for medical update. Functional changes are: Currently requiring max assist +2 for stand pivot transfers. Patient's medical and functional status update has been discussed with the Rehabilitation physician and patient remains appropriate for inpatient rehabilitation. Will admit to inpatient rehab today.  Preadmission Screen Completed By: Trish Mage, 07/08/2014 12:46 PM  ______________________________________________________________________  Discussed status with Dr. Riley Kill on 07/08/14 at 1248 and received telephone approval for admission today.  Admission Coordinator: Trish Mage, time1248/Date08/17/15  Cosigned by: Ranelle Oyster, MD [07/08/2014 1:14 PM]

## 2014-07-10 ENCOUNTER — Inpatient Hospital Stay (HOSPITAL_COMMUNITY): Payer: Medicaid Other | Admitting: Physical Therapy

## 2014-07-10 ENCOUNTER — Inpatient Hospital Stay (HOSPITAL_COMMUNITY): Payer: Self-pay

## 2014-07-10 ENCOUNTER — Encounter (HOSPITAL_COMMUNITY): Payer: Self-pay | Admitting: Occupational Therapy

## 2014-07-10 DIAGNOSIS — G9519 Other vascular myelopathies: Secondary | ICD-10-CM

## 2014-07-10 DIAGNOSIS — I10 Essential (primary) hypertension: Secondary | ICD-10-CM

## 2014-07-10 DIAGNOSIS — G9782 Other postprocedural complications and disorders of nervous system: Secondary | ICD-10-CM

## 2014-07-10 DIAGNOSIS — M79609 Pain in unspecified limb: Secondary | ICD-10-CM

## 2014-07-10 DIAGNOSIS — M7989 Other specified soft tissue disorders: Secondary | ICD-10-CM

## 2014-07-10 DIAGNOSIS — F1410 Cocaine abuse, uncomplicated: Secondary | ICD-10-CM

## 2014-07-10 DIAGNOSIS — I7103 Dissection of thoracoabdominal aorta: Secondary | ICD-10-CM

## 2014-07-10 LAB — URINE CULTURE: Colony Count: >=100000

## 2014-07-10 MED ORDER — AMOXICILLIN 250 MG PO CAPS
250.0000 mg | ORAL_CAPSULE | Freq: Two times a day (BID) | ORAL | Status: AC
  Administered 2014-07-10 – 2014-07-14 (×10): 250 mg via ORAL
  Filled 2014-07-10 (×12): qty 1

## 2014-07-10 MED ORDER — AMOXICILLIN 250 MG PO CAPS: 250.0000 mg | ORAL_CAPSULE | Freq: Three times a day (TID) | ORAL | Status: DC

## 2014-07-10 NOTE — Progress Notes (Signed)
Patient ID: Charlotte [name], female   DOB: [birth year], 48 y.o.   MRN: [id number]      [address]       [place] [id number]             [phone]                      LOS: 2 days   Subjective: In rehab now. Overall appears more comfortable then last week. Still  Has vague mid anterior chest discomfort , hard for her describe. Second ct of chest done last week because of this discomfort.   Objective: Vital signs in last 24 hours: Patient Vitals for the past 24 hrs:  BP Temp Temp src Pulse Resp SpO2 Height Weight  07/10/14 1418 145/64 mmHg 98.5 F (36.9 C) Oral 65 18 92 % - -  07/10/14 1014 - - - - - - 5\' 5"  (1.651 m) 153 lb 6.4 oz (69.582 kg)  07/10/14 1010 152/76 mmHg - - 60 19 96 % - -  07/10/14 0959 - - - - - 94 % - -  07/10/14 0908 136/75 mmHg - - - - - - -  07/10/14 0841 142/69 mmHg - - 70 - 97 % - -  07/10/14 0757 - - - 74 - 92 % - -  07/10/14 [id number] 138/67 mmHg - - - - - - -  07/10/14 0506 - 98.7 F (37.1 C) Oral - 20 100 % - -  07/09/14 2317 132/76 mmHg - - - - - - -  07/09/14 2043 127/61 mmHg 98.8 F (37.1 C) Oral 72 20 100 % - -  07/09/14 1513 135/70 mmHg 98.5 F (36.9 C) Oral 66 18 96 % - -    Filed Weights   07/08/14 1700 07/10/14 1014  Weight: 149 lb 9.6 oz (67.858 kg) 153 lb 6.4 oz (69.582 kg)    Hemodynamic parameters for last 24 hours:    Intake/Output from previous day: 08/18 0701 - 08/19 0700 In: 340 [P.O.:340] Out: 100 [Urine:100] Intake/Output this shift: Total I/O In: 340 [P.O.:340] Out: 100 [Urine:100]  Scheduled Meds: . amoxicillin  250 mg Oral Q12H  . feeding supplement (ENSURE COMPLETE)  237 mL Oral TID BM  . gabapentin  100 mg Oral TID  . hydrALAZINE  75 mg Oral 3 times per day  . labetalol  300 mg Oral BID  . senna-docusate  2 tablet Oral QHS  . sertraline  50 mg Oral Daily   Continuous Infusions:  PRN Meds:.acetaminophen, albuterol, ALPRAZolam, alum & mag hydroxide-simeth, bisacodyl, guaiFENesin-dextromethorphan,  methocarbamol, oxyCODONE, prochlorperazine, prochlorperazine, prochlorperazine, traMADol, traZODone  General appearance: cooperative, no distress and slowed mentation Neurologic: bilaetrria leg weakness unchanged from several days ago but improved from admission Heart: regular rate and rhythm, S1, S2 normal, no murmur, click, rub or gallop Lungs: clear to auscultation bilaterally Abdomen: soft, non-tender; bowel sounds normal; no masses,  no organomegaly Extremities: extremities normal, atraumatic, no cyanosis or edema and Homans sign is negative, no sign of DVT, see neuro  Lab Results: CBC: Recent Labs  07/09/14 0620  WBC 7.2  HGB 10.8*  HCT 32.7*  PLT 326   BMET:  Recent Labs  07/08/14 0340 07/09/14 0620  NA 135* 134*  K 4.8 4.4  CL 101 99  CO2 20 19  GLUCOSE 109* 106*  BUN 34* 30*  CREATININE 1.66* 1.54*  CALCIUM 9.4 9.4    PT/INR: No results found for this basename: LABPROT, INR,  in the last 72 hours   Radiology Dg Abd 1 View  07/08/2014   CLINICAL DATA:  Constipation.  Spinal cord ischemia.  EXAM: ABDOMEN - 1 VIEW  COMPARISON:  CT of the abdomen 07/04/2014  FINDINGS: The bowel gas pattern is normal. No radio-opaque calculi or other significant radiographic abnormality are seen. There is moderate stool burden. Visualized osseous structures have a normal appearance.  IMPRESSION: Moderate stool burden.   Electronically Signed   By: Rosalie Gums M.D.   On: 07/08/2014 18:22   Dg Chest Port 1 View  07/09/2014   CLINICAL DATA:  Chest pain and shortness of Breath.  EXAM: PORTABLE CHEST - 1 VIEW  COMPARISON:  07/06/2014  FINDINGS: Mild basilar opacity, greater on the left, is consistent with atelectasis. No significant pleural fluid. No pneumothorax. No evidence of pulmonary edema or pneumonia.  Cardiac silhouette is normal in size. No mediastinal or hilar masses.  IMPRESSION: Mild stable basilar atelectasis, greater on the left. No evidence of pneumonia or edema. No  pneumothorax. No change from the prior study.   Electronically Signed   By: Amie Portland M.D.   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

## 2014-07-10 NOTE — Patient Care Conference (Signed)
Inpatient RehabilitationTeam Conference and Plan of Care Update Date: 07/09/2014   Time: 2:20 PM    Patient Name: Charlotte [name]      Medical Record Number: [id number]  Date of Birth: June 26, 1966 Sex: Female         Room/Bed: 4W18C/4W18C-01 Payor Info: Payor: MEDICAID POTENTIAL / Plan: MEDICAID POTENTIAL / Product Type: *No Product type* /    Admitting Diagnosis: Spinal cord infarct  Admit Date/Time:  07/08/2014  4:54 PM Admission Comments: No comment available   Primary Diagnosis:  <principal problem not specified> Principal Problem: <principal problem not specified>  Patient Active Problem List   Diagnosis Date Noted  . 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 Polycystic kidney disease 06/29/2014  . Aortic dissection distal to left subclavian 06/29/2014  . 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 Acute infarction of spinal cord 06/29/2014  . 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 Aortic dissection 06/29/2014  . ANEMIA 02/09/2011  . TOBACCO ABUSE 02/09/2011  . COCAINE ABUSE 02/09/2011  . HYPERTENSION, ESSENTIAL 02/09/2011  . NAUSEA 02/09/2011    Expected Discharge Date: Expected Discharge Date: 07/26/14  Team Members Present: Physician leading conference: Dr. Faith RogueZachary Swartz Social Worker Present: Amada JupiterLucy Danton Palmateer, LCSW Nurse Present: Phylliss BobKom Avegno, RN PT Present: Edman CircleAudra Hall, PT;Jess Glenetta HewGodfrey, PT OT Present: Donzetta KohutFrank Barthold, OT SLP Present: Feliberto Gottronourtney Payne, SLP PPS Coordinator present : Edson SnowballBecky Windsor, PT     Current Status/Progress Goal Weekly Team Focus  Medical   spinal cord infarct due to embolus, reactive anger/mood issues, pain  improve participation and activity toleranc  improve engagement and awareness   Bowel/Bladder   continent of bowel and bladder; LBM 8/18 post fleet enema; void 100-150cc dark colored malodorous urine  remain continet, attain full bladder emptying  bladder  retraining assess bowl pattern    Swallow/Nutrition/ Hydration             ADL's   Max Assist with BADL and transfers  Overall Supervision-Min A  Endurance, transfers, sitting balancre, pain management   Mobility   min A scoot transfer; mod A sit to stand; S' w/c mobility  mod I w/c management; S' transfers and short distance ambulation  coordination with transfers, B LE strengthening, standing and gait, w/c endurance   Communication             Safety/Cognition/ Behavioral Observations            Pain   lower back pain-oxy IR 10-15mg    pain <4  asses pain and treat accordingly, offer alternative non-pharmaceutical pain relief methods   Skin   pt has surgical punture site to mid lower back OTA; BL feet dry flakey skin  remain free of skin breakdown  conitune to monitor skin daily, turn pt q2hrs    Rehab Goals Patient on target to meet rehab goals: Yes Rehab Goals Revised: NA *See Care Plan and progress notes for long and short-term goals.  Barriers to Discharge: mood/ adjustment reaction    Possible Resolutions to Barriers:  egosupport, encouragement, psych,family assist    Discharge Planning/Teaching Needs:  Plans to return home with her daughter, [name], who is available to provide 24/7 assistance  plan ed sessions closer to d/c.   Team Discussion:  New eval.  Reporting CP in the evening.  Experiencing a lot of anxiety.  Pt's mother very involved and "in charge" of patient.  Concern that daughters may not be able to manage  the assist level pt might need.w/c level goals so need to check accessibility of home entry.  Revisions to Treatment Plan:  Will add neuropsych consult to tx plan.   Continued Need for Acute Rehabilitation Level of Care: The patient requires daily medical management by a physician with specialized training in physical medicine and rehabilitation for the following conditions: Daily direction of a multidisciplinary physical rehabilitation program to ensure  safe treatment while eliciting the highest outcome that is of practical value to the patient.: Yes Daily medical management of patient stability for increased activity during participation in an intensive rehabilitation regime.: Yes Daily analysis of laboratory values and/or radiology reports with any subsequent need for medication adjustment of medical intervention for : Cardiac problems;Post surgical problems;Pulmonary problems;Neurological problems  Maytte Jacot 07/10/2014, 12:03 PM

## 2014-07-10 NOTE — Progress Notes (Signed)
[place] PHYSICAL MEDICINE & REHABILITATION     PROGRESS NOTE    Subjective/Complaints: Chest still sore. Has pain along sternum/xyphoid. Not wearing oxygen this am. A  review of systems has been performed and if not noted above is otherwise negative.    Objective: Vital Signs: Blood pressure 142/69, pulse 70, temperature 98.7 F (37.1 C), temperature source Oral, resp. rate 20, height 5\' 5"  (1.651 m), weight 67.858 kg (149 lb 9.6 oz), SpO2 97.00%. Dg Abd 1 View  07/08/2014   CLINICAL DATA:  Constipation.  Spinal cord ischemia.  EXAM: ABDOMEN - 1 VIEW  COMPARISON:  CT of the abdomen 07/04/2014  FINDINGS: The bowel gas pattern is normal. No radio-opaque calculi or other significant radiographic abnormality are seen. There is moderate stool burden. Visualized osseous structures have a normal appearance.  IMPRESSION: Moderate stool burden.   Electronically Signed   By: Rosalie GumsBeth  Brown M.D.   On: 07/08/2014 18:22   Dg Chest Port 1 View  07/09/2014   CLINICAL DATA:  Chest pain and shortness of Breath.  EXAM: PORTABLE CHEST - 1 VIEW  COMPARISON:  07/06/2014  FINDINGS: Mild basilar opacity, greater on the left, is consistent with atelectasis. No significant pleural fluid. No pneumothorax. No evidence of pulmonary edema or pneumonia.  Cardiac silhouette is normal in size. No mediastinal or hilar masses.  IMPRESSION: Mild stable basilar atelectasis, greater on the left. No evidence of pneumonia or edema. No pneumothorax. No change from the prior study.   Electronically Signed   By: Amie Portlandavid  Ormond M.D.   On: 07/09/2014 08:12    Recent Labs  07/09/14 0620  WBC 7.2  HGB 10.8*  HCT 32.7*  PLT 326    Recent Labs  07/08/14 0340 07/09/14 0620  NA 135* 134*  K 4.8 4.4  CL 101 99  GLUCOSE 109* 106*  BUN 34* 30*  CREATININE 1.66* 1.54*  CALCIUM 9.4 9.4   CBG (last 3)   Recent Labs  07/08/14 1618  GLUCAP 125*    Wt Readings from Last 3 Encounters:  07/08/14 67.858 kg (149 lb 9.6 oz)   07/07/14 66.588 kg (146 lb 12.8 oz)  02/09/11 59.648 kg (131 lb 8 oz)    Physical Exam:  Constitutional: [name], place, and time. She appears well-developed and well-nourished.  HENT: oral mucosa moist Head: Normocephalic and atraumatic.  Eyes: Conjunctivae are normal. Pupils are equal, round, and reactive to light.  Neck: Normal range of motion. Neck supple.  Cardiovascular: Regular rhythm and rate  Respiratory: Effort normal and breath sounds normal. No respiratory distress. She has no wheezes. No rales GI: Soft. Bowel sounds are normal. She exhibits no distension. There is no tenderness.  Musculoskeletal: She exhibits no edema. Tenderness to palpation over sternum and xyphoid Neurological: She is alert and oriented to person, place, and time.  LLE>RLE weakness. 1-2/5 with HF/KE (inconsistent effort) 2+/5 PF/DF.  Upper extremities strength 4+/5 deltoid, bicep, tricep, grip  Diminished sensation to PP/LT from breasts distally, 1+/2  Skin: Skin is warm and dry.  Psychiatric: anxious, non-irritable   Assessment/Plan: 1. 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 Physiatrist is providing close team supervision and 24 hour management of active medical problems listed below. Physiatrist and rehab team continue to assess barriers to discharge/monitor patient progress toward functional and medical goals. FIM: FIM - Bathing Bathing Steps Patient Completed: Chest;Right Arm;Left Arm;Abdomen;Front  perineal area;Right upper leg;Left upper leg Bathing: 3: Mod-Patient completes 5-7 75f 10 parts or 50-74%  FIM - Upper Body Dressing/Undressing Upper body dressing/undressing: 0: Wears gown/pajamas-no public clothing FIM - Lower Body Dressing/Undressing Lower body dressing/undressing: 0: Wears gown/pajamas-no public clothing  FIM -  Toileting Toileting: 1: Total-Patient completed zero steps, helper did all 3  FIM - Diplomatic Services operational officer Devices: Psychiatrist Transfers: 3-To toilet/BSC: Mod A (lift or lower assist);3-From toilet/BSC: Mod A (lift or lower assist)  FIM - Bed/Chair Transfer Bed/Chair Transfer Assistive Devices: Bed rails;HOB elevated Bed/Chair Transfer: 0: Activity did not occur  FIM - Locomotion: Wheelchair Locomotion: Wheelchair: 0: Activity did not occur FIM - Locomotion: Ambulation Locomotion: Ambulation: 0: Activity did not occur  Comprehension Comprehension Mode: Auditory Comprehension: 5-Follows basic conversation/direction: With no assist  Expression Expression Mode: Verbal Expression: 5-Expresses complex 90% of the time/cues < 10% of the time  Social Interaction Social Interaction: 5-Interacts appropriately 90% of the time - Needs monitoring or encouragement for participation or interaction.  Problem Solving Problem Solving: 5-Solves complex 90% of the time/cues < 10% of the time  Memory Memory: 5-Recognizes or recalls 90% of the time/requires cueing < 10% of the time  Medical Problem List and Plan:  1. 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 DVT Prophylaxis/Anticoagulation: Mechanical: Antiembolism stockings, knee (TED hose) Bilateral lower extremities  Sequential compression devices, below knee Bilateral lower extremitie  3. Pain Management: Will increase oxycodone to 15 mg and discontinue prn dilaudid. Will add  4. Mood: LCSW to follow for evaluation and support.  5. Neuropsych: This patient is capable of making decisions on her own behalf.  6. Skin/Wound Care: Turn patient every 2 hours. Pressure relief measures. Maintain adequate hydration and nutritional status.  7. Hyponatremia: 134 yesterday 8. Acute renal failure: push po fluids.  Monitor for orthostasis. Labs holding steady 9. Constipation: +BM this am 10. Chest pain---EKG now, SL nitro, labs pending, cxr  --has hx of chest pain due to hematoma,effusions prior to transfer to rehab, likely musculoskeletal component also.   -rx pain symptomatically as well  -surgery follow up  LOS (Days) 2 A FACE TO FACE EVALUATION WAS PERFORMED  [name] Roig T 07/10/2014 8:44 AM

## 2014-07-10 NOTE — Progress Notes (Signed)
VASCULAR LAB PRELIMINARY  PRELIMINARY  PRELIMINARY  PRELIMINARY  Bilateral lower extremity venous duplex completed.    Preliminary report:  Bilateral:  No evidence of DVT, superficial thrombosis, or Baker's Cyst.   Sander Remedios, RVS 07/10/2014, 4:47 PM

## 2014-07-10 NOTE — Progress Notes (Signed)
Recreational Therapy Session Note  Patient Details  Name: Charlotte [name] MRN: [id number] Date of Birth: 1966/05/04 Today's Date: 07/10/2014  Order received & chart reviewed.  Pt placed on HOLD at this time for TR services.  Will continue to monitor for appropriateness through team.  Shailene Demonbreun 07/10/2014, 8:14 AM

## 2014-07-10 NOTE — Progress Notes (Signed)
Physical Therapy Session Note  Patient Details  Name: Rowe RobertHelene J Muntean MRN: [id number] Date of Birth: 1966/06/06  Today's Date: 07/10/2014 PT Individual Time: 0830-0930 PT Individual Time Calculation (min): 60 min   Short Term Goals: Week 1:  PT Short Term Goal 1 (Week 1): Pt will tolerate 60 minutes of therapeutic activity PT Short Term Goal 2 (Week 1): Pt will perform all bed mobility w/ MinA PT Short Term Goal 3 (Week 1): Pt will tolerate sitting up in wheelchair for 3 hrs at a time PT Short Term Goal 4 (Week 1): Pt will consistently transfer bed<>w/c w/ MinA PT Short Term Goal 5 (Week 1): Pt will perform sit<>stand w/ ModA  Skilled Therapeutic Interventions/Progress Updates:    Therapeutic Activity: PT instructs pt in rolling R req SBA with rail and rolling L req min A with rail to place diaper. PT instructs pt in rolling R req min A without rail and rolling L req min A without rail to re-place brief. PT instructs pt in scooting in supine to the R req min A at the shoulders. PT instructs pt in L side lie to sit edge of bed req mod A without rail. PT instructs pt in scoot transfer to R (strong side) req min A and verbal cues for head-hips relationship and hand placement with poor following of commands. PT gives pt wet washcloth for sponge bath sitting up on edge of bed req set up assist for upper body and chest/abdomen only. Pt also washes face with set up assist.   W/C Management: PT instructs pt in w/c propulsion x 30' + 45' req SBA and verbal cues for B UE usage to steer w/c. PT instructs pt in legrest management, swinging away, req visual cues for lever and verbal cues for technique.   Neuromuscular Reeducation: PT instructs pt in w/c push-ups/partial standing req min-mod A x 5 reps x 2 sets with cues for head-hips relationship and hand placement. Pt fatigues quickly. PT instructs pt in sit to stand with RW and PT blocking one knee and manual assisting with gait belt req mod A - only able  to maintain standing for a few seconds with very poor balance; pt then assisted back into w/c.   Pt works hard during this session and demonstrates improvement in functional mobility. Pt will benefit from continued work on transfers, w/c propulsion/endurance, with progression to standing/ambulation, as tolerated. Pt may benefit from being put in an overhead lift to practice ambulation for safety.     Therapy Documentation Precautions:  Precautions Precautions: Fall Restrictions Weight Bearing Restrictions: No    Vital Signs: Therapy Vitals Temp: 98.7 F (37.1 C) Temp src: Oral Pulse Rate: 70 Resp: 20 BP: 142/69 mmHg Patient Position (if appropriate): Lying Oxygen Therapy SpO2: 97 % O2 Device: None (Room air) O2 Flow Rate (L/min): 2 L/min Pain: Pain Assessment Pain Assessment: 0-10 Pain Score: 9  Pain Type: Other (Comment) Pain Location: Chest Pain Orientation: Mid Pain Descriptors / Indicators: Burning Pain Onset: On-going Patients Stated Pain Goal: 4 Pain Intervention(s): Rest Multiple Pain Sites: No  See FIM for current functional status  Therapy/Group: Individual Therapy  Asianae Minkler M 07/10/2014, 8:37 AM

## 2014-07-10 NOTE — Progress Notes (Signed)
Occupational Therapy Session Note  Patient Details  Name: Charlotte RobertHelene J [name] MRN: [id number] Date of Birth: 1966-01-22  Today's Date: 07/10/2014 OT Individual Time: 1100-1200 OT Individual Time Calculation (min): 60 min  Short Term Goals: Week 1:  OT Short Term Goal 1 (Week 1): Pt will perform upper body bathing and dressing with setup assist OT Short Term Goal 2 (Week 1): Pt will complete lower body bathing supine using AE with mod assist OT Short Term Goal 3 (Week 1): Pt will complete transfer on/off tub bench with mod assist OT Short Term Goal 4 (Week 1): Pt will complete lower body dressing with min assist OT Short Term Goal 5 (Week 1): Pt will complete toileting with min assist  Skilled Therapeutic Interventions/Progress Updates:  Patient received supine in bed with increased fatigue, but no complaints of pain. Patient refused bathing & dressing this am. With moderate encouragement, patient willing to work with therapist. Patient engaged in bed mobility with moderate assistance (therapist assisting with BLEs) and sat EOB. Patient transferred EOB>w/c, squat pivot technique, with moderate assistance. Patient maneuvered w/c > sink for grooming tasks of brushing teeth and washing face while seated. Patient then propelled self from room> therapy gym. Therapist educated patient on pressure relief techniques, when to perform pressure relief, and importance of pressure relief. Therapist administered timer > patient and encouraged patient to perform pressure relief every 25 minutes for at least 2-3 minutes while seated in w/c. Also discussed importance of turning every 2 hours when in bed. Discussed all pressure relief information with patient's significant other as well. Patient also engaged in BUE strengthening exercise using ergometer with multiple rest breaks. Patient propelled self back to room and transferred w/c>EOB with moderate assistance. At end of session, left patient supine in bed with all needs  within reach.   Precautions:  Precautions Precautions: Fall Restrictions Weight Bearing Restrictions: No  See FIM for current functional status  Therapy/Group: Individual Therapy  Tonnia Bardin 07/10/2014, 12:21 PM

## 2014-07-10 NOTE — Progress Notes (Signed)
Physical Therapy Session Note  Patient Details  Name: Charlotte [name] MRN: [id number] Date of Birth: [birth year]  Today's Date: 07/10/2014 PT Individual Time: 1300-1400 PT Individual Time Calculation (min): 60 min   Short Term Goals: Week 1:  PT Short Term Goal 1 (Week 1): Pt will tolerate 60 minutes of therapeutic activity PT Short Term Goal 2 (Week 1): Pt will perform all bed mobility w/ MinA PT Short Term Goal 3 (Week 1): Pt will tolerate sitting up in wheelchair for 3 hrs at a time PT Short Term Goal 4 (Week 1): Pt will consistently transfer bed<>w/c w/ MinA PT Short Term Goal 5 (Week 1): Pt will perform sit<>stand w/ ModA  Skilled Therapeutic Interventions/Progress Updates:    Pt received supine in bed, agreeable to participate in therapy w/ min encouragement. Pt transferred supine>sit w/ ModA for managing BLE, definite need for use of bed rails and HOB functions. Pt performed lateral scoot transfer bed>w/c w/ MinA for safety. Pt propelled w/c 150' w/ S, MaxA for managing w/c parts. Pt performed lateral scoot transfer w/ MinA for safety w/c>mat table. ModA for scooting down length of mat table. Worked on pre-standing exercises, yoga blocks for UE push and max cues to move slowly and to lean forward for weight shift onto feet. Manual stretching for HS/calf flexibility while seated EOB 2x30" each LE. Pt moved sit<>supine w/ ModA on mat table, able to roll L and R w/ MinA, unable to maintain knees in hook lying position while supine. NMR for RLE/LLE w/ PNF techniques in sidelying to increase activation/initiation. Pt w/ lateral scoot transfer mat table>w/c w/ MinA. Attempted to have pt stand in parallel bars, pt required max cues for hand placement, MaxA to come to standing but pt very frustrated and anxious and unable to maintain standing. Pt requested to go back to room. Pt propelled w/c back to room w/ S. Therapist asked pt to set up w/c for transfer, pt unable to position w/c appropriately, was  able to identify brakes needed to be locked and leg rests needed to be removed, did not identify that arm rest needed to be flipped back. Lateral scoot transfer back to bed w/ minA. Sit>supine w/ ModA. Performed NMR for BLE supine in bed w/ manual facilitation of knee extension, hip flexion, hip extension. Pt left supine in bed w/ all needs within reach.  Therapy Documentation Precautions:  Precautions Precautions: Fall Restrictions Weight Bearing Restrictions: No Vital Signs: Therapy Vitals Temp: 98.5 F (36.9 C) Temp src: Oral Pulse Rate: 65 Resp: 18 BP: 145/64 mmHg Patient Position (if appropriate): Lying Oxygen Therapy SpO2: 92 % O2 Device: None (Room air) Pain: Pain Assessment Pain Assessment: 0-10 Pain Score: 8  Pain Type: Intractable pain Pain Location: Chest Pain Orientation: Mid Pain Descriptors / Indicators: Burning Pain Onset: On-going Pain Intervention(s):  (Pt pre-medicated prior to session)  See FIM for current functional status  Therapy/Group: Individual Therapy  Charlotte [name], Charlotte [name] Charlotte SpangleJess Jasmina Gendron, PT, DPT 07/10/2014, 4:54 PM

## 2014-07-11 ENCOUNTER — Inpatient Hospital Stay (HOSPITAL_COMMUNITY): Payer: Medicaid Other | Admitting: Physical Therapy

## 2014-07-11 ENCOUNTER — Encounter (HOSPITAL_COMMUNITY): Payer: Self-pay | Admitting: Occupational Therapy

## 2014-07-11 ENCOUNTER — Inpatient Hospital Stay (HOSPITAL_COMMUNITY): Payer: Medicaid Other | Admitting: *Deleted

## 2014-07-11 DIAGNOSIS — I10 Essential (primary) hypertension: Secondary | ICD-10-CM

## 2014-07-11 MED ORDER — BISACODYL 10 MG RE SUPP
10.0000 mg | Freq: Every day | RECTAL | Status: DC
  Administered 2014-07-12 – 2014-07-19 (×4): 10 mg via RECTAL
  Filled 2014-07-11 (×11): qty 1

## 2014-07-11 MED ORDER — ATORVASTATIN CALCIUM 20 MG PO TABS
20.0000 mg | ORAL_TABLET | Freq: Every day | ORAL | Status: DC
  Administered 2014-07-11 – 2014-07-24 (×14): 20 mg via ORAL
  Filled 2014-07-11 (×15): qty 1

## 2014-07-11 NOTE — Progress Notes (Signed)
Physical Therapy Session Note  Patient Details  Name: Charlotte [name] MRN: [id number] Date of Birth: 10-31-1966  Today's Date: 07/11/2014 PT Individual Time: 0930-1030 Tx 2: 1510-1610 PT Individual Time Calculation (min): 60 min  Tx 2: 60 min  Short Term Goals: Week 1:  PT Short Term Goal 1 (Week 1): Pt will tolerate 60 minutes of therapeutic activity PT Short Term Goal 2 (Week 1): Pt will perform all bed mobility w/ MinA PT Short Term Goal 3 (Week 1): Pt will tolerate sitting up in wheelchair for 3 hrs at a time PT Short Term Goal 4 (Week 1): Pt will consistently transfer bed<>w/c w/ MinA PT Short Term Goal 5 (Week 1): Pt will perform sit<>stand w/ ModA  Skilled Therapeutic Interventions/Progress Updates:    Therapeutic Activity: PT instructs pt in rolling R and L in bed without rail req min A at the LE for assist - pt reaches for the edge of her bed with UEs to assist in pulling. PT instructs pt in scooting in supine req mod A and verbal cues for segmental scoot technique. PT instructs pt in rolling L in bed with SBA and verbal cues for technique on 2nd attempt. PT instructs pt in L side lie to sit edge of bed req mod A for B LE and min A at trunk. PT instructs pt in head-hips relationship to scoot to R bed to w/c req min A for assisted bottom clearance; pt does not demonstrate following commands for head hips relationship.   Therapeutic Exercise: PT instructs pt in bed level exercises A/AROM as needed for ROM and strengthening: ankle pumps, heel slides, hip IR in B hook lie, assisted bridges with PT facilitating anterior translation of tibia and stabilizing feet, clam shells, supine hip abduction/adduction x 10 reps each LE.   Neuromuscular Reeducation: PT instructs pt in w/c push-ups x 10 reps to practice pressure relief. PT instructs pt in modified abdominal crunches sitting up in w/c in order to facilitate core muscle activation x 10 reps and improve dynamic sitting stability - pt require  AAROM. PT instructs pt in partial sit to stand with RW - one arm pushing off of armrest and one arm on low side crossbar of walker x 10 reps req mod A. PT instructs pt in sit to stand with knees blocked without AD req tot A.   Session 2: Therapeutic Exercise: PT instructs pt in B LE ROM and strengthening exercises: heel cord stretch x 30 seconds, AAROM heel slides, min resisted hip/knee extension, clam shells, gravity minimized hip flexion, bridges (initiated), hip IR in B hook lie, LAQ, seated march, seated hip adduction/abduction x 10 reps each.   Therapeutic Activity: PT instructs pt in rolling R in bed without rail req min A for L LE, rolling L in bed without rail req CGA and verbal cues, L side lie to sit req verbal cues for technique and mod A. PT instructs pt in scooting laterally on edge of bed req CGA-min A for safety and leg management. PT instructs pt in sit to supine transfer utilizing leg lifter req mod A to get legs into bed.   Neuromuscular Reeducation: Pt demonstrates fair-good static sitting balance with foot support without UE support. PT instructs pt in dynamic sitting balance activity: reaching for items laterally and passing them to the other hand, then reaching laterally to drop into a bucket. Pt repeats this exercise by reaching arms across her body, 1 LOB req min A to correct. PT instructs pt  in PNF abdominal strengthening technique with legs in B hook lie and PT pushing into each diagonal x 10 reps each.   PT can feel or see partial muscle activity throughout all major muscle groups in pt's B LEs. Pt req significant encouragement to participate in therapy session, today. Pt fatigued after AM session and wishes to go back to bed. PT checked pt's vitals and BP remained stable at end of session, as well as HR and SaO2 on RA. Pt to receive OT treatment after a 30 minute rest break sitting up in w/c. PT spoke with RN and asked him to order pt B PRAFOs to be worn at night due to pt's  foot drop in order to prevent gastroc contractures. Pt's mother present during AM session and daughters present for part of PM session. Pt will benefit from continued work on B LE strengthening, neuromuscular facilitation, and progress towards independent bed mobiilty and transfers. Pt refused therapy outside of bedroom today due to feeling sick, but when pt is willing, she will benefit from w/c propulsion and gait training with body weight support or in maxi sky lift.   Therapy Documentation Precautions:  Precautions Precautions: Fall Restrictions Weight Bearing Restrictions: No    Vital Signs: AM session Therapy Vitals Temp: 98.3 F (36.8 C) Temp src: Oral Pulse Rate: 59 Resp: 18 BP: 132/73 mmHg Patient Position (if appropriate): Lying Oxygen Therapy SpO2: 99 % O2 Device: None (Room air) Pain: Pain Assessment Pain Assessment: 0-10 Pain Score: 9  Pain Type: Chronic pain Pain Location: Chest Pain Orientation: Posterior Pain Descriptors / Indicators: Aching Pain Onset: On-going Pain Intervention(s): Rest Multiple Pain Sites: No Tx 2: Pt c/o 9/10 chest pain (vitals normal) and PT utilizes distraction for pain relief.  See FIM for current functional status  Therapy/Group: Individual Therapy  Kaiser Belluomini M 07/11/2014, 9:41 AM

## 2014-07-11 NOTE — Progress Notes (Signed)
Occupational Therapy Session Note  Patient Details  Name: Charlotte [name] MRN: [id number] Date of Birth: 1966-08-23  Today's Date: 07/11/2014 OT Individual Time: 1100-1145 OT Individual Time Calculation (min): 45 min   Short Term Goals: Week 1:  OT Short Term Goal 1 (Week 1): Pt will perform upper body bathing and dressing with setup assist OT Short Term Goal 2 (Week 1): Pt will complete lower body bathing supine using AE with mod assist OT Short Term Goal 3 (Week 1): Pt will complete transfer on/off tub bench with mod assist OT Short Term Goal 4 (Week 1): Pt will complete lower body dressing with min assist OT Short Term Goal 5 (Week 1): Pt will complete toileting with min assist  Skilled Therapeutic Interventions/Progress Updates:    Pt very quiet when therapist entered room.  She would only respond with head movements yes or no and mumble.  Pt's mother and niece present for beginning of session.  Pt sitting in wheelchair with hospital gowns she finally stated that they had already helped her with bathing this am secondary to getting sick.  Pt did agree to work on dressing at the sink this session however.  She was able to perform all UB dressing with setup.  Began utilization of the reacher to assist with donning pants over feet.  She was able to thread the pants over the LLE but needed min to mod assist on the right side.  Total assist for partial sit to stand in order for her to pull her pants over her hips.  Pt reporting increased nausea during session.  Attempted to take pt out to the therapy gym but she refused secondary to not feeling well.  Pt agreed to brush her teeth at the sink, and she performed with supervision.  Unfortunately she became sick after brushing her teeth and vomited X 3 into the sink.  Nursing notified of situation.  Pt wanting to get back to bed secondary to not feeling well.  She transferred back to the bed scoot pivot with mod assist and then transitioned to supine with  mod assist.  BP taken at 161/75.  Pt missed 15 mins of session secondary to being sick.   Therapy Documentation Precautions:  Precautions Precautions: Fall Restrictions Weight Bearing Restrictions: No General: General OT Amount of Missed Time: 15 Minutes Vital Signs: Therapy Vitals Pulse Rate: 59 BP: 132/73 mmHg Patient Position (if appropriate): Lying Oxygen Therapy SpO2: 99 % O2 Device: None (Room air) Pain: Pain Assessment Pain Assessment: Faces Pain Score: 9  Faces Pain Scale: Hurts a little bit Pain Type: Acute pain Pain Location: Back Pain Orientation: Posterior Pain Onset: On-going Pain Intervention(s): Repositioned;Emotional support Multiple Pain Sites: No ADL: See FIM for current functional status  Therapy/Group: Individual Therapy  Dorthula Bier OTR/L 07/11/2014, 12:30 PM

## 2014-07-11 NOTE — IPOC Note (Deleted)
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     Functional Problem List: Nursing Bladder;Bowel;Endurance;Medication Management;Nutrition;Pain;Perception;Safety;Sensory  PT Balance;Endurance;Motor;Pain;Perception;Sensory  OT Behavior;Endurance;Pain;Safety;Motor;Balance  SLP    TR         Basic ADL's: OT Bathing;Dressing;Toileting     Advanced  ADL's: OT       Transfers: PT Bed Mobility;Bed to Chair;Car;Furniture  OT Toilet;Tub/Shower     Locomotion: PT Ambulation;Wheelchair Mobility;Stairs     Additional Impairments: OT    SLP        TR      Anticipated Outcomes Item Anticipated Outcome  Self Feeding Supervision  Swallowing      Basic self-care  Min A  Toileting  Supervision   Bathroom Transfers Supervision  Bowel/Bladder  Manage bowel and bladder program with max assist  Transfers  Supervision  Locomotion  mod (I) from w/c level w/ S for short distance ambulation  Communication     Cognition     Pain  5 orless  Safety/Judgment  Mod assist   Therapy Plan: PT Intensity: Minimum of 1-2 x/day ,45 to 90 minutes PT Frequency: 5 out of 7 days PT Duration Estimated Length of Stay: 2-3 weeks OT Intensity: Minimum of 1-2 x/day, 45 to 90 minutes OT Frequency: 5 out of 7 days OT Duration/Estimated Length of Stay: 2-3 wks         Team Interventions: Nursing Interventions Patient/Family Education;Bladder Management;Bowel Management;Disease Management/Prevention;Pain Management;Medication Management;Discharge Planning  PT interventions Balance/vestibular training;Discharge planning;DME/adaptive equipment instruction;Neuromuscular re-education;Functional mobility training;Pain management;Patient/family education;Psychosocial support;Stair training;Therapeutic  Activities;Therapeutic Exercise;UE/LE Coordination activities;UE/LE Strength taining/ROM  OT Interventions Balance/vestibular training;Therapeutic Activities;Therapeutic Exercise;UE/LE Strength taining/ROM;Wheelchair propulsion/positioning;Self Care/advanced ADL retraining;Pain management;Patient/family Clinical research associateeducation;DME/adaptive equipment instruction;Discharge planning  SLP Interventions    TR Interventions    SW/CM Interventions Discharge Planning;Psychosocial Support;Patient/Family Education    Team Discharge Planning: Destination: PT-Home ,OT- Home , SLP-  Projected Follow-up: PT-Home health PT, OT-  Home health OT, SLP-  Projected Equipment Needs: PT-Wheelchair (measurements);Wheelchair cushion (measurements);To be determined, OT- To be determined, SLP-  Equipment Details: PT-possible need for ambulation AD, TBD, OT-  Patient/family involved in discharge planning: PT- Patient,  OT-Patient;Family member/caregiver, SLP-   MD ELOS: 20-22 days   Medical Rehab Prognosis:  Good Assessment: 48 y.o. female with history of HTN, CKD, polysubstance abuse in the past, bipolar disorder; who was admitted on 06/29/14 with sudden onset of severe chest pain with SOB and inability to move BLE. Patient noted to have A fib at admission. 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 Neurology consulted for input and felt that patient likely with acute anterior spinal cord infarction secondary to the segmental artery occlusion, including artery of Adamkiewicz or secondary to embolic phenomenon due to cardiac source   Now requiring 24/7 Rehab RN,MD, as well as CIR level PT, OT .  Treatment team will focus on ADLs and mobility with goals set at sup/minA   See Team Conference Notes for weekly updates to the plan of care

## 2014-07-11 NOTE — Progress Notes (Signed)
Social Work Patient ID: [name], female   DOB: 1966-06-03, 48 y.o.   MRN: [id number]  Have reviewed team conference with pt and daughter Charlotte Sheldon([name]) and both aware of targeted d/c date 9/4 with w/c level goals overall.  Daughter very concerned that MA will not cover follow up therapies based on this diagnosis.  Will make sure that tx team aware of this as well.  Pt remain depressed about her situation.  Unfortunately, not able to get her on schedule for neuropsychology this week but will have her down for next.  Will continue to follow for community resource referral and support.  Jenasis Straley, LCSW

## 2014-07-11 NOTE — Progress Notes (Signed)
[facility]     PROGRESS NOTE    Subjective/Complaints: Chest ?a little less tender. Participated in therapies A  review of systems has been performed and if not noted above is otherwise negative.    Objective: Vital Signs: Blood pressure 146/74, pulse 67, temperature 98.3 F (36.8 C), temperature source Oral, resp. rate 18, height 5\' 5"  (1.651 m), weight 69.582 kg (153 lb 6.4 oz), SpO2 95.00%. No results found.  Recent Labs  07/09/14 0620  WBC 7.2  HGB 10.8*  HCT 32.7*  PLT 326    Recent Labs  07/09/14 0620  NA 134*  K 4.4  CL 99  GLUCOSE 106*  BUN 30*  CREATININE 1.54*  CALCIUM 9.4   CBG (last 3)   Recent Labs  07/08/14 1618  GLUCAP 125*    Wt Readings from Last 3 Encounters:  07/10/14 69.582 kg (153 lb 6.4 oz)  07/07/14 66.588 kg (146 lb 12.8 oz)  02/09/11 59.648 kg (131 lb 8 oz)    Physical Exam:  Constitutional: [name], place, and time. She appears well-developed and well-nourished.  HENT: oral mucosa moist Head: Normocephalic and atraumatic.  Eyes: Conjunctivae are normal. Pupils are equal, round, and reactive to light.  Neck: Normal range of motion. Neck supple.  Cardiovascular: Regular rhythm and rate  Respiratory: Effort normal and breath sounds normal. No respiratory distress. She has no wheezes. No rales GI: Soft. Bowel sounds are normal. She exhibits no distension. There is no tenderness.  Musculoskeletal: She exhibits no edema. Tenderness to palpation over sternum and xyphoid Neurological: She is alert and oriented to person, place, and time.  LLE>RLE weakness. 1-2/5 with HF/KE (inconsistent effort) 2+/5 PF/DF.  Upper extremities strength 4+/5 deltoid, bicep, tricep, grip  Diminished sensation to PP/LT from breasts distally, 1+/2  Skin: Skin is warm and dry.  Psychiatric: anxious, non-irritable   Assessment/Plan: 1. 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 Physiatrist is providing close team supervision and 24 hour management of active medical problems listed below. Physiatrist and rehab team continue to assess barriers to discharge/monitor patient progress toward functional and medical goals. FIM: FIM - Bathing Bathing Steps Patient Completed: Chest;Right Arm;Left Arm;Abdomen Bathing: 2: Max-Patient completes 3-4 71f 10 parts or 25-49%  FIM - Upper Body Dressing/Undressing Upper body dressing/undressing: 0: Wears gown/pajamas-no public clothing FIM - Lower Body Dressing/Undressing Lower body dressing/undressing: 0: Wears gown/pajamas-no public clothing  FIM - Toileting Toileting: 1: Total-Patient completed zero steps, helper did all 3  FIM - Diplomatic Services operational officer Devices: Psychiatrist Transfers: 3-To toilet/BSC: Mod A (lift or lower assist);3-From toilet/BSC: Mod A (lift or lower assist)  FIM - Bed/Chair Transfer Bed/Chair Transfer Assistive Devices: Arm rests Bed/Chair Transfer: 4: Bed > Chair or W/C: Min A (steadying Pt. > 75%);3: Supine > Sit: Mod A (lifting assist/Pt. 50-74%/lift 2 legs  FIM - Locomotion: Wheelchair Distance: 45 Locomotion: Wheelchair: 1: Travels less than 50 ft with supervision, cueing or coaxing FIM - Locomotion: Ambulation Locomotion: Ambulation: 0: Activity did not occur  Comprehension Comprehension Mode: Auditory Comprehension: 5-Understands basic 90% of the time/requires cueing < 10% of the time  Expression Expression Mode: Verbal Expression: 5-Expresses complex 90% of the time/cues < 10% of the time  Social Interaction Social Interaction: 5-Interacts appropriately 90% of the time - Needs monitoring or encouragement for participation or interaction.  Problem  Solving Problem Solving: 5-Solves complex 90% of the time/cues < 10% of the time  Memory Memory:  5-Recognizes or recalls 90% of the time/requires cueing < 10% of the time  Medical Problem List and Plan:  1. 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 DVT Prophylaxis/Anticoagulation: Mechanical: Antiembolism stockings, knee (TED hose) Bilateral lower extremities  Sequential compression devices, below knee Bilateral lower extremitie   -dopplers neg 3. Pain Management: Will increase oxycodone to 15 mg and discontinue prn dilaudid.  4. Mood: LCSW to follow for evaluation and support.  5. Neuropsych: This patient is capable of making decisions on her own behalf.   -neuropsych eval for coping  -xanax prn for anxiety. ?anitdepressant 6. Skin/Wound Care: Turn patient every 2 hours. Pressure relief measures. Maintain adequate hydration and nutritional status.  7. Hyponatremia: 134 yesterday 8. Acute renal failure: push po fluids. Monitor for orthostasis. Labs holding steady 9. Constipation: +BM this am 10. Chest pain---EKG normal  --has hx of chest pain due to hematoma,effusions prior to transfer to rehab, likely musculoskeletal component also.   -rx pain symptomatically as well  -surgery recs noted---consider cards consult  LOS (Days) 3 A FACE TO FACE EVALUATION WAS PERFORMED  SWARTZ,ZACHARY T 07/11/2014 9:11 AM

## 2014-07-11 NOTE — Consult Note (Addendum)
CARDIOLOGY CONSULT NOTE   Patient ID: Charlotte [name] MRN: [id number], DOB/AGE: 04/14/1966   Admit date: 07/08/2014 Date of Consult: 07/11/2014   Primary Physician: No PCP Per Patient Primary Cardiologist: None  Pt. 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  Problem List  Past Medical History  Diagnosis Date  . Hypertension   . Family history of anesthesia complication     "alot of back pains from the epidurals"  . Complication of anesthesia     "alot of back pains from the epidurals"  . Anemia   . Depression   . GERD (gastroesophageal reflux disease)   . Daily headache   . Migraine     "sometimes twice/day" (07/05/2014)  . Stroke 06/29/2014    residual BLE weakness  . 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 Polycystic kidney disease     /notes 07/05/2014  . 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 Arthritis     "shoulders" (07/05/2014)  . Bipolar disorder   . Anxiety     Past Surgical History  Procedure Laterality Date  . Cesarean section  1986  . Tubal ligation  1986  . Spinal drain placement  06/29/2014  . Spinal drain removal  07/02/2014  . Thoracentesis  07/05/2014    [name] 07/05/2014     Allergies  Allergies  Allergen Reactions  . Seroquel [Quetiapine] Other (See Comments)    Night sweats, dizziness and possible confusion  . Olanzapine Nausea And Vomiting    HPI   This 48 year old woman was admitted on 06/29/14 with a3 acute aortic dissection distal to the left subclavian to the aortic bifurcation.  She has a long history of hypertension and has a history of polycystic kidney disease with chronic kidney disease stage III.  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  She does not have any history of ischemic heart disease.  She has a  past history of tobacco abuse.  She had transient atrial fibrillation during her presentation with her acute aortic dissection.  She converted spontaneously back to normal sinus rhythm.  She is not a anticoagulation candidate because of her recent dissection.  On admission she had a spinal drain inserted by anesthesiology to attempt to improve blood flow to her spinal cord.  The spinal drain was subsequently removed prior to her transfer to inpatient rehabilitation.  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 She denies any chest pain at this time although has been complaining of some vague chest discomfort over the past several days. Today she is complaining of nausea and has vomited earlier today. Inpatient Medications  . amoxicillin  250 mg Oral Q12H  . feeding supplement (ENSURE COMPLETE)  237 mL Oral TID BM  . gabapentin  100 mg Oral TID  . hydrALAZINE  75 mg Oral 3 times per day  . labetalol  300 mg Oral BID  . senna-docusate  2 tablet Oral QHS  . sertraline  50 mg Oral Daily    Family History The patient's father died of a stroke.  Social History History   Social History  . Marital Status: Single    Spouse Name: N/A    Number of Children: N/A  . Years of Education: N/A   Occupational  History  . Not on file.   Social History Main Topics  . Smoking status: Current Every Day Smoker -- 1.00 packs/day for 37 years    Types: Cigarettes  . Smokeless tobacco: Never Used  . Alcohol Use: Yes     Comment: 07/05/2014 "recovering alcoholic since 2013"  . Drug Use: Yes    Special: "Crack" cocaine     Comment: 07/05/2014 "recovering cocaine addict since 2013"  . Sexual Activity: Not on file   Other Topics Concern  . Not on file   Social History Narrative  . No narrative on file     Review of Systems  General:  No chills, fever, night sweats or weight changes.  Cardiovascular:  No chest pain, dyspnea on exertion, edema, orthopnea, palpitations,  paroxysmal nocturnal dyspnea. Dermatological: No rash, lesions/masses Respiratory: No cough, dyspnea Urologic: No hematuria, dysuria Abdominal:   No nausea, vomiting, diarrhea, bright red blood per rectum, melena, or hematemesis Neurologic:  No visual changes, wkns, changes in mental status. All other systems reviewed and are otherwise negative except as noted above.  Physical Exam  Blood pressure 132/73, pulse 59, temperature 98.3 F (36.8 C), temperature source Oral, resp. rate 18, height 5\' 5"  (1.651 m), weight 153 lb 6.4 oz (69.582 kg), SpO2 99.00%.  General: Pleasant, NAD Psych: Normal affect. Neuro: Alert and oriented X 3.  Marked weakness of lower she may HEENT: Normal  Neck: Supple without bruits or JVD. Lungs:  Resp regular and unlabored, CTA. Heart: RRR no s3, s4, or murmurs. Abdomen: Soft, non-tender, non-distended, BS + x 4.  Extremities: No clubbing, cyanosis or edema. DP/PT/Radials 2+ and equal bilaterally.  Labs   Recent Labs  07/09/14 0620  CKTOTAL 117  CKMB 2.5  TROPONINI <0.30   Lab Results  Component Value Date   WBC 7.2 07/09/2014   HGB 10.8* 07/09/2014   HCT 32.7* 07/09/2014   MCV 84.3 07/09/2014   PLT 326 07/09/2014     Recent Labs Lab 07/09/14 0620  NA 134*  K 4.4  CL 99  CO2 19  BUN 30*  CREATININE 1.54*  CALCIUM 9.4  PROT 6.7  BILITOT 1.0  ALKPHOS 178*  ALT 117*  AST 80*  GLUCOSE 106*   Lab Results  Component Value Date   CHOL 201* 06/30/2014   HDL 53 06/30/2014   LDLCALC 409* 06/30/2014   TRIG 96 06/30/2014   No results found for this basename: DDIMER    Radiology/Studies  Dg Chest 1 View  07/05/2014   CLINICAL DATA:  Post LEFT thoracentesis  EXAM: CHEST - 1 VIEW  COMPARISON:  07/04/2014  FINDINGS: Minimal enlargement of cardiac silhouette.  Mediastinal contours and pulmonary vascularity normal.  Elevation of LEFT diaphragm with mild bibasilar atelectasis.  Decreased LEFT pleural effusion since preceding exam.  No pneumothorax.   Upper lungs clear.  Bones unremarkable.  IMPRESSION: No pneumothorax following LEFT thoracentesis.  Bibasilar atelectasis with decreased LEFT pleural effusion since previous exam.   Electronically Signed   By: Ulyses Southward M.D.   On: 07/05/2014 10:24   Dg Abd 1 View  07/08/2014   CLINICAL DATA:  Constipation.  Spinal cord ischemia.  EXAM: ABDOMEN - 1 VIEW  COMPARISON:  CT of the abdomen 07/04/2014  FINDINGS: The bowel gas pattern is normal. No radio-opaque calculi or other significant radiographic abnormality are seen. There is moderate stool burden. Visualized osseous structures have a normal appearance.  IMPRESSION: Moderate stool burden.   Electronically Signed   By: Rosalie Gums  M.D.   On: 07/08/2014 18:22   Ct Angio Chest W/cm &/or Wo Cm  06/29/2014   ADDENDUM REPORT: 06/29/2014 20:00  ADDENDUM: Case was reviewed with Dr. Tyrone SageGerhardt at the time of addendum.  A 4 mm penetrating atherosclerotic ulcer is possible along the posterior aspect of the descending thoracic aorta (series 701/image 41). Although very subtle, a slight hyperdense crescent may be present on unenhanced imaging, reflecting intramural hematoma.  As such, while the findings remain compatible with acute aortic syndrome, this may reflect sequela of penetrating atherosclerotic ulcer rather than true aortic dissection.   Electronically Signed   By: Charline BillsSriyesh  Krishnan M.D.   On: 06/29/2014 20:00   06/29/2014   CLINICAL DATA:  Chest/back pain, weakness, nausea. History of renal disorder.  EXAM: CT ANGIOGRAPHY CHEST AND ABDOMEN  TECHNIQUE: Multidetector CT imaging of the chest and abdomen was performed using the standard protocol during bolus administration of intravenous contrast. Multiplanar CT image reconstructions and MIPs were obtained to evaluate the vascular anatomy.  CONTRAST:  100mL OMNIPAQUE IOHEXOL 350 MG/ML SOLN  COMPARISON:  Chest radiograph dated 06/29/2014. Unenhanced CT abdomen pelvis dated 04/20/2009.  FINDINGS: CTA CHEST FINDINGS  No  evidence of intramural hematoma on unenhanced CT.  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  Although not tailored for evaluation of the pulmonary arteries, there is no evidence of pulmonary embolism.  Mild dependent atelectasis at the lung bases. No suspicious pulmonary nodules. No pleural effusion or pneumothorax.  Visualized thyroid is unremarkable.  Heart is normal in size.  No pericardial effusion.  No suspicious mediastinal, hilar, or axillary lymphadenopathy.  Visualized osseous structures are within normal limits.  Review of the MIP images confirms the above findings.  CTA ABDOMEN FINDINGS  Aortic dissection extends to the aortic bifurcation.  Celiac artery, SMA, IMA, and bilateral renal arteries all arise from the true lumen and remain patent.  Scattered hepatic cysts measuring up to 2.1 cm. Liver is otherwise within normal limits.  Spleen, pancreas, and adrenal glands are within normal limits.  Gallbladder is unremarkable. No intrahepatic or extrahepatic ductal dilatation.  Numerous bilateral renal cysts, compatible with polycystic renal disease. No hydronephrosis.  Visualized bowel is unremarkable.  No abdominal ascites.  No suspicious abdominal lymphadenopathy.  Visualized osseous structures are within normal limits.  Review of the MIP images confirms the above findings.  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  Celiac artery, SMA, IMA, and bilateral renal arteries all arise from the true lumen and remain patent.  No evidence of intramural hematoma.  Additional ancillary findings as above.  These results were called by telephone at the time of interpretation on 06/29/2014 at 6:20 pm to Dr. Azalia BilisKEVIN CAMPOS , who verbally acknowledged these results.  Electronically Signed: By: Charline BillsSriyesh  Krishnan M.D. On: 06/29/2014 18:34   Ct Angio Abdomen W/cm &/or Wo  Contrast  06/29/2014   ADDENDUM REPORT: 06/29/2014 20:00  ADDENDUM: Case was reviewed with Dr. Tyrone SageGerhardt at the time of addendum.  A 4 mm penetrating atherosclerotic ulcer is possible along the posterior aspect of the descending thoracic aorta (series 701/image 41). Although very subtle, a slight hyperdense crescent may be present on unenhanced imaging, reflecting intramural hematoma.  As such, while the findings remain compatible with acute aortic syndrome, this may reflect sequela of penetrating atherosclerotic ulcer rather than true aortic dissection.   Electronically Signed   By:  Charline Bills M.D.   On: 06/29/2014 20:00   06/29/2014   CLINICAL DATA:  Chest/back pain, weakness, nausea. History of renal disorder.  EXAM: CT ANGIOGRAPHY CHEST AND ABDOMEN  TECHNIQUE: Multidetector CT imaging of the chest and abdomen was performed using the standard protocol during bolus administration of intravenous contrast. Multiplanar CT image reconstructions and MIPs were obtained to evaluate the vascular anatomy.  CONTRAST:  OMNIPAQUE IOHEXOL 350 MG/ML SOLN  COMPARISON:  Chest radiograph dated 06/29/2014. Unenhanced CT abdomen pelvis dated 04/20/2009.  FINDINGS: CTA CHEST FINDINGS  No evidence of intramural hematoma on unenhanced CT.  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  Although not tailored for evaluation of the pulmonary arteries, there is no evidence of pulmonary embolism.  Mild dependent atelectasis at the lung bases. No suspicious pulmonary nodules. No pleural effusion or pneumothorax.  Visualized thyroid is unremarkable.  Heart is normal in size.  No pericardial effusion.  No suspicious mediastinal, hilar, or axillary lymphadenopathy.  Visualized osseous structures are within normal limits.  Review of the MIP images confirms the above findings.  CTA ABDOMEN FINDINGS  Aortic dissection extends to the aortic bifurcation.  Celiac artery, SMA, IMA, and bilateral  renal arteries all arise from the true lumen and remain patent.  Scattered hepatic cysts measuring up to 2.1 cm. Liver is otherwise within normal limits.  Spleen, pancreas, and adrenal glands are within normal limits.  Gallbladder is unremarkable. No intrahepatic or extrahepatic ductal dilatation.  Numerous bilateral renal cysts, compatible with polycystic renal disease. No hydronephrosis.  Visualized bowel is unremarkable.  No abdominal ascites.  No suspicious abdominal lymphadenopathy.  Visualized osseous structures are within normal limits.  Review of the MIP images confirms the above findings.  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  Celiac artery, SMA, IMA, and bilateral renal arteries all arise from the true lumen and remain patent.  No evidence of intramural hematoma.  Additional ancillary findings as above.  These results were called by telephone at the time of interpretation on 06/29/2014 at 6:20 pm to Dr. Azalia Bilis , who verbally acknowledged these results.  Electronically Signed: By: Charline Bills M.D. On: 06/29/2014 18:34   Dg Chest Port 1 View  07/09/2014   CLINICAL DATA:  Chest pain and shortness of Breath.  EXAM: PORTABLE CHEST - 1 VIEW  COMPARISON:  07/06/2014  FINDINGS: Mild basilar opacity, greater on the left, is consistent with atelectasis. No significant pleural fluid. No pneumothorax. No evidence of pulmonary edema or pneumonia.  Cardiac silhouette is normal in size. No mediastinal or hilar masses.  IMPRESSION: Mild stable basilar atelectasis, greater on the left. No evidence of pneumonia or edema. No pneumothorax. No change from the prior study.   Electronically Signed   By: Amie Portland M.D.   On: 07/09/2014 08:12   Dg Chest Port 1 View  07/06/2014   CLINICAL DATA:  Left-sided chest pain, recent thoracentesis  EXAM: PORTABLE CHEST - 1 VIEW  COMPARISON:  Chest radiograph 8 14 2015   FINDINGS: No evidence of left-sided pneumothorax. There is interval decrease in pleural fluid on the left. Decrease in atelectasis. Right lung is clear.  IMPRESSION: No complication following left thoracentesis. No pneumothorax. Improved aeration left lung base.   Electronically Signed   By: Genevive Bi M.D.   On: 07/06/2014 19:14   Dg Chest [facility]  07/04/2014   CLINICAL DATA:  Reassess atelectasis  EXAM: PORTABLE CHEST - 1 VIEW  COMPARISON:  . Chest CT scan of June 29, 2014 portable chest x-ray of the same date  FINDINGS: The lungs are less well inflated today. The interstitial markings are mildly increased especially on the left. The retrocardiac region is dense consistent with atelectasis or pneumonia. The left hemidiaphragm is now obscured. The cardiac silhouette is mildly enlarged. The aortic knob is indistinct consistent with the known type B aortic dissection. The pulmonary vascularity is mildly engorged.  IMPRESSION: 1. There is left lower lobe atelectasis and/or pneumonia. 2. Increased pulmonary interstitial markings and mild cardiomegaly are consistent with CHF. 3. Indistinct aortic knob is consistent with the known aortic dissection.   Electronically Signed   By: David  [place]   On: 07/04/2014 08:05   Dg Chest Port 1 View  06/29/2014   CLINICAL DATA:  Chest pain  EXAM: PORTABLE CHEST - 1 VIEW  COMPARISON:  04/13/2010  FINDINGS: Lungs are clear.  No pleural effusion or pneumothorax.  The heart is normal in size.  IMPRESSION: No evidence of acute cardiopulmonary disease.   Electronically Signed   By: Charline Bills M.D.   On: 06/29/2014 17:16   Ct Angio Chest Aortic Dissect W &/or W/o  07/04/2014   CLINICAL DATA:  Type 3 aortic dissection.  Worsening chest pain.  EXAM: CT ANGIOGRAPHY CHEST, ABDOMEN AND PELVIS  TECHNIQUE: Multidetector CT imaging through the chest, abdomen and pelvis was performed using the standard protocol during bolus administration of intravenous contrast.  Multiplanar reconstructed images and MIPs were obtained and reviewed to evaluate the vascular anatomy.  CONTRAST:  OMNIPAQUE IOHEXOL 350 MG/ML SOLN  COMPARISON:  06/29/2014.  FINDINGS: CTA CHEST FINDINGS  Small intramural hematoma is identified on precontrast imaging. This was present on the prior exam although it is better visualized due to technique on today's study. The intramural hematoma extends from the aortic arch to the upper abdomen.  Intramural hematoma appears slightly decreased in thickness compared to the prior exam 06/29/2014. The intramural hematoma extends from the arch into the upper abdomen. There is no visible dissection flap. The small area of proximal descending thoracic aortic contrast suspicious for penetrating atherosclerotic ulcer is no longer visible on today's study.  In the interval since the prior exam, moderate to large LEFT and small RIGHT pleural effusions have developed with atelectasis. The pleural effusions are low-attenuation. There is no hemopericardium or hemothorax in this patient with acute aortic syndrome. Heart appears mildly enlarged and increased in size compared to prior, compatible with volume overload. The lungs show compressive atelectasis but no areas of consolidation. Coronary artery atherosclerosis is present. If office based assessment of coronary risk factors has not been performed, it is now recommended.  Review of the MIP images confirms the above findings.  CTA ABDOMEN AND PELVIS FINDINGS  Abdominal aortic atherosclerosis without aneurysm. No dissection flap is identified in the abdominal aorta. Bilateral iliofemoral mild atherosclerosis. No occlusion. Celiac axis, superior mesenteric artery and inferior mesenteric artery are patent.  Polycystic kidney disease is present with associated liver cysts. There is a large calculus in the LEFT inferior renal pole measuring 13 mm. This may be contained within a cyst or an a partially obstructed collecting system  or calyceal diverticulum. Bowel appears within normal limits. Liver and spleen normal. Small amount of ascites, probably secondary to volume overload. Mild anasarca changes are present in the subcutaneous tissues.  Review of the MIP images confirms  the above findings.  IMPRESSION: 1. Evolving descending thoracic aortic intramural hematoma. Mildly decreased intramural hematoma evident on noncontrast imaging. Penetrating atherosclerotic ulcer is no longer visualized. No dissection flap. 2. Development of moderate to large LEFT pleural effusion and small RIGHT pleural effusion. Both effusions layer dependently and produce substantial compressive atelectasis. 3. Polycystic kidney disease with associated liver cysts.   Electronically Signed   By: Andreas Newport M.D.   On: 07/04/2014 16:08   Ct Angio Abd/pel W/ And/or W/o  07/04/2014   CLINICAL DATA:  Type 3 aortic dissection.  Worsening chest pain.  EXAM: CT ANGIOGRAPHY CHEST, ABDOMEN AND PELVIS  TECHNIQUE: Multidetector CT imaging through the chest, abdomen and pelvis was performed using the standard protocol during bolus administration of intravenous contrast. Multiplanar reconstructed images and MIPs were obtained and reviewed to evaluate the vascular anatomy.  CONTRAST:  OMNIPAQUE IOHEXOL 350 MG/ML SOLN  COMPARISON:  06/29/2014.  FINDINGS: CTA CHEST FINDINGS  Small intramural hematoma is identified on precontrast imaging. This was present on the prior exam although it is better visualized due to technique on today's study. The intramural hematoma extends from the aortic arch to the upper abdomen.  Intramural hematoma appears slightly decreased in thickness compared to the prior exam 06/29/2014. The intramural hematoma extends from the arch into the upper abdomen. There is no visible dissection flap. The small area of proximal descending thoracic aortic contrast suspicious for penetrating atherosclerotic ulcer is no longer visible on today's study.  In the  interval since the prior exam, moderate to large LEFT and small RIGHT pleural effusions have developed with atelectasis. The pleural effusions are low-attenuation. There is no hemopericardium or hemothorax in this patient with acute aortic syndrome. Heart appears mildly enlarged and increased in size compared to prior, compatible with volume overload. The lungs show compressive atelectasis but no areas of consolidation. Coronary artery atherosclerosis is present. If office based assessment of coronary risk factors has not been performed, it is now recommended.  Review of the MIP images confirms the above findings.  CTA ABDOMEN AND PELVIS FINDINGS  Abdominal aortic atherosclerosis without aneurysm. No dissection flap is identified in the abdominal aorta. Bilateral iliofemoral mild atherosclerosis. No occlusion. Celiac axis, superior mesenteric artery and inferior mesenteric artery are patent.  Polycystic kidney disease is present with associated liver cysts. There is a large calculus in the LEFT inferior renal pole measuring 13 mm. This may be contained within a cyst or an a partially obstructed collecting system or calyceal diverticulum. Bowel appears within normal limits. Liver and spleen normal. Small amount of ascites, probably secondary to volume overload. Mild anasarca changes are present in the subcutaneous tissues.  Review of the MIP images confirms the above findings.  IMPRESSION: 1. Evolving descending thoracic aortic intramural hematoma. Mildly decreased intramural hematoma evident on noncontrast imaging. Penetrating atherosclerotic ulcer is no longer visualized. No dissection flap. 2. Development of moderate to large LEFT pleural effusion and small RIGHT pleural effusion. Both effusions layer dependently and produce substantial compressive atelectasis. 3. Polycystic kidney disease with associated liver cysts.   Electronically Signed   By: Andreas Newport M.D.   On: 07/04/2014 16:08   US Thoracentesis  Asp Pleural Space W/img Guide  07/05/2014   CLINICAL DATA:  Left pleural effusion  EXAM: ULTRASOUND GUIDED left THORACENTESIS  COMPARISON:  None.  PROCEDURE: An ultrasound guided thoracentesis was thoroughly discussed with the patient and questions answered. The benefits, risks, alternatives and complications were also discussed. The patient understands and wishes to  proceed with the procedure. Written consent was obtained.  Ultrasound was performed to localize and mark an adequate pocket of fluid in the left chest. The area was then prepped and draped in the normal sterile fashion. 1% Lidocaine was used for local anesthesia. Under ultrasound guidance a 19 gauge Yueh catheter was introduced. Thoracentesis was performed. The catheter was removed and a dressing applied.  Complications:  None.  FINDINGS: A total of approximately 300 cc of blood tinged fluid was removed. A fluid sample wassent for laboratory analysis.  IMPRESSION: Successful ultrasound guided left thoracentesis yielding 300 cc of pleural fluid.  Read by:  Robet Leu [facility]   Electronically Signed   By: Irish Lack M.D.   On: 07/05/2014 11:51    ECG  Normal sinus rhythm Left ventricular hypertrophy with repolarization abnormality Nonspecific ST abnormality No significant change since last tracing 06/30/14  2-D echo on 06/30/14: - Left ventricle: The cavity size was normal. There was moderate concentric hypertrophy. Systolic function was normal. The estimated ejection fraction was in the range of 60% to 65%. Wall motion was normal; there were no regional wall motion abnormalities. Doppler parameters are consistent with abnormal left ventricular relaxation (grade 1 diastolic dysfunction). The E/e&' ratio is <8, suggesting normal LV filling pressure. - Aorta: No clear dissection flap visualized in this study. The aortic arch size is generous, the descending aorta is well visualized and no evidence for dissection is noted. - Left  atrium: The atrium was normal in size. - Tricuspid valve: There was mild regurgitation. - Pulmonary arteries: PA peak pressure: 26 mm Hg (S). - Pericardium, extracardiac: There was no pericardial effusion.  ASSESSMENT AND PLAN  1. 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 2. hypertensive heart disease 3. polycystic kidney disease with chronic kidney disease stage III 4. prior tobacco abuse 5. hyperlipidemia with LDL cholesterol 129.  6. Recent chest discomfort does not sound ischemic but will update EKG.  Plan: The patient will need lifelong antihypertensive therapy including beta blocker.  Presently blood pressure is controlled on current therapy with labetalol and Apresoline. Continue risk factor modification with tobacco abuse counseling.  Will also add statin therapy for her elevated cholesterol.  Continue inpatient rehabilitation services for restoration of lower extremity function. Will update EKG today.  Signed, Cassell Clement, MD  07/11/2014, 12:11 PM

## 2014-07-11 NOTE — IPOC Note (Signed)
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     Functional Problem List: Nursing Bladder;Bowel;Endurance;Medication Management;Nutrition;Pain;Perception;Safety;Sensory  PT Balance;Endurance;Motor;Pain;Perception;Sensory  OT Behavior;Endurance;Pain;Safety;Motor;Balance  SLP    TR         Basic ADL's: OT Bathing;Dressing;Toileting     Advanced  ADL's: OT       Transfers: PT Bed Mobility;Bed to Chair;Car;Furniture  OT Toilet;Tub/Shower     Locomotion: PT Ambulation;Wheelchair Mobility;Stairs     Additional Impairments: OT    SLP        TR      Anticipated Outcomes Item Anticipated Outcome  Self Feeding Supervision  Swallowing      Basic self-care  Min A  Toileting  Supervision   Bathroom Transfers Supervision  Bowel/Bladder  Manage bowel and bladder program with max assist  Transfers  Supervision  Locomotion  mod (I) from w/c level w/ S for short distance ambulation  Communication     Cognition     Pain  5 orless  Safety/Judgment  Mod assist   Therapy Plan: PT Intensity: Minimum of 1-2 x/day ,45 to 90 minutes PT Frequency: 5 out of 7 days PT Duration Estimated Length of Stay: 2-3 weeks OT Intensity: Minimum of 1-2 x/day, 45 to 90 minutes OT Frequency: 5 out of 7 days OT Duration/Estimated Length of Stay: 2-3 wks         Team Interventions: Nursing Interventions Patient/Family Education;Bladder Management;Bowel Management;Disease Management/Prevention;Pain Management;Medication Management;Discharge Planning  PT interventions Balance/vestibular training;Discharge planning;DME/adaptive equipment instruction;Neuromuscular re-education;Functional mobility training;Pain management;Patient/family education;Psychosocial support;Stair training;Therapeutic  Activities;Therapeutic Exercise;UE/LE Coordination activities;UE/LE Strength taining/ROM  OT Interventions Balance/vestibular training;Therapeutic Activities;Therapeutic Exercise;UE/LE Strength taining/ROM;Wheelchair propulsion/positioning;Self Care/advanced ADL retraining;Pain management;Patient/family Clinical research associateeducation;DME/adaptive equipment instruction;Discharge planning  SLP Interventions    TR Interventions    SW/CM Interventions Discharge Planning;Psychosocial Support;Patient/Family Education    Team Discharge Planning: Destination: PT-Home ,OT- Home , SLP-  Projected Follow-up: PT-Home health PT, OT-  Home health OT, SLP-  Projected Equipment Needs: PT-Wheelchair (measurements);Wheelchair cushion (measurements);To be determined, OT- To be determined, SLP-  Equipment Details: PT-possible need for ambulation AD, TBD, OT-  Patient/family involved in discharge planning: PT- Patient,  OT-Patient;Family member/caregiver, SLP-   MD ELOS: 17-20 days Medical Rehab Prognosis:  Excellent Assessment: The patient has been admitted for CIR therapies with the diagnosis of spinal cord infarct. The team will be addressing functional mobility, strength, stamina, balance, safety, adaptive techniques and equipment, self-care, bowel and bladder mgt, patient and caregiver education, NMR, coping/adjustment, pain mgt.. Goals have been set at mod I to supervision for most mobility and self-care tasks. She likely will be able to achieve a supervision level with short dx ambulation.    Ranelle OysterZachary T. Swartz, MD, FAAPMR      See Team Conference Notes for weekly updates to the plan of care

## 2014-07-11 NOTE — Progress Notes (Signed)
Social Work  Social Work Assessment and Plan  Patient Details  Name: Charlotte [name] MRN: [id number] Date of Birth: 03-Sep-1966  Today's Date: 07/11/2014  Problem List:  Patient Active Problem List   Diagnosis Date Noted  . 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 Polycystic kidney disease 06/29/2014  . Aortic dissection distal to left subclavian 06/29/2014  . 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 Acute infarction of spinal cord 06/29/2014  . 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 Aortic dissection 06/29/2014  . ANEMIA 02/09/2011  . TOBACCO ABUSE 02/09/2011  . COCAINE ABUSE 02/09/2011  . HYPERTENSION, ESSENTIAL 02/09/2011  . NAUSEA 02/09/2011   Past Medical History:  Past Medical History  Diagnosis Date  . Hypertension   . Family history of anesthesia complication     "alot of back pains from the epidurals"  . Complication of anesthesia     "alot of back pains from the epidurals"  . Anemia   . Depression   . GERD (gastroesophageal reflux disease)   . Daily headache   . Migraine     "sometimes twice/day" (07/05/2014)  . Stroke 06/29/2014    residual BLE weakness  . 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 Polycystic kidney disease     /notes 07/05/2014  . 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 Arthritis     "shoulders" (07/05/2014)  . Bipolar disorder   . Anxiety    Past Surgical History:  Past Surgical History  Procedure Laterality Date  . Cesarean section  1986  . Tubal ligation  1986  . Spinal drain placement  06/29/2014  . Spinal drain removal  07/02/2014  . Thoracentesis  07/05/2014    [name] 07/05/2014   Social History:  reports that she has been smoking Cigarettes.  She has a 37 pack-year smoking history. She has never used smokeless tobacco. She reports that she drinks alcohol.  She reports that she uses illicit drugs ("Crack" cocaine).  Family / Support Systems Marital Status: Single Patient Roles: Parent (Has 2 daughters locally and 1 son who is incarcerated) Children: daughter, Rhilee Currin @ [phone] who is primary caregiver to her sister, Crystal who has CP but independently mobile. Other Supports: pt's mother, Lashondra Vaquerano - local and supportive;  pt also has 4 siblings living locally and daughter notes her aunt and grandmother will provide assistance as well. Anticipated Caregiver: [name], daughter and family Ability/Limitations of Caregiver: Dtr [name] is not working and can provide care. Caregiver Availability: 24/7 Family Dynamics: Daughters appear very involved and supportive with Ashely being the primary Mining engineer" of situation and looking after her sister.  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 Legal Hisotry/Current Legal Issues: None Guardian/Conservator: None - per MD, pt is capable of making decisions on her own behalf   Abuse/Neglect Physical Abuse: Denies Verbal Abuse: Denies Sexual Abuse: Denies Exploitation of patient/patient's resources: Denies Self-Neglect: Denies  Emotional Status Pt's affect, behavior adn adjustment status: pt lying in bed and appears to be in discomfort.  She reports that she is having  GI troupble following meals.  Daughter reports and pt agrees that she is experiencing much depression since this CVA.  Pt having difficulty seeing any improvement in her overall function.  Have placed her on schedule to be seen by neuropsychology to process through these feelings.  I will follow with pt  as well for support. Recent Psychosocial Issues: Financial and employment difficulties for a long period of time.  Family has been able to connect with local support programs and are managing well overall.   Pyschiatric History: Daughter reports that pt has been diagnosed with Bipolar Dz and [facility], [facility]  2x/month for counseling and approx q2 month for med management.  Daughter repots pt was taking Zoloft and Seroquel.  Patient / Family Perceptions, Expectations & Goals Pt/Family understanding of illness & functional limitations: Pt and daughter with only basic understanding that pt suffered "...something with her heart which caused the blood flow to damage her spinal cord"  leading to paraplegia.  Both admit they are very unsure what to expect with overall recovery.  Education will be needed ongoing. Premorbid pt/family roles/activities: Pt was completely independent PTA.  Does not drive so would eithr walk or use bus for transportation. Anticipated changes in roles/activities/participation: Pt will require hands on assistance at d/c and will likely be functioning from a w/c level.  Daughters to assume primary caregiver roles for pt. Pt/family expectations/goals: Pt has very difficult time identifying any goals.  "I don't know."  Family hopeful that "she'll walk again"  [employer] Community Agencies: Other (Comment) Engineer, maintenance (IT)(Food Stamps, Housing Auth, Armed forces operational officerLegal Aide, [facility] and [facility]) Premorbid Home Care/DME Agencies: None Transportation available at discharge: yes - will refer to [facility] until MA is approved and can use that transport. Resource referrals recommended: Neuropsychology;Support group (specify)  Discharge Planning Living Arrangements: Alone Support Systems: Children;Other relatives Type of Residence: Private residence Insurance Resources: Customer service managerelf-pay Financial Resources: Other (Comment)  (Housing and Utility assist;  Sales executiveood Stamps) Financial Screen Referred: Previously completed Living Expenses:  (Qualifies for free rent) Money Management: Patient;Family Does the patient have any problems obtaining your medications?: Yes (Describe) (no insurance per dtr - will educate on being able to assess med assist via TAPM if dtr not aware) Home Management: pt Patient/Family Preliminary Plans: Pt plans to d/c to daughter's apt which is a first floor apt.  [name] to be primary caregiver. Social Work Anticipated Follow Up Needs: HH/OP;Other (comment) (willl connect with pt's current programs and add others i.e. [facility] as identified) Expected length of stay: 15-18 days  Clinical Impression Very unfortunate woman here following SCI infarct.  Supportive daughters, however, one with CP (physically independent) and other is providing management to her as well.  Limited school/ reading and writing comprehension as well as mental health h/o bipolar.  Pt emotionally struggling with her loss of function and concerns over chronic financial limitations.  Is already connected to many local community resources but will investigate if other programs might be beneficial to refer.  Have already recommended neuropsych consult as well for support.  Stephannie Broner 07/11/2014, 6:30 PM

## 2014-07-12 ENCOUNTER — Encounter (HOSPITAL_COMMUNITY): Payer: Self-pay

## 2014-07-12 ENCOUNTER — Inpatient Hospital Stay (HOSPITAL_COMMUNITY): Payer: Medicaid Other | Admitting: *Deleted

## 2014-07-12 ENCOUNTER — Inpatient Hospital Stay (HOSPITAL_COMMUNITY): Payer: Medicaid Other | Admitting: Physical Therapy

## 2014-07-12 DIAGNOSIS — I7100 Dissection of unspecified site of aorta: Secondary | ICD-10-CM

## 2014-07-12 MED ORDER — HYDROCHLOROTHIAZIDE 12.5 MG PO CAPS
12.5000 mg | ORAL_CAPSULE | Freq: Every day | ORAL | Status: DC
  Administered 2014-07-12 – 2014-07-25 (×14): 12.5 mg via ORAL
  Filled 2014-07-12 (×15): qty 1

## 2014-07-12 MED ORDER — FAMOTIDINE 20 MG PO TABS
20.0000 mg | ORAL_TABLET | Freq: Two times a day (BID) | ORAL | Status: DC
  Administered 2014-07-12 – 2014-07-24 (×24): 20 mg via ORAL
  Administered 2014-07-24: 20:00:00 via ORAL
  Administered 2014-07-25: 20 mg via ORAL
  Filled 2014-07-12 (×29): qty 1

## 2014-07-12 NOTE — Progress Notes (Signed)
Occupational Therapy Session Note  Patient Details  Name: Charlotte [name] MRN: [id number] Date of Birth: Apr 11, 1966  Today's Date: 07/12/2014  Pt missed 60 mins skilled OT services.  Pt resting in bed upon arrival and stated that she had been vomiting and was sick and could not do any therapy.  RN notified and entered room to encourage patient to participate.  RN indicated to OT that patient is unable to participate at this time.  Short Term Goals: Week 1:  OT Short Term Goal 1 (Week 1): Pt will perform upper body bathing and dressing with setup assist OT Short Term Goal 2 (Week 1): Pt will complete lower body bathing supine using AE with mod assist OT Short Term Goal 3 (Week 1): Pt will complete transfer on/off tub bench with mod assist OT Short Term Goal 4 (Week 1): Pt will complete lower body dressing with min assist OT Short Term Goal 5 (Week 1): Pt will complete toileting with min assist   Therapy Documentation Precautions:  Precautions Precautions: Fall Restrictions Weight Bearing Restrictions: No General: General OT Amount of Missed Time: 60 Minutes Pain: Pain Assessment Pain Assessment: 0-10 Pain Score: 5  Faces Pain Scale: Hurts a little bit Pain Type: Chronic pain Pain Location: Chest Pain Orientation: Mid Pain Descriptors / Indicators: Aching Pain Onset: On-going Pain Intervention(s): Repositioned;Rest Multiple Pain Sites: No  See FIM for current functional status  Therapy/Group: Individual Therapy  Rich BraveLanier, Jamilah Jean Chappell 07/12/2014, 11:24 AM

## 2014-07-12 NOTE — Progress Notes (Signed)
[place] PHYSICAL MEDICINE & REHABILITATION     PROGRESS NOTE    Subjective/Complaints: Chest still sore. Able to work through therapies. Slept fairly well last night A  review of systems has been performed and if not noted above is otherwise negative.    Objective: Vital Signs: Blood pressure 156/72, pulse 70, temperature 98.4 F (36.9 C), temperature source Oral, resp. rate 18, height 5\' 5"  (1.651 m), weight 69.582 kg (153 lb 6.4 oz), SpO2 95.00%. No results found. No results found for this basename: WBC, HGB, HCT, PLT,  in the last 72 hours No results found for this basename: NA, K, CL, CO, GLUCOSE, BUN, CREATININE, CALCIUM,  in the last 72 hours CBG (last 3)  No results found for this basename: GLUCAP,  in the last 72 hours  Wt Readings from Last 3 Encounters:  07/10/14 69.582 kg (153 lb 6.4 oz)  07/07/14 66.588 kg (146 lb 12.8 oz)  02/09/11 59.648 kg (131 lb 8 oz)    Physical Exam:  Constitutional: [name], place, and time. She appears well-developed and well-nourished.  HENT: oral mucosa moist Head: Normocephalic and atraumatic.  Eyes: Conjunctivae are normal. Pupils are equal, round, and reactive to light.  Neck: Normal range of motion. Neck supple.  Cardiovascular: Regular rhythm and rate  Respiratory: Effort normal and breath sounds normal. No respiratory distress. She has no wheezes. No rales GI: Soft. Bowel sounds are normal. She exhibits no distension. There is no tenderness.  Musculoskeletal: She exhibits no edema. Tenderness to palpation over sternum and xyphoid Neurological: She is alert and oriented to person, place, and time.  LLE>RLE weakness.  2 to 2+/5 with HF/KE,  2+/5 PF/DF.  Upper extremities strength 4+/5 deltoid, bicep, tricep, grip  Diminished sensation to PP/LT from breasts distally, 1+/2  Skin: Skin is warm and dry.  Psychiatric: more pleasant and up beat.   Assessment/Plan: 1. Functional deficits secondary to thoracic  spinal cord infarct which requires 3+ hours per day of interdisciplinary therapy in a comprehensive inpatient rehab setting. Physiatrist is providing close team supervision and 24 hour management of active medical problems listed below. Physiatrist and rehab team continue to assess barriers to discharge/monitor patient progress toward functional and medical goals. FIM: FIM - Bathing Bathing Steps Patient Completed: Chest;Right Arm;Left Arm;Abdomen Bathing: 2: Max-Patient completes 3-4 23f 10 parts or 25-49%  FIM - Upper Body Dressing/Undressing Upper body dressing/undressing steps patient completed: Thread/unthread right sleeve of pullover shirt/dresss;Thread/unthread left sleeve of pullover shirt/dress;Put head through opening of pull over shirt/dress;Thread/unthread left sleeve of front closure shirt/dress;Thread/unthread right sleeve of front closure shirt/dress;Pull shirt over trunk Upper body dressing/undressing: 5: Supervision: Safety issues/verbal cues FIM - Lower Body Dressing/Undressing Lower body dressing/undressing steps patient completed: Thread/unthread left pants leg Lower body dressing/undressing: 1: Total-Patient completed less than 25% of tasks  FIM - Toileting Toileting: 1: Total-Patient completed zero steps, helper did all 3  FIM - Diplomatic Services operational officer Devices: Psychiatrist Transfers: 3-To toilet/BSC: Mod A (lift or lower assist);3-From toilet/BSC: Mod A (lift or lower assist)  FIM - Bed/Chair Transfer Bed/Chair Transfer Assistive Devices: Arm rests Bed/Chair Transfer: 3: Sit > Supine: Mod A (lifting assist/Pt. 50-74%/lift 2 legs);3: Bed > Chair or W/C: Mod A (lift or lower assist)  FIM - Locomotion: Wheelchair Distance: 45 Locomotion: Wheelchair: 0: Activity did not occur FIM - Locomotion: Ambulation Locomotion: Ambulation: 0: Activity did not occur  Comprehension Comprehension Mode: Auditory Comprehension: 5-Understands basic 90%  of the time/requires cueing <  10% of the time  Expression Expression Mode: Verbal Expression: 5-Expresses basic 90% of the time/requires cueing < 10% of the time.  Social Interaction Social Interaction: 3-Interacts appropriately 50 - 74% of the time - May be physically or verbally inappropriate.  Problem Solving Problem Solving: 5-Solves basic 90% of the time/requires cueing < 10% of the time  Memory Memory: 5-Recognizes or recalls 90% of the time/requires cueing < 10% of the time  Medical Problem List and Plan:  1. 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 DVT Prophylaxis/Anticoagulation: Mechanical: Antiembolism stockings, knee (TED hose) Bilateral lower extremities  Sequential compression devices, below knee Bilateral lower extremitie   -dopplers neg 3. Pain Management: Will increase oxycodone to 15 mg and discontinue prn dilaudid.  4. Mood: LCSW to follow for evaluation and support.  5. Neuropsych: This patient is capable of making decisions on her own behalf.   -neuropsych eval for coping  -xanax prn for anxiety.  -zoloft    -+ reinforcement by team 6. Skin/Wound Care: Turn patient every 2 hours. Pressure relief measures. Maintain adequate hydration and nutritional status.  7. Hyponatremia: 134  8. Acute renal failure: push po fluids. Monitor for orthostasis. Labs holding steady 9. Constipation: +BM recently---am suppository 10. Chest pain---EKG normal  --has hx of chest pain due to hematoma,effusions prior to transfer to rehab, likely musculoskeletal component also.   -rx pain symptomatically as well  -appreciate cards input and recs  LOS (Days) 4 A FACE TO FACE EVALUATION WAS PERFORMED  Alianys Chacko T 07/12/2014 8:56 AM

## 2014-07-12 NOTE — Progress Notes (Signed)
Occupational Therapy Session Note  Patient Details  Name: Charlotte RobertHelene J [name] MRN: [id number] Date of Birth: 12/12/1965  Today's Date: 07/12/2014 OT Individual Time: 1400-1500 OT Individual Time Calculation (min): 60 min   Short Term Goals: Week 1:  OT Short Term Goal 1 (Week 1): Pt will perform upper body bathing and dressing with setup assist OT Short Term Goal 2 (Week 1): Pt will complete lower body bathing supine using AE with mod assist OT Short Term Goal 3 (Week 1): Pt will complete transfer on/off tub bench with mod assist OT Short Term Goal 4 (Week 1): Pt will complete lower body dressing with min assist OT Short Term Goal 5 (Week 1): Pt will complete toileting with min assist     Skilled Therapeutic Interventions/Progress Updates:    Pt. Lying in bed upon OT arrival.  Mom and stepdad present.  Pt. Reported she had been nauseated all day and did not feel like doing therapy.  Encouraged pt to try to sit EOB with therapy for a bit to facilitate her strength and endurance.  Addressed bed mobility, sitting balance, and  Therapeutic exercises.  Assisted pt with donning LB clothes.  Pt. Went from supine to EOB with SBA using rails.  She sat EOB and performed 1 set of 10  leg kicks AROM with RLE and AAROM with LLE.   Pt. Sat with SBA and was given much praise for sitting with no assistance.  Pt's breathing started increasing.  Placed pt in supine.  Vitals:  150/73, oxygen= 94%.  Pt. Continued to hyper ventilate.  Mom  Called RN to room.  Vitals were re taken and no changes.  RN gave pt anti anxiety meds  (see MAR).  Pt's breathing rate calmed down even before meds were issued.  Pt  Very proud of her sitting tolerance and balance.  Left pt in room with all needs in place.    Therapy Documentation Precautions:  Precautions Precautions: Fall Restrictions Weight Bearing Restrictions: No General: General OT Amount of Missed Time: 60 Minutes Vital Signs: Therapy Vitals Pulse Rate: 87 BP: 137/64  mmHg Patient Position (if appropriate): Lying Pain:   ADL: ADL ADL Comments: see FIM Exercises:   Other Treatments:    See FIM for current functional status  Therapy/Group: Individual Therapy  Humberto Sealsdwards, Ohana Birdwell J 07/12/2014, 3:07 PM

## 2014-07-12 NOTE — Progress Notes (Signed)
Physical Therapy Session Note  Patient Details  Name: Charlotte [name] MRN: [id number] Date of Birth: [birth year]  Today's Date: 07/12/2014 PT Individual Time: 0830-0930 PT Individual Time Calculation (min): 60 min   Short Term Goals: Week 1:  PT Short Term Goal 1 (Week 1): Pt will tolerate 60 minutes of therapeutic activity PT Short Term Goal 2 (Week 1): Pt will perform all bed mobility w/ MinA PT Short Term Goal 3 (Week 1): Pt will tolerate sitting up in wheelchair for 3 hrs at a time PT Short Term Goal 4 (Week 1): Pt will consistently transfer bed<>w/c w/ MinA PT Short Term Goal 5 (Week 1): Pt will perform sit<>stand w/ ModA  Skilled Therapeutic Interventions/Progress Updates:    Therapeutic Activity: PT instructs pt in rolling L in bed req SBA and verbal cues, L side lie to sit req mod A for B LEs and trunk assist, scoot transfer bed to w/c to R without SB req CGA and verbal cues for technique and min A scoot transfer w/c to bed after pt vomited. Pt req mod A for B LE sit to supine transfer. RN notified that pt vomited at end of PT session.   W/C Management: PT instructs pt in w/c propulsion with B UEs x 150' req SBA and verbal cues for technique. PT instructs pt in ascending/descending ramp in w/c req min A and verbal cues for technique x 2 reps. Pt verbalizes anxiety with descending the ramp.   Neuromuscular Reeducation: PT instructs pt in lateral weight shifts in standing frame x 20 reps and very mini squats x 8 reps req min A for harness to stay in place. Pt begins to feel lightheaded/dizzy and so she returned to sitting; BP taken in sitting and reading is 106/72. Pt rested and symptoms returned to normal and standing frame exercises done a second time.   Therapeutic Exercise: PT instructs pt in seated exercises for strengthening A/AROM as needed: LAQ, march, hip adduction/abduction, x 10 reps each  Pt more agreeable to participate in PT today's session. Pt is progressing in w/c  management, but will benefit from continued standing exercises so body can begin to regulate blood pressure. PT will benefit from continued bed mobility and transfer training, strengthening exercises to B LEs, and progression towards standing and ambulation with body weight support, as is safe.    Therapy Documentation Precautions:  Precautions Precautions: Fall Restrictions Weight Bearing Restrictions: No    Vital Signs: Therapy Vitals Temp: 98.4 F (36.9 C) Temp src: Oral Pulse Rate: 70 BP: 156/72 mmHg Patient Position (if appropriate): Lying Oxygen Therapy SpO2: 95 % O2 Device: None (Room air) Pain: Pain Assessment Pain Assessment: 0-10 Pain Score: 5  Pain Type: Chronic pain Pain Location: Chest Pain Orientation: Mid Pain Descriptors / Indicators: Aching Pain Onset: On-going Pain Intervention(s): Repositioned;Rest Multiple Pain Sites: No  See FIM for current functional status  Therapy/Group: Individual Therapy  Orton Capell M 07/12/2014, 8:39 AM

## 2014-07-12 NOTE — Progress Notes (Signed)
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 Still has LLE weakness. No significant SOB. Have morning nausea.  CURRENT MEDS . amoxicillin  250 mg Oral Q12H  . atorvastatin  20 mg Oral q1800  . bisacodyl  10 mg Rectal Q0600  . feeding supplement (ENSURE COMPLETE)  237 mL Oral TID BM  . gabapentin  100 mg Oral TID  . hydrALAZINE  75 mg Oral 3 times per day  . labetalol  300 mg Oral BID  . senna-docusate  2 tablet Oral QHS  . sertraline  50 mg Oral Daily    OBJECTIVE  Filed Vitals:   07/11/14 1939 07/11/14 2118 07/12/14 0518 07/12/14 0833  BP: 154/66 170/70  156/72  Pulse: 73 92  70  Temp: 98.9 F (37.2 C)  98.4 F (36.9 C)   TempSrc: Oral  Oral   Resp: 18     Height:      Weight:      SpO2: 94%  96% 95%    Intake/Output Summary (Last 24 hours) at 07/12/14 1125 Last data filed at 07/12/14 0800  Gross per 24 hour  Intake    740 ml  Output      0 ml  Net    740 ml   Filed Weights   07/08/14 1700 07/10/14 1014  Weight: 149 lb 9.6 oz (67.858 kg) 153 lb 6.4 oz (69.582 kg)    PHYSICAL EXAM  General: Pleasant, NAD. Neuro: Alert and oriented X 3. Moves all extremities spontaneously. Psych: Normal affect. HEENT:  Normal  Neck: Supple without bruits or JVD. Lungs:  Resp regular and unlabored, CTA. Heart: RRR no s3, s4, or murmurs. Abdomen: Soft, non-tender, non-distended, BS + x 4.  Extremities: No clubbing, cyanosis or edema. DP/PT/Radials 2+ and equal bilaterally.  Accessory Clinical Findings   ECG  NSR without significant ST-T wave changes  Echocardiogram  06/30/2014  LV EF: 60% - 65%  ------------------------------------------------------------------- Indications: Aortic Dissection Distal To the Left  Subclavion (441).  ------------------------------------------------------------------- History: Risk factors: Current tobacco use. Hypertension.  ------------------------------------------------------------------- Study Conclusions  - Left ventricle: The cavity size was normal. There was moderate concentric hypertrophy. Systolic function was normal. The estimated ejection fraction was in the range of 60% to 65%. Wall motion was normal; there were no regional wall motion abnormalities. Doppler parameters are consistent with abnormal left ventricular relaxation (grade 1 diastolic dysfunction). The E/e&' ratio is <8, suggesting normal LV filling pressure. - Aorta: No clear dissection flap visualized in this study. The aortic arch size is generous, the descending aorta is well visualized and no evidence for dissection is noted. - Left atrium: The atrium was normal in size. - Tricuspid valve: There was mild regurgitation. - Pulmonary arteries: PA peak pressure: 26 mm Hg (S). - Pericardium, extracardiac: There was no pericardial effusion.  Impressions:  - LVEF 60-65%, moderate concentric LVH, normal LA size, generous aortic arch without clear visualization of a dissection flap, normal descending aorta, diastolic dysfunction, normal LV fililng pressure, no pericardial effusion.      ASSESSMENT AND PLAN  1. 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   - bilateral LE pulse 2+ exam reassuring  - will need better blood pressure control  - ?if should continue hydralazine which is a direct  vasodilator and tend increase shear stress during acute aortic dissection  - continue on labetalol, unclear SBP goal in the setting of dissection and spinal cord ischemia  2. hypertensive heart disease  3. polycystic kidney disease with chronic kidney disease stage III  4. prior tobacco abuse: educated on importance of tobacco cessation   5. hyperlipidemia with LDL cholesterol 129.  6. Recent chest discomfort negative trop without EKG changes  - unlikely to be ACS, plus pt had recent dissection and inability to start any dual antiplatelet med anytime soon  Hassan Buckler Pager: [phone]  Personally seen and examined. Agree with above. Good comment on hydralazine. This is more relevant in the acute setting. Dr. Tyrone Sage and TCTS team were aware of Hydralazine use. Now we are just trying to decrease overall BP.  Will add HCTZ 12.5 MG QD.  Needs improved control.  May try clonidine in future. (Amlodipine vasodilates as well).   Donato Schultz, MD

## 2014-07-13 ENCOUNTER — Inpatient Hospital Stay (HOSPITAL_COMMUNITY): Payer: Medicaid Other | Admitting: *Deleted

## 2014-07-13 ENCOUNTER — Inpatient Hospital Stay (HOSPITAL_COMMUNITY): Payer: Self-pay | Admitting: *Deleted

## 2014-07-13 ENCOUNTER — Inpatient Hospital Stay (HOSPITAL_COMMUNITY): Payer: Medicaid Other | Admitting: Occupational Therapy

## 2014-07-13 DIAGNOSIS — G9519 Other vascular myelopathies: Secondary | ICD-10-CM

## 2014-07-13 DIAGNOSIS — G9782 Other postprocedural complications and disorders of nervous system: Secondary | ICD-10-CM

## 2014-07-13 DIAGNOSIS — F1410 Cocaine abuse, uncomplicated: Secondary | ICD-10-CM

## 2014-07-13 DIAGNOSIS — I10 Essential (primary) hypertension: Secondary | ICD-10-CM

## 2014-07-13 NOTE — Progress Notes (Signed)
Occupational Therapy Session Note  Patient Details  Name: Charlotte RobertHelene J Bamford MRN: [id number] Date of Birth: 1966/09/19  Today's Date: 07/13/2014 OT Individual Time: 0900-1000 OT Individual Time Calculation (min): 60 min   Short Term Goals: Week 1:  OT Short Term Goal 1 (Week 1): Pt will perform upper body bathing and dressing with setup assist OT Short Term Goal 2 (Week 1): Pt will complete lower body bathing supine using AE with mod assist OT Short Term Goal 3 (Week 1): Pt will complete transfer on/off tub bench with mod assist OT Short Term Goal 4 (Week 1): Pt will complete lower body dressing with min assist OT Short Term Goal 5 (Week 1): Pt will complete toileting with min assist  Skilled Therapeutic Interventions/Progress Updates:  Pt with no c/o pain during session. Pt reporting, "This is the first day I have not felt sick."  Pt agreeable to washing seated in wheelchair at sink. However, pt did refuse to put on her own clothing and instead donned hospital gown again. Pt performed grooming at sinkside with set up A to obtain items. UB dressing with set up A for gown. It required assist to donn depends and B socks on feet. Bathing this session with Mod A. Pt propelled self in wheelchair with B UEs to therapy gym with 1 rest break required secondary to fatigue. Pt performed 3 sets of 10 wheelchair push ups for B UE strengthening for functional transfers. Pt required rest break after each set secondary to fatigue. Pt then propelled self with B UEs back to room. Pt seated in wheelchair with tray table placed in front of her. Call bell and all other needed items with in reach.   Therapy Documentation Precautions:  Precautions Precautions: Fall Restrictions Weight Bearing Restrictions: No Pain: Pt with no c/o pain during session.  ADL: ADL ADL Comments: see FIM  See FIM for current functional status  Therapy/Group: Individual Therapy  Lowella Gripittman, Jaquel Glassburn L 07/13/2014, 12:51 PM

## 2014-07-13 NOTE — Progress Notes (Signed)
[facility]     PROGRESS NOTE    Subjective/Complaints: Starting with PT this am, bowels and bladder ok A  review of systems has been performed and if not noted above is otherwise negative.    Objective: Vital Signs: Blood pressure 157/80, pulse 70, temperature 97.9 F (36.6 C), temperature source Oral, resp. rate 18, height 5\' 5"  (1.651 m), weight 69.582 kg (153 lb 6.4 oz), SpO2 95.00%. No results found. No results found for this basename: WBC, HGB, HCT, PLT,  in the last 72 hours No results found for this basename: NA, K, CL, CO, GLUCOSE, BUN, CREATININE, CALCIUM,  in the last 72 hours CBG (last 3)  No results found for this basename: GLUCAP,  in the last 72 hours  Wt Readings from Last 3 Encounters:  07/10/14 69.582 kg (153 lb 6.4 oz)  07/07/14 66.588 kg (146 lb 12.8 oz)  02/09/11 59.648 kg (131 lb 8 oz)    Physical Exam:  Constitutional: [name], place, and time. She appears well-developed and well-nourished.  HENT: oral mucosa moist Head: Normocephalic and atraumatic.  Eyes: Conjunctivae are normal. Pupils are equal, round, and reactive to light.  Neck: Normal range of motion. Neck supple.  Cardiovascular: Regular rhythm and rate  Respiratory: Effort normal and breath sounds normal. No respiratory distress. She has no wheezes. No rales GI: Soft. Bowel sounds are normal. She exhibits no distension. There is no tenderness.  Musculoskeletal: She exhibits no edema. Tenderness to palpation over sternum and xyphoid Neurological: She is alert and oriented to person, place, and time.  LLE>RLE weakness.  3- R to 2- /5  L with HF/KE,  2+/5 PF/DF.  Upper extremities strength 4+/5 deltoid, bicep, tricep, grip  Diminished sensation to PP/LT from breasts distally, 1+/2  Skin: Skin is warm and dry.  Psychiatric: more pleasant and up beat.   Assessment/Plan: 1. Functional deficits secondary to thoracic spinal cord infarct causing  paraplegia which requires 3+ hours per day of interdisciplinary therapy in a comprehensive inpatient rehab setting. Physiatrist is providing close team supervision and 24 hour management of active medical problems listed below. Physiatrist and rehab team continue to assess barriers to discharge/monitor patient progress toward functional and medical goals. FIM: FIM - Bathing Bathing Steps Patient Completed: Chest;Right Arm;Left Arm;Abdomen Bathing: 2: Max-Patient completes 3-4 [id number] 10 parts or 25-49%  FIM - Upper Body Dressing/Undressing Upper body dressing/undressing steps patient completed: Thread/unthread right sleeve of pullover shirt/dresss;Thread/unthread left sleeve of pullover shirt/dress;Put head through opening of pull over shirt/dress;Thread/unthread left sleeve of front closure shirt/dress;Thread/unthread right sleeve of front closure shirt/dress;Pull shirt over trunk Upper body dressing/undressing: 5: Supervision: Safety issues/verbal cues FIM - Lower Body Dressing/Undressing Lower body dressing/undressing steps patient completed: Thread/unthread left pants leg Lower body dressing/undressing: 1: Total-Patient completed less than 25% of tasks  FIM - Toileting Toileting: 1: Total-Patient completed zero steps, helper did all 3  FIM - Diplomatic Services operational officerToilet Transfers Toilet Transfers Assistive Devices: PsychiatristBedside commode Toilet Transfers: 3-To toilet/BSC: Mod A (lift or lower assist);3-From toilet/BSC: Mod A (lift or lower assist)  FIM - Bed/Chair Transfer Bed/Chair Transfer Assistive Devices: Arm rests Bed/Chair Transfer: 3: Supine > Sit: Mod A (lifting assist/Pt. 50-74%/lift 2 legs;3: Sit > Supine: Mod A (lifting assist/Pt. 50-74%/lift 2 legs);4: Bed > Chair or W/C: Min A (steadying Pt. > 75%);4: Chair or W/C > Bed: Min A (steadying Pt. > 75%)  FIM - Locomotion: Wheelchair Distance: 150 Locomotion: Wheelchair: 5: Travels 150 ft or more: maneuvers  on rugs and over door sills with supervision, cueing  or coaxing FIM - Locomotion: Ambulation Locomotion: Ambulation: 0: Activity did not occur  Comprehension Comprehension Mode: Auditory Comprehension: 5-Understands basic 90% of the time/requires cueing < 10% of the time  Expression Expression Mode: Verbal Expression: 5-Expresses complex 90% of the time/cues < 10% of the time  Social Interaction Social Interaction: 4-Interacts appropriately 75 - 89% of the time - Needs redirection for appropriate language or to initiate interaction.  Problem Solving Problem Solving: 4-Solves basic 75 - 89% of the time/requires cueing 10 - 24% of the time  Memory Memory: 3-Recognizes or recalls 50 - 74% of the time/requires cueing 25 - 49% of the time  Medical Problem List and Plan:  1. 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 DVT Prophylaxis/Anticoagulation: Mechanical: Antiembolism stockings, knee (TED hose) Bilateral lower extremities  Sequential compression devices, below knee Bilateral lower extremitie   -dopplers neg 3. Pain Management: Will increase oxycodone to 15 mg and discontinue prn dilaudid.  4. Mood: LCSW to follow for evaluation and support.  5. Neuropsych: This patient is capable of making decisions on her own behalf.   -neuropsych eval for coping  -xanax prn for anxiety.  -zoloft    -+ reinforcement by team 6. Skin/Wound Care: Turn patient every 2 hours. Pressure relief measures. Maintain adequate hydration and nutritional status.  7. Hyponatremia: 134  8. Acute renal failure: push po fluids. Monitor for orthostasis. Labs holding steady 9. Constipation: +BM recently---am suppository 10. Chest pain---EKG normal  --has hx of chest pain due to hematoma,effusions prior to transfer to rehab, likely musculoskeletal component also.   -rx pain symptomatically as well  -appreciate cards input and recs  LOS (Days) 5 A FACE TO  FACE EVALUATION WAS PERFORMED  Claudette Laws E 07/13/2014 8:10 AM

## 2014-07-13 NOTE — Progress Notes (Signed)
Physical Therapy Session Note  Patient Details  Name: Charlotte [name] MRN: [id number] Date of Birth: [birth year]  Today's Date: 07/13/2014 PT Individual Time: 8:00-9:00 (60min)   Short Term Goals: Week 1:  PT Short Term Goal 1 (Week 1): Pt will tolerate 60 minutes of therapeutic activity PT Short Term Goal 2 (Week 1): Pt will perform all bed mobility w/ MinA PT Short Term Goal 3 (Week 1): Pt will tolerate sitting up in wheelchair for 3 hrs at a time PT Short Term Goal 4 (Week 1): Pt will consistently transfer bed<>w/c w/ MinA PT Short Term Goal 5 (Week 1): Pt will perform sit<>stand w/ ModA  Skilled Therapeutic Interventions/Progress Updates:  Tx focused on functional mobility, activity tolerance, sitting balance, transfers, and NMR via forced use, manual facilitation, and multi-modal cues.  Pt up resting in bed, just received breakfast. Discussed pt's goals and progress while eating a few more bites. Pt c/o ongoing aniexty and mentioned breathing helps. Instructed pt in breathing exercise for self-care during periods of "panic attacks," and pt very receptive.   Performed supine and sitting NMR for increased bil LE activation with ankle pumps, quad sets, glute sets, heel slides, and attempted bridging with assist. RLE demonstrates greater activation than LLE. PT performed stretching at heel cords and hamstrings x2 min bil each group. Very tight at ankles and HSs. Pt with increased LLE pain during active movement attempts.   Instructed pt in bed mobility rolling R/L x3 each with assist for LLE flexion set-up and cues for efficient technique. Pt encouraged to direct self cares for assist with mobility. Side lie to sit with Mod A for LEs and trunk.   Performed static and dynamic sitting balance EOB xmin with 0/2 UE assist, needing min A for recovery with dynamic tasks, esp reaching for LEs to adjust position.,   Pt performed lateral scoot transfer with safety cues for set-up and min-guard assist.    Pt propelled WC x150' with S and efficient technique. Needs parrts assist.   Performed multiple attempts sit<>stand, and had successful stand at // bars x40 seconds with mod lifting assist and blocking at knees. Pt able to maintain hip/knee ext in standing with cues for muscle activation and breathing.   Hand off to OT.        Therapy Documentation Precautions:  Precautions Precautions: Fall Restrictions Weight Bearing Restrictions: No General:   Vital Signs: Therapy Vitals Temp: 97.9 F (36.6 C) Temp src: Oral Resp: 18 BP: 157/80 mmHg Oxygen Therapy SpO2: 95 % O2 Device: None (Room air) Pain:min chest tightness, but relieved with relaxation.   See FIM for current functional status  Therapy/Group: Individual Therapy Clydene Lamingole Malene Blaydes, PT, DPT   07/13/2014, 7:56 AM

## 2014-07-13 NOTE — Progress Notes (Signed)
Patient Name: Charlotte [name]      SUBJECTIVE: admitted with spinal cord infarction 2/2 type b dissection Also reported  to have had atrial fibrillation raising the possibility of embolism-- can not find documentation  Continues to complain of shortness of breath and some chest pains, the latter aggravated by movement in her wheel chair    Past Medical History  Diagnosis Date  . Hypertension   . Family history of anesthesia complication     "alot of back pains from the epidurals"  . Complication of anesthesia     "alot of back pains from the epidurals"  . Anemia   . Depression   . GERD (gastroesophageal reflux disease)   . Daily headache   . Migraine     "sometimes twice/day" (07/05/2014)  . Stroke 06/29/2014    residual BLE weakness  . 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 Polycystic kidney disease     /notes 07/05/2014  . 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 Arthritis     "shoulders" (07/05/2014)  . Bipolar disorder   . Anxiety     Scheduled Meds:  Scheduled Meds: . amoxicillin  250 mg Oral Q12H  . atorvastatin  20 mg Oral q1800  . bisacodyl  10 mg Rectal Q0600  . famotidine  20 mg Oral BID  . feeding supplement (ENSURE COMPLETE)  237 mL Oral TID BM  . gabapentin  100 mg Oral TID  . hydrALAZINE  75 mg Oral 3 times per day  . hydrochlorothiazide  12.5 mg Oral Daily  . labetalol  300 mg Oral BID  . senna-docusate  2 tablet Oral QHS  . sertraline  50 mg Oral Daily   Continuous Infusions:  acetaminophen, albuterol, ALPRAZolam, alum & mag hydroxide-simeth, guaiFENesin-dextromethorphan, methocarbamol, oxyCODONE, prochlorperazine, prochlorperazine, prochlorperazine, traMADol, traZODone    PHYSICAL EXAM Filed Vitals:   07/12/14 2140 07/13/14 0605 07/13/14 0606 07/13/14 1012  BP: 149/70  157/80 155/81  Pulse: 70   78  Temp: 98.9 F (37.2 C) 97.9 F (36.6 C)      TempSrc: Oral Oral    Resp: 19 18    Height:      Weight:      SpO2: 100% 95%      General appearance: alert, cooperative and no distress Neck: no JVD Lungs: clear to auscultation bilaterally Heart: regular rate and rhythm, S1, S2 normal, no murmur, click, rub or gallop Extremities: extremities normal, atraumatic, no cyanosis or edema Skin: Skin color, texture, turgor normal. No rashes or lesions Neurologic  Affect flat, not able to move feet much     Intake/Output Summary (Last 24 hours) at 07/13/14 1031 Last data filed at 07/12/14 2140  Gross per 24 hour  Intake    560 ml  Output      0 ml  Net    560 ml    LABS: Basic Metabolic Panel:  Recent Labs Lab 07/07/14 0400 07/08/14 0340 07/09/14 0620  NA 134* 135* 134*  K 5.2 4.8 4.4  CL 100 101 99  CO2 GLUCOSE 115* 109* 106*  BUN 32* 34* 30*  CREATININE 1.60* 1.66* 1.54*  CALCIUM 9.5 9.4 9.4   Cardiac Enzymes: No results found for this basename: CKTOTAL, CKMB, CKMBINDEX, TROPONINI,  in the last 72 hours CBC:  Recent Labs Lab 07/09/14 0620  WBC 7.2  NEUTROABS 5.3  HGB 10.8*  HCT 32.7*  MCV 84.3  PLT 326      ASSESSMENT AND PLAN:  Active Problems:   HYPERTENSION, ESSENTIAL   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

## 2014-07-13 NOTE — Progress Notes (Addendum)
Occupational Therapy Session Note  Patient Details  Name: Charlotte [name] MRN: [id number] Date of Birth: 1966/03/24  Today's Date: 07/13/2014 OT Individual Time: -   1430-1530   (60 min) OT Individual Time Calculation (min): 60 min   Short Term Goals: Week 1:  OT Short Term Goal 1 (Week 1): Pt will perform upper body bathing and dressing with setup assist OT Short Term Goal 2 (Week 1): Pt will complete lower body bathing supine using AE with mod assist OT Short Term Goal 3 (Week 1): Pt will complete transfer on/off tub bench with mod assist OT Short Term Goal 4 (Week 1): Pt will complete lower body dressing with min assist OT Short Term Goal 5 (Week 1): Pt will complete toileting with min assist  Skilled Therapeutic Interventions/Progress Updates:    Addressed exercises to all extremities.  Pt. Lying bed upon OT arrival.  Pt did not get sleep last night and did not want to get out of bed.  Pt agreed to do exercises in bed.  Performed AROM, AAROM, PROM to all extremities.  Pt needed to use the bathroom.  [facility]  Rolled to right with mod assist for bedpan placement.  Rolled to left with mod assist.  Pt.remained in bed at end of session with all needs in reach.     Therapy Documentation Precautions:  Precautions Precautions: Fall Restrictions Weight Bearing Restrictions: No General:   Vital Signs: Therapy Vitals BP: 161/79 mmHg Pain:  none   ADL: ADL ADL Comments: see FIM   See FIM for current functional status  Therapy/Group: Individual Therapy  Humberto Sealsdwards, Chadric Kimberley J 07/13/2014, 3:03 PM

## 2014-07-14 ENCOUNTER — Inpatient Hospital Stay (HOSPITAL_COMMUNITY): Payer: Self-pay

## 2014-07-14 ENCOUNTER — Inpatient Hospital Stay (HOSPITAL_COMMUNITY): Payer: Medicaid Other

## 2014-07-14 ENCOUNTER — Inpatient Hospital Stay (HOSPITAL_COMMUNITY): Payer: Medicaid Other | Admitting: *Deleted

## 2014-07-14 NOTE — Progress Notes (Addendum)
Occupational Therapy Session Note  Patient Details  Name: Charlotte [name] MRN: [id number] Date of Birth: 04-Jan-1966  Today's Date: 07/14/2014 OT Individual Time:  - 1615-1645   (30 min)     Short Term Goals: Week 1:  OT Short Term Goal 1 (Week 1): Pt will perform upper body bathing and dressing with setup assist OT Short Term Goal 2 (Week 1): Pt will complete lower body bathing supine using AE with mod assist OT Short Term Goal 3 (Week 1): Pt will complete transfer on/off tub bench with mod assist OT Short Term Goal 4 (Week 1): Pt will complete lower body dressing with min assist OT Short Term Goal 5 (Week 1): Pt will complete toileting with min assist Week 2:     Skilled Therapeutic Interventions/Progress Updates:    Pt. Lying in bed on bedpan upon OT arrival.  Pt. Initially refused to do therapy, but agreed to do exercises in bed.  Encouraged pt to get on [facility] or in wc but she refused.  Addressed bed mobility, and therex.  Pt. Rolled to right and left using rails to remove bed pan, don depend.and put clean pad underneath.  She performed UE/LE AROM, AAROM.  Pt performed all exercises with minimal assistance and verbal cues.   Remained in bed with call bell,phone within reach.   Therapy Documentation Precautions:  Precautions Precautions: Fall Restrictions Weight Bearing Restrictions: No      Pain:  none  ADL: ADL ADL Comments: see FIM     See FIM for current functional status  Therapy/Group: Individual Therapy  Humberto Seals 07/14/2014, 4:25 PM

## 2014-07-14 NOTE — Progress Notes (Signed)
Reported tightness in chest "feels like I need to burp" Pressure coming up under both side of ribs. Oxy and maalox given VSS. Dr. Wynn Banker notified; new orders received. Results called back to Dr. Wynn Banker; lft ventricular hypertrophy; non specific t wave abnormality and NSR same as EKG done several days ago; CXR noted improvement in basilar atelectasis. No CHF. Pamelia Hoit

## 2014-07-14 NOTE — Progress Notes (Signed)
Physical Therapy Session Note  Patient Details  Name: Charlotte [name] MRN: [id number] Date of Birth: [birth year]  Today's Date: 07/14/2014 PT Individual Time: 4098-1191 PT Individual Time Calculation (min): 30 min   Short Term Goals: Week 1:  PT Short Term Goal 1 (Week 1): Pt will tolerate 60 minutes of therapeutic activity PT Short Term Goal 2 (Week 1): Pt will perform all bed mobility w/ MinA PT Short Term Goal 3 (Week 1): Pt will tolerate sitting up in wheelchair for 3 hrs at a time PT Short Term Goal 4 (Week 1): Pt will consistently transfer bed<>w/c w/ MinA PT Short Term Goal 5 (Week 1): Pt will perform sit<>stand w/ ModA  Skilled Therapeutic Interventions/Progress Updates:    Pt received supine in bed, agreeable to participate in bed level therapy for BLE "I'm not getting up and doing anything but I'll do some leg stuff." Therapist provided Bilateral HS and heel cord stretching for pt, PNF D1 for bilateral LE (noted motor activation in RLE>LLE) w/ rhythmic initiation and moderate resistance provided for extension on R, assisted heel slides and SLR x10 each LE. Noted improved mood vs this AM and past days. Pt left supine in bed w/ all needs within reach.  Therapy Documentation Precautions:  Precautions Precautions: Fall Restrictions Weight Bearing Restrictions: No General:   Vital Signs: Therapy Vitals Temp: 98.2 F (36.8 C) Temp src: Oral Pulse Rate: 64 Resp: 20 BP: 148/74 mmHg Patient Position (if appropriate): Lying Oxygen Therapy SpO2: 98 % O2 Device: None (Room air) Pain:   Mobility:   Locomotion :    Trunk/Postural Assessment :    Balance:   Exercises:   Other Treatments:    See FIM for current functional status  Therapy/Group: Individual Therapy  Hosie Spangle Hosie Spangle, PT, DPT 07/14/2014, 7:59 AM

## 2014-07-14 NOTE — Care Management Note (Signed)
[facility] Individual Statement of Services  Patient Name:  Charlotte [name]  Date:  07/11/2014  Welcome to the [facility].  Our goal is to provide you with an individualized program based on your diagnosis and situation, designed to meet your specific needs.  With this comprehensive rehabilitation program, you will be expected to participate in at least 3 hours of rehabilitation therapies Monday-Friday, with modified therapy programming on the weekends.  Your rehabilitation program will include the following services:  Physical Therapy (PT), Occupational Therapy (OT), Speech Therapy (ST), 24 hour per day rehabilitation nursing, Therapeutic Recreaction (TR), Neuropsychology, Case Management (Social Worker), Rehabilitation Medicine, Nutrition Services and Pharmacy Services  Weekly team conferences will be held on Tuesdays to discuss your progress.  Your Social Worker will talk with you frequently to get your input and to update you on team discussions.  Team conferences with you and your family in attendance may also be held.  Expected length of stay: 2-3 weeks  Overall anticipated outcome: supervision - mod i w/c level  Depending on your progress and recovery, your program may change. Your Social Worker will coordinate services and will keep you informed of any changes. Your Social Worker's name and contact numbers are listed  below.  The following services may also be recommended but are not provided by the [facility]:   Driving Evaluations  Home Health Rehabiltiation Services  Outpatient Rehabilitation Services  Vocational Rehabilitation   Arrangements will be made to provide these services after discharge if needed.  Arrangements include referral to agencies that provide these services.  Your insurance has been verified to be:  None (Medicaid application started) Your primary doctor is:  [facility]  Pertinent information  will be shared with your doctor and your insurance company.  Social Worker:  [place], [place] [phone] or ([phone]   Information discussed with and copy given to patient by: Amada Jupiter, 07/11/2014, 7:00 PM

## 2014-07-14 NOTE — Progress Notes (Signed)
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 Less chest pain C/o food sticking at lower sternum  New post dissection  stable   Past Medical History  Diagnosis Date  . Hypertension   . Family history of anesthesia complication     "alot of back pains from the epidurals"  . Complication of anesthesia     "alot of back pains from the epidurals"  . Anemia   . Depression   . GERD (gastroesophageal reflux disease)   . Daily headache   . Migraine     "sometimes twice/day" (07/05/2014)  . Stroke 06/29/2014    residual BLE weakness  . 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 Polycystic kidney disease     /notes 07/05/2014  . 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 Arthritis     "shoulders" (07/05/2014)  . Bipolar disorder   . Anxiety     Scheduled Meds:  Scheduled Meds: . amoxicillin  250 mg Oral Q12H  . atorvastatin  20 mg Oral q1800  . bisacodyl  10 mg Rectal Q0600  . famotidine  20 mg Oral BID  . feeding supplement (ENSURE COMPLETE)  237 mL Oral TID BM  . gabapentin  100 mg Oral TID  . hydrALAZINE  75 mg Oral 3 times per day  . hydrochlorothiazide  12.5 mg Oral Daily  . labetalol  300 mg Oral BID  . senna-docusate  2 tablet Oral QHS  . sertraline  50 mg Oral Daily   Continuous Infusions:  acetaminophen, albuterol, ALPRAZolam, alum & mag hydroxide-simeth, guaiFENesin-dextromethorphan, methocarbamol, oxyCODONE, prochlorperazine, prochlorperazine, prochlorperazine, traMADol, traZODone    PHYSICAL EXAM Filed Vitals:   07/13/14 1500 07/13/14 2047 07/14/14 0450 07/14/14 0944  BP: 153/72 146/65 148/74 138/75  Pulse: 70 72 64 78  Temp: 98.4 F (36.9 C) 98.9 F (37.2 C) 98.2  F (36.8 C)   TempSrc: Oral Oral Oral   Resp: Height:      Weight:      SpO2: 95% 95% 98%     General appearance: alert, cooperative and no distress Lungs: clear to auscultation bilaterally Heart: regular rate and rhythm, S1, S2 normal, no murmur,   Extremities: extremities normal, atraumatic, no cyanosis or edema Skin: Skin color, texture, turgor normal. No rashes or lesions Neurologic  Affect flat, moves left foot and leg, but not right    Intake/Output Summary (Last 24 hours) at 07/14/14 1010 Last data filed at 07/14/14 0442  Gross per 24 hour  Intake    960 ml  Output    575 ml  Net    385 ml    LABS: Basic Metabolic Panel:  Recent Labs Lab 07/08/14 0340 07/09/14 0620  NA 135* 134*  K 4.8 4.4  CL 101 99  CO2 20 19  GLUCOSE 109* 106*  BUN 34* 30*  CREATININE 1.66* 1.54*  CALCIUM 9.4 9.4   Cardiac Enzymes: No results found for this basename: CKTOTAL, CKMB, CKMBINDEX, TROPONINI,  in the last 72 hours CBC:  Recent Labs Lab 07/09/14 0620  WBC 7.2  NEUTROABS 5.3  HGB 10.8*  HCT 32.7*  MCV 84.3  PLT 326  ASSESSMENT AND PLAN:  Active Problems:   HYPERTENSION, ESSENTIAL   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

## 2014-07-14 NOTE — Progress Notes (Signed)
[place] PHYSICAL MEDICINE & REHABILITATION     PROGRESS NOTE    Subjective/Complaints: Pt had good night yesterday A  review of systems has been performed and if not noted above is otherwise negative.    Objective: Vital Signs: Blood pressure 148/74, pulse 64, temperature 98.2 F (36.8 C), temperature source Oral, resp. rate 20, height  (1.651 m), weight 69.582 kg (153 lb 6.4 oz), SpO2 98.00%. No results found. No results found for this basename: WBC, HGB, HCT, PLT,  in the last 72 hours No results found for this basename: NA, K, CL, CO, GLUCOSE, BUN, CREATININE, CALCIUM,  in the last 72 hours CBG (last 3)  No results found for this basename: GLUCAP,  in the last 72 hours  Wt Readings from Last 3 Encounters:  07/10/14 69.582 kg (153 lb 6.4 oz)  07/07/14 66.588 kg (146 lb 12.8 oz)  02/09/11 59.648 kg (131 lb 8 oz)    Physical Exam:  Constitutional: [name], place, and time. She appears well-developed and well-nourished.  HENT: oral mucosa moist Head: Normocephalic and atraumatic.  Eyes: Conjunctivae are normal. Pupils are equal, round, and reactive to light.  Neck: Normal range of motion. Neck supple.  Cardiovascular: Regular rhythm and rate  Respiratory: Effort normal and breath sounds normal. No respiratory distress. She has no wheezes. No rales GI: Soft. Bowel sounds are normal. She exhibits no distension. There is no tenderness.  Musculoskeletal: She exhibits no edema. Tenderness to palpation over sternum and xyphoid Neurological: She is alert and oriented to person, place, and time.  LLE>RLE weakness.  3- R to 2- /5  L with HF/KE,  2+/5 PF/DF.  Upper extremities strength5/5 deltoid, bicep, tricep, grip   Skin: Skin is warm and dry.  Psychiatric: more pleasant and up beat.   Assessment/Plan: 1. Functional deficits secondary to thoracic spinal cord infarct causing paraplegia which requires 3+ hours per day of interdisciplinary therapy in a  comprehensive inpatient rehab setting. Physiatrist is providing close team supervision and 24 hour management of active medical problems listed below. Physiatrist and rehab team continue to assess barriers to discharge/monitor patient progress toward functional and medical goals. FIM: FIM - Bathing Bathing Steps Patient Completed: Chest;Right Arm;Left Arm;Abdomen;Right upper leg;Left upper leg Bathing: 3: Mod-Patient completes 5-7 59f 10 parts or 50-74%  FIM - Upper Body Dressing/Undressing Upper body dressing/undressing steps patient completed: Put head through opening of pull over shirt/dress;Pull shirt over trunk;Thread/unthread left sleeve of pullover shirt/dress;Thread/unthread right sleeve of pullover shirt/dresss Upper body dressing/undressing: 5: Set-up assist to: Obtain clothing/put away FIM - Lower Body Dressing/Undressing Lower body dressing/undressing steps patient completed: Thread/unthread left pants leg Lower body dressing/undressing: 1: Total-Patient completed less than 25% of tasks (Assistance for B socks and diaper)  FIM - Toileting Toileting: 1: Total-Patient completed zero steps, helper did all 3  FIM - Diplomatic Services operational officer Devices: Psychiatrist Transfers: 0-Activity did not occur  FIM - Banker Devices: Arm rests Bed/Chair Transfer: 3: Supine > Sit: Mod A (lifting assist/Pt. 50-74%/lift 2 legs;4: Bed > Chair or W/C: Min A (steadying Pt. > 75%)  FIM - Locomotion: Wheelchair Distance: 150 Locomotion: Wheelchair: 5: Travels 150 ft or more: maneuvers on rugs and over door sills with supervision, cueing or coaxing FIM - Locomotion: Ambulation Locomotion: Ambulation: 0: Activity did not occur  Comprehension Comprehension Mode: Auditory Comprehension: 5-Understands basic 90% of the time/requires cueing < 10% of the time  Expression Expression Mode: Verbal  Expression: 5-Expresses complex 90% of  the time/cues < 10% of the time  Social Interaction Social Interaction: 4-Interacts appropriately 75 - 89% of the time - Needs redirection for appropriate language or to initiate interaction.  Problem Solving Problem Solving: 4-Solves basic 75 - 89% of the time/requires cueing 10 - 24% of the time  Memory Memory: 3-Recognizes or recalls 50 - 74% of the time/requires cueing 25 - 49% of the time  Medical Problem List and Plan:  1. 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 DVT Prophylaxis/Anticoagulation: Mechanical: Antiembolism stockings, knee (TED hose) Bilateral lower extremities  Sequential compression devices, below knee Bilateral lower extremitie   -dopplers neg 3. Pain Management: Will increase oxycodone to 15 mg and discontinue prn dilaudid.  4. Mood: LCSW to follow for evaluation and support.  5. Neuropsych: This patient is capable of making decisions on her own behalf.   -neuropsych eval for coping  -xanax prn for anxiety.  -zoloft    -+ reinforcement by team 6. Skin/Wound Care: Turn patient every 2 hours. Pressure relief measures. Maintain adequate hydration and nutritional status.  7. Hyponatremia: 134  8. Acute renal failure: push po fluids. Monitor for orthostasis. Labs holding steady 9. Constipation: +BM recently---am suppository 10. Chest pain---EKG normal  --has hx of chest pain due to hematoma,effusions prior to transfer to rehab, likely musculoskeletal component also.   -rx pain symptomatically as well  -appreciate cards input and recs  LOS (Days) 6 A FACE TO FACE EVALUATION WAS PERFORMED  Erick Colace 07/14/2014 7:41 AM

## 2014-07-14 NOTE — Progress Notes (Signed)
Physical Therapy Note  Patient Details  Name: Charlotte [name] MRN: [id number] Date of Birth: [birth year] Today's Date: 07/14/2014    PT arrived at scheduled therapy time and pt was asleep w/ breakfast tray next to bed. PT allowed pt time to eat breakfast prior to beginning therapy. After finishing breakfast, pt reported she had been incontinent of bladder while she was eating and needed to be changed. Pt refused for female PT to assist her w/ changing, requested that female help her. RN/NT made aware, however both were busy with other patients. Relayed to pt that it would take longer for nursing to come to her room to assist w/ changing, pt continued to refuse assistance from female PT. RN/NT aware. Pt left supine in bed waiting for nursing w/ all needs within reach. PT missed 45 minutes of make up therapy time.   Hosie Spangle Hosie Spangle, PT, DPT 07/14/2014, 8:41 AM

## 2014-07-15 ENCOUNTER — Inpatient Hospital Stay (HOSPITAL_COMMUNITY): Payer: Self-pay

## 2014-07-15 ENCOUNTER — Inpatient Hospital Stay (HOSPITAL_COMMUNITY): Payer: Medicaid Other

## 2014-07-15 DIAGNOSIS — G9519 Other vascular myelopathies: Secondary | ICD-10-CM

## 2014-07-15 DIAGNOSIS — G9782 Other postprocedural complications and disorders of nervous system: Secondary | ICD-10-CM

## 2014-07-15 DIAGNOSIS — I10 Essential (primary) hypertension: Secondary | ICD-10-CM

## 2014-07-15 DIAGNOSIS — F1410 Cocaine abuse, uncomplicated: Secondary | ICD-10-CM

## 2014-07-15 LAB — HEPATIC FUNCTION PANEL
ALBUMIN: 2.3 g/dL — AB (ref 3.5–5.2)
ALT: 123 U/L — AB (ref 0–35)
AST: 71 U/L — AB (ref 0–37)
Alkaline Phosphatase: 165 U/L — ABNORMAL HIGH (ref 39–117)
BILIRUBIN TOTAL: 0.5 mg/dL (ref 0.3–1.2)
Bilirubin, Direct: <0.2 mg/dL (ref 0.0–0.3)
Total Protein: 6.4 g/dL (ref 6.0–8.3)

## 2014-07-15 MED ORDER — POLYETHYLENE GLYCOL 3350 17 G PO PACK
17.0000 g | PACK | Freq: Every day | ORAL | Status: DC
  Administered 2014-07-15 – 2014-07-24 (×9): 17 g via ORAL
  Filled 2014-07-15 (×11): qty 1

## 2014-07-15 MED ORDER — HYDRALAZINE HCL 50 MG PO TABS
100.0000 mg | ORAL_TABLET | Freq: Three times a day (TID) | ORAL | Status: DC
  Administered 2014-07-15 – 2014-07-25 (×30): 100 mg via ORAL
  Filled 2014-07-15 (×33): qty 2

## 2014-07-15 MED ORDER — NICOTINE 14 MG/24HR TD PT24
14.0000 mg | MEDICATED_PATCH | Freq: Every day | TRANSDERMAL | Status: DC
  Administered 2014-07-15 – 2014-07-25 (×11): 14 mg via TRANSDERMAL
  Filled 2014-07-15 (×13): qty 1

## 2014-07-15 NOTE — Progress Notes (Signed)
Physical Therapy Session Note  Patient Details  Name: Charlotte [name] MRN: [id number] Date of Birth: 10/31/1966  Today's Date: 07/15/2014 PT Individual Time: 0800-0850 PT Individual Time Calculation (min): 50 min  Session 2 Time: 1345-1530 Time Calculation (min): 45 min   Short Term Goals: Week 1:  PT Short Term Goal 1 (Week 1): Pt will tolerate 60 minutes of therapeutic activity PT Short Term Goal 2 (Week 1): Pt will perform all bed mobility w/ MinA PT Short Term Goal 3 (Week 1): Pt will tolerate sitting up in wheelchair for 3 hrs at a time PT Short Term Goal 4 (Week 1): Pt will consistently transfer bed<>w/c w/ MinA PT Short Term Goal 5 (Week 1): Pt will perform sit<>stand w/ ModA  Skilled Therapeutic Interventions/Progress Updates:    Session 1: Session focused on standing, NMR for BLE, upright tolerance, w/c propulsion. Received supine in bed, agreeable to participate in therapy after toileting on BSC. Pt requested that female staff member come to assist her vs female therapist. Pt missed 10 minutes of scheduled PT time for toileting w/ nurse tech. Pt propelled w/c 150' to rehab gym w/ S. Performed x3 standing w/ STEDI lift and ModA to come to standing, MinA to remain standing. Able to progress to standing for 20-30 seconds at a time. Pt reported feeling fatigued and hot after standing activity. Pt propelled w/c 150' through controlled environment then 15' through simulated home environment (carpeted surface, obstacles) all w/ supervision. Pt requires MinA to manage w/c parts, mainly for managing leg rests. MinA scoot transfer w/c>bed. Performed exercises for BLE strength and NMR in supine, able to take min-mod resistance for RLE extension and min resistance for LLE extension, requires assist for BLE flexion. 2x10 supine hip abd/add BLE. Pt left supine in bed w/ family present w/ all needs within reach.  Session 2: Pt received seated in w/c, agreeable to participate in therapy w/ min  encouragement. Session focused on transfer training, seated balance/trunk control, bed mobility. Pt propelled w/c w/ supervision 100' to rehab gym w/ BUE. Blocked practice transfer training for lateral scoot transfer w/c<> mat. Mod VC's for hand placement, sequencing, overall A of MinA progressing to CGA w/ further practice. Bed mobility supine>SL>sit to L and to R, overall Min-ModA for managing BLE into bed (decreased assist for moving BLE out of bed. In supine blocked practice for rolling L and R x5 ea way w/ log roll technique. Seated balance reaching task outside BOS in front, side, behind to limits of stability. Pt propelled w/c 100' back to room, left seated in w/c w/ family present w/ all needs within reach.   Therapy Documentation Precautions:  Precautions Precautions: Fall Restrictions Weight Bearing Restrictions: No General: PT Amount of Missed Time (min): 10 Minutes PT Missed Treatment Reason:  (Toileting) Vital Signs: Therapy Vitals Temp: 98.9 F (37.2 C) Temp src: Oral Pulse Rate: 80 Resp: 18 Patient Position (if appropriate): Lying Pain: Pain Assessment Pain Assessment: 0-10 Pain Score: Asleep Pain Type: Acute pain Pain Location: Leg Pain Orientation: Right;Left Pain Descriptors / Indicators: Throbbing Pain Frequency: Intermittent Pain Onset: Unable to tell Pain Intervention(s): Medication (See eMAR) Multiple Pain Sites: No  See FIM for current functional status  Therapy/Group: Individual Therapy  Hosie Spangle Hosie Spangle, PT, DPT 07/15/2014, 9:01 AM

## 2014-07-15 NOTE — Progress Notes (Signed)
[place] PHYSICAL MEDICINE & REHABILITATION     PROGRESS NOTE    Subjective/Complaints: Feeling well. In good spirits. Happy that she has a dc date to target (9/4) A  review of systems has been performed and if not noted above is otherwise negative.    Objective: Vital Signs: Blood pressure 158/76, pulse 80, temperature 98.9 F (37.2 C), temperature source Oral, resp. rate 18, height  (1.651 m), weight 69.582 kg (153 lb 6.4 oz), SpO2 95.00%. Dg Chest 2 View  07/14/2014   CLINICAL DATA:  Chest pain  EXAM: CHEST  2 VIEW  COMPARISON:  Portable chest x-ray of July 09, 2014  FINDINGS: The lungs are adequately inflated. The atelectatic change at the lung bases has cleared. The cardiopericardial silhouette and pulmonary vascularity are normal. There is stable tortuosity of the descending thoracic aorta. The bony thorax is unremarkable.  IMPRESSION: There has been interval improvement in both lungs with resolution of basilar atelectasis. There is no evidence of CHF.   Electronically Signed   By: David  [place]   On: 07/14/2014 12:27   No results found for this basename: WBC, HGB, HCT, PLT,  in the last 72 hours No results found for this basename: NA, K, CL, CO, GLUCOSE, BUN, CREATININE, CALCIUM,  in the last 72 hours CBG (last 3)  No results found for this basename: GLUCAP,  in the last 72 hours  Wt Readings from Last 3 Encounters:  07/10/14 69.582 kg (153 lb 6.4 oz)  07/07/14 66.588 kg (146 lb 12.8 oz)  02/09/11 59.648 kg (131 lb 8 oz)    Physical Exam:  Constitutional: [name], place, and time. She appears well-developed and well-nourished.  HENT: oral mucosa moist Head: Normocephalic and atraumatic.  Eyes: Conjunctivae are normal. Pupils are equal, round, and reactive to light.  Neck: Normal range of motion. Neck supple.  Cardiovascular: Regular rhythm and rate  Respiratory: Effort normal and breath sounds normal. No respiratory distress. She has no wheezes.  No rales GI: Soft. Bowel sounds are normal. She exhibits no distension. There is no tenderness.  Musculoskeletal: She exhibits no edema. Tenderness to palpation over sternum and xyphoid Neurological: She is alert and oriented to person, place, and time.  LLE>RLE weakness.  3- R to 2- /5  L with HF/KE,  2+/5 PF/DF.  Upper extremities strength5/5 deltoid, bicep, tricep, grip   Skin: Skin is warm and dry.  Psychiatric: more pleasant and up beat.   Assessment/Plan: 1. Functional deficits secondary to thoracic spinal cord infarct causing paraplegia which requires 3+ hours per day of interdisciplinary therapy in a comprehensive inpatient rehab setting. Physiatrist is providing close team supervision and 24 hour management of active medical problems listed below. Physiatrist and rehab team continue to assess barriers to discharge/monitor patient progress toward functional and medical goals. FIM: FIM - Bathing Bathing Steps Patient Completed: Chest;Right Arm;Left Arm;Abdomen;Right upper leg;Left upper leg Bathing: 3: Mod-Patient completes 5-7 76f 10 parts or 50-74%  FIM - Upper Body Dressing/Undressing Upper body dressing/undressing steps patient completed: Put head through opening of pull over shirt/dress;Pull shirt over trunk;Thread/unthread left sleeve of pullover shirt/dress;Thread/unthread right sleeve of pullover shirt/dresss Upper body dressing/undressing: 5: Set-up assist to: Obtain clothing/put away FIM - Lower Body Dressing/Undressing Lower body dressing/undressing steps patient completed: Thread/unthread left pants leg Lower body dressing/undressing: 1: Total-Patient completed less than 25% of tasks (Assistance for B socks and diaper)  FIM - Toileting Toileting: 1: Total-Patient completed zero steps, helper did all 3  FIM - Diplomatic Services operational officer Devices: Psychiatrist Transfers: 0-Activity did not occur  FIM - Landscape architect Devices: Arm rests Bed/Chair Transfer: 0: Activity did not occur  FIM - Locomotion: Wheelchair Distance: 150 Locomotion: Wheelchair: 0: Activity did not occur FIM - Locomotion: Ambulation Locomotion: Ambulation: 0: Activity did not occur  Comprehension Comprehension Mode: Auditory Comprehension: 5-Understands basic 90% of the time/requires cueing < 10% of the time  Expression Expression Mode: Verbal Expression: 5-Expresses complex 90% of the time/cues < 10% of the time  Social Interaction Social Interaction: 4-Interacts appropriately 75 - 89% of the time - Needs redirection for appropriate language or to initiate interaction.  Problem Solving Problem Solving: 4-Solves basic 75 - 89% of the time/requires cueing 10 - 24% of the time  Memory Memory: 3-Recognizes or recalls 50 - 74% of the time/requires cueing 25 - 49% of the time  Medical Problem List and Plan:  1. 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 DVT Prophylaxis/Anticoagulation: Mechanical: Antiembolism stockings, knee (TED hose) Bilateral lower extremities  Sequential compression devices, below knee Bilateral lower extremitie   -dopplers neg 3. Pain Management: Will increase oxycodone to 15 mg and discontinue prn dilaudid.  4. Mood: LCSW to follow for evaluation and support.  5. Neuropsych: This patient is capable of making decisions on her own behalf.   -xanax prn for anxiety.  -zoloft    -+ reinforcement by team---seems to be responding to progress 6. Skin/Wound Care: Turn patient every 2 hours. Pressure relief measures. Maintain adequate hydration and nutritional status.  7. Hyponatremia: 134  8. Acute renal failure: push po fluids. Monitor for orthostasis. Labs holding steady 9. Constipation: +BM recently---am suppository 10. Chest pain---EKG normal  --has hx of chest pain due to  hematoma,effusions prior to transfer to rehab, likely musculoskeletal component also.   -rx pain symptomatically as well  -appreciate cards input and recs  LOS (Days) 7 A FACE TO FACE EVALUATION WAS PERFORMED  Charlotte [name] T 07/15/2014 8:35 AM

## 2014-07-15 NOTE — Progress Notes (Signed)
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 No acute issue overnight.   CURRENT MEDS . atorvastatin  20 mg Oral q1800  . bisacodyl  10 mg Rectal Q0600  . famotidine  20 mg Oral BID  . feeding supplement (ENSURE COMPLETE)  237 mL Oral TID BM  . gabapentin  100 mg Oral TID  . hydrALAZINE  75 mg Oral 3 times per day  . hydrochlorothiazide  12.5 mg Oral Daily  . labetalol  300 mg Oral BID  . senna-docusate  2 tablet Oral QHS  . sertraline  50 mg Oral Daily    OBJECTIVE  Filed Vitals:   07/14/14 1120 07/14/14 1516 07/14/14 2041 07/15/14 0539  BP: 145/67 130/61 158/76   Pulse: 60 73 65 80  Temp:  98.5 F (36.9 C) 98.6 F (37 C) 98.9 F (37.2 C)  TempSrc:  Oral Oral Oral  Resp: 20 19 18 18   Height:      Weight:      SpO2: 98% 94% 95%     Intake/Output Summary (Last 24 hours) at 07/15/14 1044 Last data filed at 07/15/14 0700  Gross per 24 hour  Intake   1200 ml  Output      0 ml  Net   1200 ml   Filed Weights   07/08/14 1700 07/10/14 1014  Weight: 149 lb 9.6 oz (67.858 kg) 153 lb 6.4 oz (69.582 kg)    PHYSICAL EXAM  General: Pleasant, NAD. Neuro: Alert and oriented X 3. LLE weaker, able to move all extremity Psych: Normal affect. HEENT:  Normal  Neck: Supple without bruits or JVD. Lungs:  Resp regular and unlabored, mildly decrease breath sound, however no clear rhonchi, rale or wheezing. Heart: RRR no s3, s4, or murmurs. Abdomen: Soft, non-tender, non-distended, BS + x 4.  Extremities: No clubbing, cyanosis or edema. DP/PT/Radials 2+ and equal bilaterally.  Accessory Clinical Findings  Liver Function Tests  Recent Labs  07/15/14 0656  AST 71*  ALT 123*  ALKPHOS  165*  BILITOT 0.5  PROT 6.4  ALBUMIN 2.3*    TELE Not on tele    ECG  NSR with nonspecific T wave inversion, no significant ST changes, LVH  Echocardiogram  LV EF: 60% - 65%  ------------------------------------------------------------------- Indications: Aortic Dissection Distal To the Left Subclavion (441).  ------------------------------------------------------------------- History: Risk factors: Current tobacco use. Hypertension.  ------------------------------------------------------------------- Study Conclusions  - Left ventricle: The cavity size was normal. There was moderate concentric hypertrophy. Systolic function was normal. The estimated ejection fraction was in the range of 60% to 65%. Wall motion was normal; there were no regional wall motion abnormalities. Doppler parameters are consistent with abnormal left ventricular relaxation (grade 1 diastolic dysfunction). The E/e&' ratio is <8, suggesting normal LV filling pressure. - Aorta: No clear dissection flap visualized in this study. The aortic arch size is generous, the descending aorta is well visualized and no evidence for dissection is noted. - Left atrium: The atrium was normal in size. - Tricuspid valve: There was mild regurgitation. - Pulmonary arteries: PA peak pressure: 26 mm Hg (S). - Pericardium, extracardiac: There was no pericardial effusion.  Impressions:  - LVEF 60-65%, moderate concentric LVH, normal  LA size, generous aortic arch without clear visualization of a dissection flap, normal descending aorta, diastolic dysfunction, normal LV fililng pressure, no pericardial effusion.      Radiology/Studies  Dg Chest 1 View  07/05/2014   CLINICAL DATA:  Post LEFT thoracentesis  EXAM: CHEST - 1 VIEW  COMPARISON:  07/04/2014  FINDINGS: Minimal enlargement of cardiac silhouette.  Mediastinal contours and pulmonary vascularity normal.  Elevation of LEFT diaphragm with mild bibasilar  atelectasis.  Decreased LEFT pleural effusion since preceding exam.  No pneumothorax.  Upper lungs clear.  Bones unremarkable.  IMPRESSION: No pneumothorax following LEFT thoracentesis.  Bibasilar atelectasis with decreased LEFT pleural effusion since previous exam.   Electronically Signed   By: Ulyses Southward M.D.   On: 07/05/2014 10:24   Dg Chest 2 View  07/14/2014   CLINICAL DATA:  Chest pain  EXAM: CHEST  2 VIEW  COMPARISON:  Portable chest x-ray of July 09, 2014  FINDINGS: The lungs are adequately inflated. The atelectatic change at the lung bases has cleared. The cardiopericardial silhouette and pulmonary vascularity are normal. There is stable tortuosity of the descending thoracic aorta. The bony thorax is unremarkable.  IMPRESSION: There has been interval improvement in both lungs with resolution of basilar atelectasis. There is no evidence of CHF.   Electronically Signed   By: David  [place]   On: 07/14/2014 12:27   Dg Abd 1 View  07/08/2014   CLINICAL DATA:  Constipation.  Spinal cord ischemia.  EXAM: ABDOMEN - 1 VIEW  COMPARISON:  CT of the abdomen 07/04/2014  FINDINGS: The bowel gas pattern is normal. No radio-opaque calculi or other significant radiographic abnormality are seen. There is moderate stool burden. Visualized osseous structures have a normal appearance.  IMPRESSION: Moderate stool burden.   Electronically Signed   By: Rosalie Gums M.D.   On: 07/08/2014 18:22   Ct Angio Chest W/cm &/or Wo Cm  06/29/2014   ADDENDUM REPORT: 06/29/2014 20:00  ADDENDUM: Case was reviewed with Dr. Tyrone Sage at the time of addendum.  A 4 mm penetrating atherosclerotic ulcer is possible along the posterior aspect of the descending thoracic aorta (series 701/image 41). Although very subtle, a slight hyperdense crescent may be present on unenhanced imaging, reflecting intramural hematoma.  As such, while the findings remain compatible with acute aortic syndrome, this may reflect sequela of penetrating  atherosclerotic ulcer rather than true aortic dissection.   Electronically Signed   By: Charline Bills M.D.   On: 06/29/2014 20:00   06/29/2014   CLINICAL DATA:  Chest/back pain, weakness, nausea. History of renal disorder.  EXAM: CT ANGIOGRAPHY CHEST AND ABDOMEN  TECHNIQUE: Multidetector CT imaging of the chest and abdomen was performed using the standard protocol during bolus administration of intravenous contrast. Multiplanar CT image reconstructions and MIPs were obtained to evaluate the vascular anatomy.  CONTRAST:  OMNIPAQUE IOHEXOL 350 MG/ML SOLN  COMPARISON:  Chest radiograph dated 06/29/2014. Unenhanced CT abdomen pelvis dated 04/20/2009.  FINDINGS: CTA CHEST FINDINGS  No evidence of intramural hematoma on unenhanced CT.  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  Although not tailored for evaluation of the pulmonary arteries, there is no evidence of pulmonary embolism.  Mild dependent atelectasis at the lung bases. No suspicious pulmonary nodules. No pleural effusion or pneumothorax.  Visualized thyroid is unremarkable.  Heart is normal in size.  No pericardial effusion.  No suspicious mediastinal, hilar, or axillary lymphadenopathy.  Visualized osseous structures are  within normal limits.  Review of the MIP images confirms the above findings.  CTA ABDOMEN FINDINGS  Aortic dissection extends to the aortic bifurcation.  Celiac artery, SMA, IMA, and bilateral renal arteries all arise from the true lumen and remain patent.  Scattered hepatic cysts measuring up to 2.1 cm. Liver is otherwise within normal limits.  Spleen, pancreas, and adrenal glands are within normal limits.  Gallbladder is unremarkable. No intrahepatic or extrahepatic ductal dilatation.  Numerous bilateral renal cysts, compatible with polycystic renal disease. No hydronephrosis.  Visualized bowel is unremarkable.  No abdominal ascites.  No suspicious abdominal lymphadenopathy.   Visualized osseous structures are within normal limits.  Review of the MIP images confirms the above findings.  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  Celiac artery, SMA, IMA, and bilateral renal arteries all arise from the true lumen and remain patent.  No evidence of intramural hematoma.  Additional ancillary findings as above.  These results were called by telephone at the time of interpretation on 06/29/2014 at 6:20 pm to Dr. Azalia Bilis , who verbally acknowledged these results.  Electronically Signed: By: Charline Bills M.D. On: 06/29/2014 18:34   Ct Angio Abdomen W/cm &/or Wo Contrast  06/29/2014   ADDENDUM REPORT: 06/29/2014 20:00  ADDENDUM: Case was reviewed with Dr. Tyrone Sage at the time of addendum.  A 4 mm penetrating atherosclerotic ulcer is possible along the posterior aspect of the descending thoracic aorta (series 701/image 41). Although very subtle, a slight hyperdense crescent may be present on unenhanced imaging, reflecting intramural hematoma.  As such, while the findings remain compatible with acute aortic syndrome, this may reflect sequela of penetrating atherosclerotic ulcer rather than true aortic dissection.   Electronically Signed   By: Charline Bills M.D.   On: 06/29/2014 20:00   06/29/2014   CLINICAL DATA:  Chest/back pain, weakness, nausea. History of renal disorder.  EXAM: CT ANGIOGRAPHY CHEST AND ABDOMEN  TECHNIQUE: Multidetector CT imaging of the chest and abdomen was performed using the standard protocol during bolus administration of intravenous contrast. Multiplanar CT image reconstructions and MIPs were obtained to evaluate the vascular anatomy.  CONTRAST:  OMNIPAQUE IOHEXOL 350 MG/ML SOLN  COMPARISON:  Chest radiograph dated 06/29/2014. Unenhanced CT abdomen pelvis dated 04/20/2009.  FINDINGS: CTA CHEST FINDINGS  No evidence of intramural hematoma on unenhanced CT.   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  Although not tailored for evaluation of the pulmonary arteries, there is no evidence of pulmonary embolism.  Mild dependent atelectasis at the lung bases. No suspicious pulmonary nodules. No pleural effusion or pneumothorax.  Visualized thyroid is unremarkable.  Heart is normal in size.  No pericardial effusion.  No suspicious mediastinal, hilar, or axillary lymphadenopathy.  Visualized osseous structures are within normal limits.  Review of the MIP images confirms the above findings.  CTA ABDOMEN FINDINGS  Aortic dissection extends to the aortic bifurcation.  Celiac artery, SMA, IMA, and bilateral renal arteries all arise from the true lumen and remain patent.  Scattered hepatic cysts measuring up to 2.1 cm. Liver is otherwise within normal limits.  Spleen, pancreas, and adrenal glands are within normal limits.  Gallbladder is unremarkable. No intrahepatic or extrahepatic ductal dilatation.  Numerous bilateral renal cysts, compatible with polycystic renal disease. No hydronephrosis.  Visualized bowel is unremarkable.  No abdominal ascites.  No suspicious abdominal lymphadenopathy.  Visualized osseous structures are within normal limits.  Review of the MIP images confirms the above findings.  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  Celiac artery, SMA, IMA, and bilateral renal arteries all arise from the true lumen and remain patent.  No evidence of intramural hematoma.  Additional ancillary findings as above.  These results were called by telephone at the time of interpretation on 06/29/2014 at 6:20 pm to Dr. Azalia Bilis , who verbally acknowledged these results.  Electronically Signed: By: Charline Bills M.D. On: 06/29/2014 18:34   Dg Chest Port 1 View  07/09/2014   CLINICAL DATA:  Chest pain and shortness of Breath.   EXAM: PORTABLE CHEST - 1 VIEW  COMPARISON:  07/06/2014  FINDINGS: Mild basilar opacity, greater on the left, is consistent with atelectasis. No significant pleural fluid. No pneumothorax. No evidence of pulmonary edema or pneumonia.  Cardiac silhouette is normal in size. No mediastinal or hilar masses.  IMPRESSION: Mild stable basilar atelectasis, greater on the left. No evidence of pneumonia or edema. No pneumothorax. No change from the prior study.   Electronically Signed   By: Amie Portland M.D.   On: 07/09/2014 08:12   Dg Chest Port 1 View  07/06/2014   CLINICAL DATA:  Left-sided chest pain, recent thoracentesis  EXAM: PORTABLE CHEST - 1 VIEW  COMPARISON:  Chest radiograph 8 14 2015  FINDINGS: No evidence of left-sided pneumothorax. There is interval decrease in pleural fluid on the left. Decrease in atelectasis. Right lung is clear.  IMPRESSION: No complication following left thoracentesis. No pneumothorax. Improved aeration left lung base.   Electronically Signed   By: Genevive Bi M.D.   On: 07/06/2014 19:14   Dg Chest Port 1 View  07/04/2014   CLINICAL DATA:  Reassess atelectasis  EXAM: PORTABLE CHEST - 1 VIEW  COMPARISON:  . Chest CT scan of June 29, 2014 portable chest x-ray of the same date  FINDINGS: The lungs are less well inflated today. The interstitial markings are mildly increased especially on the left. The retrocardiac region is dense consistent with atelectasis or pneumonia. The left hemidiaphragm is now obscured. The cardiac silhouette is mildly enlarged. The aortic knob is indistinct consistent with the known type B aortic dissection. The pulmonary vascularity is mildly engorged.  IMPRESSION: 1. There is left lower lobe atelectasis and/or pneumonia. 2. Increased pulmonary interstitial markings and mild cardiomegaly are consistent with CHF. 3. Indistinct aortic knob is consistent with the known aortic dissection.   Electronically Signed   By: David  [place]   On: 07/04/2014 08:05    Dg Chest Port 1 View  06/29/2014   CLINICAL DATA:  Chest pain  EXAM: PORTABLE CHEST - 1 VIEW  COMPARISON:  04/13/2010  FINDINGS: Lungs are clear.  No pleural effusion or pneumothorax.  The heart is normal in size.  IMPRESSION: No evidence of acute cardiopulmonary disease.   Electronically Signed   By: Charline Bills M.D.   On: 06/29/2014 17:16   Ct Angio Chest Aortic Dissect W &/or W/o  07/04/2014   CLINICAL DATA:  Type 3 aortic dissection.  Worsening chest pain.  EXAM: CT ANGIOGRAPHY CHEST, ABDOMEN AND PELVIS  TECHNIQUE: Multidetector CT imaging through the chest, abdomen and pelvis was performed using the standard protocol during bolus administration of intravenous contrast. Multiplanar reconstructed images and MIPs were obtained and reviewed to evaluate the vascular anatomy.  CONTRAST:   OMNIPAQUE IOHEXOL 350 MG/ML SOLN  COMPARISON:  06/29/2014.  FINDINGS: CTA CHEST FINDINGS  Small intramural hematoma is identified on precontrast imaging. This was present on the prior exam although it is better visualized due to technique on today's study. The intramural hematoma extends from the aortic arch to the upper abdomen.  Intramural hematoma appears slightly decreased in thickness compared to the prior exam 06/29/2014. The intramural hematoma extends from the arch into the upper abdomen. There is no visible dissection flap. The small area of proximal descending thoracic aortic contrast suspicious for penetrating atherosclerotic ulcer is no longer visible on today's study.  In the interval since the prior exam, moderate to large LEFT and small RIGHT pleural effusions have developed with atelectasis. The pleural effusions are low-attenuation. There is no hemopericardium or hemothorax in this patient with acute aortic syndrome. Heart appears mildly enlarged and increased in size compared to prior, compatible with volume overload. The lungs show compressive atelectasis but no areas of consolidation. Coronary  artery atherosclerosis is present. If office based assessment of coronary risk factors has not been performed, it is now recommended.  Review of the MIP images confirms the above findings.  CTA ABDOMEN AND PELVIS FINDINGS  Abdominal aortic atherosclerosis without aneurysm. No dissection flap is identified in the abdominal aorta. Bilateral iliofemoral mild atherosclerosis. No occlusion. Celiac axis, superior mesenteric artery and inferior mesenteric artery are patent.  Polycystic kidney disease is present with associated liver cysts. There is a large calculus in the LEFT inferior renal pole measuring 13 mm. This may be contained within a cyst or an a partially obstructed collecting system or calyceal diverticulum. Bowel appears within normal limits. Liver and spleen normal. Small amount of ascites, probably secondary to volume overload. Mild anasarca changes are present in the subcutaneous tissues.  Review of the MIP images confirms the above findings.  IMPRESSION: 1. Evolving descending thoracic aortic intramural hematoma. Mildly decreased intramural hematoma evident on noncontrast imaging. Penetrating atherosclerotic ulcer is no longer visualized. No dissection flap. 2. Development of moderate to large LEFT pleural effusion and small RIGHT pleural effusion. Both effusions layer dependently and produce substantial compressive atelectasis. 3. Polycystic kidney disease with associated liver cysts.   Electronically Signed   By: Andreas Newport M.D.   On: 07/04/2014 16:08   Ct Angio Abd/pel W/ And/or W/o  07/04/2014   CLINICAL DATA:  Type 3 aortic dissection.  Worsening chest pain.  EXAM: CT ANGIOGRAPHY CHEST, ABDOMEN AND PELVIS  TECHNIQUE: Multidetector CT imaging through the chest, abdomen and pelvis was performed using the standard protocol during bolus administration of intravenous contrast. Multiplanar reconstructed images and MIPs were obtained and reviewed to evaluate the vascular anatomy.  CONTRAST:   OMNIPAQUE IOHEXOL 350 MG/ML SOLN  COMPARISON:  06/29/2014.  FINDINGS: CTA CHEST FINDINGS  Small intramural hematoma is identified on precontrast imaging. This was present on the prior exam although it is better visualized due to technique on today's study. The intramural hematoma extends from the aortic arch to the upper abdomen.  Intramural hematoma appears slightly decreased in thickness compared to the prior exam 06/29/2014. The intramural hematoma extends from the arch into the upper abdomen. There is no visible dissection flap. The small area of proximal descending thoracic aortic contrast suspicious for penetrating atherosclerotic ulcer is no longer visible on today's study.  In the interval since the prior exam, moderate to large LEFT and small RIGHT pleural effusions have developed with atelectasis. The pleural effusions are low-attenuation. There is no hemopericardium or  hemothorax in this patient with acute aortic syndrome. Heart appears mildly enlarged and increased in size compared to prior, compatible with volume overload. The lungs show compressive atelectasis but no areas of consolidation. Coronary artery atherosclerosis is present. If office based assessment of coronary risk factors has not been performed, it is now recommended.  Review of the MIP images confirms the above findings.  CTA ABDOMEN AND PELVIS FINDINGS  Abdominal aortic atherosclerosis without aneurysm. No dissection flap is identified in the abdominal aorta. Bilateral iliofemoral mild atherosclerosis. No occlusion. Celiac axis, superior mesenteric artery and inferior mesenteric artery are patent.  Polycystic kidney disease is present with associated liver cysts. There is a large calculus in the LEFT inferior renal pole measuring 13 mm. This may be contained within a cyst or an a partially obstructed collecting system or calyceal diverticulum. Bowel appears within normal limits. Liver and spleen normal. Small amount of ascites, probably  secondary to volume overload. Mild anasarca changes are present in the subcutaneous tissues.  Review of the MIP images confirms the above findings.  IMPRESSION: 1. Evolving descending thoracic aortic intramural hematoma. Mildly decreased intramural hematoma evident on noncontrast imaging. Penetrating atherosclerotic ulcer is no longer visualized. No dissection flap. 2. Development of moderate to large LEFT pleural effusion and small RIGHT pleural effusion. Both effusions layer dependently and produce substantial compressive atelectasis. 3. Polycystic kidney disease with associated liver cysts.   Electronically Signed   By: Andreas Newport M.D.   On: 07/04/2014 16:08   US Thoracentesis Asp Pleural Space W/img Guide  07/05/2014   CLINICAL DATA:  Left pleural effusion  EXAM: ULTRASOUND GUIDED left THORACENTESIS  COMPARISON:  None.  PROCEDURE: An ultrasound guided thoracentesis was thoroughly discussed with the patient and questions answered. The benefits, risks, alternatives and complications were also discussed. The patient understands and wishes to proceed with the procedure. Written consent was obtained.  Ultrasound was performed to localize and mark an adequate pocket of fluid in the left chest. The area was then prepped and draped in the normal sterile fashion. 1% Lidocaine was used for local anesthesia. Under ultrasound guidance a 19 gauge Yueh catheter was introduced. Thoracentesis was performed. The catheter was removed and a dressing applied.  Complications:  None.  FINDINGS: A total of approximately 300 cc of blood tinged fluid was removed. A fluid sample wassent for laboratory analysis.  IMPRESSION: Successful ultrasound guided left thoracentesis yielding 300 cc of pleural fluid.  Read by:  Robet Leu [facility]   Electronically Signed   By: Irish Lack M.D.   On: 07/05/2014 11:51    ASSESSMENT AND PLAN  1. 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  - bilateral LE pulse 2+ exam reassuring  - HCTZ added, SBP better than last week, however continue to be 130-150s. Continue hydralazine and labetalol - unclear SBP goal in the setting of dissection and spinal cord ischemia, normally SBP goal with dissection 100-120s, however unclear with spinal cord ischemia   2. hypertensive heart disease   3. polycystic kidney disease with chronic kidney disease stage III   4. prior tobacco abuse: educated on importance of tobacco cessation   5. hyperlipidemia with LDL cholesterol 129.   6. Recent chest discomfort negative trop without EKG changes   - unlikely to be ACS, plus pt had recent dissection and inability to start any dual antiplatelet med anytime soon   Hassan Buckler Pager: [phone]   History and  all data above reviewed.  Patient examined.  I agree with the findings as above.  She denies any pain.  She has no SOB.  The patient exam reveals COR:RRR  ,  Lungs: Clear  ,  Abd: Positive bowel sounds, no rebound no guarding, Ext No edema  .  All available labs, radiology testing, previous records reviewed. Agree with documented assessment and plan. HTN:  I will increase the hydralazine.  The more important issue will be continued HTN rather than lower BPs.   Fayrene Fearing Breckyn Ticas  12:48 PM  07/15/2014

## 2014-07-15 NOTE — Progress Notes (Signed)
Occupational Therapy Session Note  Patient Details  Name: Charlotte [name] MRN: [id number] Date of Birth: 26-May-1966  Today's Date: 07/15/2014 OT Individual Time: 1115-1200 OT Individual Time Calculation (min): 45 min   Short Term Goals: Week 1:  OT Short Term Goal 1 (Week 1): Pt will perform upper body bathing and dressing with setup assist OT Short Term Goal 2 (Week 1): Pt will complete lower body bathing supine using AE with mod assist OT Short Term Goal 3 (Week 1): Pt will complete transfer on/off tub bench with mod assist OT Short Term Goal 4 (Week 1): Pt will complete lower body dressing with min assist OT Short Term Goal 5 (Week 1): Pt will complete toileting with min assist  Skilled Therapeutic Interventions/Progress Updates: ADL-retraining with focus on shower-level bathing, transfers, and endurance.   Pt received supine in bed with brother-in-law present in room.   With setup assist and mod verbal instructional cues, pt agreed to bathing in walk-in shower using tub bench.   Pt required mod assist to lift legs out of bed although attempting to use leg lifter as instructed.  Pt completed lateral transfer to her right to w/c and transferred again from w/c to tub bench with steadying assist and min verbal cues to attend to foot placement.   Pt completed bathing seated, using lateral leans and peri-area cut-out to wash buttocks.   Pt dressed at sink side but elected to remain in gown using disposable undergarments.   Pt completed static standing for 30 seconds with min assist as OT pulled up her underwear for her.   OT then advised brother-in-law of need for clothing to practice dressing which prompted pt to declare that she had clothing available in her backpack.   Pt agreed to practice dressing during f/u visit after lunch.   Pt returned to bed from w/c to recover d/t depleted energy from bathing this session; call light placed within reach.    Therapy Documentation Precautions:   Precautions Precautions: Fall Restrictions Weight Bearing Restrictions: No  Pain: Pain Assessment Pain Assessment: 0-10 Pain Score: 5  Pain Location: Leg Pain Orientation: Right;Left Pain Descriptors / Indicators: Burning;Tingling Pain Onset: On-going Patients Stated Pain Goal: 4 Pain Intervention(s): Medication (See eMAR)  ADL: ADL ADL Comments: see FIM  See FIM for current functional status  Therapy/Group: Individual Therapy  Second session: Time: 1300-1335 Time Calculation (min):  35 min  Pain Assessment: No/denies pain  Skilled Therapeutic Interventions: ADL-retraining with focus on dressing skills (supine), bed mobility, transfers, and improved sitting tolerance.   Pt received supine in bed with food tray; pt ate less than 10% of lunch.   OT re-educated pt on benefit of dressing and advised grounds pass to improve affect with change of environment (sunlight and grounds).   Pt became motivated with encouragement from brother-in-law and she attempted dressing supine with setup.   Pt required max assist to don pants up to her knees but she attempt to pull them over her hips using rolls (right <> left) to complete task as instructed.   Pt fastened pants unassisted but required max assist to don shoes and tie laces.   Pt donned bra and shirt with only setup after mod assist to manage legs to rise from supine to sitting at edge of bed.   Pt performed lateral transfer to w/c with instructional cues and steadying assist as she tends to lean back during transfers.    Pt was escorted in w/c from room to central lobby  via sidewalk inorder to orient her brother-in-law to grounds, pending requested grounds pass.   Pt left in w/c in her room awaiting final therapy session.   OT notified RN of pt's expressed desire to smoke when outside; pt was educated on tobacco free campus with OT while briefly off ward.   See FIM for current functional status  Therapy/Group: Individual  Therapy  Rashun Grattan 07/15/2014, 1:40 PM

## 2014-07-15 NOTE — Plan of Care (Signed)
Problem: SCI BOWEL ELIMINATION Goal: RH STG SCI MANAGE BOWEL PROGRAM W/ASSIST OR AS APPROPRIATE STG SCI Manage bowel program w/Min assist or as appropriate.  Outcome: Not Progressing Refusing senna and supp; reports dtr will be person to train for bowel issues if needed

## 2014-07-16 ENCOUNTER — Inpatient Hospital Stay (HOSPITAL_COMMUNITY): Payer: Medicaid Other

## 2014-07-16 ENCOUNTER — Inpatient Hospital Stay (HOSPITAL_COMMUNITY): Payer: Medicaid Other | Admitting: *Deleted

## 2014-07-16 ENCOUNTER — Inpatient Hospital Stay (HOSPITAL_COMMUNITY): Payer: Self-pay

## 2014-07-16 DIAGNOSIS — F1410 Cocaine abuse, uncomplicated: Secondary | ICD-10-CM

## 2014-07-16 DIAGNOSIS — G9519 Other vascular myelopathies: Secondary | ICD-10-CM

## 2014-07-16 DIAGNOSIS — G9782 Other postprocedural complications and disorders of nervous system: Secondary | ICD-10-CM

## 2014-07-16 DIAGNOSIS — I10 Essential (primary) hypertension: Secondary | ICD-10-CM

## 2014-07-16 NOTE — Patient Care Conference (Signed)
Inpatient RehabilitationTeam Conference and Plan of Care Update Date: 07/16/2014   Time: 2:25  PM    Patient Name: Charlotte [name]      Medical Record Number: [id number]  Date of Birth: 1966-07-30 Sex: Female         Room/Bed: 4W18C/4W18C-01 Payor Info: Payor: MEDICAID PENDING / Plan: MEDICAID PENDING / Product Type: *No Product type* /    Admitting Diagnosis: Spinal cord infarct  Admit Date/Time:  07/08/2014  4:54 PM Admission Comments: No comment available   Primary Diagnosis:  <principal problem not specified> Principal Problem: <principal problem not specified>  Patient Active Problem List   Diagnosis Date Noted  . 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 Polycystic kidney disease 06/29/2014  . 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 Aortic dissection 06/29/2014  . ANEMIA 02/09/2011  . TOBACCO ABUSE 02/09/2011  . COCAINE ABUSE 02/09/2011  . HYPERTENSION, ESSENTIAL 02/09/2011  . NAUSEA 02/09/2011    Expected Discharge Date: Expected Discharge Date: 07/26/14  Team Members Present: Physician leading conference: Dr. Faith [name] Social Worker Present: Charlotte Jupiter, LCSW Nurse Present: Charlotte Purl, RN PT Present: Charlotte [name], PT;Charlotte [name], PT OT Present: Charlotte [name], OT SLP Present: Charlotte [name], SLP PPS Coordinator present : Charlotte Duck, RN, CRRN     Current Status/Progress Goal Weekly Team Focus  Medical   still with stamina and emotional issues. mood better but still psych component to participation  see prior  egosupport, educagtion   Bowel/Bladder   continent of bowel; last bm 8/23. Usually continent of bladder, occasionally incontinent due to urgency  Remain conntinent of bowel and bladder,   bladeer retraining, continue to access bowel pattern   Swallow/Nutrition/ Hydration             ADL's   Mod assist  with BADL (seated/supine) and transfers  Overall Supervision-Min A  Endurance, AE training, sitting balance, lower body bathing/dressing skills   Mobility   MinA scoot transfer, ModA sit/stand, S' w/c mobility  mod I w/c management; S' transfers and short distance ambulation  activity tolerance, bed mobility, BLE strength/coordination   Communication             Safety/Cognition/ Behavioral Observations            Pain   lower back and BLE, oyx 10-15mg  q4 prn  pain less than or equal to 4 on scale 19f 0-10  Access pain q shift and prn   Skin   surgical puncture site to mid lower back OTA; skin on bilat feet dry and flakey  remain free of s/s of breakdown  access q shift and prn    Rehab Goals Patient on target to meet rehab goals: Yes *See Care Plan and progress notes for long and short-term goals.  Barriers to Discharge: continue to provde education and support    Possible Resolutions to Barriers:  family ed, NMR    Discharge Planning/Teaching Needs:  Plans to return home with her daughter, Charlotte [name], who is available to provide 24/7 assistance  plan ed sessions closer to d/c.   Team Discussion:  Not feeling well this afternoon.  Very poor endurance overall.  C/o CP.  Poor sitting balance.  Planning to d/c ambulation goals.  Anticipate family will need to provide min assist w/c level.  SW to follow up with daughter to make sure she feels able to provide this  level of care.  Revisions to Treatment Plan:  D/c ambulation goals.   Continued Need for Acute Rehabilitation Level of Care: The patient requires daily medical management by a physician with specialized training in physical medicine and rehabilitation for the following conditions: Daily direction of a multidisciplinary physical rehabilitation program to ensure safe treatment while eliciting the highest outcome that is of practical value to the patient.: Yes Daily medical management of patient stability for increased activity  during participation in an intensive rehabilitation regime.: Yes Daily analysis of laboratory values and/or radiology reports with any subsequent need for medication adjustment of medical intervention for : Post surgical problems;Cardiac problems;Neurological problems  Charlotte [name] 07/16/2014, 4:21 PM

## 2014-07-16 NOTE — Progress Notes (Addendum)
Physical Therapy Weekly Progress Note  Patient Details  Name: Charlotte [name] MRN: [id number] Date of Birth: 09-21-1966  Beginning of progress report period: July 09, 2014 End of progress report period: July 16, 2014  Today's Date: 07/16/2014 PT Individual Time: 1000-1100 PT Individual Time Calculation (min): 60 min   Patient has met 1 of 5 short term goals.  Pt making progress w/ functional mobility, but limited by decreased activity tolerance limiting participation, decreased strength/muscular endurance in BLE. Patient tolerating 45-50 minutes of therapeutic activity at a time. Pt is not demonstrating motor return in lower extremities to the extent that she will be able to stand functionally, so discharging standing and ambulation goals.   Patient continues to demonstrate the following deficits: decreased activity tolerance, decreased strength in BLE, anxiety around movement, decreased trunk control and seated balance and therefore will continue to benefit from skilled PT intervention to enhance overall performance with activity tolerance, balance, postural control and functional use of  right lower extremity and left lower extremity.  Patient progressing toward long term goals..  Continue plan of care.  PT Short Term Goals Week 1:  PT Short Term Goal 1 (Week 1): Pt will tolerate 60 minutes of therapeutic activity PT Short Term Goal 1 - Progress (Week 1): Progressing toward goal PT Short Term Goal 2 (Week 1): Pt will perform all bed mobility w/ MinA PT Short Term Goal 2 - Progress (Week 1): Progressing toward goal PT Short Term Goal 3 (Week 1): Pt will tolerate sitting up in wheelchair for 3 hrs at a time PT Short Term Goal 3 - Progress (Week 1): Met PT Short Term Goal 4 (Week 1): Pt will consistently transfer bed<>w/c w/ MinA PT Short Term Goal 4 - Progress (Week 1): Progressing toward goal PT Short Term Goal 5 (Week 1): Pt will perform sit<>stand w/ ModA PT Short Term Goal 5 -  Progress (Week 1): Not progressing Week 2:  PT Short Term Goal 1 (Week 2): Pt will tolerate 60 minutes of therapeutic activity PT Short Term Goal 2 (Week 2): Pt will consistently transfer bed<>w/c w/ MinA PT Short Term Goal 3 (Week 2): Pt will perform all bed mobility w/ S w/ use of AE PT Short Term Goal 4 (Week 2): Pt will tolerate and participate in 180 minutes of therapy per day  Skilled Therapeutic Interventions/Progress Updates:    Pt received seated in w/c, agreeable to participate in therapy after max encouragement from therapist. Pt propelled w/c 100'x2 w/ rest break in between to ADL apartment. Set up w/c for transfer w/ ModA, then transferred via lateral scoot w/ MinA. Performed blocked practice for bed mobility x5 ea way rolling L and R, min VC's for sequencing. Pt w/ BLE ex's while supine in bed including assisted heel slides, assisted supine hip abd, quad sets x10 ea. Pt w/ lateral scoot transfer w/ MinA bed>w/c, then propelled w/c 200' back to room. In room pt completed x3 sit<>stands w/ STEDI lift w/ MaxA for each stand. Pt able to tolerate standing for ~10 seconds at a time w/ Mod-MaxA to remain standing. Pt transferred to bed w/ use of STEDI lift, ModA to move sit>supine. Pt left supine in bed w/ all needs within reach.  Therapy Documentation Precautions:  Precautions Precautions: Fall Restrictions Weight Bearing Restrictions: No Vital Signs: Therapy Vitals Temp: 98.3 F (36.8 C) Temp src: Oral Resp: 18 Pain: Pain Assessment Pain Score: Asleep Pain Location: Leg Pain Orientation: Right;Left Pain Descriptors / Indicators: Aching Pain Intervention(s):  Medication (See eMAR)  See FIM for current functional status  Therapy/Group: Individual Therapy  Rada Hay Rada Hay, PT, DPT 07/16/2014, 7:44 AM

## 2014-07-16 NOTE — Plan of Care (Signed)
Problem: SCI BOWEL ELIMINATION Goal: RH STG MANAGE BOWEL WITH ASSISTANCE STG Manage Bowel with Min Assistance.  Outcome: Not Progressing Patient refuses suppository

## 2014-07-16 NOTE — Progress Notes (Signed)
Patient complained of sharp chest pain at the sternal notch. No radiating pain or numbness, vital signs:BP=143/61, HR=67, temp=98.4 f, RR=18, SPO2=94% on room air. EKG indicated NSR, with T wave abnormality. Dan A. PA, called and notified of these results.

## 2014-07-16 NOTE — Progress Notes (Signed)
Occupational Therapy Session & Weekly Progress Note  Patient Details  Name: Charlotte [name] MRN: [id number] Date of Birth: 1966-02-28  Beginning of progress report period: July 09, 2014 End of progress report period: July 16, 2014  Today's Date: 07/16/2014 OT Individual Time: 0729-0829 OT Individual Time Calculation (min): 60 min   Patient has met 4 of 5 short term goals.  Patient is progressing with lower body dressing however performance is variable d/t poor endurance.  Patient continues to demonstrate the following deficits: Impaired endurance, impaired transfer skills, impaired performance of lower body bathing/dressing tasks, impaired dynamic sitting balance and therefore will continue to benefit from skilled OT intervention to enhance overall performance with BADL.  Patient progressing toward long term goals..  Continue plan of care.  OT Short Term Goals Week 1:  OT Short Term Goal 1 (Week 1): Pt will perform upper body bathing and dressing with setup assist OT Short Term Goal 1 - Progress (Week 1): Met OT Short Term Goal 2 (Week 1): Pt will complete lower body bathing supine using AE with mod assist OT Short Term Goal 2 - Progress (Week 1): Met OT Short Term Goal 3 (Week 1): Pt will complete transfer on/off tub bench with mod assist OT Short Term Goal 3 - Progress (Week 1): Met OT Short Term Goal 4 (Week 1): Pt will complete lower body dressing with min assist OT Short Term Goal 4 - Progress (Week 1): Progressing toward goal OT Short Term Goal 5 (Week 1): Pt will complete toileting with min assist OT Short Term Goal 5 - Progress (Week 1): Met Week 2:  OT Short Term Goal 1 (Week 2): Patient will complete lower body dressing with min assist using AE prn OT Short Term Goal 2 (Week 2): Patient will complete transfer on/off tub bench in standard tub with min assist to manage BLE OT Short Term Goal 3 (Week 2): Patient will demo ability to complete HEP for BUE strengthening and  endurance with supervision OT Short Term Goal 4 (Week 2): Pt will demo ability to retrieve beverage and/or food items as needed for simple meal prep at w/c level  Skilled Therapeutic Interventions/Progress Updates: ADL-retraining with focus on improved transfers, weight-shifting (anterior/posterior), management of BLE, endurance, dynamic sitting balance, and assisted sit<>stand   Pt received supine in bed, willing to dress only this session due to mild discomfort related to inadequate thermoregulation.   Pt completed transfer from bed to w/c with mod assist to lower safely onto cushion and verbal cues for technique/positioning during transfer.   Pt requires close supervision and assist with transfers due to inadequately estimating position of lower body over cushion and/or edge of bed with limited dynamic sitting balance and rapid fatigue from exertion.   Pt complete dressing seated in w/c this session and stood supported briefly, with max assist to provide stablization, while OT assisted with donning underwear.   Pt remained in w/c at end of session requesting to return to bed however accepted guidance to remain seated to improve sitting tolerance.   Pt left with call light and phone within reach.      Therapy Documentation Precautions:  Precautions Precautions: Fall Restrictions Weight Bearing Restrictions: No  Vital Signs: Therapy Vitals Temp: 98 F (36.7 C) Temp src: Oral Pulse Rate: 73 Resp: 18 BP: 125/76 mmHg Patient Position (if appropriate): Lying Oxygen Therapy SpO2: 97 %  Pain: No/denies pain   ADL: ADL ADL Comments: see FIM  See FIM for current functional status  Therapy/Group: Individual Therapy  Second session: Time: 1300-1330 Time Calculation (min):  30 min  Pain Assessment: 8/10, chest pain, rest  Skilled Therapeutic Interventions: ADL-retraining with focus on adapted toileting (bed pan), bed mobility, and symptom management.    Pt received supine in bed  initially refusing therapy but willing to engage after remotivation and change in treatment method.   Pt reported chest pain and poor thermoregulation (feeling "chills") resulting in mild nausea.   Pt had not eaten from lunch tray d/t poor appetite but agreed to place a more suitable request for evening meal in order to improve PO intake.   OT provided total assist review of menu and initiating call for revised evening meal (grilled cheese sandwhich, fruit cocktail, cranberry juice drink).    Pt then requested assist with doffing clothing and toileting using bed pan.   Pt remained supine and completed bed mobility with mod assist to move both legs to sit on bed pan and to remove clothing to replace with gown.  Pt voided urine and performed hygiene with only setup assist.    Pt left in bed with call light and phone placed within reach.   Orthostatics assessed initially: 153/68, 67 bpm, 97%).   See FIM for current functional status  Therapy/Group: Individual Therapy  [place] 07/16/2014, 3:10 PM

## 2014-07-16 NOTE — Progress Notes (Signed)
[facility]ILITATION     PROGRESS NOTE    Subjective/Complaints: No new complaints.  Needs bm--refuses senna A  review of systems has been performed and if not noted above is otherwise negative.    Objective: Vital Signs: Blood pressure 136/81, pulse 72, temperature 98.3 F (36.8 C), temperature source Oral, resp. rate 18, height  (1.651 m), weight 69.582 kg (153 lb 6.4 oz), SpO2 98.00%. Dg Chest 2 View  07/14/2014   CLINICAL DATA:  Chest pain  EXAM: CHEST  2 VIEW  COMPARISON:  Portable chest x-ray of July 09, 2014  FINDINGS: The lungs are adequately inflated. The atelectatic change at the lung bases has cleared. The cardiopericardial silhouette and pulmonary vascularity are normal. There is stable tortuosity of the descending thoracic aorta. The bony thorax is unremarkable.  IMPRESSION: There has been interval improvement in both lungs with resolution of basilar atelectasis. There is no evidence of CHF.   Electronically Signed   By: David  [place]   On: 07/14/2014 12:27   No results found for this basename: WBC, HGB, HCT, PLT,  in the last 72 hours No results found for this basename: NA, K, CL, CO, GLUCOSE, BUN, CREATININE, CALCIUM,  in the last 72 hours CBG (last 3)  No results found for this basename: GLUCAP,  in the last 72 hours  Wt Readings from Last 3 Encounters:  07/10/14 69.582 kg (153 lb 6.4 oz)  07/07/14 66.588 kg (146 lb 12.8 oz)  02/09/11 59.648 kg (131 lb 8 oz)    Physical Exam:  Constitutional: [name], place, and time. She appears well-developed and well-nourished.  HENT: oral mucosa moist Head: Normocephalic and atraumatic.  Eyes: Conjunctivae are normal. Pupils are equal, round, and reactive to light.  Neck: Normal range of motion. Neck supple.  Cardiovascular: Regular rhythm and rate  Respiratory: Effort normal and breath sounds normal. No respiratory distress. She has no wheezes. No rales GI: Soft. Bowel sounds  are normal. She exhibits no distension. There is no tenderness.  Musculoskeletal: She exhibits no edema. Tenderness to palpation over sternum and xyphoid Neurological: She is alert and oriented to person, place, and time.  LLE>RLE weakness.  3- R to 2- /5  L with HF/KE,  2+/5 PF/DF.  Upper extremities strength5/5 deltoid, bicep, tricep, grip   Skin: Skin is warm and dry.  Psychiatric: more pleasant and up beat.   Assessment/Plan: 1. Functional deficits secondary to thoracic spinal cord infarct causing paraplegia which requires 3+ hours per day of interdisciplinary therapy in a comprehensive inpatient rehab setting. Physiatrist is providing close team supervision and 24 hour management of active medical problems listed below. Physiatrist and rehab team continue to assess barriers to discharge/monitor patient progress toward functional and medical goals. FIM: FIM - Bathing Bathing Steps Patient Completed: Chest;Right Arm;Left Arm;Abdomen;Front perineal area;Buttocks;Right upper leg;Left upper leg Bathing: 4: Min-Patient completes 8-9 55f 10 parts or 75+ percent  FIM - Upper Body Dressing/Undressing Upper body dressing/undressing steps patient completed: Thread/unthread right bra strap;Thread/unthread left bra strap;Hook/unhook bra;Thread/unthread right sleeve of pullover shirt/dresss;Thread/unthread left sleeve of pullover shirt/dress;Put head through opening of pull over shirt/dress;Pull shirt over trunk Upper body dressing/undressing: 5: Set-up assist to: Obtain clothing/put away FIM - Lower Body Dressing/Undressing Lower body dressing/undressing steps patient completed: Fasten/unfasten pants Lower body dressing/undressing: 1: Total-Patient completed less than 25% of tasks  FIM - Hotel manager Devices: Grab bar or rail for support Toileting: 1: Total-Patient completed zero steps, helper did  all 3  FIM - Diplomatic Services operational officer Devices: Bedside  commode Toilet Transfers: 0-Activity did not occur  FIM - Banker Devices: Bed rails;Arm rests Bed/Chair Transfer: 3: Supine > Sit: Mod A (lifting assist/Pt. 50-74%/lift 2 legs;4: Bed > Chair or W/C: Min A (steadying Pt. > 75%)  FIM - Locomotion: Wheelchair Distance: 150 Locomotion: Wheelchair: 5: Travels 150 ft or more: maneuvers on rugs and over door sills with supervision, cueing or coaxing FIM - Locomotion: Ambulation Locomotion: Ambulation: 0: Activity did not occur  Comprehension Comprehension Mode: Auditory Comprehension: 5-Understands basic 90% of the time/requires cueing < 10% of the time  Expression Expression Mode: Verbal Expression: 5-Expresses basic 90% of the time/requires cueing < 10% of the time.  Social Interaction Social Interaction: 4-Interacts appropriately 75 - 89% of the time - Needs redirection for appropriate language or to initiate interaction.  Problem Solving Problem Solving: 4-Solves basic 75 - 89% of the time/requires cueing 10 - 24% of the time  Memory Memory: 4-Recognizes or recalls 75 - 89% of the time/requires cueing 10 - 24% of the time  Medical Problem List and Plan:  1. 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 DVT Prophylaxis/Anticoagulation: Mechanical: Antiembolism stockings, knee (TED hose) Bilateral lower extremities  Sequential compression devices, below knee Bilateral lower extremitie   -dopplers neg 3. Pain Management: Will increase oxycodone to 15 mg and discontinue prn dilaudid.  4. Mood: LCSW to follow for evaluation and support.  5. Neuropsych: This patient is capable of making decisions on her own behalf.   -xanax prn for anxiety.  -zoloft    -+ reinforcement by team---seems to be responding to progress 6. Skin/Wound Care: Turn patient every 2 hours. Pressure relief  measures. Maintain adequate hydration and nutritional status.  7. Hyponatremia: 134  8. Acute renal failure: push po fluids. Monitor for orthostasis. Labs holding steady 9. Constipation: +BM recently---am suppository--pt refusing-----NEEDS BM TODAY 10. CV/HTN--EKG normal  -working on BP mgt  --has hx of chest pain due to hematoma,effusions prior to transfer to rehab, likely musculoskeletal component also.   -rx pain symptomatically as well  -appreciate cards assist  LOS (Days) 8 A FACE TO FACE EVALUATION WAS PERFORMED  SWARTZ,ZACHARY T 07/16/2014 8:25 AM

## 2014-07-16 NOTE — Progress Notes (Signed)
Subjective: She reports 8/10 chest tightness when exerting herself.    Objective: Vital signs in last 24 hours: Temp:  [97.9 F (36.6 C)-98.3 F (36.8 C)] 98.3 F (36.8 C) (08/25 0500) Pulse Rate:  [57-74] 74 (08/25 0843) Resp:  [17-18] 18 (08/25 0500) BP: (136-142)/(70-81) 141/79 mmHg (08/25 0843) SpO2:  [98 %-99 %] 98 % (08/24 2049) Last BM Date: 07/14/14  Intake/Output from previous day: 08/24 0701 - 08/25 0700 In: 120 [P.O.:120] Out: -  Intake/Output this shift: Total I/O In: 120 [P.O.:120] Out: -   Medications Current Facility-Administered Medications  Medication Dose Route Frequency Provider Last Rate Last Dose  . acetaminophen (TYLENOL) tablet 325-650 mg  325-650 mg Oral Q4H PRN Evlyn Kanner Love, PA-C      . albuterol (PROVENTIL) (2.5 MG/3ML) 0.083% nebulizer solution 3 mL  3 mL Inhalation Q2H PRN Jacquelynn Cree, PA-C      . ALPRAZolam Prudy Feeler) tablet 0.25 mg  0.25 mg Oral TID PRN Ranelle Oyster, MD   0.25 mg at 07/12/14 1451  . alum & mag hydroxide-simeth (MAALOX/MYLANTA) 200-200-20 MG/5ML suspension 15-30 mL  15-30 mL Oral Q2H PRN Evlyn Kanner Love, PA-C   30 mL at 07/14/14 1100  . atorvastatin (LIPITOR) tablet 20 mg  20 mg Oral q1800 Cassell Clement, MD   20 mg at 07/15/14 1702  . bisacodyl (DULCOLAX) suppository 10 mg  10 mg Rectal Q0600 Ranelle Oyster, MD   10 mg at 07/14/14 0501  . famotidine (PEPCID) tablet 20 mg  20 mg Oral BID Jacquelynn Cree, PA-C   20 mg at 07/16/14 0844  . feeding supplement (ENSURE COMPLETE) (ENSURE COMPLETE) liquid 237 mL  237 mL Oral TID BM Evlyn Kanner Love, PA-C   237 mL at 07/15/14 1956  . gabapentin (NEURONTIN) capsule 100 mg  100 mg Oral TID Jacquelynn Cree, PA-C   100 mg at 07/16/14 1610  . guaiFENesin-dextromethorphan (ROBITUSSIN DM) 100-10 MG/5ML syrup 5-10 mL  5-10 mL Oral Q6H PRN Jacquelynn Cree, PA-C      . hydrALAZINE (APRESOLINE) tablet 100 mg  100 mg Oral 3 times per day Rollene Rotunda, MD   100 mg at 07/16/14 0601  .  hydrochlorothiazide (MICROZIDE) capsule 12.5 mg  12.5 mg Oral Daily Donato Schultz, MD   12.5 mg at 07/16/14 0843  . labetalol (NORMODYNE) tablet 300 mg  300 mg Oral BID Jacquelynn Cree, PA-C   300 mg at 07/16/14 9604  . methocarbamol (ROBAXIN) tablet 500 mg  500 mg Oral Q6H PRN Jacquelynn Cree, PA-C   500 mg at 07/16/14 5409  . nicotine (NICODERM CQ - dosed in mg/24 hours) patch 14 mg  14 mg Transdermal Daily Jacquelynn Cree, PA-C   14 mg at 07/16/14 0844  . oxyCODONE (Oxy IR/ROXICODONE) immediate release tablet 10-15 mg  10-15 mg Oral Q4H PRN Jacquelynn Cree, PA-C   15 mg at 07/16/14 0847  . polyethylene glycol (MIRALAX / GLYCOLAX) packet 17 g  17 g Oral QPC supper Jacquelynn Cree, PA-C   17 g at 07/15/14 1702  . prochlorperazine (COMPAZINE) tablet 5-10 mg  5-10 mg Oral Q6H PRN Jacquelynn Cree, PA-C   10 mg at 07/14/14 0501   Or  . prochlorperazine (COMPAZINE) injection 5-10 mg  5-10 mg Intramuscular Q6H PRN Jacquelynn Cree, PA-C       Or  . prochlorperazine (COMPAZINE) suppository 12.5 mg  12.5 mg Rectal Q6H PRN Jacquelynn Cree, PA-C      .  sertraline (ZOLOFT) tablet 50 mg  50 mg Oral Daily Jacquelynn Cree, PA-C   50 mg at 07/16/14 1610  . traMADol (ULTRAM) tablet 50 mg  50 mg Oral Q6H PRN Jacquelynn Cree, PA-C      . 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   2. hypertensive heart disease   BP range 136/81 - 142/70.  HCTZ 12.5, hydralazine increased yesterday to  Q8, labetalol  300bid 3. polycystic kidney disease with chronic kidney disease stage III  4. prior tobacco abuse 5. hyperlipidemia with LDL cholesterol 129.  On lipitor 6. She reports Chest tightness when exerting during therapy.  It was 8/10 and resolved with rest.     Angina or related to dissection/movement?  It appears under her right breast..  She is on BB and  statin.   OP nuc when recovered?      LOS: 8 days    HAGER, BRYAN PA-C 07/16/2014 11:49 AM  History and all data above reviewed.  Patient examined.  I agree with the findings as above.  She is having pain that she reports having everyday since she has been in rehab.  She cannot qualify it very well but she reports that it is severe.  She has The patient exam reveals COR:RRR  ,  Lungs: Clear  ,  Abd: Positive bowel sounds, no rebound no guarding, Ext No edema  .  All available labs, radiology testing, previous records reviewed. Agree with documented assessment and plan. I have reviewed extensively the two CTs and notes from earlier this month.  She had a decrease in the size of the intramural hematoma of her aorta from 8/8 to 8/13.  She has complained about chest pain since before the 8/13 CT which was done to evaluate this pain.  She tells me that this pain is the same pain that she has been having all along and probably since that study.  Therefore, I am not inclined to repeat the CT unless we can identify a change in her pain from previous.  If it gets worse rather than better we will repeat the CT.    Rollene Rotunda  12:54 PM  07/16/2014

## 2014-07-16 NOTE — Plan of Care (Signed)
Problem: RH Balance Goal: LTG Patient will maintain dynamic standing balance (PT) LTG: Patient will maintain dynamic standing balance with assistance during mobility activities (PT)  Outcome: Not Met (add Reason) Goal D/C'ed 8/25 due to lack of progress  Problem: RH Ambulation Goal: LTG Patient will ambulate in controlled environment (PT) LTG: Patient will ambulate in a controlled environment, # of feet with assistance (PT).  Outcome: Not Met (add Reason) Goal D/C'ed 07/16/14 due to lack of pt progress Goal: LTG Patient will ambulate in home environment (PT) LTG: Patient will ambulate in home environment, # of feet with assistance (PT).  Outcome: Not Met (add Reason) Goal D/C'ed 07/16/14 due to lack of pt progress

## 2014-07-17 ENCOUNTER — Inpatient Hospital Stay (HOSPITAL_COMMUNITY): Payer: Self-pay

## 2014-07-17 ENCOUNTER — Inpatient Hospital Stay (HOSPITAL_COMMUNITY): Payer: Self-pay | Admitting: Physical Therapy

## 2014-07-17 ENCOUNTER — Encounter (HOSPITAL_COMMUNITY): Payer: Self-pay | Admitting: Occupational Therapy

## 2014-07-17 DIAGNOSIS — G9519 Other vascular myelopathies: Secondary | ICD-10-CM

## 2014-07-17 DIAGNOSIS — G9782 Other postprocedural complications and disorders of nervous system: Secondary | ICD-10-CM

## 2014-07-17 DIAGNOSIS — I10 Essential (primary) hypertension: Secondary | ICD-10-CM

## 2014-07-17 DIAGNOSIS — F1410 Cocaine abuse, uncomplicated: Secondary | ICD-10-CM

## 2014-07-17 NOTE — Progress Notes (Signed)
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 Denies any SOB. She states she went to the therapy and took a shower.  CURRENT MEDS . atorvastatin  20 mg Oral q1800  . bisacodyl  10 mg Rectal Q0600  . famotidine  20 mg Oral BID  . feeding supplement (ENSURE COMPLETE)  237 mL Oral TID BM  . gabapentin  100 mg Oral TID  . hydrALAZINE  100 mg Oral 3 times per day  . hydrochlorothiazide  12.5 mg Oral Daily  . labetalol  300 mg Oral BID  . nicotine  14 mg Transdermal Daily  . polyethylene glycol  17 g Oral QPC supper  . sertraline  50 mg Oral Daily    OBJECTIVE  Filed Vitals:   07/16/14 2004 07/16/14 2110 07/17/14 0534 07/17/14 0701  BP: 130/60 137/66 133/53 140/62  Pulse: 72 70 67   Temp:  98.8 F (37.1 C) 98.7 F (37.1 C)   TempSrc:  Oral Oral   Resp:  18 18   Height:      Weight:   145 lb (65.772 kg)   SpO2:  91% 96%     Intake/Output Summary (Last 24 hours) at 07/17/14 1223 Last data filed at 07/17/14 0830  Gross per 24 hour  Intake    140 ml  Output      0 ml  Net    140 ml   Filed Weights   07/08/14 1700 07/10/14 1014 07/17/14 0534  Weight: 149 lb 9.6 oz (67.858 kg) 153 lb 6.4 oz (69.582 kg) 145 lb (65.772 kg)    PHYSICAL EXAM  General: Pleasant, NAD. Neuro: Alert and oriented X 3. Moves all extremities spontaneously. Psych: Normal affect. HEENT:  Normal  Neck: Supple without bruits or JVD. Lungs:  Resp regular and unlabored, CTA. Heart: RRR no s3, s4, or murmurs. Abdomen: Soft, non-tender, non-distended, BS + x 4.  Extremities: No clubbing, cyanosis or edema.  Accessory Clinical Findings  Liver Function Tests  Recent Labs  07/15/14 0656  AST 71*   ALT 123*  ALKPHOS 165*  BILITOT 0.5  PROT 6.4  ALBUMIN 2.3*     ECG  NSR with HR 70s, TWI in lateral leads new when compared to previous EKG on 8/20  Echocardiogram  06/30/2014 LV EF: 60% - 65%  ------------------------------------------------------------------- Indications: Aortic Dissection Distal To the Left Subclavion (441).  ------------------------------------------------------------------- History: Risk factors: Current tobacco use. Hypertension.  ------------------------------------------------------------------- Study Conclusions  - Left ventricle: The cavity size was normal. There was moderate concentric hypertrophy. Systolic function was normal. The estimated ejection fraction was in the range of 60% to 65%. Wall motion was normal; there were no regional wall motion abnormalities. Doppler parameters are consistent with abnormal left ventricular relaxation (grade 1 diastolic dysfunction). The E/e&' ratio is <8, suggesting normal LV filling pressure. - Aorta: No clear dissection flap visualized in this study. The aortic arch size is generous, the descending aorta is well visualized and no evidence for dissection is noted. - Left atrium: The atrium was normal in size. - Tricuspid valve: There was mild regurgitation. - Pulmonary arteries: PA peak pressure: 26 mm Hg (S). - Pericardium, extracardiac: There was no pericardial effusion.  Impressions:  - LVEF 60-65%, moderate concentric LVH, normal LA size, generous aortic arch without clear visualization of a dissection flap, normal descending aorta, diastolic dysfunction, normal LV fililng pressure, no pericardial effusion.       Radiology/Studies  Dg Chest 1 View  07/05/2014   CLINICAL DATA:  Post LEFT thoracentesis  EXAM: CHEST - 1 VIEW  COMPARISON:  07/04/2014  FINDINGS: Minimal enlargement of cardiac silhouette.  Mediastinal contours and pulmonary vascularity normal.  Elevation of LEFT diaphragm with  mild bibasilar atelectasis.  Decreased LEFT pleural effusion since preceding exam.  No pneumothorax.  Upper lungs clear.  Bones unremarkable.  IMPRESSION: No pneumothorax following LEFT thoracentesis.  Bibasilar atelectasis with decreased LEFT pleural effusion since previous exam.   Electronically Signed   By: Ulyses Southward M.D.   On: 07/05/2014 10:24   Dg Chest 2 View  07/14/2014   CLINICAL DATA:  Chest pain  EXAM: CHEST  2 VIEW  COMPARISON:  Portable chest x-ray of July 09, 2014  FINDINGS: The lungs are adequately inflated. The atelectatic change at the lung bases has cleared. The cardiopericardial silhouette and pulmonary vascularity are normal. There is stable tortuosity of the descending thoracic aorta. The bony thorax is unremarkable.  IMPRESSION: There has been interval improvement in both lungs with resolution of basilar atelectasis. There is no evidence of CHF.   Electronically Signed   By: David  [place]   On: 07/14/2014 12:27   Dg Abd 1 View  07/08/2014   CLINICAL DATA:  Constipation.  Spinal cord ischemia.  EXAM: ABDOMEN - 1 VIEW  COMPARISON:  CT of the abdomen 07/04/2014  FINDINGS: The bowel gas pattern is normal. No radio-opaque calculi or other significant radiographic abnormality are seen. There is moderate stool burden. Visualized osseous structures have a normal appearance.  IMPRESSION: Moderate stool burden.   Electronically Signed   By: Rosalie Gums M.D.   On: 07/08/2014 18:22   Ct Angio Chest W/cm &/or Wo Cm  06/29/2014   ADDENDUM REPORT: 06/29/2014 20:00  ADDENDUM: Case was reviewed with Dr. Tyrone Sage at the time of addendum.  A 4 mm penetrating atherosclerotic ulcer is possible along the posterior aspect of the descending thoracic aorta (series 701/image 41). Although very subtle, a slight hyperdense crescent may be present on unenhanced imaging, reflecting intramural hematoma.  As such, while the findings remain compatible with acute aortic syndrome, this may reflect sequela of  penetrating atherosclerotic ulcer rather than true aortic dissection.   Electronically Signed   By: Charline Bills M.D.   On: 06/29/2014 20:00   06/29/2014   CLINICAL DATA:  Chest/back pain, weakness, nausea. History of renal disorder.  EXAM: CT ANGIOGRAPHY CHEST AND ABDOMEN  TECHNIQUE: Multidetector CT imaging of the chest and abdomen was performed using the standard protocol during bolus administration of intravenous contrast. Multiplanar CT image reconstructions and MIPs were obtained to evaluate the vascular anatomy.  CONTRAST:  OMNIPAQUE IOHEXOL 350 MG/ML SOLN  COMPARISON:  Chest radiograph dated 06/29/2014. Unenhanced CT abdomen pelvis dated 04/20/2009.  FINDINGS: CTA CHEST FINDINGS  No evidence of intramural hematoma on unenhanced CT.  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  Although not tailored for evaluation of the pulmonary arteries, there is no evidence of pulmonary embolism.  Mild dependent atelectasis at the lung bases. No suspicious pulmonary nodules. No pleural effusion or pneumothorax.  Visualized thyroid is unremarkable.  Heart is normal in size.  No pericardial effusion.  No suspicious  mediastinal, hilar, or axillary lymphadenopathy.  Visualized osseous structures are within normal limits.  Review of the MIP images confirms the above findings.  CTA ABDOMEN FINDINGS  Aortic dissection extends to the aortic bifurcation.  Celiac artery, SMA, IMA, and bilateral renal arteries all arise from the true lumen and remain patent.  Scattered hepatic cysts measuring up to 2.1 cm. Liver is otherwise within normal limits.  Spleen, pancreas, and adrenal glands are within normal limits.  Gallbladder is unremarkable. No intrahepatic or extrahepatic ductal dilatation.  Numerous bilateral renal cysts, compatible with polycystic renal disease. No hydronephrosis.  Visualized bowel is unremarkable.  No abdominal ascites.  No suspicious abdominal  lymphadenopathy.  Visualized osseous structures are within normal limits.  Review of the MIP images confirms the above findings.  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  Celiac artery, SMA, IMA, and bilateral renal arteries all arise from the true lumen and remain patent.  No evidence of intramural hematoma.  Additional ancillary findings as above.  These results were called by telephone at the time of interpretation on 06/29/2014 at 6:20 pm to Dr. Azalia Bilis , who verbally acknowledged these results.  Electronically Signed: By: Charline Bills M.D. On: 06/29/2014 18:34   Ct Angio Abdomen W/cm &/or Wo Contrast  06/29/2014   ADDENDUM REPORT: 06/29/2014 20:00  ADDENDUM: Case was reviewed with Dr. Tyrone Sage at the time of addendum.  A 4 mm penetrating atherosclerotic ulcer is possible along the posterior aspect of the descending thoracic aorta (series 701/image 41). Although very subtle, a slight hyperdense crescent may be present on unenhanced imaging, reflecting intramural hematoma.  As such, while the findings remain compatible with acute aortic syndrome, this may reflect sequela of penetrating atherosclerotic ulcer rather than true aortic dissection.   Electronically Signed   By: Charline Bills M.D.   On: 06/29/2014 20:00   06/29/2014   CLINICAL DATA:  Chest/back pain, weakness, nausea. History of renal disorder.  EXAM: CT ANGIOGRAPHY CHEST AND ABDOMEN  TECHNIQUE: Multidetector CT imaging of the chest and abdomen was performed using the standard protocol during bolus administration of intravenous contrast. Multiplanar CT image reconstructions and MIPs were obtained to evaluate the vascular anatomy.  CONTRAST:  OMNIPAQUE IOHEXOL 350 MG/ML SOLN  COMPARISON:  Chest radiograph dated 06/29/2014. Unenhanced CT abdomen pelvis dated 04/20/2009.  FINDINGS: CTA CHEST FINDINGS  No evidence of intramural hematoma  on unenhanced CT.  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  Although not tailored for evaluation of the pulmonary arteries, there is no evidence of pulmonary embolism.  Mild dependent atelectasis at the lung bases. No suspicious pulmonary nodules. No pleural effusion or pneumothorax.  Visualized thyroid is unremarkable.  Heart is normal in size.  No pericardial effusion.  No suspicious mediastinal, hilar, or axillary lymphadenopathy.  Visualized osseous structures are within normal limits.  Review of the MIP images confirms the above findings.  CTA ABDOMEN FINDINGS  Aortic dissection extends to the aortic bifurcation.  Celiac artery, SMA, IMA, and bilateral renal arteries all arise from the true lumen and remain patent.  Scattered hepatic cysts measuring up to 2.1 cm. Liver is otherwise within normal limits.  Spleen, pancreas, and adrenal glands are within normal limits.  Gallbladder is unremarkable. No intrahepatic or extrahepatic ductal dilatation.  Numerous bilateral renal cysts, compatible with polycystic renal disease. No hydronephrosis.  Visualized bowel is unremarkable.  No abdominal ascites.  No suspicious abdominal lymphadenopathy.  Visualized osseous structures are within normal limits.  Review of the MIP images confirms the above findings.  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  Celiac artery, SMA, IMA, and bilateral renal arteries all arise from the true lumen and remain patent.  No evidence of intramural hematoma.  Additional ancillary findings as above.  These results were called by telephone at the time of interpretation on 06/29/2014 at 6:20 pm to Dr. Azalia Bilis , who verbally acknowledged these results.  Electronically Signed: By: Charline Bills M.D. On: 06/29/2014 18:34   Dg Chest Port 1 View  07/09/2014   CLINICAL DATA:  Chest pain and  shortness of Breath.  EXAM: PORTABLE CHEST - 1 VIEW  COMPARISON:  07/06/2014  FINDINGS: Mild basilar opacity, greater on the left, is consistent with atelectasis. No significant pleural fluid. No pneumothorax. No evidence of pulmonary edema or pneumonia.  Cardiac silhouette is normal in size. No mediastinal or hilar masses.  IMPRESSION: Mild stable basilar atelectasis, greater on the left. No evidence of pneumonia or edema. No pneumothorax. No change from the prior study.   Electronically Signed   By: Amie Portland M.D.   On: 07/09/2014 08:12   Dg Chest Port 1 View  07/06/2014   CLINICAL DATA:  Left-sided chest pain, recent thoracentesis  EXAM: PORTABLE CHEST - 1 VIEW  COMPARISON:  Chest radiograph 8 14 2015  FINDINGS: No evidence of left-sided pneumothorax. There is interval decrease in pleural fluid on the left. Decrease in atelectasis. Right lung is clear.  IMPRESSION: No complication following left thoracentesis. No pneumothorax. Improved aeration left lung base.   Electronically Signed   By: Genevive Bi M.D.   On: 07/06/2014 19:14   Dg Chest Port 1 View  07/04/2014   CLINICAL DATA:  Reassess atelectasis  EXAM: PORTABLE CHEST - 1 VIEW  COMPARISON:  . Chest CT scan of June 29, 2014 portable chest x-ray of the same date  FINDINGS: The lungs are less well inflated today. The interstitial markings are mildly increased especially on the left. The retrocardiac region is dense consistent with atelectasis or pneumonia. The left hemidiaphragm is now obscured. The cardiac silhouette is mildly enlarged. The aortic knob is indistinct consistent with the known type B aortic dissection. The pulmonary vascularity is mildly engorged.  IMPRESSION: 1. There is left lower lobe atelectasis and/or pneumonia. 2. Increased pulmonary interstitial markings and mild cardiomegaly are consistent with CHF. 3. Indistinct aortic knob is consistent with the known aortic dissection.   Electronically Signed   By: David  [place]   On:  07/04/2014 08:05   Dg Chest Port 1 View  06/29/2014   CLINICAL DATA:  Chest pain  EXAM: PORTABLE CHEST - 1 VIEW  COMPARISON:  04/13/2010  FINDINGS: Lungs are clear.  No pleural effusion or pneumothorax.  The heart is normal in size.  IMPRESSION: No evidence of acute cardiopulmonary disease.   Electronically Signed   By: Charline Bills M.D.   On: 06/29/2014 17:16   Ct Angio Chest Aortic Dissect W &/or W/o  07/04/2014   CLINICAL DATA:  Type 3 aortic dissection.  Worsening chest pain.  EXAM: CT ANGIOGRAPHY CHEST, ABDOMEN AND PELVIS  TECHNIQUE: Multidetector CT imaging through the chest, abdomen and pelvis was performed using the standard protocol during bolus administration of intravenous contrast. Multiplanar reconstructed images and MIPs were obtained and reviewed  to evaluate the vascular anatomy.  CONTRAST:  OMNIPAQUE IOHEXOL 350 MG/ML SOLN  COMPARISON:  06/29/2014.  FINDINGS: CTA CHEST FINDINGS  Small intramural hematoma is identified on precontrast imaging. This was present on the prior exam although it is better visualized due to technique on today's study. The intramural hematoma extends from the aortic arch to the upper abdomen.  Intramural hematoma appears slightly decreased in thickness compared to the prior exam 06/29/2014. The intramural hematoma extends from the arch into the upper abdomen. There is no visible dissection flap. The small area of proximal descending thoracic aortic contrast suspicious for penetrating atherosclerotic ulcer is no longer visible on today's study.  In the interval since the prior exam, moderate to large LEFT and small RIGHT pleural effusions have developed with atelectasis. The pleural effusions are low-attenuation. There is no hemopericardium or hemothorax in this patient with acute aortic syndrome. Heart appears mildly enlarged and increased in size compared to prior, compatible with volume overload. The lungs show compressive atelectasis but no areas of  consolidation. Coronary artery atherosclerosis is present. If office based assessment of coronary risk factors has not been performed, it is now recommended.  Review of the MIP images confirms the above findings.  CTA ABDOMEN AND PELVIS FINDINGS  Abdominal aortic atherosclerosis without aneurysm. No dissection flap is identified in the abdominal aorta. Bilateral iliofemoral mild atherosclerosis. No occlusion. Celiac axis, superior mesenteric artery and inferior mesenteric artery are patent.  Polycystic kidney disease is present with associated liver cysts. There is a large calculus in the LEFT inferior renal pole measuring 13 mm. This may be contained within a cyst or an a partially obstructed collecting system or calyceal diverticulum. Bowel appears within normal limits. Liver and spleen normal. Small amount of ascites, probably secondary to volume overload. Mild anasarca changes are present in the subcutaneous tissues.  Review of the MIP images confirms the above findings.  IMPRESSION: 1. Evolving descending thoracic aortic intramural hematoma. Mildly decreased intramural hematoma evident on noncontrast imaging. Penetrating atherosclerotic ulcer is no longer visualized. No dissection flap. 2. Development of moderate to large LEFT pleural effusion and small RIGHT pleural effusion. Both effusions layer dependently and produce substantial compressive atelectasis. 3. Polycystic kidney disease with associated liver cysts.   Electronically Signed   By: Andreas Newport M.D.   On: 07/04/2014 16:08   Ct Angio Abd/pel W/ And/or W/o  07/04/2014   CLINICAL DATA:  Type 3 aortic dissection.  Worsening chest pain.  EXAM: CT ANGIOGRAPHY CHEST, ABDOMEN AND PELVIS  TECHNIQUE: Multidetector CT imaging through the chest, abdomen and pelvis was performed using the standard protocol during bolus administration of intravenous contrast. Multiplanar reconstructed images and MIPs were obtained and reviewed to evaluate the vascular  anatomy.  CONTRAST:  OMNIPAQUE IOHEXOL 350 MG/ML SOLN  COMPARISON:  06/29/2014.  FINDINGS: CTA CHEST FINDINGS  Small intramural hematoma is identified on precontrast imaging. This was present on the prior exam although it is better visualized due to technique on today's study. The intramural hematoma extends from the aortic arch to the upper abdomen.  Intramural hematoma appears slightly decreased in thickness compared to the prior exam 06/29/2014. The intramural hematoma extends from the arch into the upper abdomen. There is no visible dissection flap. The small area of proximal descending thoracic aortic contrast suspicious for penetrating atherosclerotic ulcer is no longer visible on today's study.  In the interval since the prior exam, moderate to large LEFT and small RIGHT pleural effusions have developed with atelectasis. The  pleural effusions are low-attenuation. There is no hemopericardium or hemothorax in this patient with acute aortic syndrome. Heart appears mildly enlarged and increased in size compared to prior, compatible with volume overload. The lungs show compressive atelectasis but no areas of consolidation. Coronary artery atherosclerosis is present. If office based assessment of coronary risk factors has not been performed, it is now recommended.  Review of the MIP images confirms the above findings.  CTA ABDOMEN AND PELVIS FINDINGS  Abdominal aortic atherosclerosis without aneurysm. No dissection flap is identified in the abdominal aorta. Bilateral iliofemoral mild atherosclerosis. No occlusion. Celiac axis, superior mesenteric artery and inferior mesenteric artery are patent.  Polycystic kidney disease is present with associated liver cysts. There is a large calculus in the LEFT inferior renal pole measuring 13 mm. This may be contained within a cyst or an a partially obstructed collecting system or calyceal diverticulum. Bowel appears within normal limits. Liver and spleen normal. Small  amount of ascites, probably secondary to volume overload. Mild anasarca changes are present in the subcutaneous tissues.  Review of the MIP images confirms the above findings.  IMPRESSION: 1. Evolving descending thoracic aortic intramural hematoma. Mildly decreased intramural hematoma evident on noncontrast imaging. Penetrating atherosclerotic ulcer is no longer visualized. No dissection flap. 2. Development of moderate to large LEFT pleural effusion and small RIGHT pleural effusion. Both effusions layer dependently and produce substantial compressive atelectasis. 3. Polycystic kidney disease with associated liver cysts.   Electronically Signed   By: Andreas Newport M.D.   On: 07/04/2014 16:08   US Thoracentesis Asp Pleural Space W/img Guide  07/05/2014   CLINICAL DATA:  Left pleural effusion  EXAM: ULTRASOUND GUIDED left THORACENTESIS  COMPARISON:  None.  PROCEDURE: An ultrasound guided thoracentesis was thoroughly discussed with the patient and questions answered. The benefits, risks, alternatives and complications were also discussed. The patient understands and wishes to proceed with the procedure. Written consent was obtained.  Ultrasound was performed to localize and mark an adequate pocket of fluid in the left chest. The area was then prepped and draped in the normal sterile fashion. 1% Lidocaine was used for local anesthesia. Under ultrasound guidance a 19 gauge Yueh catheter was introduced. Thoracentesis was performed. The catheter was removed and a dressing applied.  Complications:  None.  FINDINGS: A total of approximately 300 cc of blood tinged fluid was removed. A fluid sample wassent for laboratory analysis.  IMPRESSION: Successful ultrasound guided left thoracentesis yielding 300 cc of pleural fluid.  Read by:  Robet Leu [facility]   Electronically Signed   By: Irish Lack M.D.   On: 07/05/2014 11:51    ASSESSMENT AND PLAN  1. 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  - bilateral LE pulse 2+ exam reassuring  - continue hctz, hydralazine and labetalol   2. hypertensive heart disease   3. polycystic kidney disease with chronic kidney disease stage III   4. prior tobacco abuse: educated on importance of tobacco cessation   5. hyperlipidemia with LDL cholesterol 129.   6. Recent chest discomfort negative trop - unlikely to be ACS, plus pt had recent dissection and inability to start any dual antiplatelet med anytime soon. - TWI more obvious now in the lateral leads than before 8/20.  Can consider outpt nuc once recovered.   Ramond Dial PA-C Pager: [phone]  History and all data above reviewed.  Patient examined.  I agree with the findings as  above.  The patient exam reveals COR:RRR  ,  Lungs: Clear  ,  Abd: Positive bowel sounds, no rebound no guarding, Ext No edema  .  All available labs, radiology testing, previous records reviewed. Agree with documented assessment and plan. Chest pain is improved.  No further work up.  HTN:  BP improved.  Continue current therapy.    Fayrene Fearing Jakin Pavao  2:18 PM  07/17/2014

## 2014-07-17 NOTE — Progress Notes (Signed)
Patient states that she has gas pain and is having some relief when she passes gas. Sat patient upright provided cola soda. Will continue to monitor.  Diamantina Monks, RN

## 2014-07-17 NOTE — Progress Notes (Signed)
Occupational Therapy Session Note  Patient Details  Name: Charlotte [name] MRN: [id number] Date of Birth: 1966/08/13  Today's Date: 07/17/2014 OT Individual Time: 0930-1030 OT Individual Time Calculation (min): 60 min  Short Term Goals: Week 1:  OT Short Term Goal 1 (Week 1): Pt will perform upper body bathing and dressing with setup assist OT Short Term Goal 1 - Progress (Week 1): Met OT Short Term Goal 2 (Week 1): Pt will complete lower body bathing supine using AE with mod assist OT Short Term Goal 2 - Progress (Week 1): Met OT Short Term Goal 3 (Week 1): Pt will complete transfer on/off tub bench with mod assist OT Short Term Goal 3 - Progress (Week 1): Met OT Short Term Goal 4 (Week 1): Pt will complete lower body dressing with min assist OT Short Term Goal 4 - Progress (Week 1): Progressing toward goal OT Short Term Goal 5 (Week 1): Pt will complete toileting with min assist OT Short Term Goal 5 - Progress (Week 1): Met  Week 2:  OT Short Term Goal 1 (Week 2): Patient will complete lower body dressing with min assist using AE prn OT Short Term Goal 2 (Week 2): Patient will complete transfer on/off tub bench in standard tub with min assist to manage BLE OT Short Term Goal 3 (Week 2): Patient will demo ability to complete HEP for BUE strengthening and endurance with supervision OT Short Term Goal 4 (Week 2): Pt will demo ability to retrieve beverage and/or food items as needed for simple meal prep at w/c level  Skilled Therapeutic Interventions/Progress Updates:  Patient received seated in w/c with complaints of being cold, patient willing to work with therapist. Therapist propelled patient into bathroom and patient transferred w/c > padded tub transfer bench with minimal assistance (squat pivot technique). Patient then completed UB/LB bathing in seated position with close supervision. Patient dried, donned bra & tshirt, then therapist assisted with drying BLEs and donned patient's  bilateral socks. During LB bathing, drying, and donning of socks, therapist attempted to have patient cross legs but patient unable due to increased stiffness. Patient transferred bench>w/c with min assist, then sat at sink for grooming task of brushing teeth. Patient with minimal complaints of nausea, therapist discussed this with RN and gave patient ginger ale and saltine crackers. From here, patient transferred w/c> EOB>supine; therapist incorporating leg lifter to increase patient's independence with bed mobility. Patient laid in supine and therapist donned bilateral thigh high TEDs. Therapist then had patient work on Passenger transport manager. Initially, patient stated "my daughter helps me do this". Once patient saw that she was able to complete LB dressing with reacher, she smiled and said "I need to work on doing this stuff myself". Therapist worked on stretching patient's BLEs in order to increase independence with LB ADLs and at end of session, left patient supine in bed with all needs within reach. Patient's husband present during entire session.   Precautions:  Precautions Precautions: Fall Restrictions Weight Bearing Restrictions: No  See FIM for current functional status  Therapy/Group: Individual Therapy  Dakai Braithwaite 07/17/2014, 10:46 AM

## 2014-07-17 NOTE — Progress Notes (Signed)
[facility]     PROGRESS NOTE    Subjective/Complaints: Took suppository this am. Pain sometimes limiting with therapy A  review of systems has been performed and if not noted above is otherwise negative.    Objective: Vital Signs: Blood pressure 140/62, pulse 67, temperature 98.7 F (37.1 C), temperature source Oral, resp. rate 18, height  (1.651 m), weight 65.772 kg (145 lb), SpO2 96.00%. No results found. No results found for this basename: WBC, HGB, HCT, PLT,  in the last 72 hours No results found for this basename: NA, K, CL, CO, GLUCOSE, BUN, CREATININE, CALCIUM,  in the last 72 hours CBG (last 3)  No results found for this basename: GLUCAP,  in the last 72 hours  Wt Readings from Last 3 Encounters:  07/17/14 65.772 kg (145 lb)  07/07/14 66.588 kg (146 lb 12.8 oz)  02/09/11 59.648 kg (131 lb 8 oz)    Physical Exam:  Constitutional: [name], place, and time. She appears well-developed and well-nourished.  HENT: oral mucosa moist Head: Normocephalic and atraumatic.  Eyes: Conjunctivae are normal. Pupils are equal, round, and reactive to light.  Neck: Normal range of motion. Neck supple.  Cardiovascular: Regular rhythm and rate  Respiratory: Effort normal and breath sounds normal. No respiratory distress. She has no wheezes. No rales GI: Soft. Bowel sounds are normal. She exhibits no distension. There is no tenderness.  Musculoskeletal: She exhibits no edema. Tenderness to palpation over sternum and xyphoid Neurological: She is alert and oriented to person, place, and time.  LLE>RLE weakness.  3- R to 2 /5  L with HF/KE,  2+/5 PF/DF.  Upper extremities strength5/5 deltoid, bicep, tricep, grip   Skin: Skin is warm and dry.  Psychiatric: more pleasant and up beat.   Assessment/Plan: 1. Functional deficits secondary to thoracic spinal cord infarct causing paraplegia which requires 3+ hours per day of  interdisciplinary therapy in a comprehensive inpatient rehab setting. Physiatrist is providing close team supervision and 24 hour management of active medical problems listed below. Physiatrist and rehab team continue to assess barriers to discharge/monitor patient progress toward functional and medical goals.  Had a long talk today with patient about her participation and will to get better. We reviewed her pain tolerance, effort, diet, etc----challenged her to work harder and push through these issues.   FIM: FIM - Bathing Bathing Steps Patient Completed: Chest;Right Arm;Left Arm;Abdomen Bathing: 3: Mod-Patient completes 5-7 70f 10 parts or 50-74%  FIM - Upper Body Dressing/Undressing Upper body dressing/undressing steps patient completed: Thread/unthread right sleeve of front closure shirt/dress;Thread/unthread left sleeve of front closure shirt/dress;Pull shirt around back of front closure shirt/dress Upper body dressing/undressing: 5: Set-up assist to: Obtain clothing/put away FIM - Lower Body Dressing/Undressing Lower body dressing/undressing steps patient completed: Fasten/unfasten pants Lower body dressing/undressing: 1: Total-Patient completed less than 25% of tasks  FIM - Hotel manager Devices: Grab bar or rail for support Toileting: 1: Total-Patient completed zero steps, helper did all 3  FIM - Diplomatic Services operational officer Devices: Psychiatrist Transfers: 0-Activity did not occur  FIM - Banker Devices: Arm rests;HOB elevated;Bed rails Bed/Chair Transfer: 4: Supine > Sit: Min A (steadying Pt. > 75%/lift 1 leg);4: Sit > Supine: Min A (steadying pt. > 75%/lift 1 leg);4: Bed > Chair or W/C: Min A (steadying Pt. > 75%);4: Chair or W/C > Bed: Min A (steadying Pt. > 75%)  FIM - Locomotion: Wheelchair  Distance: 150 Locomotion: Wheelchair: 5: Travels 150 ft or more: maneuvers on rugs and over door  sills with supervision, cueing or coaxing FIM - Locomotion: Ambulation Locomotion: Ambulation: 0: Activity did not occur  Comprehension Comprehension Mode: Auditory Comprehension: 5-Follows basic conversation/direction: With extra time/assistive device  Expression Expression Mode: Verbal Expression: 5-Expresses basic 90% of the time/requires cueing < 10% of the time.  Social Interaction Social Interaction: 4-Interacts appropriately 75 - 89% of the time - Needs redirection for appropriate language or to initiate interaction.  Problem Solving Problem Solving: 4-Solves basic 75 - 89% of the time/requires cueing 10 - 24% of the time  Memory Memory: 4-Recognizes or recalls 75 - 89% of the time/requires cueing 10 - 24% of the time  Medical Problem List and Plan:  1. 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 DVT Prophylaxis/Anticoagulation: Mechanical: Antiembolism stockings, knee (TED hose) Bilateral lower extremities  Sequential compression devices, below knee Bilateral lower extremitie   -dopplers neg 3. Pain Management: Will increase oxycodone to 15 mg and discontinue prn dilaudid.  4. Mood: LCSW to follow for evaluation and support.  5. Neuropsych: This patient is capable of making decisions on her own behalf.   -xanax prn for anxiety.  -zoloft    -+ reinforcement by team---see above 6. Skin/Wound Care: Turn patient every 2 hours. Pressure relief measures. Maintain adequate hydration and nutritional status.  7. Hyponatremia: 134  8. Acute renal failure: push po fluids. Monitor for orthostasis. Labs holding steady 9. Constipation: +BM recently 10. CV/HTN--EKG normal  -working on BP mgt  --has hx of chest pain due to hematoma,effusions prior to transfer to rehab, likely musculoskeletal component also.   -rx pain symptomatically as well  -appreciate cards  assist  LOS (Days) 9 A FACE TO FACE EVALUATION WAS PERFORMED  Osie Merkin T 07/17/2014 8:16 AM

## 2014-07-17 NOTE — Progress Notes (Signed)
Physical Therapy Session Note  Patient Details  Name: Charlotte [name] MRN: [id number] Date of Birth: September 03, 1966  Today's Date: 07/17/2014 PT Individual Time: 0830-0900 PT Individual Time Calculation (min): 30 min   Short Term Goals: Week 2:  PT Short Term Goal 1 (Week 2): Pt will tolerate 60 minutes of therapeutic activity PT Short Term Goal 2 (Week 2): Pt will consistently transfer bed<>w/c w/ MinA PT Short Term Goal 3 (Week 2): Pt will perform all bed mobility w/ S w/ use of AE PT Short Term Goal 4 (Week 2): Pt will tolerate and participate in 180 minutes of therapy per day  Skilled Therapeutic Interventions/Progress Updates:    Pt on bedpan upon therapist arrival, requesting time to finishing moving bowels. Pt refused assistance from female therapist, requesting female staff member. Pt assisted w/ toileting, clean up, and changing brief by female nurse tech. Pt missed 30 minutes of scheduled PT time. Session focused on functional mobility, w/c propulsion, endurance, BLE coordination/motor return. Pt moved supine>sit w/ CGA for managing RLE (pt used leg lifter for LLE). Lateral scoot transfer to R bed>w/c w/ use of bed rails and arm rests w/ MinA for safety. Pt propelled w/c 120' to rehab gym w/ A for managing partsand turning tight corners. Pt transferred w/c <> Nustep machine w/ Min-ModA for managing BLE and mod VC's for hand placement and sequencing. For BLE strength, coordination, and reciprocal motor pattern Pt completed 4'x2 on Nustep L1, initially w/ LE only (and MinA from therapist), then w/ UE/LE. Noted pt had difficulty maintaining neutral rotation in R hip during activity and R foot in supination. Pt would benefit form stretching BLE prior to therapy. Pt propelled w/c 120' back to room, agreed to remain up in chair for OT session in 30 minutes. Pt left seated in w/c w/ all needs within reach.    Therapy Documentation Precautions:  Precautions Precautions: Fall Restrictions Weight  Bearing Restrictions: No Pain: Pain Assessment Pain Assessment: No/denies pain Pain Score: 0-No pain  See FIM for current functional status  Therapy/Group: Individual Therapy  Hosie Spangle Hosie Spangle, PT, DPT 07/17/2014, 12:09 PM

## 2014-07-17 NOTE — Progress Notes (Signed)
Occupational Therapy Session Note  Patient Details  Name: Charlotte [name] MRN: [id number] Date of Birth: [birth year]  Today's Date: 07/17/2014 OT Individual Time: 1300-1330 OT Individual Time Calculation (min): 30 min  Make up session    Short Term Goals: Week 1:  OT Short Term Goal 1 (Week 1): Pt will perform upper body bathing and dressing with setup assist OT Short Term Goal 1 - Progress (Week 1): Met OT Short Term Goal 2 (Week 1): Pt will complete lower body bathing supine using AE with mod assist OT Short Term Goal 2 - Progress (Week 1): Met OT Short Term Goal 3 (Week 1): Pt will complete transfer on/off tub bench with mod assist OT Short Term Goal 3 - Progress (Week 1): Met OT Short Term Goal 4 (Week 1): Pt will complete lower body dressing with min assist OT Short Term Goal 4 - Progress (Week 1): Progressing toward goal OT Short Term Goal 5 (Week 1): Pt will complete toileting with min assist OT Short Term Goal 5 - Progress (Week 1): Met  Skilled Therapeutic Interventions/Progress Updates:    Make up session: Pt seen for 1:1 OT session with focus on activity tolerance and functional transfers. Pt received supine in bed requiring min cues for participation d/t nausea. Required min assist supine>sit then completed squat pivot transfers bed<>w/c and w/c<>TTB in ADL apartment with min assist. Pt required min cues for safety during tub transfer as she attempted to transfer to w/c with RLE remaining in tub. Pt propelled self short distances in w/c 20-30 feet 2x to increase activity tolerance. Pt returned to room and left supine in bed with all needs in reach.    Therapy Documentation Precautions:  Precautions Precautions: Fall Restrictions Weight Bearing Restrictions: No General:   Vital Signs: Therapy Vitals Temp: 98.7 F (37.1 C) Temp src: Oral Pulse Rate: 67 Resp: 18 BP: 140/62 mmHg Patient Position (if appropriate): Lying Oxygen Therapy SpO2: 96 % O2 Device: None  (Room air) Pain: Pain Assessment Pain Assessment: No/denies pain Pain Score: 0-No pain  See FIM for current functional status  Therapy/Group: Individual Therapy  Duayne Cal 07/17/2014, 7:19 AM

## 2014-07-17 NOTE — Progress Notes (Signed)
Physical Therapy Note  Patient Details  Name: CYAN CLIPPINGER MRN: [id number] Date of Birth: 11-12-1966 Today's Date: 07/17/2014    At scheduled therapy time pt in room w/ NT and PT present having bout of nausea/vomiting. Pt requested time to rest, refused therapy. Pt missed 30 minutes of scheduled PT time.   Hosie Spangle 07/17/2014, 4:33 PM

## 2014-07-17 NOTE — Progress Notes (Signed)
Physical Therapy Note  Patient Details  Name: Charlotte [name] MRN: [id number] Date of Birth: 1966-01-31 Today's Date: 07/17/2014    After discussion with rehab team pt changed to 15 hr/7 day schedule due to decreased activity tolerance and increased anxiety surrounding movement limiting participation and ability to complete 3 hrs of therapy.  Hosie Spangle 07/17/2014, 4:49 PM

## 2014-07-17 NOTE — Progress Notes (Signed)
Physical Therapy Session Note  Patient Details  Name: Charlotte [name] MRN: [id number] Date of Birth: 05/30/1966  Today's Date: 07/17/2014 PT Individual Time: 4401-0272 and 5366-4403 PT Individual Time Calculation (min): 21 min and 15 min   Short Term Goals: Week 2:  PT Short Term Goal 1 (Week 2): Pt will tolerate 60 minutes of therapeutic activity PT Short Term Goal 2 (Week 2): Pt will consistently transfer bed<>w/c w/ MinA PT Short Term Goal 3 (Week 2): Pt will perform all bed mobility w/ S w/ use of AE PT Short Term Goal 4 (Week 2): Pt will tolerate and participate in 180 minutes of therapy per day  Skilled Therapeutic Interventions/Progress Updates:    Treatment Session 1: Pt received semi reclined in bed. Pt initially refused to participate in sessoin secondary to pt report of feeling ill, which pt attributes to "wearing a smoking patch," per pt. Per RN (present), pt did not eat breakfast or lunch today and ate very little when taking medications. With max coaxing of therapist and RN, pt agreed to transfer into w/c to eat cereal with medications.  Session therefore focused on increasing pt independence with bed mobility and functional transfers. Pt performed supine>sit with mod A for bilat LE management with bed rail and HOB elevated 10 degrees, max multimodal cueing for sequencing, technique. After seated rest break, pt performed squat pivot transfer from bed>w/c with min guard. Session ended at this time secondary to pt refusal to continue therapy session. Pt left seated in w/c in no apparent distress with RN and family present.  Treatment Session 2: Pt received semi reclined in bed; reports feeling somewhat better but continues to require max coaxing to participate in therapy. Therapist explained, demonstrated use of leg loops to promote independent LE management during mobility. Pt then performed supine>sit with min A using leg loops and bed rail with HOB elevated 30 degrees; tactile/verbal  cueing for hand placement and use of leg loops. Upon sitting EOB, pt immediately began vomiting. RN notified and present soon thereafter. Performed sit>supine with Total A and scooting to [facility] with +2A secondary to nausea. Therapist departed with pt semi reclined in bed with RN present and all needs within reach.  Therapy Documentation Precautions:  Precautions Precautions: Fall Restrictions Weight Bearing Restrictions: No Vital Signs: Therapy Vitals Temp: 98.7 F (37.1 C) Temp src: Oral Pulse Rate: 70 Resp: 18 BP: 135/67 mmHg Patient Position (if appropriate): Lying Pain: Pain Assessment Pain Assessment: No/denies pain  See FIM for current functional status  Therapy/Group: Individual Therapy  Nancyjo Givhan, Lorenda Ishihara 07/17/2014, 4:29 PM

## 2014-07-18 ENCOUNTER — Encounter (HOSPITAL_COMMUNITY): Payer: Self-pay | Admitting: Occupational Therapy

## 2014-07-18 ENCOUNTER — Inpatient Hospital Stay (HOSPITAL_COMMUNITY): Payer: Medicaid Other | Admitting: Physical Therapy

## 2014-07-18 ENCOUNTER — Inpatient Hospital Stay (HOSPITAL_COMMUNITY): Payer: Self-pay | Admitting: Rehabilitation

## 2014-07-18 ENCOUNTER — Inpatient Hospital Stay (HOSPITAL_COMMUNITY): Payer: Self-pay

## 2014-07-18 NOTE — Progress Notes (Signed)
Occupational Therapy Session Note  Patient Details  Name: Charlotte [name] MRN: [id number] Date of Birth: 1966-02-11  Today's Date: 07/18/2014 OT Individual Time: 1431-1446 OT Individual Time Calculation (min): 15 min    Short Term Goals: Week 1:  OT Short Term Goal 1 (Week 1): Pt will perform upper body bathing and dressing with setup assist OT Short Term Goal 1 - Progress (Week 1): Met OT Short Term Goal 2 (Week 1): Pt will complete lower body bathing supine using AE with mod assist OT Short Term Goal 2 - Progress (Week 1): Met OT Short Term Goal 3 (Week 1): Pt will complete transfer on/off tub bench with mod assist OT Short Term Goal 3 - Progress (Week 1): Met OT Short Term Goal 4 (Week 1): Pt will complete lower body dressing with min assist OT Short Term Goal 4 - Progress (Week 1): Progressing toward goal OT Short Term Goal 5 (Week 1): Pt will complete toileting with min assist OT Short Term Goal 5 - Progress (Week 1): Met  Skilled Therapeutic Interventions/Progress Updates:    Pt seen for 1:1 OT session with focus on bed mobility, core strengthening, and activity tolerance. Pt received supine in bed statin g"I hate therapy and I'm not doing it." With max cues of encouragement pt willing to participate from bed level d/t pain in BLE and headache. Engaged in rolling in bed L/R with BLE flexed and bridging to increase core strength. Pt required frequent rest breaks d/t fatigue. Pt missing 15 min of OT secondary to pain.   Therapy Documentation Precautions:  Precautions Precautions: Fall Restrictions Weight Bearing Restrictions: No General: General OT Amount of Missed Time: 15 Minutes Vital Signs: Therapy Vitals Temp: 98.5 F (36.9 C) Temp src: Oral Pulse Rate: 81 Resp: 16 BP: 135/73 mmHg Patient Position (if appropriate): Lying Oxygen Therapy SpO2: 98 % O2 Device: None (Room air) Pain: Pain Assessment Pain Assessment: 0-10 Pain Score: 8  Pain Type: Acute pain Pain  Location: Leg Pain Orientation: Right;Left Pain Descriptors / Indicators: Throbbing Pain Onset: With Activity Pain Intervention(s): Repositioned;Rest Multiple Pain Sites: No ADL: ADL ADL Comments: see FIM Exercises:   Other Treatments:    See FIM for current functional status  Therapy/Group: Individual Therapy  Duayne Cal 07/18/2014, 2:54 PM

## 2014-07-18 NOTE — Progress Notes (Signed)
Physical Therapy Session Note  Patient Details  Name: Charlotte [name] MRN: [id number] Date of Birth: 14-Jun-1966  Today's Date: 07/18/2014 PT Individual Time: 1100-1200 Tx 2: 1300-1400 PT Individual Time Calculation (min): 60 min Tx 2: 60 min  Short Term Goals: Week 2:  PT Short Term Goal 1 (Week 2): Pt will tolerate 60 minutes of therapeutic activity PT Short Term Goal 2 (Week 2): Pt will consistently transfer bed<>w/c w/ MinA PT Short Term Goal 3 (Week 2): Pt will perform all bed mobility w/ S w/ use of AE PT Short Term Goal 4 (Week 2): Pt will tolerate and participate in 180 minutes of therapy per day  Skilled Therapeutic Interventions/Progress Updates:    Therapeutic Exercise: PT instructs pt in strengthening and ROM exercises: ankle pumps x 20, heel cord stretch x 1 minute B ankles, supine hip abduction/adduction AAROM, hamstring stretch x 1 minute, heel slides AA/PROM on way up and AROM to min resisted on way down x 10 reps, hip adductors stretch x 1 minute, LAQ AAROM L LE and AROM R LE, seated march AA/PROM x 10- all done 2 sets  Therapeutic Activity: PT instructs pt in rolling L in bed req mod A and L side lie to sit req mod A without rail. Pt's static sit balance req SBA for safety and pt drinks some OJ from a cup while sitting edge of bed. PT instructs pt in scooting towards head of bed while sitting on edge req SBA-CGA, and sit to supine transfer req mod A for B LEs.   Pt may be developing neural tension in posterior LE nerves, due to pain reported with hamstring stretch, prior to PT feeling a soft end feel.   Session 2: Therapeutic Activity: PT instructs pt to roll L in bed req SBA with rail. PT hands pt leg lifter and pt demonstrates ability to get L LE out of bed with it, but gives up and req min A to get R LE out of bed with leg lifter. PT instructs pt in L side lie to sit req min A and verbal cues for hand placement. PT instructs pt in scoot transfer bed to w/c to R, req CGA  and verbal cues to lean forward. PT instructs pt in scoot transfer w/c to bed to L req SBA-CGA and verbal cues for foot placement and leaning trunk forward. PT gives pt leg lifter and instructs her in using it for sit to supine transfer, but pt continues to need assist with each leg (mod A). PT instructs pt in chair push-ups 2 x 10 reps for bottom pressure relief - pt does not achieve full clearance, but is able to relieve the pressure somewhat.   W/C Management: Pt is currently in a standard height (19") floor to seat height, 18" wide, and 18" deep manual w/c with desk length armrests and swing away legrests. Pt is d/cing to her daughter's home, which is older, and may benefit from a 16" wide manual w/c. Pt is unsure if she is agreeable to this and would benefit from trialing a 16" wide and 18" depth manual w/c while on the unit to ensure it is comfortable for her.   Community Reintegration: PT instructs pt in self propelling manual w/c on level surface x 150', req SBA on level surfaces and min A up/down slight incline outside front of hospital and verbal cues for UE use for turns. Pt practices self propelling in gift shop, tight spaces, req verbal cues to remember the  w/c is wider than she is, and occasional min A with tight turns. PT explains to pt how her 2 daughters will need to bump w/c up/down 2 steps to enter/exit their home, which is her d/c destination and she verbalizes understanding.   Pt continues to be fatigued and have low motivation to participate in therapy session. Pt attempts to refuse therapist before each session and requires max encouragement to participate in therapy. Pt will benefit from continued training with leg lifter for bed mobility, transfer training to improve safety, and generalized conditioning to improve activity tolerance. Family needs to be trained in assisting pt in transfers, as well as bumping w/c up/down 2 stairs to enter/exit home.   Therapy  Documentation Precautions:  Precautions Precautions: Fall Restrictions Weight Bearing Restrictions: No Vital Signs: Therapy Vitals Pulse Rate: 82 BP: 121/64 mmHg Patient Position (if appropriate): Lying Oxygen Therapy SpO2: 97 % O2 Device: None (Room air) Pain: Pain Assessment Pain Assessment: 0-10 Pain Score: 8  Pain Type: Chronic pain Pain Location: Back Pain Orientation: Lower Pain Descriptors / Indicators: Aching Pain Onset: On-going Pain Intervention(s): Rest;Repositioned Multiple Pain Sites: No Tx 2: Pt c/o 8/10 throbbing pain in B legs - PT utilizes rest and repositioning to decrease pain.  See FIM for current functional status  Therapy/Group: Individual Therapy  Parker Wherley M 07/18/2014, 11:09 AM

## 2014-07-18 NOTE — Progress Notes (Signed)
Occupational Therapy Session Note  Patient Details  Name: Charlotte [name] MRN: [id number] Date of Birth: 1966/09/03  Today's Date: 07/18/2014 OT Individual Time: 0800-0900 OT Individual Time Calculation (min): 60 min  Short Term Goals: Week 1:  OT Short Term Goal 1 (Week 1): Pt will perform upper body bathing and dressing with setup assist OT Short Term Goal 1 - Progress (Week 1): Met OT Short Term Goal 2 (Week 1): Pt will complete lower body bathing supine using AE with mod assist OT Short Term Goal 2 - Progress (Week 1): Met OT Short Term Goal 3 (Week 1): Pt will complete transfer on/off tub bench with mod assist OT Short Term Goal 3 - Progress (Week 1): Met OT Short Term Goal 4 (Week 1): Pt will complete lower body dressing with min assist OT Short Term Goal 4 - Progress (Week 1): Progressing toward goal OT Short Term Goal 5 (Week 1): Pt will complete toileting with min assist OT Short Term Goal 5 - Progress (Week 1): Met  Week 2:  OT Short Term Goal 1 (Week 2): Patient will complete lower body dressing with min assist using AE prn OT Short Term Goal 2 (Week 2): Patient will complete transfer on/off tub bench in standard tub with min assist to manage BLE OT Short Term Goal 3 (Week 2): Patient will demo ability to complete HEP for BUE strengthening and endurance with supervision OT Short Term Goal 4 (Week 2): Pt will demo ability to retrieve beverage and/or food items as needed for simple meal prep at w/c level  Skilled Therapeutic Interventions/Progress Updates:  Patient received supine in bed with no complaints of pain, but complaints of being cold. Patient required max encouragement in order to work with therapist this am. Patient engaged in bed mobility of rolling left<>right in order for therapist to assist with peri cleansing and changing of brief; patient with urine incontinence. Therapist donned bilateral TEDs, then patient worked on donning of pants using reacher to help increase  independence. Patient refused changing of shirt. With encouragement, patient engaged in bed mobility and sat EOB with close supervision; using leg lifter to increase independence. Patient transferred EOB>w/c with steady assist, then sat in w/c at sink for grooming tasks of washing face and brushing teeth. From here, patient worked on self propulsion > dayroom for breakfast; getting patient out of room may increase patient's mood and encourage her to work with therapies. During breakfast, therapist discussed d/c planning with patient regarding need of 24/7 supervision/assistance and use of DME (BSC & tub transfer bench). PT present at end of session for next therapy session.   Precautions:  Precautions Precautions: Fall Restrictions Weight Bearing Restrictions: No  See FIM for current functional status  Therapy/Group: Individual Therapy  Lasheena Frieze 07/18/2014, 10:07 AM

## 2014-07-18 NOTE — Progress Notes (Signed)
[place] PHYSICAL MEDICINE & REHABILITATION     PROGRESS NOTE    Subjective/Complaints: Had bm yesterday. Slept well last night. Therapy intensity decreased due to poor tolerance and anxiety  A  review of systems has been performed and if not noted above is otherwise negative.    Objective: Vital Signs: Blood pressure 145/79, pulse 82, temperature 99 F (37.2 C), temperature source Oral, resp. rate 17, height  (1.651 m), weight 65.772 kg (145 lb), SpO2 96.00%. No results found. No results found for this basename: WBC, HGB, HCT, PLT,  in the last 72 hours No results found for this basename: NA, K, CL, CO, GLUCOSE, BUN, CREATININE, CALCIUM,  in the last 72 hours CBG (last 3)  No results found for this basename: GLUCAP,  in the last 72 hours  Wt Readings from Last 3 Encounters:  07/17/14 65.772 kg (145 lb)  07/07/14 66.588 kg (146 lb 12.8 oz)  02/09/11 59.648 kg (131 lb 8 oz)    Physical Exam:  Constitutional: [name], place, and time. She appears well-developed and well-nourished.  HENT: oral mucosa moist Head: Normocephalic and atraumatic.  Eyes: Conjunctivae are normal. Pupils are equal, round, and reactive to light.  Neck: Normal range of motion. Neck supple.  Cardiovascular: Regular rhythm and rate  Respiratory: Effort normal and breath sounds normal. No respiratory distress. She has no wheezes. No rales GI: Soft. Bowel sounds are normal. She exhibits mild distension. There is no tenderness.  Musculoskeletal: She exhibits no edema. Tenderness to palpation over sternum and xyphoid Neurological: She is alert and oriented to person, place, and time.  LLE>RLE weakness.  3- R to 2 /5  L with HF/KE,  2+/5 PF/DF.  Upper extremities strength5/5 deltoid, bicep, tricep, grip   Skin: Skin is warm and dry.  Psychiatric: more pleasant and up beat.   Assessment/Plan: 1. Functional deficits secondary to thoracic spinal cord infarct causing paraplegia which  requires 3+ hours per day of interdisciplinary therapy in a comprehensive inpatient rehab setting. Physiatrist is providing close team supervision and 24 hour management of active medical problems listed below. Physiatrist and rehab team continue to assess barriers to discharge/monitor patient progress toward functional and medical goals.      FIM: FIM - Bathing Bathing Steps Patient Completed: Chest;Right Arm;Left Arm;Abdomen;Front perineal area;Right upper leg;Left upper leg;Buttocks;Right lower leg (including foot);Left lower leg (including foot) Bathing: 5: Supervision: Safety issues/verbal cues (using LH sponge)  FIM - Upper Body Dressing/Undressing Upper body dressing/undressing steps patient completed: Thread/unthread right sleeve of front closure shirt/dress;Thread/unthread left sleeve of front closure shirt/dress;Pull shirt around back of front closure shirt/dress Upper body dressing/undressing: 5: Set-up assist to: Obtain clothing/put away FIM - Lower Body Dressing/Undressing Lower body dressing/undressing steps patient completed: Fasten/unfasten pants Lower body dressing/undressing: 1: Total-Patient completed less than 25% of tasks  FIM - Hotel manager Devices: Grab bar or rail for support Toileting: 0: Activity did not occur  FIM - Diplomatic Services operational officer Devices: Psychiatrist Transfers: 0-Activity did not occur  FIM - Banker Devices: Bed rails;Arm rests (leg loops; HOB elevated 30 degrees) Bed/Chair Transfer: 1: Sit > Supine: Total A (helper does all/Pt. < 25%);4: Supine > Sit: Min A (steadying Pt. > 75%/lift 1 leg)  FIM - Locomotion: Wheelchair Distance: 150 Locomotion: Wheelchair: 0: Activity did not occur FIM - Locomotion: Ambulation Locomotion: Ambulation: 0: Activity did not occur  Comprehension Comprehension Mode: Auditory Comprehension: 5-Follows basic  conversation/direction: With  no assist  Expression Expression Mode: Verbal Expression: 5-Expresses basic needs/ideas: With no assist  Social Interaction Social Interaction: 5-Interacts appropriately 90% of the time - Needs monitoring or encouragement for participation or interaction.  Problem Solving Problem Solving: 5-Solves basic problems: With no assist  Memory Memory: 5-Recognizes or recalls 90% of the time/requires cueing < 10% of the time  Medical Problem List and Plan:  1. 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 DVT Prophylaxis/Anticoagulation: Mechanical: Antiembolism stockings, knee (TED hose) Bilateral lower extremities  Sequential compression devices, below knee Bilateral lower extremitie   -dopplers neg 3. Pain Management: Will increase oxycodone to 15 mg and discontinue prn dilaudid.  4. Mood: LCSW to follow for evaluation and support.  5. Neuropsych: This patient is capable of making decisions on her own behalf.   -xanax prn for anxiety.  -zoloft    -+ reinforcement by team---see above 6. Skin/Wound Care: Turn patient every 2 hours. Pressure relief measures. Maintain adequate hydration and nutritional status.  7. Hyponatremia: 134  8. Acute renal failure: push po fluids. Monitor for orthostasis. Labs holding steady 9. Constipation: +BM recently 10. CV/HTN--EKG normal  -working on BP mgt  --has hx of chest pain due to hematoma,effusions prior to transfer to rehab, likely musculoskeletal component also.   -rx pain symptomatically as well  -appreciate cards assist  LOS (Days) 10 A FACE TO FACE EVALUATION WAS PERFORMED  SWARTZ,ZACHARY T 07/18/2014 8:24 AM

## 2014-07-18 NOTE — Progress Notes (Signed)
Physical Therapy Session Note (make up session)  Patient Details  Name: Charlotte [name] MRN: [id number] Date of Birth: Apr 22, 1966  Today's Date: 07/18/2014 PT Individual Time: 0900 (make up session.)-0925 PT Individual Time Calculation (min): 25 min (missed 5 mins of make up session)  Short Term Goals: Week 2:  PT Short Term Goal 1 (Week 2): Pt will tolerate 60 minutes of therapeutic activity PT Short Term Goal 2 (Week 2): Pt will consistently transfer bed<>w/c w/ MinA PT Short Term Goal 3 (Week 2): Pt will perform all bed mobility w/ S w/ use of AE PT Short Term Goal 4 (Week 2): Pt will tolerate and participate in 180 minutes of therapy per day  Skilled Therapeutic Interventions/Progress Updates:   Pt received sitting in w/c in day room, having just worked with OT and finishing breakfast.  Pt agreeable to session with promise of assisting back to bed at end of session.  Attempted to engage pt in w/c propulsion, however she refused and states "I'm cold."  Assisted pt to/from therapy gym at total A and performed lateral scoot transfer at min/guard level with cues for increased anterior weight shift.  Provided questioning cues on set up of w/c and removal of parts prior to transfer.  She was able to get w/c close and lock brakes, however requires total A cues for removal of leg rests and also of arm rest.  While on EOM, focused on dynamic sitting balance with reaching activity and forward weight shifts on physioball.  Pt with increased pain in low back, therefore transitioned to scapular retractions x 10 reps with max A cues on correct technique, as she kept wanting to lean whole trunk posteriorly.  Pt assisted back to room and back to bed as stated above.  Assist for BLEs into bed.  Pt left in bed with all needs in reach.   Therapy Documentation Precautions:  Precautions Precautions: Fall Restrictions Weight Bearing Restrictions: No General: PT Amount of Missed Time (min): 5 Minutes (missed 5  mins of make up session) Vital Signs: Therapy Vitals BP: 145/79 mmHg Pain: Pt with 8/10 pain in back, states she has received pain meds, allowed rest breaks and assisted back to bed at end of session.      See FIM for current functional status  Therapy/Group: Individual Therapy  Vista Deck 07/18/2014, 9:26 AM

## 2014-07-19 ENCOUNTER — Inpatient Hospital Stay (HOSPITAL_COMMUNITY): Payer: Medicaid Other | Admitting: Physical Therapy

## 2014-07-19 ENCOUNTER — Inpatient Hospital Stay (HOSPITAL_COMMUNITY): Payer: Medicaid Other

## 2014-07-19 DIAGNOSIS — N183 Chronic kidney disease, stage 3 unspecified: Secondary | ICD-10-CM | POA: Diagnosis present

## 2014-07-19 DIAGNOSIS — I10 Essential (primary) hypertension: Secondary | ICD-10-CM

## 2014-07-19 DIAGNOSIS — E785 Hyperlipidemia, unspecified: Secondary | ICD-10-CM | POA: Diagnosis present

## 2014-07-19 MED ORDER — GABAPENTIN 100 MG PO CAPS
200.0000 mg | ORAL_CAPSULE | Freq: Three times a day (TID) | ORAL | Status: DC
  Administered 2014-07-19 – 2014-07-25 (×18): 200 mg via ORAL
  Filled 2014-07-19 (×21): qty 2

## 2014-07-19 NOTE — Progress Notes (Signed)
[place] PHYSICAL MEDICINE & REHABILITATION     PROGRESS NOTE    Subjective/Complaints: No new issues today. Pain,anxiety/mood continue to be limiting factors A  review of systems has been performed and if not noted above is otherwise negative.    Objective: Vital Signs: Blood pressure 140/60, pulse 76, temperature 98.8 F (37.1 C), temperature source Oral, resp. rate 18, height  (1.651 m), weight 65.772 kg (145 lb), SpO2 95.00%. No results found. No results found for this basename: WBC, HGB, HCT, PLT,  in the last 72 hours No results found for this basename: NA, K, CL, CO, GLUCOSE, BUN, CREATININE, CALCIUM,  in the last 72 hours CBG (last 3)  No results found for this basename: GLUCAP,  in the last 72 hours  Wt Readings from Last 3 Encounters:  07/17/14 65.772 kg (145 lb)  07/07/14 66.588 kg (146 lb 12.8 oz)  02/09/11 59.648 kg (131 lb 8 oz)    Physical Exam:  Constitutional: [name], place, and time. She appears well-developed and well-nourished.  HENT: oral mucosa moist Head: Normocephalic and atraumatic.  Eyes: Conjunctivae are normal. Pupils are equal, round, and reactive to light.  Neck: Normal range of motion. Neck supple.  Cardiovascular: Regular rhythm and rate  Respiratory: Effort normal and breath sounds normal. No respiratory distress. She has no wheezes. No rales GI: Soft. Bowel sounds are normal. She exhibits mild distension. There is no tenderness.  Musculoskeletal: She exhibits no edema. Tenderness to palpation over sternum and xyphoid Neurological: She is alert and oriented to person, place, and time.  LLE>RLE weakness.  3- R to 2 /5  L with HF/KE,  2+/5 PF/DF.  Upper extremities strength5/5 deltoid, bicep, tricep, grip   Skin: Skin is warm and dry.  Psychiatric: more pleasant and up beat.   Assessment/Plan: 1. Functional deficits secondary to thoracic spinal cord infarct causing paraplegia which requires 3+ hours per day of  interdisciplinary therapy in a comprehensive inpatient rehab setting. Physiatrist is providing close team supervision and 24 hour management of active medical problems listed below. Physiatrist and rehab team continue to assess barriers to discharge/monitor patient progress toward functional and medical goals.    If pt is unwilling to engage in therapy, need to consider shortening stay and discharging home  FIM: FIM - Bathing Bathing Steps Patient Completed: Chest;Right Arm;Left Arm;Abdomen;Front perineal area;Right upper leg;Left upper leg;Buttocks;Right lower leg (including foot);Left lower leg (including foot) Bathing: 5: Supervision: Safety issues/verbal cues (using LH sponge)  FIM - Upper Body Dressing/Undressing Upper body dressing/undressing steps patient completed: Thread/unthread right sleeve of front closure shirt/dress;Thread/unthread left sleeve of front closure shirt/dress;Pull shirt around back of front closure shirt/dress Upper body dressing/undressing: 5: Set-up assist to: Obtain clothing/put away FIM - Lower Body Dressing/Undressing Lower body dressing/undressing steps patient completed: Fasten/unfasten pants Lower body dressing/undressing: 1: Total-Patient completed less than 25% of tasks  FIM - Hotel manager Devices: Grab bar or rail for support Toileting: 0: Activity did not occur  FIM - Diplomatic Services operational officer Devices: Psychiatrist Transfers: 0-Activity did not occur  FIM - Banker Devices: Bed rails;Arm rests (leg loops; HOB elevated 30 degrees) Bed/Chair Transfer: 3: Supine > Sit: Mod A (lifting assist/Pt. 50-74%/lift 2 legs;3: Sit > Supine: Mod A (lifting assist/Pt. 50-74%/lift 2 legs);4: Chair or W/C > Bed: Min A (steadying Pt. > 75%);4: Bed > Chair or W/C: Min A (steadying Pt. > 75%)  FIM - Locomotion: Wheelchair Distance: 150  Locomotion: Wheelchair: 5: Travels 150 ft or  more: maneuvers on rugs and over door sills with supervision, cueing or coaxing FIM - Locomotion: Ambulation Locomotion: Ambulation: 0: Activity did not occur  Comprehension Comprehension Mode: Auditory Comprehension: 5-Follows basic conversation/direction: With extra time/assistive device  Expression Expression Mode: Verbal Expression: 5-Expresses basic needs/ideas: With no assist  Social Interaction Social Interaction: 5-Interacts appropriately 90% of the time - Needs monitoring or encouragement for participation or interaction.  Problem Solving Problem Solving: 5-Solves complex 90% of the time/cues < 10% of the time  Memory Memory: 5-Recognizes or recalls 90% of the time/requires cueing < 10% of the time  Medical Problem List and Plan:  1. 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 DVT Prophylaxis/Anticoagulation: Mechanical: Antiembolism stockings, knee (TED hose) Bilateral lower extremities  Sequential compression devices, below knee Bilateral lower extremitie   -dopplers neg 3. Pain Management: Will increase oxycodone to 15 mg and discontinue prn dilaudid.  4. Mood: LCSW to follow for evaluation and support.  5. Neuropsych: This patient is capable of making decisions on her own behalf.   -xanax prn for anxiety.  -zoloft    -+ reinforcement by team---see above 6. Skin/Wound Care: Turn patient every 2 hours. Pressure relief measures. Maintain adequate hydration and nutritional status.  7. Hyponatremia: 134  8. Acute renal failure: push po fluids. Monitor for orthostasis. Labs holding steady 9. Constipation: +BM recently 10. CV/HTN--EKG normal  -working on BP mgt  --has hx of chest pain due to hematoma,effusions prior to transfer to rehab, likely musculoskeletal component also.   -rx pain symptomatically as well  -appreciate cards assist  LOS (Days)  11 A FACE TO FACE EVALUATION WAS PERFORMED  Rolf Fells T 07/19/2014 8:36 AM

## 2014-07-19 NOTE — Progress Notes (Signed)
Physical Therapy Session Note  Patient Details  Name: Charlotte [name] MRN: [id number] Date of Birth: Jul 09, 1966  Today's Date: 07/19/2014 PT Individual Time: Tx 1: 1100-1200 Tx 2: 1300-1400 Treatment Time: 60 minutes; Tx 2: 60 min  Short Term Goals: Week 2:  PT Short Term Goal 1 (Week 2): Pt will tolerate 60 minutes of therapeutic activity PT Short Term Goal 2 (Week 2): Pt will consistently transfer bed<>w/c w/ MinA PT Short Term Goal 3 (Week 2): Pt will perform all bed mobility w/ S w/ use of AE PT Short Term Goal 4 (Week 2): Pt will tolerate and participate in 180 minutes of therapy per day  Skilled Therapeutic Interventions/Progress Updates:    Therapeutic Activity: PT instructs pt in bed mobility req SBA with head of bed rail, in scoot transfer bed to w/c req SBA for safety. Pt's fiance shows up and directs pt in setting up w/c close to bed and provides SBA when pt scoots from w/c to bed. PT instructs pt in use of leg lifter to get B legs into bed; pt is almost able to get R leg into bed by herself, but still needs slight min A to fully get R leg into bed. Pt has significant difficulty getting L leg into bed and req assist for this leg with the leg lifter, as well. Pt req CGA to scoot in supine to straighten out in bed.   W/C Management: PT instructs pt in legrest management: donning and doffing legrests. PT brought pt a 16x18 manual w/c to assess the smaller width size and it fits pt well. PT explained to pt that the smaller w/c size will ensure she is able to fit through narrow doorways in her daughter's older home and it will also help pt propel w/c more efficiently with her B UEs.  PT instructs pt in w/c propulsion within room due to her refusing to go out of her room x 20' including a u-turn; pt req verbal cues for arm usage when making a u-turn.   Therapeutic Exercise: PT instructs pt in strengthening and ROM exercises to B LEs: ankle pumps x 10, supine hip abduction/adduction x 10,  heel cord stretch x 1 minute each, heel slides  x 10, bridges x 10, hip IR in B hook lie x 10, hamstring stretch x 1 minute each: all done P/AAROM as needed.  PT educates fiance in how to do a heel cord stretch and hamstring stretch; he demonstrates understanding.   Tx 2: Therapeutic Activity: PT instructs pt in rolling R and L without rail req min A to change soiled brief. PT instructs pt in supine to sit req min A without rail. PT instructs pt in scoot transfer bed to w/c req min A due to poor foot placement; PT gives pt verbal cues for foot placement after the transfer and she verbalizes understanding. PT instructs pt in w/c to bed scoot transfer req CGA and verbal cues to lean forward, sit to supine transfer with leg lifter req mod A.   W/C Management: PT instructs pt in w/c propulsion x 400' req SBA and verbal cues for turns, on level and slightly ramped surfaces in the community.   Therapeutic Exercise: PT instructs pt in B LE exercises while seated in the w/c: LAQ AROM R LE, P/AAROM L LE LAQ, AAROM seated march with min resistance on extension, AROM R knee flexion in sit, AAROM L knee flexion in sit x 10 reps each.   Pt is in a  foul mood first session because her oldest daughter Charlotte [name] was supposed to show up for family training, but did not come. Fiance does a good job helping calm pt down and encouraging her. Pt demonstrates bed mobility with SBA getting out of bed into w/c because she is angry and does it all by herself. During session 2: pt initially refuses to participate, but eventually agrees to go outside and at the end of therapy session thanks PT for bringing her outside. Pt will benefit from continued w/c management training, generalized activity tolerance and conditioning, and transfer training with focus on safety, as well as family education.    Therapy Documentation Precautions:  Precautions Precautions: Fall Restrictions Weight Bearing Restrictions: No Vital  Signs: Therapy Vitals Pulse Rate: 72 BP: 125/54 mmHg Patient Position (if appropriate): Lying Oxygen Therapy SpO2: 97 % O2 Device: None (Room air) Pain: Pain Assessment Pain Assessment: 0-10 Pain Score: 9  Pain Type: Other (Comment) (all over) Pain Location: Back Pain Orientation: Lower Pain Descriptors / Indicators: Radiating (to B legs) Pain Onset: On-going Pain Intervention(s): Distraction Multiple Pain Sites: Yes 2nd Pain Site Pain Score: 9 Pain Type: Acute pain Pain Location: Abdomen (constipation) Pain Onset: Gradual Pain Intervention(s): Rest Tx 2: Pt c/o 9/10 pain "everywhere". PT utilizes distraction to decrease pain.  See FIM for current functional status  Therapy/Group: Individual Therapy  Charlotte [name] M 07/19/2014, 11:05 AM

## 2014-07-19 NOTE — Plan of Care (Signed)
Problem: SCI BOWEL ELIMINATION Goal: RH STG MANAGE BOWEL WITH ASSISTANCE STG Manage Bowel with Min Assistance.  Outcome: Not Progressing Patient continues to have episodes of bowel incontinence

## 2014-07-19 NOTE — Progress Notes (Signed)
Patient stated that she has pain 8/10 legs, and muscle spasms, patient stated that she wanted 3 pills, provided patient with  of oxy and  of robaxin. After med pass patient stated that she watned the full  dose of oxy, provided patient with additional . Will  Continue to monitor. Diamantina Monks, RN

## 2014-07-19 NOTE — Progress Notes (Signed)
Patient refused scds. Educated patient on their importance, patient voiced understanding, still refused.  Charlotte [name]'

## 2014-07-19 NOTE — Progress Notes (Signed)
Occupational Therapy Session Note  Patient Details  Name: Charlotte [name] MRN: [id number] Date of Birth: Nov 30, 1965  Today's Date: 07/19/2014 OT Individual Time: 4975-3005 OT Individual Time Calculation (min): 60 min    Short Term Goals: Week 2:  OT Short Term Goal 1 (Week 2): Patient will complete lower body dressing with min assist using AE prn OT Short Term Goal 1 - Progress (Week 2): Progressing toward goal OT Short Term Goal 2 (Week 2): Patient will complete transfer on/off tub bench in standard tub with min assist to manage BLE OT Short Term Goal 2 - Progress (Week 2): Met OT Short Term Goal 3 (Week 2): Patient will demo ability to complete HEP for BUE strengthening and endurance with supervision OT Short Term Goal 3 - Progress (Week 2): Progressing toward goal OT Short Term Goal 4 (Week 2): Pt will demo ability to retrieve beverage and/or food items as needed for simple meal prep at w/c level OT Short Term Goal 4 - Progress (Week 2): Progressing toward goal  Skilled Therapeutic Interventions/Progress Updates: ADL-retraining with focus on family ed and training with fiance John on transfers (w/c>bed), standard tub bench in tub transfer, adapted bathing, and improved activity tolerance.   Pt received supine in bed initially unwilling to participate due to recent suppository provided.   After remotivation and encouragement by therapist and fiance, pt agreed to practice bathing in standard tub using tub bench.   Pt required only use of bed rails this session to rise from supine to sitting at edge of bed and then performed lateral transfer to w/c with steadying assist for safety; fiance refused opportunity to practice but watched as OT instructed on technique for assisting transfers.   Pt performed similar transfer w/c<>tub bench and showered using lateral leans to wash buttocks.   Pt declined dressing due to no clean clothes present and merely donned gown with setup.   Fiance assisted with  transfer back to bed as pt reported need to toilet and refused transfer to [facility] or toilet d/t fatigue from activity.   RN notified on pt status.    Therapy Documentation Precautions:  Precautions Precautions: Fall Restrictions Weight Bearing Restrictions: No  Vital Signs: Therapy Vitals BP: 140/60 mmHg  Pain: Pain Assessment Pain Assessment: 0-10 Pain Score: 8  Pain Type: Acute pain Pain Location: Leg Pain Orientation: Right;Left Pain Descriptors / Indicators: Aching;Throbbing Pain Onset: On-going Pain Intervention(s): Medication (See eMAR)  ADL: ADL ADL Comments: see FIM   See FIM for current functional status  Therapy/Group: Individual Therapy  Dorris Vangorder 07/19/2014, 9:44 AM

## 2014-07-19 NOTE — Plan of Care (Signed)
Problem: SCI BLADDER ELIMINATION Goal: RH STG MANAGE BLADDER WITH ASSISTANCE STG Manage Bladder With Min Assistance  Outcome: Not Progressing Patient continues to have episodes of bladder incontinence

## 2014-07-19 NOTE — Progress Notes (Signed)
Social Work Patient ID: Charlotte [name], female   DOB: Oct 04, 1966, 48 y.o.   MRN: [id number]  Met with pt and daughters this afternoon.  Discussed concerns of limited participation from pt so far.   Daughter, Charlotte [name], nodding in agreement and also encouraging pt to improve on this.  Discussed need to begin "hands on" education with daughter to determine if she has any concerns about providing care at home.  Planning to begin Monday afternoon with OT.  Daughter appears determined to take pt home and provide all care (with assist of pt's boyfriend.)  Aware team continues to aim for 9/4 discharge at this point.  Serenidy Waltz, LCSW

## 2014-07-19 NOTE — Progress Notes (Signed)
    Subjective:  No complaints  Objective:  Vital Signs in the last 24 hours: Temp:  [98.5 F (36.9 C)-99.1 F (37.3 C)] 98.8 F (37.1 C) (08/28 0540) Pulse Rate:  [66-82] 76 (08/28 0540) Resp:  [16-18] 18 (08/28 0540) BP: (100-152)/(53-74) 140/60 mmHg (08/28 0553) SpO2:  [95 %-98 %] 95 % (08/28 0540)  Intake/Output from previous day:  Intake/Output Summary (Last 24 hours) at 07/19/14 0944 Last data filed at 07/19/14 0913  Gross per 24 hour  Intake    625 ml  Output      0 ml  Net    625 ml    Physical Exam: General appearance: alert, cooperative and no distress Lungs: clear to auscultation bilaterally Heart: regular rate and rhythm   Rate: 74  Rhythm: NSR on exam, not on telemetry  Lab Results: No results found for this basename: WBC, HGB, PLT,  in the last 72 hours No results found for this basename: NA, K, CL, CO2, GLUCOSE, BUN, CREATININE,  in the last 72 hours No results found for this basename: TROPONINI, CK, MB,  in the last 72 hours No results found for this basename: INR,  in the last 72 hours  Imaging: Imaging results have been reviewed  Cardiac Studies: Echo 06/30/14 Study Conclusions  - Left ventricle: The cavity size was normal. There was moderate concentric hypertrophy. Systolic function was normal. The estimated ejection fraction was in the range of 60% to 65%. Wall motion was normal; there were no regional wall motion abnormalities. Doppler parameters are consistent with abnormal left ventricular relaxation (grade 1 diastolic dysfunction). The E/e&' ratio is <8, suggesting normal LV filling pressure. - Aorta: No clear dissection flap visualized in this study. The aortic arch size is generous, the descending aorta is well visualized and no evidence for dissection is noted. - Left atrium: The atrium was normal in size. - Tricuspid valve: There was mild regurgitation. - Pulmonary arteries: PA peak pressure: 26 mm Hg (S). - Pericardium,  extracardiac: There was no pericardial effusion.  Impressions:  - LVEF 60-65%, moderate concentric LVH, normal LA size, generous aortic arch without clear visualization of a dissection flap, normal descending aorta, diastolic dysfunction, normal LV fililng pressure, no pericardial effusion.    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   Corine Shelter PA-C Beeper [phone] 07/19/2014, 9:44 AM  History and all data above reviewed.  Patient examined.  I agree with the findings as above.  The patient exam reveals COR:RRR  ,  Lungs: Clear  ,  Abd: Positive bowel sounds, no rebound no guarding, Ext No edema  .  All available labs, radiology testing, previous records reviewed. Agree with documented assessment and plan. HTN:  At this point her BP is better controlled than it has been.  I would not adjust meds further.  Continue the meds as listed and we can follow as an outpatient.  I did discuss with her how to keep a BP diary.     Fayrene Fearing Breonna Gafford  12:54 PM  07/19/2014

## 2014-07-20 ENCOUNTER — Inpatient Hospital Stay (HOSPITAL_COMMUNITY): Payer: Self-pay | Admitting: Physical Therapy

## 2014-07-20 ENCOUNTER — Inpatient Hospital Stay (HOSPITAL_COMMUNITY): Payer: Self-pay | Admitting: *Deleted

## 2014-07-20 ENCOUNTER — Inpatient Hospital Stay (HOSPITAL_COMMUNITY): Payer: Medicaid Other

## 2014-07-20 DIAGNOSIS — F1410 Cocaine abuse, uncomplicated: Secondary | ICD-10-CM

## 2014-07-20 DIAGNOSIS — G8220 Paraplegia, unspecified: Secondary | ICD-10-CM

## 2014-07-20 DIAGNOSIS — N183 Chronic kidney disease, stage 3 unspecified: Secondary | ICD-10-CM

## 2014-07-20 DIAGNOSIS — E785 Hyperlipidemia, unspecified: Secondary | ICD-10-CM

## 2014-07-20 DIAGNOSIS — D649 Anemia, unspecified: Secondary | ICD-10-CM

## 2014-07-20 NOTE — Progress Notes (Signed)
Physical Therapy Session Note  Patient Details  Name: Charlotte [name] MRN: [id number] Date of Birth: 08/09/1966  Today's Date: 07/20/2014 PT Individual Time: 0852-0945 PT Individual Time Calculation (min): 53 min  and Today's Date: 07/20/2014 PT Missed Time: 7 Minutes (late from previous pt) Missed Time Reason: Other (Comment)  Short Term Goals: Week 1:  PT Short Term Goal 1 (Week 1): Pt will tolerate 60 minutes of therapeutic activity PT Short Term Goal 1 - Progress (Week 1): Progressing toward goal PT Short Term Goal 2 (Week 1): Pt will perform all bed mobility w/ MinA PT Short Term Goal 2 - Progress (Week 1): Progressing toward goal PT Short Term Goal 3 (Week 1): Pt will tolerate sitting up in wheelchair for 3 hrs at a time PT Short Term Goal 3 - Progress (Week 1): Met PT Short Term Goal 4 (Week 1): Pt will consistently transfer bed<>w/c w/ MinA PT Short Term Goal 4 - Progress (Week 1): Progressing toward goal PT Short Term Goal 5 (Week 1): Pt will perform sit<>stand w/ ModA PT Short Term Goal 5 - Progress (Week 1): Not progressing Week 2:  PT Short Term Goal 1 (Week 2): Pt will tolerate 60 minutes of therapeutic activity PT Short Term Goal 2 (Week 2): Pt will consistently transfer bed<>w/c w/ MinA PT Short Term Goal 3 (Week 2): Pt will perform all bed mobility w/ S w/ use of AE PT Short Term Goal 4 (Week 2): Pt will tolerate and participate in 180 minutes of therapy per day Week 3:    Week 4:     Skilled Therapeutic Interventions/Progress Updates:    Pt educated on importance of OOB activities and progressive mobilization, yet still declines performing more than bed level activities. Pt reports some short term relief with manual techniques, but no carryover noted. Pt does report stretching has been helping her manage pain and mobility over last few sessions. Pt with increasing fatigue as session progresses despite low intensity of activity.  Therapy Documentation Precautions:   Precautions Precautions: Fall Restrictions Weight Bearing Restrictions: No General: PT Amount of Missed Time (min): 7 Minutes (late from previous pt) PT Missed Treatment Reason: Other (Comment) Pain: Pain Assessment Pain Assessment: 0-10 Pain Score: 8  Pain Type: Chronic pain Pain Location: Leg Pain Orientation: Left;Right Pain Descriptors / Indicators: Aching Pain Onset: On-going Pain Intervention(s):  (manual and graded activity) Mobility:   Min A for all bed mobility with cues for encouragement Balance:   Other Treatments:  Pt performs heel slides, ankle pumps, SAQs, hip abd AROM, bridging hip ER AROM, quad sets, glute sets 2x10. Pt educated on importance of OOB activities and progressive mobilization. HS, gastroc, quad, and hip add stretch 3x30 sec. Quad, HS, gastroc releases performed. B/L LE long axis distraction performed.  See FIM for current functional status  Therapy/Group: Individual Therapy  Kinney, Steven G 07/20/2014, 9:30 AM  

## 2014-07-20 NOTE — Progress Notes (Signed)
Charlotte [name] is a 48 y.o. female Aug 29, 1966 [id number]  Subjective: No new complaints. No new problems. Slept well. Feeling OK.  Objective: Vital signs in last 24 hours: Temp:  [97.8 F (36.6 C)-98.3 F (36.8 C)] 98.3 F (36.8 C) (08/29 0529) Pulse Rate:  [62-73] 73 (08/29 0529) Resp:  [15-18] 18 (08/29 0529) BP: (112-135)/(53-67) 112/53 mmHg (08/29 0529) SpO2:  [94 %-97 %] 96 % (08/28 2129) Weight change:  Last BM Date: 07/17/14  Intake/Output from previous day: 08/28 0701 - 08/29 0700 In: 720 [P.O.:720] Out: -  Last cbgs: CBG (last 3)  No results found for this basename: GLUCAP,  in the last 72 hours   Physical Exam General: No apparent distress   HEENT: not dry Lungs: Normal effort. Lungs clear to auscultation, no crackles or wheezes. Cardiovascular: Regular rate and rhythm, no edema Abdomen: S/NT/ND; BS(+) Musculoskeletal:  unchanged Neurological: No new neurological deficits Wounds: N/A    Skin: clear   Mental state: Alert, oriented, cooperative    Lab Results: BMET    Component Value Date/Time   NA 134* 07/09/2014 0620   K 4.4 07/09/2014 0620   CL 99 07/09/2014 0620   CO2 19 07/09/2014 0620   GLUCOSE 106* 07/09/2014 0620   BUN 30* 07/09/2014 0620   CREATININE 1.54* 07/09/2014 0620   CALCIUM 9.4 07/09/2014 0620   GFRNONAA 38* 07/09/2014 0620   GFRAA 44* 07/09/2014 0620   CBC    Component Value Date/Time   WBC 7.2 07/09/2014 0620   RBC 3.88 07/09/2014 0620   RBC 3.94 12/02/2007 0940   HGB 10.8* 07/09/2014 0620   HCT 32.7* 07/09/2014 0620   PLT 326 07/09/2014 0620   MCV 84.3 07/09/2014 0620   MCH 27.8 07/09/2014 0620   MCHC 33.0 07/09/2014 0620   RDW 14.7 07/09/2014 0620   LYMPHSABS 1.0 07/09/2014 0620   MONOABS 0.7 07/09/2014 0620   EOSABS 0.2 07/09/2014 0620   BASOSABS 0.0 07/09/2014 0620    Studies/Results: No results found.  Medications: I have reviewed the patient's current medications.  Assessment/Plan:  1. 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 DVT Prophylaxis/Anticoagulation: Mechanical: Antiembolism stockings, knee (TED hose) Bilateral lower extremities  Sequential compression devices, below knee Bilateral lower extremitie  -dopplers neg  3. Pain Management: Will increase oxycodone to 15 mg and discontinue prn dilaudid.  4. Mood: LCSW to follow for evaluation and support.  5. Neuropsych: This patient is capable of making decisions on her own behalf.  -xanax prn for anxiety.  -zoloft  -+ reinforcement by team---see above  6. Skin/Wound Care: Turn patient every 2 hours. Pressure relief measures. Maintain adequate hydration and nutritional status.  7. Hyponatremia: 134  8. Acute renal failure: push po fluids. Monitor for orthostasis. Labs holding steady  9. Constipation: +BM recently  10. CV/HTN--EKG normal  -working on BP mgt  --has hx of chest pain due to hematoma,effusions prior to transfer to rehab, likely musculoskeletal component also.  -rx pain symptomatically as well  -appreciate cards assist  Cont Rx     Length of stay, days: 12  Sonda Primes , MD 07/20/2014, 9:41 AM

## 2014-07-20 NOTE — Progress Notes (Signed)
Occupational Therapy Session Note  Patient Details  Name: Charlotte [name] MRN: [id number] Date of Birth: February 27, 1966  Today's Date: 07/20/2014 OT Individual Time: 1000-1030 OT Individual Time Calculation (min): 30 min    Short Term Goals: Week 3:  OT Short Term Goal 1 (Week 3): STG=LTG due to short LOS  Skilled Therapeutic Interventions/Progress Updates: ADL-retraining with focus on improved bed mobility, toileting, and activity tolerance.   Pt received supine in bed reporting need for assist with toilet hygiene due to soiled diaper and bed sheets.    Pt denied need to toilet on BSC d/t having completed voiding urine in diaper.   Pt was able to break taped border of diaper but was unable to extract diaper independently d/t LE weakness.   OT removed diaper and facilitated rolls, left to right, after gently ROM of BLE.   Pt performed hygiene with only setup assist while supine and required min assist to flex hip/knee during rolls.   Pt completed cleaning buttocks (75% thorough) after setup with washcloth .  Pt required total assist to don new diaper and then became exhausted from activities and declined further participation with transfers or planned BADL at sink or shower. Pt left in bed, supine with call light and phone placed within reach.     Therapy Documentation Precautions:  Precautions Precautions: Fall Restrictions Weight Bearing Restrictions: No  General: General OT Amount of Missed Time: 30 Minutes PT Missed Treatment Reason: Other (Comment)  Pain: Pain Assessment Pain Assessment: 0-10 Pain Score: 8  Pain Type: Chronic pain Pain Location: Leg Pain Orientation: Left;Right Pain Descriptors / Indicators: Aching Pain Onset: On-going Pain Intervention(s):  (manual and graded activity)  ADL: ADL ADL Comments: see FIM  See FIM for current functional status  Therapy/Group: Individual Therapy  Charlotte [name] 07/20/2014, 12:24 PM

## 2014-07-21 ENCOUNTER — Inpatient Hospital Stay (HOSPITAL_COMMUNITY): Payer: Self-pay | Admitting: Physical Therapy

## 2014-07-21 DIAGNOSIS — I71019 Dissection of thoracic aorta, unspecified: Secondary | ICD-10-CM

## 2014-07-21 DIAGNOSIS — I7101 Dissection of thoracic aorta: Secondary | ICD-10-CM

## 2014-07-21 NOTE — Progress Notes (Signed)
Pt. Has been very lethargic and refuses bathing/dressing or other activities during daytime hours.  Pt. States she is "very weak" and "needs rest".   Refused therapy this AM due to "anxiety".  Patient educated on the importance of daytime activity and working through therapies. Patient says she is "afraid of falling".  RN reassured patient of prioritizing safety on the unit. Discussions should continue regarding choices/consequences when not participating in rehab program to ensure patient's participation.

## 2014-07-21 NOTE — Progress Notes (Addendum)
0900 Patient called RN to room.  Upon entering, RN found patient with rapid breathing and pt. Reported "trouble breathing".  Patient denies chest pain.  VSS obtained and appear stable. (see doc flow tab)  MD called to patient room and advised RN to give xanax as ordered.  RN continuing to monitor patient.  Therapy session canceled due to adverse event.

## 2014-07-21 NOTE — Progress Notes (Signed)
Charlotte [name] is a 48 y.o. female [birth year] [id number]  Subjective: No new complaints. Slept well. Feeling OK.  Objective: Vital signs in last 24 hours: Temp:  [97.4 F (36.3 C)-98.8 F (37.1 C)] 98.1 F (36.7 C) (08/30 0635) Pulse Rate:  [68-76] 68 (08/30 0635) Resp:  [16-18] 18 (08/30 0635) BP: (117-160)/(61-84) 146/67 mmHg (08/30 0635) SpO2:  [92 %-93 %] 92 % (08/30 4098) Weight change:  Last BM Date: 07/17/14 (refusing supp per report)  Intake/Output from previous day: 08/29 0701 - 08/30 0700 In: 1320 [P.O.:1320] Out: -  Last cbgs: CBG (last 3)  No results found for this basename: GLUCAP,  in the last 72 hours   Physical Exam General: No apparent distress   HEENT: not dry Lungs: Normal effort. Lungs clear to auscultation, no crackles or wheezes. Cardiovascular: Regular rate and rhythm, no edema Abdomen: S/NT/ND; BS(+) Musculoskeletal:  unchanged Neurological: No new neurological deficits Wounds: N/A    Skin: clear   Mental state: Alert, oriented, cooperative    Lab Results: BMET    Component Value Date/Time   NA 134* 07/09/2014 0620   K 4.4 07/09/2014 0620   CL 99 07/09/2014 0620   CO2 19 07/09/2014 0620   GLUCOSE 106* 07/09/2014 0620   BUN 30* 07/09/2014 0620   CREATININE 1.54* 07/09/2014 0620   CALCIUM 9.4 07/09/2014 0620   GFRNONAA 38* 07/09/2014 0620   GFRAA 44* 07/09/2014 0620   CBC    Component Value Date/Time   WBC 7.2 07/09/2014 0620   RBC 3.88 07/09/2014 0620   RBC 3.94 12/02/2007 0940   HGB 10.8* 07/09/2014 0620   HCT 32.7* 07/09/2014 0620   PLT 326 07/09/2014 0620   MCV 84.3 07/09/2014 0620   MCH 27.8 07/09/2014 0620   MCHC 33.0 07/09/2014 0620   RDW 14.7 07/09/2014 0620   LYMPHSABS 1.0 07/09/2014 0620   MONOABS 0.7 07/09/2014 0620   EOSABS 0.2 07/09/2014 0620   BASOSABS 0.0 07/09/2014 0620    Studies/Results: No results found.  Medications: I have reviewed the patient's current medications.  Assessment/Plan:  1. 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 DVT Prophylaxis/Anticoagulation: Mechanical: Antiembolism stockings, knee (TED hose) Bilateral lower extremities  Sequential compression devices, below knee Bilateral lower extremitie  -dopplers neg  3. Pain Management: Will increase oxycodone to 15 mg and discontinue prn dilaudid.  4. Mood: LCSW to follow for evaluation and support.  5. Neuropsych: This patient is capable of making decisions on her own behalf.  -xanax prn for anxiety.  -zoloft  -+ reinforcement by team---see above  6. Skin/Wound Care: Turn patient every 2 hours. Pressure relief measures. Maintain adequate hydration and nutritional status.  7. Hyponatremia: 134  8. Acute renal failure: push po fluids. Monitor for orthostasis. Labs holding steady  9. Constipation: +BM recently  10. CV/HTN--EKG normal  -working on BP mgt  --has hx of chest pain due to hematoma,effusions prior to transfer to rehab, likely musculoskeletal component also.  -rx pain symptomatically as well  -appreciate cards assist  Cont current Rx     Length of stay, days: 13  Sonda Primes , MD 07/21/2014, 8:19 AM

## 2014-07-21 NOTE — Progress Notes (Signed)
Physical Therapy Note  Patient Details  Name: Charlotte [name] MRN: [id number] Date of Birth: April 27, 1966 Today's Date: 07/21/2014   Pt initially attempted at 9 am, but session held secondary to "funny feeling in chest" and SOB. As per RN, pt had panic attack. When attempted later pt refused despite education  [name] 07/21/2014, 2:42 PM

## 2014-07-22 ENCOUNTER — Inpatient Hospital Stay (HOSPITAL_COMMUNITY): Payer: Medicaid Other

## 2014-07-22 ENCOUNTER — Inpatient Hospital Stay (HOSPITAL_COMMUNITY): Payer: Self-pay

## 2014-07-22 DIAGNOSIS — I10 Essential (primary) hypertension: Secondary | ICD-10-CM

## 2014-07-22 DIAGNOSIS — F1410 Cocaine abuse, uncomplicated: Secondary | ICD-10-CM

## 2014-07-22 DIAGNOSIS — G9519 Other vascular myelopathies: Secondary | ICD-10-CM

## 2014-07-22 DIAGNOSIS — G9782 Other postprocedural complications and disorders of nervous system: Secondary | ICD-10-CM

## 2014-07-22 LAB — BASIC METABOLIC PANEL
Anion gap: 13 (ref 5–15)
BUN: 21 mg/dL (ref 6–23)
CALCIUM: 9.6 mg/dL (ref 8.4–10.5)
CO2: 25 mEq/L (ref 19–32)
Chloride: 98 mEq/L (ref 96–112)
Creatinine, Ser: 1.32 mg/dL — ABNORMAL HIGH (ref 0.50–1.10)
GFR calc Af Amer: 53 mL/min — ABNORMAL LOW (ref >90)
GFR, EST NON AFRICAN AMERICAN: 46 mL/min — AB (ref >90)
GLUCOSE: 98 mg/dL (ref 70–99)
Potassium: 3.2 mEq/L — ABNORMAL LOW (ref 3.7–5.3)
Sodium: 136 mEq/L — ABNORMAL LOW (ref 137–147)

## 2014-07-22 MED ORDER — SORBITOL 70 % SOLN
45.0000 mL | Freq: Once | Status: AC
  Administered 2014-07-22: 45 mL via ORAL
  Filled 2014-07-22: qty 60

## 2014-07-22 MED ORDER — POTASSIUM CHLORIDE CRYS ER 20 MEQ PO TBCR
20.0000 meq | EXTENDED_RELEASE_TABLET | Freq: Every day | ORAL | Status: DC
  Administered 2014-07-22 – 2014-07-24 (×3): 20 meq via ORAL
  Filled 2014-07-22 (×5): qty 1

## 2014-07-22 NOTE — Progress Notes (Signed)
Physical Therapy Session Note  Patient Details  Name: Charlotte [name] MRN: [id number] Date of Birth: November 07, 1966  Today's Date: 07/22/2014 PT Individual Time: 0901-1001 PT Individual Time Calculation (min): 60 min   Short Term Goals: Week 2:  PT Short Term Goal 1 (Week 2): Pt will tolerate 60 minutes of therapeutic activity PT Short Term Goal 2 (Week 2): Pt will consistently transfer bed<>w/c w/ MinA PT Short Term Goal 3 (Week 2): Pt will perform all bed mobility w/ S w/ use of AE PT Short Term Goal 4 (Week 2): Pt will tolerate and participate in 180 minutes of therapy per day  Skilled Therapeutic Interventions/Progress Updates:    Pt received supine in bed w/ daughter present, agreeable to participate in therapy w/ mod encouragement from therapist and dtr. Session focused on functional mobility, family education. Pt moved supine>sit w/ S, able to manage legs out of bed without assist. Throughout session pt instructed therapist and dtr in setting up w/c for transfers, able to consistently instruct correctly w/ min cueing. Pt w/ scoot transfers throughout session MinA to SBA depending on setup, benefited from assist for steadying w/c and VC's for foot placement <25% of the time Pt propelled w/c 150' to rehab gym w/ S, therapist instructed dtr in negotiating w/c up/down 1 step onto porch of home. Daughter demonstrated ability to manage w/c up step, would benefit from further practice for managing w/c down step. Pt worked on bed mobility on flat bed w/o rails, consistent S for supine>sit but MinA for sit>supine 2/2 difficulty managing BLE into bed. Pt willing to use leg lifter but requires assist to hook feet with it. Pt w/ scoot transfer on/off sofa in ADL apartment, MinA for setup and S for transfer w/c>couch, MinA for transfer couch>w/c due to going uphill and pushing from softer surface. Pt left seated in w/c w/ dtr present w/ all needs within reach.  Therapy Documentation Precautions:   Precautions Precautions: Fall Restrictions Weight Bearing Restrictions: No Vital Signs: Therapy Vitals Temp: 98 F (36.7 C) Temp src: Oral Pulse Rate: 75 Resp: 18 BP: 132/77 mmHg Patient Position (if appropriate): Lying Oxygen Therapy SpO2: 94 % O2 Device: None (Room air) Pain: Pain Assessment Pain Assessment: 0-10 Pain Score: 3  Pain Type: Chronic pain Pain Location: Generalized Pain Orientation: Right;Left Pain Descriptors / Indicators: Aching Pain Onset: On-going Pain Intervention(s): Medication (See eMAR)  See FIM for current functional status  Therapy/Group: Individual Therapy  Hosie Spangle Hosie Spangle, PT, DPT 07/22/2014, 10:37 AM

## 2014-07-22 NOTE — Progress Notes (Signed)
Occupational Therapy Session Note  Patient Details  Name: Charlotte [name] MRN: [id number] Date of Birth: [birth year]  Today's Date: 07/22/2014 OT Individual Time: 5284-1324 OT Individual Time Calculation (min): 60 min    Short Term Goals: Week 3:  OT Short Term Goal 1 (Week 3): STG=LTG due to short LOS  Skilled Therapeutic Interventions/Progress Updates: ADL-retraining with focus on improved activity tolerance, transfers, family education (with SO and daughter), adapted lower body dressing skills, dynamic sitting balance and safety awareness.   Pt received supine in bed asleep.   With moderate environmental stimulation and prompts, pt rose from supine to sitting at edge of bed unassisted using bed rail.   Pt consumed partial breakfast unassisted and agreed to shower and dress, although rejecting use of shirt in favor of loose gown.   Patient required min-mod assist to perform lateral transfer to w/c with verbal cues to reach for armrest and attend to LE.   Pt completed similar transfer to tub bench in shower but completed shower unassisted using lateral leans to wash buttocks and LH sponge for feet.   Pt requires mod assist to exit shower due to reduced contact/stability using her feet to assist with lifting.  Pt returned to edge of bed and required total assist to dress lower body due to depleted energy after bathing.   Daughter arrived during dressing and was educated on pt's performance and need for assist with all lower body ADL.   Pt recovered to supine in bed with SO assisting to lift both legs and to don TEDs.  RN arrived to attend to pain management at end of session.     Therapy Documentation Precautions:  Precautions Precautions: Fall Restrictions Weight Bearing Restrictions: No  Vital Signs: Therapy Vitals Temp: 98 F (36.7 C) Temp src: Oral Pulse Rate: 75 Resp: 18 BP: 132/77 mmHg Patient Position (if appropriate): Lying Oxygen Therapy SpO2: 94 % O2 Device: None (Room  air)  Pain: Pain Assessment Pain Assessment: 0-10 Pain Score: 8  Pain Type: Chronic pain Pain Location: Leg Pain Orientation: Right;Left Pain Descriptors / Indicators: Aching Pain Onset: On-going Pain Intervention(s): Medication (See eMAR)  ADL: ADL ADL Comments: see FIM  See FIM for current functional status  Therapy/Group: Individual Therapy  Second session: Time: 1330-1430 Time Calculation (min):  60 min  Pain Assessment: 5/10, intermittent LE spasms, repositioned/distracted  Skilled Therapeutic Interventions: ADL-retraining with emphasis on family ed with primary caregiver, Charlotte [name] (daughter).   Pr received supine in bed with daughter at bedside reporting that her mother was exhibiting new signs of anxiety.   RN notified and provided meds.   After reorientation to priority of tasks, with daughter providing min assist, pt completed bed mobility, lateral transfer to w/c and tub bench transfers (in and out).  OT also educated daughter on pt's deficits with lower body BADL (from mod to total assist with lower body dressing) and on simple HEP to increase LE strength and performance with sit<>stand using countertop and w/c for safety.   Pt returned to bed at end of session and required mod assist to lift both legs.   OT, SW and daughter discussed need for DME with OT recommending hospital bed w/half-rails, drop-arm commode, and tub bench.   Daughter affirmed plan to attend BADL session providing hands on assist, as needed, with OT supervision.  See FIM for current functional status  Therapy/Group: Individual Therapy  Charlotte [name] 07/22/2014, 9:06 AM

## 2014-07-22 NOTE — Progress Notes (Signed)
[place] [facility]ICAL MEDICINE & REHABILITATION     PROGRESS NOTE    Subjective/Complaints: Refuses therapy, irritable, withdrawn at times A  review of systems has been performed and if not noted above is otherwise negative.    Objective: Vital Signs: Blood pressure 132/77, pulse 75, temperature 98 F (36.7 C), temperature source Oral, resp. rate 18, height  (1.651 m), weight 65.772 kg (145 lb), SpO2 94.00%. No results found. No results found for this basename: WBC, HGB, HCT, PLT,  in the last 72 hours  Recent Labs  07/22/14 0600  NA 136*  K 3.2*  CL 98  GLUCOSE 98  BUN 21  CREATININE 1.32*  CALCIUM 9.6   CBG (last 3)  No results found for this basename: GLUCAP,  in the last 72 hours  Wt Readings from Last 3 Encounters:  07/17/14 65.772 kg (145 lb)  07/07/14 66.588 kg (146 lb 12.8 oz)  02/09/11 59.648 kg (131 lb 8 oz)    Physical Exam:  Constitutional: [name], place, and time. She appears well-developed and well-nourished.  HENT: oral mucosa moist Head: Normocephalic and atraumatic.  Eyes: Conjunctivae are normal. Pupils are equal, round, and reactive to light.  Neck: Normal range of motion. Neck supple.  Cardiovascular: Regular rhythm and rate  Respiratory: Effort normal and breath sounds normal. No respiratory distress. She has no wheezes. No rales GI: Soft. Bowel sounds are normal. She exhibits mild distension. There is no tenderness.  Musculoskeletal: She exhibits no edema. Tenderness to palpation over sternum and xyphoid Neurological: She is alert and oriented to person, place, and time.  LLE>RLE weakness.  3- R to 2 /5  L with HF/KE,  2+/5 PF/DF.  Upper extremities strength5/5 deltoid, bicep, tricep, grip   Skin: Skin is warm and dry.  Psychiatric: more pleasant and up beat.   Assessment/Plan: 1. Functional deficits secondary to thoracic spinal cord infarct causing paraplegia which requires 3+ hours per day of interdisciplinary  therapy in a comprehensive inpatient rehab setting. Physiatrist is providing close team supervision and 24 hour management of active medical problems listed below. Physiatrist and rehab team continue to assess barriers to discharge/monitor patient progress toward functional and medical goals.    pt is still unwilling to consistently engage in therapy  FIM: FIM - Bathing Bathing Steps Patient Completed: Chest;Right Arm;Left Arm;Abdomen;Front perineal area;Right upper leg;Left upper leg;Buttocks;Right lower leg (including foot);Left lower leg (including foot) Bathing: 5: Supervision: Safety issues/verbal cues (using LH sponge)  FIM - Upper Body Dressing/Undressing Upper body dressing/undressing steps patient completed: Thread/unthread right sleeve of front closure shirt/dress;Thread/unthread left sleeve of front closure shirt/dress;Pull shirt around back of front closure shirt/dress Upper body dressing/undressing: 5: Set-up assist to: Obtain clothing/put away FIM - Lower Body Dressing/Undressing Lower body dressing/undressing steps patient completed: Fasten/unfasten pants Lower body dressing/undressing: 1: Total-Patient completed less than 25% of tasks  FIM - Toileting Toileting steps completed by patient: Adjust clothing prior to toileting;Performs perineal hygiene Toileting Assistive Devices: Grab bar or rail for support Toileting: 3: Mod-Patient completed 2 of 3 steps (supine)  FIM - Diplomatic Services operational officer Devices: Psychiatrist Transfers: 0-Activity did not occur  FIM - Banker Devices: Arm rests;Bed rails Bed/Chair Transfer: 4: Supine > Sit: Min A (steadying Pt. > 75%/lift 1 leg);4: Sit > Supine: Min A (steadying pt. > 75%/lift 1 leg)  FIM - Locomotion: Wheelchair Distance: 300 Locomotion: Wheelchair: 5: Travels 150 ft or more: maneuvers on rugs and over  door sills with supervision, cueing or coaxing FIM -  Locomotion: Ambulation Locomotion: Ambulation: 0: Activity did not occur  Comprehension Comprehension Mode: Auditory Comprehension: 5-Understands basic 90% of the time/requires cueing < 10% of the time  Expression Expression Mode: Verbal Expression: 5-Expresses basic 90% of the time/requires cueing < 10% of the time.  Social Interaction Social Interaction: 5-Interacts appropriately 90% of the time - Needs monitoring or encouragement for participation or interaction.  Problem Solving Problem Solving: 5-Solves basic 90% of the time/requires cueing < 10% of the time  Memory Memory: 5-Recognizes or recalls 90% of the time/requires cueing < 10% of the time  Medical Problem List and Plan:  1. 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 DVT Prophylaxis/Anticoagulation: Mechanical: Antiembolism stockings, knee (TED hose) Bilateral lower extremities  Sequential compression devices, below knee Bilateral lower extremitie   -dopplers neg 3. Pain Management: Will increase oxycodone to 15 mg and discontinue prn dilaudid.  4. Mood: LCSW to follow for evaluation and support.  5. Neuropsych: This patient is capable of making decisions on her own behalf.   -xanax prn for anxiety.  -zoloft will likely need to be increased  -+ reinforcement by team---see above 6. Skin/Wound Care: Turn patient every 2 hours. Pressure relief measures. Maintain adequate hydration and nutritional status.  7. Hyponatremia: 134  8. Acute renal failure: push po fluids. Monitor for orthostasis. Labs holding steady  -replace k+ 9. Constipation: +BM recently 10. CV/HTN--EKG normal  -working on BP mgt  --has hx of chest pain due to hematoma,effusions prior to transfer to rehab, likely musculoskeletal component also.   -rx pain symptomatically as well  -appreciate cards assist  LOS (Days) 14 A FACE TO  FACE EVALUATION WAS PERFORMED  Willard Madrigal T 07/22/2014 8:21 AM

## 2014-07-23 ENCOUNTER — Inpatient Hospital Stay (HOSPITAL_COMMUNITY): Payer: Self-pay

## 2014-07-23 ENCOUNTER — Ambulatory Visit (HOSPITAL_COMMUNITY): Payer: Self-pay | Admitting: *Deleted

## 2014-07-23 ENCOUNTER — Encounter (HOSPITAL_COMMUNITY): Payer: Self-pay

## 2014-07-23 ENCOUNTER — Ambulatory Visit (HOSPITAL_COMMUNITY): Payer: Self-pay

## 2014-07-23 DIAGNOSIS — I10 Essential (primary) hypertension: Secondary | ICD-10-CM

## 2014-07-23 DIAGNOSIS — G9519 Other vascular myelopathies: Secondary | ICD-10-CM

## 2014-07-23 DIAGNOSIS — G9782 Other postprocedural complications and disorders of nervous system: Secondary | ICD-10-CM

## 2014-07-23 DIAGNOSIS — F1410 Cocaine abuse, uncomplicated: Secondary | ICD-10-CM

## 2014-07-23 MED ORDER — SERTRALINE HCL 100 MG PO TABS
100.0000 mg | ORAL_TABLET | Freq: Every day | ORAL | Status: DC
  Administered 2014-07-24 – 2014-07-25 (×2): 100 mg via ORAL
  Filled 2014-07-23 (×3): qty 1

## 2014-07-23 NOTE — Progress Notes (Signed)
Occupational Therapy Session and Weekly Progress Note  Patient Details  Name: Charlotte [name] MRN: [id number] Date of Birth: February 18, 1966  Beginning of progress report period: July 16, 2014 End of progress report period: July 23, 2014  Today's Date: 07/23/2014 OT Individual Time: 1100-1115 OT Individual Time Calculation (min): 15 min    Patient is progressing to meet  6 of 8 long term goals.  Short term goals adjusted to remaining length of stay.  Pt continues to demonstrate waxing and waning levels of energy which limits her participation in therapies needed for optimal discharge to care of daughter.  Patient continues to demonstrate the following deficits: Poor activity tolerance, pain/anxiety,and BLE weakness and therefore will continue to benefit from skilled OT intervention to enhance overall performance with BADL and iADL.  See Patient's Care Plan for progression toward long term goals.  Patient progressing toward long term goals..  Continue plan of care.  Skilled Therapeutic Interventions/Progress Updates:  Patient received supine in bed, asleep, with head covered by sheets.  OT provided maximum verbal cues and attempted arousing patient by altering environment to increase stimulation.  Pt woke but refused treatment despite remotivational counseling offered.   Pt attended to conversation, alert and oriented, and discussed discharge plans as presented however she would not engage in movement-based treatments d/t fatigue.         Therapy Documentation Precautions:  Precautions Precautions: Fall Restrictions Weight Bearing Restrictions: No  General: General OT Amount of Missed Time: 45 Minutes PT Missed Treatment Reason: Patient unwilling to participate  Vital Signs: Therapy Vitals Pulse Rate: 73 BP: 136/76 mmHg  Pain: Pain Assessment Pain Assessment: 0-10 Pain Score: 7  Pain Type: Chronic pain Pain Location: Generalized Pain Orientation: Left;Right Pain Descriptors  / Indicators: Aching Pain Onset: On-going Pain Intervention(s): Rest;Repositioned  ADL: ADL ADL Comments: see FIM  See FIM for current functional status  Therapy/Group: Individual Therapy  Charlotte [name] 07/23/2014, 11:22 AM

## 2014-07-23 NOTE — Progress Notes (Signed)
[facility] [place] PHYSICAL MEDICINE & REHABILITATION     PROGRESS NOTE    Subjective/Complaints: Generally shutting down, mood poor A  review of systems has been performed and if not noted above is otherwise negative.    Objective: Vital Signs: Blood pressure 131/75, pulse 67, temperature 98.4 F (36.9 C), temperature source Oral, resp. rate 20, height  (1.651 m), weight 65.772 kg (145 lb), SpO2 98.00%. No results found. No results found for this basename: WBC, HGB, HCT, PLT,  in the last 72 hours  Recent Labs  07/22/14 0600  NA 136*  K 3.2*  CL 98  GLUCOSE 98  BUN 21  CREATININE 1.32*  CALCIUM 9.6   CBG (last 3)  No results found for this basename: GLUCAP,  in the last 72 hours  Wt Readings from Last 3 Encounters:  07/17/14 65.772 kg (145 lb)  07/07/14 66.588 kg (146 lb 12.8 oz)  02/09/11 59.648 kg (131 lb 8 oz)    Physical Exam:  Constitutional: [name], place, and time. She appears well-developed and well-nourished.  HENT: oral mucosa moist Head: Normocephalic and atraumatic.  Eyes: Conjunctivae are normal. Pupils are equal, round, and reactive to light.  Neck: Normal range of motion. Neck supple.  Cardiovascular: Regular rhythm and rate  Respiratory: Effort normal and breath sounds normal. No respiratory distress. She has no wheezes. No rales GI: Soft. Bowel sounds are normal. She exhibits mild distension. There is no tenderness.  Musculoskeletal: She exhibits no edema. Tenderness to palpation over sternum and xyphoid Neurological: She is alert and oriented to person, place, and time.  LLE>RLE weakness.  3- R to 2 /5  L with HF/KE,  2+/5 PF/DF.  Upper extremities strength5/5 deltoid, bicep, tricep, grip   Skin: Skin is warm and dry.  Psychiatric: more pleasant and up beat.   Assessment/Plan: 1. Functional deficits secondary to thoracic spinal cord infarct causing paraplegia which requires 3+ hours per day of interdisciplinary therapy in a  comprehensive inpatient rehab setting. Physiatrist is providing close team supervision and 24 hour management of active medical problems listed below. Physiatrist and rehab team continue to assess barriers to discharge/monitor patient progress toward functional and medical goals.  Need to discuss early discharge  FIM: FIM - Bathing Bathing Steps Patient Completed: Chest;Right Arm;Left Arm;Abdomen;Front perineal area;Right upper leg;Left upper leg;Buttocks;Right lower leg (including foot);Left lower leg (including foot) Bathing: 5: Supervision: Safety issues/verbal cues (using LH sponge)  FIM - Upper Body Dressing/Undressing Upper body dressing/undressing steps patient completed: Thread/unthread right sleeve of front closure shirt/dress;Thread/unthread left sleeve of front closure shirt/dress;Pull shirt around back of front closure shirt/dress Upper body dressing/undressing: 5: Set-up assist to: Obtain clothing/put away FIM - Lower Body Dressing/Undressing Lower body dressing/undressing steps patient completed: Fasten/unfasten pants Lower body dressing/undressing: 1: Total-Patient completed less than 25% of tasks  FIM - Toileting Toileting steps completed by patient: Adjust clothing prior to toileting;Performs perineal hygiene Toileting Assistive Devices: Grab bar or rail for support Toileting: 3: Mod-Patient completed 2 of 3 steps (supine)  FIM - Diplomatic Services operational officer Devices: Psychiatrist Transfers: 0-Activity did not occur  FIM - Banker Devices: Arm rests;Bed rails Bed/Chair Transfer: 5: Supine > Sit: Supervision (verbal cues/safety issues);4: Sit > Supine: Min A (steadying pt. > 75%/lift 1 leg);4: Bed > Chair or W/C: Min A (steadying Pt. > 75%);4: Chair or W/C > Bed: Min A (steadying Pt. > 75%)  FIM - Locomotion: Wheelchair Distance: 300 Locomotion: Wheelchair:  5: Travels 150 ft or more: maneuvers on rugs and  over door sills with supervision, cueing or coaxing FIM - Locomotion: Ambulation Locomotion: Ambulation: 0: Activity did not occur  Comprehension Comprehension Mode: Auditory Comprehension: 5-Understands basic 90% of the time/requires cueing < 10% of the time  Expression Expression Mode: Verbal Expression: 5-Expresses basic 90% of the time/requires cueing < 10% of the time.  Social Interaction Social Interaction: 5-Interacts appropriately 90% of the time - Needs monitoring or encouragement for participation or interaction.  Problem Solving Problem Solving: 5-Solves basic 90% of the time/requires cueing < 10% of the time  Memory Memory: 5-Recognizes or recalls 90% of the time/requires cueing < 10% of the time  Medical Problem List and Plan:  1. 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 DVT Prophylaxis/Anticoagulation: Mechanical: Antiembolism stockings, knee (TED hose) Bilateral lower extremities  Sequential compression devices, below knee Bilateral lower extremitie   -dopplers neg 3. Pain Management: Will increase oxycodone to 15 mg and discontinue prn dilaudid.  4. Mood: LCSW to follow for evaluation and support.  5. Neuropsych: This patient is capable of making decisions on her own behalf.   -xanax prn for anxiety.  -zoloft---increase to   -+ reinforcement by team---see above 6. Skin/Wound Care: Turn patient every 2 hours. Pressure relief measures. Maintain adequate hydration and nutritional status.  7. Hyponatremia: 134  8. Acute renal failure: push po fluids. Monitor for orthostasis. Labs holding steady  -replace k+ 9. Constipation: +BM recently 10. CV/HTN--EKG normal  -working on BP mgt  -rx pain symptomatically as well  -appreciate cards assist  LOS (Days) 15 A FACE TO FACE EVALUATION WAS PERFORMED  SWARTZ,ZACHARY T 07/23/2014 8:12 AM

## 2014-07-23 NOTE — Progress Notes (Signed)
Physical Therapy Note  Patient Details  Name: Charlotte [name] MRN: [id number] Date of Birth: 1966-09-25 Today's Date: 07/23/2014    Pt supine in bed upon arrival, easily aroused but fatigued/drowsy. Pt refused to participate with therapy due to fatigue, asked to let her rest. Therapist provided max encouragement but pt continued to refuse, insisted she would participate tomorrow. Therapist set up lunch tray for pt and moved bed to upright position for activity tolerance. Pt missed 60 minutes of scheduled therapy time.  Hosie Spangle 07/23/2014, 2:03 PM

## 2014-07-23 NOTE — Progress Notes (Signed)
Physical Therapy Session Note  Patient Details  Name: Charlotte [name] MRN: [id number] Date of Birth: [birth year]  Today's Date: 07/23/2014 PT Individual Time: 0835-0900 PT Individual Time Calculation (min): 25 min  and Today's Date: 07/23/2014 PT Missed Time: 35 Minutes Missed Time Reason: Patient unwilling to participate   Short Term Goals: Week 2:  PT Short Term Goal 1 (Week 2): Pt will tolerate 60 minutes of therapeutic activity PT Short Term Goal 2 (Week 2): Pt will consistently transfer bed<>w/c w/ MinA PT Short Term Goal 3 (Week 2): Pt will perform all bed mobility w/ S w/ use of AE PT Short Term Goal 4 (Week 2): Pt will tolerate and participate in 180 minutes of therapy per day  Skilled Therapeutic Interventions/Progress Updates:    Session 1: On scheduled therapy time pt asleep in bed. Easily aroused, but very irritable and requesting time to eat breakfast before participating in therapy. Per NT, pt had received breakfast tray early but refused to eat when tray was delivered. Therapist assisted pt with setting up coffee and cereal. After eating, pt reported incontinence of bladder and requested female staff member to assist w/ cleanup. NT assisted pt w/ cleanup and transferring into w/c (PT not present for transfer). Pt missed 35 minutes of scheduled PT time. Remainder of session focused on w/c propulsion/management and car transfers. Pt propelled w/c 200' to ortho gym w/ S. Pt able to setup w/c for car transfer w/ MinA for w/c placement and MinA for w/c parts management (pt able to lock brakes and flip back arm rest, required assist for removing leg rests. Pt performed scoot transfer in/out of car 2x. Therapist believes pt would benefit from use of sliding board for car transfer, but do not believe pt is likely to adhere to use of SB after discharge. Pt required long rest breaks between each transfer 2/2 decreased activity tolerance. Pt propelled w/c 200' back to room, agreed to stay up in w/c  until OT session at 11. Pt left seated in w/c w/ all needs within reach.   Therapy Documentation Precautions:  Precautions Precautions: Fall Restrictions Weight Bearing Restrictions: No General:   Vital Signs: Therapy Vitals Temp: 98.4 F (36.9 C) Temp src: Oral (Simultaneous filing. User may not have seen previous data.) Pulse Rate: 67 (Simultaneous filing. User may not have seen previous data.) Resp: 20 (Simultaneous filing. User may not have seen previous data.) BP: 131/75 mmHg Patient Position (if appropriate): Lying (Simultaneous filing. User may not have seen previous data.) Oxygen Therapy SpO2: 98 % (Simultaneous filing. User may not have seen previous data.) O2 Device: None (Room air) (Simultaneous filing. User may not have seen previous data.) Pain: Pain Assessment Pain Assessment: 0-10 Pain Score: 7  Pain Type: Chronic pain Pain Location: Leg Pain Orientation: Left;Right Pain Descriptors / Indicators: Aching Mobility:   Locomotion :    Trunk/Postural Assessment :    Balance:   Exercises:   Other Treatments:    See FIM for current functional status  Therapy/Group: Individual Therapy  Hosie Spangle 07/23/2014, 7:54 AM

## 2014-07-24 ENCOUNTER — Inpatient Hospital Stay (HOSPITAL_COMMUNITY): Payer: Medicaid Other | Admitting: Occupational Therapy

## 2014-07-24 ENCOUNTER — Ambulatory Visit (HOSPITAL_COMMUNITY): Payer: Self-pay

## 2014-07-24 ENCOUNTER — Inpatient Hospital Stay (HOSPITAL_COMMUNITY): Payer: Self-pay | Admitting: Physical Therapy

## 2014-07-24 DIAGNOSIS — G9782 Other postprocedural complications and disorders of nervous system: Secondary | ICD-10-CM

## 2014-07-24 DIAGNOSIS — I10 Essential (primary) hypertension: Secondary | ICD-10-CM

## 2014-07-24 DIAGNOSIS — G9519 Other vascular myelopathies: Secondary | ICD-10-CM

## 2014-07-24 DIAGNOSIS — F1410 Cocaine abuse, uncomplicated: Secondary | ICD-10-CM

## 2014-07-24 LAB — BASIC METABOLIC PANEL
Anion gap: 13 (ref 5–15)
BUN: 21 mg/dL (ref 6–23)
CALCIUM: 9.6 mg/dL (ref 8.4–10.5)
CO2: 26 mEq/L (ref 19–32)
Chloride: 98 mEq/L (ref 96–112)
Creatinine, Ser: 1.33 mg/dL — ABNORMAL HIGH (ref 0.50–1.10)
GFR calc Af Amer: 53 mL/min — ABNORMAL LOW (ref >90)
GFR, EST NON AFRICAN AMERICAN: 46 mL/min — AB (ref >90)
GLUCOSE: 110 mg/dL — AB (ref 70–99)
Potassium: 3.6 mEq/L — ABNORMAL LOW (ref 3.7–5.3)
Sodium: 137 mEq/L (ref 137–147)

## 2014-07-24 MED ORDER — POTASSIUM CHLORIDE CRYS ER 20 MEQ PO TBCR
20.0000 meq | EXTENDED_RELEASE_TABLET | Freq: Two times a day (BID) | ORAL | Status: DC
  Administered 2014-07-24 – 2014-07-25 (×2): 20 meq via ORAL
  Filled 2014-07-24 (×4): qty 1

## 2014-07-24 NOTE — Progress Notes (Signed)
Occupational Therapy Session Note  Patient Details  Name: Charlotte [name] MRN: [id number] Date of Birth: [birth year]  Today's Date: 07/24/2014 OT Individual Time: 0800-0902 OT Individual Time Calculation (min): 62 min   Short Term Goals: Week 3:  OT Short Term Goal 1 (Week 3): STG=LTG due to short LOS  Skilled Therapeutic Interventions/Progress Updates:  Patient sleeping in bed upon arrival.  Engaged in shower, dress and groom tasks.  Focused session on activity tolerance, adaptive techniques, transitional movements, AE and safe transfers.  Patient did not like using reacher for LB dressing yet with practice she was able to perform task of donning pants with less assistance.  Patient stating that her daughter and family will be doing all of this for her at discharge to include pulling her pants up and down while she leaned or lifted her bottom.  Patient wanted to get back in bed to don diaper however was agreeable to learning lateral leans and lifting while this OT placed the diaper.  Therapy Documentation Precautions:  Precautions Precautions: Fall Restrictions Weight Bearing Restrictions: No Pain: Denies pain, "already had some pain medicine this morning" ADL: See FIM for current functional status  Therapy/Group: Individual Therapy  Jaren Vanetten 07/24/2014, 8:07 AM

## 2014-07-24 NOTE — Progress Notes (Signed)
[name] PHYSICAL MEDICINE & REHABILITATION     PROGRESS NOTE    Subjective/Complaints: No issues, little participation, pain issues A  review of systems has been performed and if not noted above is otherwise negative.    Objective: Vital Signs: Blood pressure 149/76, pulse 67, temperature 98.6 F (37 C), temperature source Oral, resp. rate 20, height  (1.651 m), weight 65.772 kg (145 lb), SpO2 94.00%. No results found. No results found for this basename: WBC, HGB, HCT, PLT,  in the last 72 hours  Recent Labs  07/22/14 0600  NA 136*  K 3.2*  CL 98  GLUCOSE 98  BUN 21  CREATININE 1.32*  CALCIUM 9.6   CBG (last 3)  No results found for this basename: GLUCAP,  in the last 72 hours  Wt Readings from Last 3 Encounters:  07/17/14 65.772 kg (145 lb)  07/07/14 66.588 kg (146 lb 12.8 oz)  02/09/11 59.648 kg (131 lb 8 oz)    Physical Exam:  Constitutional: [name], place, and time. She appears well-developed and well-nourished.  HENT: oral mucosa moist Head: Normocephalic and atraumatic.  Eyes: Conjunctivae are normal. Pupils are equal, round, and reactive to light.  Neck: Normal range of motion. Neck supple.  Cardiovascular: Regular rhythm and rate  Respiratory: Effort normal and breath sounds normal. No respiratory distress. She has no wheezes. No rales GI: Soft. Bowel sounds are normal. She exhibits mild distension. There is no tenderness.  Musculoskeletal: She exhibits no edema. Tenderness to palpation over sternum and xyphoid Neurological: She is alert and oriented to person, place, and time.  LLE>RLE weakness.  3- R to 2 /5  L with HF/KE,  2+/5 PF/DF.  Upper extremities strength5/5 deltoid, bicep, tricep, grip   Skin: Skin is warm and dry.  Psychiatric: more pleasant and up beat.   Assessment/Plan: 1. Functional deficits secondary to thoracic spinal cord infarct causing paraplegia which requires 3+ hours per day of interdisciplinary  therapy in a comprehensive inpatient rehab setting. Physiatrist is providing close team supervision and 24 hour management of active medical problems listed below. Physiatrist and rehab team continue to assess barriers to discharge/monitor patient progress toward functional and medical goals.  Discuss prognosis and participation with the patient. Challenged her to do more for herself.  FIM: FIM - Bathing Bathing Steps Patient Completed: Chest;Right Arm;Left Arm;Abdomen;Front perineal area;Right upper leg;Left upper leg;Buttocks;Right lower leg (including foot);Left lower leg (including foot) Bathing: 5: Supervision: Safety issues/verbal cues (using LH sponge)  FIM - Upper Body Dressing/Undressing Upper body dressing/undressing steps patient completed: Thread/unthread right sleeve of front closure shirt/dress;Thread/unthread left sleeve of front closure shirt/dress;Pull shirt around back of front closure shirt/dress Upper body dressing/undressing: 5: Set-up assist to: Obtain clothing/put away FIM - Lower Body Dressing/Undressing Lower body dressing/undressing steps patient completed: Fasten/unfasten pants Lower body dressing/undressing: 1: Total-Patient completed less than 25% of tasks  FIM - Toileting Toileting steps completed by patient: Adjust clothing prior to toileting;Performs perineal hygiene Toileting Assistive Devices: Grab bar or rail for support Toileting: 3: Mod-Patient completed 2 of 3 steps (supine)  FIM - Diplomatic Services operational officer Devices: Psychiatrist Transfers: 0-Activity did not occur  FIM - Banker Devices: Arm rests;Bed rails Bed/Chair Transfer: 0: Activity did not occur  FIM - Locomotion: Wheelchair Distance: 300 Locomotion: Wheelchair: 5: Travels 150 ft or more: maneuvers on rugs and over door sills with supervision, cueing or coaxing FIM - Locomotion: Ambulation Locomotion: Ambulation: 0:  Activity did not occur  Comprehension Comprehension Mode: Auditory Comprehension: 5-Understands basic 90% of the time/requires cueing < 10% of the time  Expression Expression Mode: Verbal Expression: 5-Expresses basic 90% of the time/requires cueing < 10% of the time.  Social Interaction Social Interaction: 5-Interacts appropriately 90% of the time - Needs monitoring or encouragement for participation or interaction.  Problem Solving Problem Solving: 5-Solves basic 90% of the time/requires cueing < 10% of the time  Memory Memory: 5-Recognizes or recalls 90% of the time/requires cueing < 10% of the time  Medical Problem List and Plan:  1. 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 DVT Prophylaxis/Anticoagulation: Mechanical: Antiembolism stockings, knee (TED hose) Bilateral lower extremities  Sequential compression devices, below knee Bilateral lower extremitie   -dopplers neg 3. Pain Management: Will increase oxycodone to 15 mg and discontinue prn dilaudid.  4. Mood: LCSW to follow for evaluation and support.  5. Neuropsych: This patient is capable of making decisions on her own behalf.   -xanax prn for anxiety.  -zoloft---increase to   -+ reinforcement by team---see above 6. Skin/Wound Care: Turn patient every 2 hours. Pressure relief measures. Maintain adequate hydration and nutritional status.  7. Hyponatremia: 134  8. Acute renal failure: push po fluids. Monitor for orthostasis. Labs holding steady  -replace k+ 9. Constipation: +BM recently 10. CV/HTN--EKG normal  -working on BP mgt  -rx pain symptomatically as well  -appreciate cards assist  LOS (Days) 16 A FACE TO FACE EVALUATION WAS PERFORMED  SWARTZ,ZACHARY T 07/24/2014 8:33 AM

## 2014-07-24 NOTE — Patient Care Conference (Signed)
Inpatient RehabilitationTeam Conference and Plan of Care Update Date: 07/23/2014   Time: 2:30 PM    Patient Name: [name]      Medical Record Number: [id number]  Date of Birth: [birth year] Sex: Female         Room/Bed: 4W18C/4W18C-01 Payor Info: Payor: MEDICAID PENDING / Plan: MEDICAID PENDING / Product Type: *No Product type* /    Admitting Diagnosis: Spinal cord infarct  Admit Date/Time:  07/08/2014  4:54 PM Admission Comments: No comment available   Primary Diagnosis:  <principal problem not specified> Principal Problem: <principal problem not specified>  Patient Active Problem List   Diagnosis Date Noted  . Chronic renal insufficiency, stage III (moderate) 07/19/2014  . Dyslipidemia 07/19/2014  . 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 Polycystic kidney disease 06/29/2014  . 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 ANEMIA 02/09/2011  . TOBACCO ABUSE 02/09/2011  . COCAINE ABUSE 02/09/2011  . HTN (hypertension), malignant 02/09/2011  . NAUSEA 02/09/2011    Expected Discharge Date: Expected Discharge Date: 07/25/14  Team Members Present: Physician leading conference: Dr. Faith Rogue Social Worker Present: Amada Jupiter, LCSW Nurse Present: Carlean Purl, RN PT Present: Hosie Spangle, PT OT Present: Donzetta Kohut, OT;Jennifer Katrinka Blazing, OT SLP Present: Fae Pippin, SLP PPS Coordinator present : Tora Duck, RN, CRRN     Current Status/Progress Goal Weekly Team Focus  Medical   depression, not progressing  finalize dc planning  family ed, dc planning   Bowel/Bladder   often incontinent of bowel and bladder LBM 07/22/14 sfter 45ml sorbitol, refuses daily suppository/bowel program  decrease incontinence episodes  offer toileting q2h while awake   Swallow/Nutrition/ Hydration             ADL's   Supervision for upper body BADL, Max-Mod assist for lower body dressing, min assist (steadying) with transfers   Supervision for upper body B&D and  transfers, Mod Assist for lower body dressing  Improved endurance, pain managment, AE training, family education   Mobility   MinA-S for scoot transfers, mod (I) w/ w/c propulsion, MinA for w/c parts management, S for bed mobility  mod I w/c management; S' transfers  Participation, activity tolerance, BLE strengthening   Communication             Safety/Cognition/ Behavioral Observations            Pain   lower back and BLE pain, Oxy 10-15mg  q4h, Ultram  q6h, Robaxin   q6h, patient request all  pain less than or equal to 4 on a scale of 0-10  assess pain q shift and prn   Skin   surgical puncture site to lower back OTA, skin on   no new skin breakdown/infection  assess skin q shift and prn    Rehab Goals Patient on target to meet rehab goals: Yes *See Care Plan and progress notes for long and short-term goals.  Barriers to Discharge: mood/engagement    Possible Resolutions to Barriers:  see prior    Discharge Planning/Teaching Needs:  Plans to return home with her daughter, [name], who is available to provide 24/7 assistance      Team Discussion:  Continue with poor participation and making no gains in tx.  Recommend completing family ed tomorrow and d/c earlier (9/3).  SW will arrange fam ed.    Revisions to Treatment Plan:  Downgrading of all goals and d/c one day earlier due to lack of participation from pt.  Continued Need for Acute Rehabilitation Level of Care: The patient requires daily medical management by a physician with specialized training in physical medicine and rehabilitation for the following conditions: Daily direction of a multidisciplinary physical rehabilitation program to ensure safe treatment while eliciting the highest outcome that is of practical value to the patient.: Yes Daily medical management of patient stability for increased activity during participation in an intensive rehabilitation regime.: Yes Daily  analysis of laboratory values and/or radiology reports with any subsequent need for medication adjustment of medical intervention for : Neurological problems;Post surgical problems  [name] 07/24/2014, 6:28 AM

## 2014-07-24 NOTE — Plan of Care (Signed)
Problem: RH Wheelchair Mobility Goal: LTG Patient will propel w/c in controlled environment (PT) LTG: Patient will propel wheelchair in controlled environment, # of feet with assist (PT)  Outcome: Completed/Met Date Met:  07/24/14 Pt requires assist for managing WC parts, mod (I) for propulsion only Goal: LTG Patient will propel w/c in home environment (PT) LTG: Patient will propel wheelchair in home environment, # of feet with assistance (PT).  Outcome: Not Met (add Reason) Pt requires assist to negotiate up/down ramped entrance Goal: LTG Patient will propel w/c in community environment (PT) LTG: Patient will propel wheelchair in community environment, # of feet with assist (PT)  Outcome: Not Met (add Reason) Pt requires assist to negotiate up/down uneven surfaces/sidewalks

## 2014-07-24 NOTE — Progress Notes (Signed)
Social Work Patient ID: [name], female   DOB: 04-Sep-1966, 48 y.o.   MRN: [id number]   Spoke with pt's daughter yesterday afternoon to review team conference.  Pt and daughter aware and agreeable with changed in d/c date to 9/3 with mod assist w/c goals.  Plan to complete family ed with PT this afternoon and DME for home delivery today as well.  Will assist with other needed referrals and d/c tomorrow.  Traye Bates, LCSW

## 2014-07-24 NOTE — Progress Notes (Signed)
Physical Therapy Session Note  Patient Details  Name: [name] MRN: [id number] Date of Birth: 09/09/1966  Today's Date: 07/24/2014 PT Individual Time: 1000-1100 PT Individual Time Calculation (min): 60 min   Short Term Goals: Week 2:  PT Short Term Goal 1 (Week 2): Pt will tolerate 60 minutes of therapeutic activity PT Short Term Goal 2 (Week 2): Pt will consistently transfer bed<>w/c w/ MinA PT Short Term Goal 3 (Week 2): Pt will perform all bed mobility w/ S w/ use of AE PT Short Term Goal 4 (Week 2): Pt will tolerate and participate in 180 minutes of therapy per day  Skilled Therapeutic Interventions/Progress Updates:    Therapeutic Activity: PT instructs pt in bed mobility supine to sit with SBA without bedrail utilizing a partial roll and partial long sit method.  PT instructs pt in scoot transfer bed to w/c to R with PT stabilizing w/c for safety; pt req verbal cues for foot placement req SBA.  PT instructs pt in car transfer req CGA on way into the car and min A on way out of the car via scoot/squat-pivot transfer.  PT instructs pt in scoot transfer w/c to bed to L with PT stabilizing w/c for safety and verbal cues for foot placement req SBA.  PT instructs pt to use leg lifter to get R LE into bed and pt is able to do so with verbal cues for technique, only. Pt req physical assist for L LE with leg lifter, so total min A for sit to supine transfer in bed.   W/C Management: PT instructs pt in how to don B legrests req verbal cues for technique and manual assist.  PT instructs pt in w/c propulsion with B UE x 200' mod I.  PT instructs pt in w/c propulsion up/down a ramp with B UE and verbal cues for technique req SBA.   Community Reintegration: PT instructs pt in w/c propulsion with B UEs in the community x 400' req SBA and verbal cues to control her w/c when heading down a ramp or uneven decline.   Pt presents with improved participation and less resistance to therapy, today,  likely because d/c date is quickly approaching. Pt will benefit from continued work on bed mobility, specifically sit to supine and use of leg lifter, as well as upright tolerance/activity tolerance and generalized B LE strengthening.   Therapy Documentation Precautions:  Precautions Precautions: Fall Restrictions Weight Bearing Restrictions: No Vital Signs: Therapy Vitals Pulse Rate: 57 BP: 124/70 mmHg Patient Position (if appropriate): Lying Oxygen Therapy SpO2: 98 % O2 Device: None (Room air) Pain: Pain Assessment Pain Assessment: 0-10 Pain Score: 7  Pain Location: Leg Pain Orientation: Left;Right Pain Descriptors / Indicators: Aching Pain Onset: On-going Pain Intervention(s): Rest;Repositioned Multiple Pain Sites: No  See FIM for current functional status  Therapy/Group: Individual Therapy  Angas Isabell M 07/24/2014, 10:04 AM

## 2014-07-24 NOTE — Plan of Care (Signed)
Problem: RH Bed Mobility Goal: LTG Patient will perform bed mobility with assist (PT) LTG: Patient will perform bed mobility with assistance, with/without cues (PT).  Outcome: Not Met (add Reason) Requires assist to manage BLE in/out of bed

## 2014-07-24 NOTE — Progress Notes (Signed)
Physical Therapy Discharge and Session Summary  Patient Details  Name: [name] MRN: [id number] Date of Birth: 1966-06-17  Today's Date: 07/24/2014 PT Individual Time: 1400-1440 PT Individual Time Calculation (min): 40 min    Patient has met 4 of 10 long term goals due to improved activity tolerance, improved postural control, decreased pain and ability to compensate for deficits.  Patient to discharge at a wheelchair level Supervision for transfers, w/c parts management, and MinGuard for car transfers.   Patient's care partner is independent to provide the necessary physical assistance at discharge.  Reasons goals not met: Participation limited by fatigue/pain/mood. Limited motor return in BLE during rehab stay  Recommendation:  Patient will benefit from ongoing skilled PT services in home health setting to continue to advance safe functional mobility, address ongoing impairments in functional mobility, and minimize fall risk.  Equipment: Wheelchair, sliding board for car transfers  Reasons for discharge: discharge from hospital  Patient/family agrees with progress made and goals achieved: Yes  Skilled Therapeutic Interventions Pt received supine in bed w/ daughter present. Discussion on home environment and mobility w/ daughter. Daughter demonstrated ability to assist pt w/ scoot transfers w/c<>bed, 2x car<>w/c transfer w/ and w/o sliding board, and negotiating up/down 1 curb step safely. Per daughter there is one step to a large porch and then one step into house. Pt will have 24 hr A from daughter and family at discharge.  PT Discharge Precautions/Restrictions Precautions Precautions: Fall Restrictions Weight Bearing Restrictions: No Vital Signs   Pain Pain Assessment Pain Assessment: 0-10 Pain Score: 6  Pain Type: Chronic pain Pain Location: Leg Pain Orientation: Right;Left Pain Descriptors / Indicators: Aching Pain Onset: On-going Pain Intervention(s): Medication  (See eMAR) Vision/Perception     Cognition Overall Cognitive Status: Within Functional Limits for tasks assessed Arousal/Alertness: Lethargic Orientation Level: Oriented X4 Sensation Sensation Light Touch: Impaired by gross assessment Additional Comments: Grossly impaired BLE Motor  Motor Motor: Paraplegia Motor - Skilled Clinical Observations: paraparesis, BLE  Mobility Bed Mobility Bed Mobility: Sit to Supine;Supine to Sit Supine to Sit: 5: Supervision;4: Min assist Supine to Sit Details (indicate cue type and reason): Intermittent A for managing BLE OOB Sit to Supine: 5: Supervision;4: Min assist Sit to Supine - Details (indicate cue type and reason): Intermittent A for managing BLE into bed Transfers Transfers: Yes Lateral/Scoot Transfers: 4: Min guard Locomotion  Ambulation Ambulation: No Gait Gait: No Stairs / Additional Locomotion Stairs: No (Pt's daughter demonstrated ability to manage w/c up 1 step) Product manager Mobility: Yes Wheelchair Assistance: 5: Careers information officer: Both upper extremities Distance: 200  Trunk/Postural Assessment  Cervical Assessment Cervical Assessment: Within Functional Limits (Forward head) Thoracic Assessment Thoracic Assessment: Within Functional Limits Lumbar Assessment Lumbar Assessment: Within Functional Limits Postural Control Postural Control: Within Functional Limits  Balance Balance Balance Assessed: No Extremity Assessment  RUE Assessment RUE Assessment: Within Functional Limits LUE Assessment LUE Assessment: Within Functional Limits RLE Strength RLE Overall Strength Comments: Limited by pain and ?effort, grossly 3/5 LLE Strength LLE Overall Strength Comments: Limited by pain and ?effort, grossly 2+/5  See FIM for current functional status  Rada Hay 07/24/2014, 2:56 PM

## 2014-07-25 DIAGNOSIS — N390 Urinary tract infection, site not specified: Secondary | ICD-10-CM | POA: Diagnosis present

## 2014-07-25 MED ORDER — NICOTINE 14 MG/24HR TD PT24: 14.0000 mg | MEDICATED_PATCH | Freq: Every day | TRANSDERMAL | Status: DC

## 2014-07-25 MED ORDER — POLYETHYLENE GLYCOL 3350 17 G PO PACK: 17.0000 g | PACK | Freq: Every day | ORAL | Status: DC

## 2014-07-25 MED ORDER — GABAPENTIN 100 MG PO CAPS: 200.0000 mg | ORAL_CAPSULE | Freq: Three times a day (TID) | ORAL | Status: DC

## 2014-07-25 MED ORDER — HYDRALAZINE HCL 100 MG PO TABS: 100.0000 mg | ORAL_TABLET | Freq: Three times a day (TID) | ORAL | Status: DC

## 2014-07-25 MED ORDER — HYDROCHLOROTHIAZIDE 12.5 MG PO CAPS: 12.5000 mg | ORAL_CAPSULE | Freq: Every day | ORAL | Status: DC

## 2014-07-25 MED ORDER — SERTRALINE HCL 100 MG PO TABS: 100.0000 mg | ORAL_TABLET | Freq: Every day | ORAL | Status: DC

## 2014-07-25 MED ORDER — ATORVASTATIN CALCIUM 20 MG PO TABS: 20.0000 mg | ORAL_TABLET | Freq: Every day | ORAL | Status: DC

## 2014-07-25 MED ORDER — ACETAMINOPHEN 325 MG PO TABS: 325.0000 mg | ORAL_TABLET | ORAL | Status: DC | PRN

## 2014-07-25 MED ORDER — METHOCARBAMOL 500 MG PO TABS: 500.0000 mg | ORAL_TABLET | Freq: Four times a day (QID) | ORAL | Status: DC | PRN

## 2014-07-25 MED ORDER — FAMOTIDINE 20 MG PO TABS: 20.0000 mg | ORAL_TABLET | Freq: Two times a day (BID) | ORAL | Status: DC

## 2014-07-25 MED ORDER — LABETALOL HCL 300 MG PO TABS: 300.0000 mg | ORAL_TABLET | Freq: Two times a day (BID) | ORAL | Status: DC

## 2014-07-25 MED ORDER — ALPRAZOLAM 0.25 MG PO TABS: 0.2500 mg | ORAL_TABLET | Freq: Three times a day (TID) | ORAL | Status: DC | PRN

## 2014-07-25 MED ORDER — POTASSIUM CHLORIDE CRYS ER 20 MEQ PO TBCR: 20.0000 meq | EXTENDED_RELEASE_TABLET | Freq: Two times a day (BID) | ORAL | Status: DC

## 2014-07-25 MED ORDER — BISACODYL 10 MG RE SUPP: 10.0000 mg | Freq: Every day | RECTAL | Status: DC

## 2014-07-25 MED ORDER — TRAMADOL HCL 50 MG PO TABS: 50.0000 mg | ORAL_TABLET | Freq: Four times a day (QID) | ORAL | Status: DC | PRN

## 2014-07-25 MED ORDER — OXYCODONE HCL 10 MG PO TABS: 10.0000 mg | ORAL_TABLET | Freq: Four times a day (QID) | ORAL | Status: DC | PRN

## 2014-07-25 NOTE — Discharge Instructions (Signed)
°  Inpatient Rehab Discharge Instructions   [name] Discharge date and time: 07/25/14   Activities/Precautions/ Functional Status: Activity: activity as tolerated with assistance Diet: low fat, low cholesterol diet Wound Care: none needed   Functional status:  ___ No restrictions     ___ Walk up stepsndependently _X__ 24/7 supervision/assistance   ___ Walk up steps with assistance ___ Intermittent supervision/assistance  ___ Bathe/dress independently ___ Walk with walker     ___ Bathe/dress with assistance ___ Walk Independently    ___ Shower independently ___ Walk with assistance    ___ Shower with assistance _X__ No alcohol     ___ Return to work/school ________     COMMUNITY REFERRALS UPON DISCHARGE:    Home Health:       RN        SW                        Agency: [facility] Phone: [phone]   Medical Equipment/Items Ordered: wheelchair, cushion, hospital bed, 3n1 commode and tub bench                                                     Agency/Supplier: [facility] @ [phone]   GENERAL COMMUNITY RESOURCES FOR PATIENT/FAMILY:  Support Groups: Online SCI groups (hand-out)  Mental Health: resume services through [employer] of the [place]      Special Instructions: 1. You need to keep up with daily bowel program or are going to get severely constipated and need disimpaction.  2. Take your medications daily as prescribed.    My questions have been answered and I understand these instructions. I will adhere to these goals and the provided educational materials after my discharge from the hospital.  Patient/Caregiver Signature _______________________________ Date __________  Clinician Signature _______________________________________ Date __________  Please bring this form and your medication list with you to all your follow-up doctor's appointments.

## 2014-07-25 NOTE — Discharge Summary (Addendum)
Physician Discharge Summary  Patient ID: Charlotte [name] MRN: [id number] DOB/AGE: 03-13-1966 48 y.o.  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   Significant Diagnostic Studies: 2015 12:27   Dg Abd 1 View  07/08/2014   CLINICAL DATA:  Constipation.  Spinal cord ischemia.  EXAM: ABDOMEN - 1 VIEW  COMPARISON:  CT of the abdomen 07/04/2014  FINDINGS: The bowel gas pattern is normal. No radio-opaque calculi or other significant radiographic abnormality are seen. There is moderate stool burden. Visualized osseous structures have a normal appearance.  IMPRESSION: Moderate stool burden.   Electronically Signed   By: Rosalie Gums M.D.   On: 07/08/2014 18:22     Labs:  Basic Metabolic Panel:  Recent Labs Lab 07/24/14 1000 07/29/14 1622 07/29/14 1639 07/30/14 0640  NA 137 142 140 139  K 3.6* 4.2 4.0 3.8  CL 98 103 108 103  CO2 26 21  --  23  GLUCOSE 110* 71 73 92  BUN 21 28* 26* 23  CREATININE 1.33* 1.55* 1.70* 1.40*  CALCIUM 9.6 10.2  --  9.8  MG  --   --   --  2.0  PHOS  --   --   --  5.2*    CBC: CBC Latest Ref Rng 07/30/2014 07/29/2014 07/29/2014  WBC 4.0 - 10.5 K/uL 4.0 - 5.5  Hemoglobin 12.0 - 15.0 g/dL 11.0(L) 12.6 11.1(L)  Hematocrit 36.0 - 46.0 % 34.3(L) 37.0 34.5(L)  Platelets 150 - 400 K/uL 336 - 382     CBG: No results found for this basename: GLUCAP,  in the last 168 hours  Brief HPI:   Charlotte [name] is a 48 y.o. female with history of HTN, CKD, polysubstance abuse in the past, bipolar disorder; who was admitted on 06/29/14 with sudden onset of severe chest pain with SOB and inability to move  BLE. Patient noted to have A fib at admission. CTA chest showe d B aortic dissection arising just distal to the origin of the left subclavian artery and extending to the aortic bifurcation. 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 Dr. Roseanne Reno recommended spinal drain and she has had improvement in LE strength past placement. Blood pressure control improving. She had recurrent chest pain with SOB on 07/04/14  and CTA chest showed evolving descending thoracic aortic intramural hematoma and development of moderate to large left pleural effusion and small right pleural effusion. Left effusion tapped for 300 cc by IVR with improvement in symptoms. Therapy ongoing with improvement in tolerance at EOB and CIR was recommended for follow up therapies.    Hospital Course: Charlotte [name] was admitted to rehab 07/08/2014 for inpatient therapies to consist of PT, ST and OT at least three hours five days a week. Past admission physiatrist, therapy team and rehab RN have worked together to provide customized collaborative inpatient rehab. Blood pressures were monitored on tid basis and HCTZ was added for better control. Lytes were monitored with routine check and K dur was added due to  hypokalemia.  She has had depressed mood with anxiety with multiple episodes of recurrent chest pain . Zoloft was resumed and titrated to 100 mg to help with mood stabilization. Full work up of chest pain has been done multiple times with EKG, serial cardiac enzymes as well as CXR. Most recent CXR shows improvement with resolution of atelectasis and no fluid overload. Dr.Gerhardt as well as [facility] were consulted for input and recommended BP management but no additional work up indicated as patient with similar symptoms since admission as well as negative  work up so far.   BLE dopplers were negative for DVT. She was started on Neurontin for neuropathic pain due to irritation from  hematoma as well as musculoskeletal pain related to activity.  Oxycodone has also been used on prn basis to help with pain management. Patient's progress has been limited due to poor participation, distrust, anxiety as well as pain issues. Bowel program was attempted but patient has refused scheduled program. She was advised to continue miralax daily with suppository every 24-48 hours to prevent need for disimpaction. She did not meet set goals and is currently at min to moderate assist at wheelchair level.  Follow up home therapies not covered by Medicaid. [facility] set up to provide [facility] as well as SW for follow up past discharge.   Rehab course: During patient's stay in rehab weekly team conferences were held to monitor patient's progress, set goals and discuss barriers to discharge. Patient has had improvement in activity tolerance, balance, postural control, as well as ability to compensate for deficits.  She requires set up assist for UB dressing, supervision for bathing and total assist for LB dressing as disliked using AE for compensation. She requires supervision to min assist with LE for transfers. She is able to propel her wheelchair 200 feet with supervision. Family education was done with daughter regarding all aspects of care as well as wheelchair management.     Disposition:  Home  Diet: Regular  Special Instructions: 1. Continue daily bowel program. 2. Take medications as prescribed.  3. Set follow up appointment with Mental Health past discharge.     Medication List    STOP taking these medications       albuterol (2.5 MG/3ML) 0.083% nebulizer solution  Commonly known as:  PROVENTIL     DSS 100 MG Caps      TAKE these medications       acetaminophen 325 MG tablet  Commonly known as:  TYLENOL  Take 1-2 tablets (325-650 mg total) by  mouth every 4 (four) hours as needed for mild pain.     ALPRAZolam 0.25 MG tablet  Commonly known as:  XANAX  Take 1 tablet (0.25 mg total) by mouth 3 (three) times daily as needed for anxiety.     alum & mag hydroxide-simeth 200-200-20 MG/5ML suspension  Commonly known as:  MAALOX/MYLANTA  Take 15-30 mLs by mouth every 2 (two) hours as needed for indigestion.     atorvastatin 20 MG tablet  Commonly known as:  LIPITOR  Take 1 tablet (20 mg total) by mouth daily at 6 PM.     bisacodyl 10 MG suppository  Commonly known as:  DULCOLAX  Place 1 suppository (10 mg total) rectally daily at 6 (six) AM. For bowel program.     famotidine 20 MG tablet  Commonly known as:  PEPCID  Take 1 tablet (20 mg total) by mouth 2 (two) times daily.     feeding supplement (  ENSURE COMPLETE) Liqd  Take 237 mLs by mouth 3 (three) times daily between meals.     gabapentin 100 MG capsule  Commonly known as:  NEURONTIN  Take 2 capsules (200 mg total) by mouth 3 (three) times daily.     hydrALAZINE 100 MG tablet  Commonly known as:  APRESOLINE  Take 1 tablet (100 mg total) by mouth every 8 (eight) hours.     hydrochlorothiazide 12.5 MG capsule  Commonly known as:  MICROZIDE  Take 1 capsule (12.5 mg total) by mouth daily.     labetalol 300 MG tablet  Commonly known as:  NORMODYNE  Take 1 tablet (300 mg total) by mouth 2 (two) times daily.     methocarbamol 500 MG tablet  Commonly known as:  ROBAXIN  Take 1 tablet (500 mg total) by mouth every 6 (six) hours as needed for muscle spasms.     nicotine 14 mg/24hr patch  Commonly known as:  NICODERM CQ - dosed in mg/24 hours  Place 1 patch (14 mg total) onto the skin daily.     Oxycodone HCl 10 MG Tabs--Rx # 100 pills   Take 1-1.5 tablets (10-15 mg total) by mouth every 6 (six) hours as needed for severe pain.     polyethylene glycol packet  Commonly known as:  MIRALAX / GLYCOLAX  Take 17 g by mouth daily after supper.     potassium chloride SA 20  MEQ tablet  Commonly known as:  K-DUR,KLOR-CON  Take 1 tablet (20 mEq total) by mouth 2 (two) times daily.     sertraline 100 MG tablet  Commonly known as:  ZOLOFT  Take 1 tablet (100 mg total) by mouth daily.     traMADol 50 MG tablet--Rx # 45 pills   Commonly known as:  ULTRAM  Take 1 tablet (50 mg total) by mouth every 6 (six) hours as needed for moderate pain.       Follow-up Information   Follow up with Cassell Clement, MD On 08/07/2014. (Be there at 4 pm)    Specialty:  Cardiology   Contact information:   [address] [place] [address] [phone]       Follow up with Ranelle Oyster, MD On 09/10/2014. (Be there at 10 am  for 10:20 am  appointment )    Specialty:  Physical Medicine and Rehabilitation   Contact information:   [address], [address] [place] [address] [phone]       Follow up with Delight Ovens, MD. Call today. (for follow up on aneurysm. )    Specialty:  Cardiothoracic Surgery   Contact information:   [address] [place] [address] [phone]       Follow up with [facility] On 08/01/2014. (Appointment with Dr. August Saucer at 10:45 am. You need to follow up for post hospital follow up due to medical issues. You need to keep your appointment with your MD. Cancel and reschedule if you cannot make it. )    Contact information:   [address] [place] [address] [phone]       Signed: Jacquelynn Cree 07/30/2014, 8:42 AM

## 2014-07-25 NOTE — Progress Notes (Signed)
Social Work  Discharge Note  The overall goal for the admission was met for:   Discharge location: Yes - home with daughter to provide 24/7 assistance  Length of Stay: Yes - 17 days  Discharge activity level: No - did not meet goals due to poor participation   Home/community participation: No  Services provided included: MD, RD, PT, OT, RN, TR, Pharmacy and [place]: Other: NONE - Medicaid application pending  Follow-up services arranged: Home Health: RN, SW via [place] (note:  tx f/u recommended, however, not covered under medicaid with given diagnosis), DME: 16x18 lightweight w/c, cushion, hospital bed, drop arm commode and tub bench via [place] and Patient/Family has no preference for HH/DME agencies  Comments (or additional information): Enrolled pt into the [facility] program for medication assist x 1 month;  Provided written info on an online SCI support resource and on [employer].  Patient/Family verbalized understanding of follow-up arrangements: Yes  Individual responsible for coordination of the follow-up plan: patient  Confirmed correct DME delivered: Amiylah Anastos 07/25/2014    Eaton Folmar

## 2014-07-25 NOTE — Progress Notes (Signed)
Occupational Therapy Discharge Summary  Patient Details  Name: SPRING SAN MRN: [id number] Date of Birth: 01-28-1966  Today's Date: 07/26/2014   Patient has met 8 of 9 long term goals due to postural control, ability to compensate for deficits and improved awareness.  Patient to discharge at overall Min-Mod Assist level.  Patient's care partner is independent to provide the necessary physical assistance at discharge.    Reasons goals not met: Poor activity tolerance and multiple refusals restricted treatment emphasis preventing attention to meal prep trials.  Recommendation:  Patient will not require skilled OT services in home health setting.  Equipment: Tub bench, BSC  Reasons for discharge: discharge from hospital  Patient/family agrees with progress made and goals achieved: Yes  OT Discharge Precautions/Restrictions  Precautions Precautions: Fall  Pain Pain Assessment Pain Assessment: 0-10 Pain Score: 1  Pain Type: Chronic pain Pain Location: Leg Pain Orientation: Right;Left Pain Descriptors / Indicators: Aching Pain Intervention(s): Medication (See eMAR)  ADL ADL ADL Comments: see FIM  Vision/Perception  Vision- History Baseline Vision/History: No visual deficits Patient Visual Report: No change from baseline Vision- Assessment Vision Assessment?: No apparent visual deficits Perception Comments: WFL   Cognition Overall Cognitive Status: Within Functional Limits for tasks assessed Arousal/Alertness: Awake/alert Orientation Level: Oriented X4 Attention: Alternating Alternating Attention: Appears intact Memory: Appears intact Awareness: Appears intact Problem Solving: Impaired Problem Solving Impairment: Functional complex Safety/Judgment: Appears intact  Sensation Sensation Light Touch: Impaired by gross assessment Stereognosis: Appears Intact Hot/Cold: Appears Intact Proprioception: Appears Intact Additional Comments: Grossly impaired  BLE Coordination Gross Motor Movements are Fluid and Coordinated: Yes (@ BUE; paraparesis @ BLE) Fine Motor Movements are Fluid and Coordinated: Yes (@ BUE, paraparesis @ BLE)  Motor  Motor Motor: Paraplegia Motor - Skilled Clinical Observations: paraparesis, BLE  Mobility  Bed Mobility Bed Mobility: Rolling Right;Rolling Left;Supine to Sit;Sitting - Scoot to [employer] of Bed Rolling Right: 4: Min assist Rolling Right Details: Manual facilitation for placement (able to shift hips to assist with turning in bed @ discharge) Rolling Left: 4: Min assist Rolling Left Details: Manual facilitation for placement (able to shift hips to turn in bed) Supine to Sit: 4: Min assist Supine to Sit Details: Manual facilitation for placement;Verbal cues for technique;Verbal cues for precautions/safety Sitting - Scoot to Edge of Bed: 4: Min assist Sitting - Scoot to [employer] of Bed Details: Manual facilitation for weight shifting Transfers Transfers: Not assessed   Trunk/Postural Assessment  Cervical Assessment Cervical Assessment: Within Functional Limits Thoracic Assessment Thoracic Assessment: Within Functional Limits Lumbar Assessment Lumbar Assessment: Within Functional Limits Postural Control Postural Control: Within Functional Limits   Balance Balance Balance Assessed: No  Extremity/Trunk Assessment RUE Assessment RUE Assessment: Within Functional Limits LUE Assessment LUE Assessment: Within Functional Limits  See FIM for current functional status  Butts 07/26/2014, 2:24 PM

## 2014-07-25 NOTE — Progress Notes (Signed)
[facility]IL[facility]     PROGRESS NOTE    Subjective/Complaints: No changes today.  A  review of systems has been performed and if not noted above is otherwise negative.    Objective: Vital Signs: Blood pressure 134/86, pulse 64, temperature 98 F (36.7 C), temperature source Oral, resp. rate 16, height  (1.651 m), weight 63.64 kg (140 lb 4.8 oz), SpO2 97.00%. No results found. No results found for this basename: WBC, HGB, HCT, PLT,  in the last 72 hours  Recent Labs  07/24/14 1000  NA 137  K 3.6*  CL 98  GLUCOSE 110*  BUN 21  CREATININE 1.33*  CALCIUM 9.6   CBG (last 3)  No results found for this basename: GLUCAP,  in the last 72 hours  Wt Readings from Last 3 Encounters:  07/24/14 63.64 kg (140 lb 4.8 oz)  07/07/14 66.588 kg (146 lb 12.8 oz)  02/09/11 59.648 kg (131 lb 8 oz)    Physical Exam:  Constitutional: [name], place, and time. She appears well-developed and well-nourished.  HENT: oral mucosa moist Head: Normocephalic and atraumatic.  Eyes: Conjunctivae are normal. Pupils are equal, round, and reactive to light.  Neck: Normal range of motion. Neck supple.  Cardiovascular: Regular rhythm and rate  Respiratory: Effort normal and breath sounds normal. No respiratory distress. She has no wheezes. No rales GI: Soft. Bowel sounds are normal. She exhibits mild distension. There is no tenderness.  Musculoskeletal: She exhibits no edema. Tenderness to palpation over sternum and xyphoid Neurological: She is alert and oriented to person, place, and time.  LLE>RLE weakness.  3- R to 2 /5  L with HF/KE,  2+/5 PF/DF.  Upper extremities strength5/5 deltoid, bicep, tricep, grip   Skin: Skin is warm and dry.  Psychiatric: more pleasant and up beat.   Assessment/Plan: 1. Functional deficits secondary to thoracic spinal cord infarct causing paraplegia which requires 3+ hours per day of interdisciplinary therapy in a  comprehensive inpatient rehab setting. Physiatrist is providing close team supervision and 24 hour management of active medical problems listed below. Physiatrist and rehab team continue to assess barriers to discharge/monitor patient progress toward functional and medical goals.  Home today. Neuro/cards/surgery/PM&R follow up  FIM: FIM - Bathing Bathing Steps Patient Completed: Chest;Right Arm;Left Arm;Abdomen;Front perineal area;Right upper leg;Left upper leg;Buttocks;Right lower leg (including foot);Left lower leg (including foot) Bathing: 5: Supervision: Safety issues/verbal cues (lateral leans and LG sponge)  FIM - Upper Body Dressing/Undressing Upper body dressing/undressing steps patient completed: Thread/unthread right bra strap;Thread/unthread left bra strap;Hook/unhook bra;Thread/unthread right sleeve of pullover shirt/dresss;Thread/unthread left sleeve of pullover shirt/dress;Put head through opening of pull over shirt/dress;Pull shirt over trunk Upper body dressing/undressing: 5: Set-up assist to: Obtain clothing/put away FIM - Lower Body Dressing/Undressing Lower body dressing/undressing steps patient completed: Thread/unthread right pants leg (reacher) Lower body dressing/undressing: 1: Total-Patient completed less than 25% of tasks ("I can't do this, my family will do it for me")  FIM - Toileting Toileting steps completed by patient: Adjust clothing prior to toileting;Performs perineal hygiene Toileting Assistive Devices: Grab bar or rail for support Toileting: 3: Mod-Patient completed 2 of 3 steps (supine)  FIM - Diplomatic Services operational officer Devices: Psychiatrist Transfers: 0-Activity did not occur  FIM - Banker Devices: Arm rests Bed/Chair Transfer: 5: Supine > Sit: Supervision (verbal cues/safety issues);4: Sit > Supine: Min A (steadying pt. > 75%/lift 1 leg);5: Bed > Chair or W/C:  Supervision (verbal  cues/safety issues);5: Chair or W/C > Bed: Supervision (verbal cues/safety issues)  FIM - Locomotion: Wheelchair Distance: 200 Locomotion: Wheelchair: 5: Travels 150 ft or more: maneuvers on rugs and over door sills with supervision, cueing or coaxing FIM - Locomotion: Ambulation Locomotion: Ambulation: 0: Activity did not occur  Comprehension Comprehension Mode: Auditory Comprehension: 5-Understands complex 90% of the time/Cues < 10% of the time  Expression Expression Mode: Verbal Expression: 5-Expresses complex 90% of the time/cues < 10% of the time  Social Interaction Social Interaction: 5-Interacts appropriately 90% of the time - Needs monitoring or encouragement for participation or interaction.  Problem Solving Problem Solving: 5-Solves basic 90% of the time/requires cueing < 10% of the time  Memory Memory: 5-Recognizes or recalls 90% of the time/requires cueing < 10% of the time  Medical Problem List and Plan:  1. 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 DVT Prophylaxis/Anticoagulation: Mechanical: Antiembolism stockings, knee (TED hose) Bilateral lower extremities  Sequential compression devices, below knee Bilateral lower extremitie   -dopplers neg 3. Pain Management: Will increase oxycodone to 15 mg and discontinue prn dilaudid.  4. Mood: LCSW to follow for evaluation and support.  5. Neuropsych: This patient is capable of making decisions on her own behalf.   -xanax prn for anxiety.  -zoloft---increased to   -+ reinforcement by team---see above 6. Skin/Wound Care: Turn patient every 2 hours. Pressure relief measures. Maintain adequate hydration and nutritional status.  7. Hyponatremia: 134  8. Acute renal failure: push po fluids. Monitor for orthostasis. Labs holding steady  -replaced k+ 9. Constipation: +BM recently 10. CV/HTN--EKG  normal  -working on BP mgt  -rx pain symptomatically as well  -appreciate cards assist  LOS (Days) 17 A FACE TO FACE EVALUATION WAS PERFORMED  Tesa Meadors T 07/25/2014 9:20 AM

## 2014-07-25 NOTE — Progress Notes (Signed)
Pt is being discharge home with her family. Discharge instruction was given by Delle Reining , PA.

## 2014-07-28 DIAGNOSIS — G8220 Paraplegia, unspecified: Secondary | ICD-10-CM

## 2014-07-28 DIAGNOSIS — I4891 Unspecified atrial fibrillation: Secondary | ICD-10-CM

## 2014-07-28 DIAGNOSIS — I7100 Dissection of unspecified site of aorta: Secondary | ICD-10-CM

## 2014-07-28 DIAGNOSIS — G9519 Other vascular myelopathies: Secondary | ICD-10-CM

## 2014-07-29 ENCOUNTER — Encounter (HOSPITAL_COMMUNITY): Payer: Self-pay | Admitting: Emergency Medicine

## 2014-07-29 ENCOUNTER — Emergency Department (HOSPITAL_COMMUNITY): Payer: Medicaid Other

## 2014-07-29 ENCOUNTER — Inpatient Hospital Stay (HOSPITAL_COMMUNITY)
Admission: EM | Admit: 2014-07-29 | Discharge: 2014-07-31 | DRG: 682 | Disposition: A | Discharge status: Home Health | Payer: Medicaid Other | Attending: Internal Medicine | Admitting: Internal Medicine

## 2014-07-29 DIAGNOSIS — M129 Arthropathy, unspecified: Secondary | ICD-10-CM | POA: Diagnosis present

## 2014-07-29 DIAGNOSIS — I71019 Dissection of thoracic aorta, unspecified: Secondary | ICD-10-CM

## 2014-07-29 DIAGNOSIS — Z79899 Other long term (current) drug therapy: Secondary | ICD-10-CM | POA: Diagnosis not present

## 2014-07-29 DIAGNOSIS — IMO0001 Reserved for inherently not codable concepts without codable children: Secondary | ICD-10-CM

## 2014-07-29 DIAGNOSIS — K219 Gastro-esophageal reflux disease without esophagitis: Secondary | ICD-10-CM | POA: Diagnosis present

## 2014-07-29 DIAGNOSIS — Z8673 Personal history of transient ischemic attack (TIA), and cerebral infarction without residual deficits: Secondary | ICD-10-CM | POA: Diagnosis not present

## 2014-07-29 DIAGNOSIS — Z993 Dependence on wheelchair: Secondary | ICD-10-CM

## 2014-07-29 DIAGNOSIS — E785 Hyperlipidemia, unspecified: Secondary | ICD-10-CM | POA: Diagnosis present

## 2014-07-29 DIAGNOSIS — N183 Chronic kidney disease, stage 3 unspecified: Secondary | ICD-10-CM | POA: Diagnosis present

## 2014-07-29 DIAGNOSIS — I129 Hypertensive chronic kidney disease with stage 1 through stage 4 chronic kidney disease, or unspecified chronic kidney disease: Secondary | ICD-10-CM | POA: Diagnosis not present

## 2014-07-29 DIAGNOSIS — I7101 Dissection of thoracic aorta: Secondary | ICD-10-CM | POA: Diagnosis present

## 2014-07-29 DIAGNOSIS — R079 Chest pain, unspecified: Secondary | ICD-10-CM | POA: Diagnosis present

## 2014-07-29 DIAGNOSIS — I10 Essential (primary) hypertension: Secondary | ICD-10-CM

## 2014-07-29 DIAGNOSIS — I7123 Aneurysm of the descending thoracic aorta, without rupture: Secondary | ICD-10-CM

## 2014-07-29 DIAGNOSIS — G8220 Paraplegia, unspecified: Secondary | ICD-10-CM | POA: Diagnosis present

## 2014-07-29 DIAGNOSIS — F319 Bipolar disorder, unspecified: Secondary | ICD-10-CM | POA: Diagnosis present

## 2014-07-29 DIAGNOSIS — F419 Anxiety disorder, unspecified: Secondary | ICD-10-CM | POA: Diagnosis present

## 2014-07-29 DIAGNOSIS — G9519 Other vascular myelopathies: Secondary | ICD-10-CM | POA: Diagnosis present

## 2014-07-29 DIAGNOSIS — F411 Generalized anxiety disorder: Secondary | ICD-10-CM | POA: Diagnosis present

## 2014-07-29 DIAGNOSIS — N390 Urinary tract infection, site not specified: Secondary | ICD-10-CM | POA: Diagnosis present

## 2014-07-29 DIAGNOSIS — G9511 Acute infarction of spinal cord (embolic) (nonembolic): Secondary | ICD-10-CM

## 2014-07-29 DIAGNOSIS — I71012 Dissection of descending thoracic aorta: Secondary | ICD-10-CM | POA: Diagnosis present

## 2014-07-29 DIAGNOSIS — N3000 Acute cystitis without hematuria: Secondary | ICD-10-CM

## 2014-07-29 DIAGNOSIS — R030 Elevated blood-pressure reading, without diagnosis of hypertension: Secondary | ICD-10-CM

## 2014-07-29 LAB — COMPREHENSIVE METABOLIC PANEL
ALK PHOS: 146 U/L — AB (ref 39–117)
ALT: 50 U/L — AB (ref 0–35)
AST: 50 U/L — ABNORMAL HIGH (ref 0–37)
Albumin: 3.3 g/dL — ABNORMAL LOW (ref 3.5–5.2)
Anion gap: 18 — ABNORMAL HIGH (ref 5–15)
BUN: 28 mg/dL — ABNORMAL HIGH (ref 6–23)
CO2: 21 meq/L (ref 19–32)
Calcium: 10.2 mg/dL (ref 8.4–10.5)
Chloride: 103 mEq/L (ref 96–112)
Creatinine, Ser: 1.55 mg/dL — ABNORMAL HIGH (ref 0.50–1.10)
GFR calc non Af Amer: 38 mL/min — ABNORMAL LOW (ref >90)
GFR, EST AFRICAN AMERICAN: 44 mL/min — AB (ref >90)
GLUCOSE: 71 mg/dL (ref 70–99)
POTASSIUM: 4.2 meq/L (ref 3.7–5.3)
Sodium: 142 mEq/L (ref 137–147)
Total Bilirubin: 0.4 mg/dL (ref 0.3–1.2)
Total Protein: 7.9 g/dL (ref 6.0–8.3)

## 2014-07-29 LAB — CBC WITH DIFFERENTIAL/PLATELET
Basophils Absolute: 0 10*3/uL (ref 0.0–0.1)
Basophils Relative: 1 % (ref 0–1)
Eosinophils Absolute: 0.2 10*3/uL (ref 0.0–0.7)
Eosinophils Relative: 4 % (ref 0–5)
HCT: 34.5 % — ABNORMAL LOW (ref 36.0–46.0)
Hemoglobin: 11.1 g/dL — ABNORMAL LOW (ref 12.0–15.0)
LYMPHS ABS: 1.8 10*3/uL (ref 0.7–4.0)
LYMPHS PCT: 33 % (ref 12–46)
MCH: 26.9 pg (ref 26.0–34.0)
MCHC: 32.2 g/dL (ref 30.0–36.0)
MCV: 83.5 fL (ref 78.0–100.0)
Monocytes Absolute: 0.5 10*3/uL (ref 0.1–1.0)
Monocytes Relative: 10 % (ref 3–12)
NEUTROS ABS: 2.9 10*3/uL (ref 1.7–7.7)
NEUTROS PCT: 52 % (ref 43–77)
PLATELETS: 382 10*3/uL (ref 150–400)
RBC: 4.13 MIL/uL (ref 3.87–5.11)
RDW: 13.8 % (ref 11.5–15.5)
WBC: 5.5 10*3/uL (ref 4.0–10.5)

## 2014-07-29 LAB — I-STAT CHEM 8, ED
BUN: 26 mg/dL — AB (ref 6–23)
CALCIUM ION: 1.22 mmol/L (ref 1.12–1.23)
CHLORIDE: 108 meq/L (ref 96–112)
CREATININE: 1.7 mg/dL — AB (ref 0.50–1.10)
GLUCOSE: 73 mg/dL (ref 70–99)
HCT: 37 % (ref 36.0–46.0)
Hemoglobin: 12.6 g/dL (ref 12.0–15.0)
Potassium: 4 mEq/L (ref 3.7–5.3)
Sodium: 140 mEq/L (ref 137–147)
TCO2: 22 mmol/L (ref 0–100)

## 2014-07-29 LAB — URINALYSIS, ROUTINE W REFLEX MICROSCOPIC
BILIRUBIN URINE: NEGATIVE
Glucose, UA: NEGATIVE mg/dL
HGB URINE DIPSTICK: NEGATIVE
Ketones, ur: NEGATIVE mg/dL
Nitrite: NEGATIVE
Protein, ur: NEGATIVE mg/dL
SPECIFIC GRAVITY, URINE: 1.015 (ref 1.005–1.030)
Urobilinogen, UA: 1 mg/dL (ref 0.0–1.0)
pH: 6.5 (ref 5.0–8.0)

## 2014-07-29 LAB — I-STAT TROPONIN, ED
TROPONIN I, POC: 0 ng/mL (ref 0.00–0.08)
Troponin i, poc: 0.01 ng/mL (ref 0.00–0.08)

## 2014-07-29 LAB — URINE MICROSCOPIC-ADD ON

## 2014-07-29 LAB — RAPID URINE DRUG SCREEN, HOSP PERFORMED
AMPHETAMINES: NOT DETECTED
Barbiturates: NOT DETECTED
Benzodiazepines: NOT DETECTED
Cocaine: NOT DETECTED
Opiates: POSITIVE — AB
TETRAHYDROCANNABINOL: NOT DETECTED

## 2014-07-29 LAB — PROTIME-INR
INR: 1.05 (ref 0.00–1.49)
Prothrombin Time: 13.7 seconds (ref 11.6–15.2)

## 2014-07-29 MED ORDER — MORPHINE SULFATE 4 MG/ML IJ SOLN
4.0000 mg | Freq: Once | INTRAMUSCULAR | Status: AC
  Administered 2014-07-29: 4 mg via INTRAVENOUS
  Filled 2014-07-29: qty 1

## 2014-07-29 MED ORDER — IOHEXOL 350 MG/ML SOLN
100.0000 mL | Freq: Once | INTRAVENOUS | Status: AC | PRN
  Administered 2014-07-29: 100 mL via INTRAVENOUS

## 2014-07-29 MED ORDER — MORPHINE SULFATE 4 MG/ML IJ SOLN
4.0000 mg | Freq: Once | INTRAMUSCULAR | Status: AC
Start: 2014-07-29 — End: 2014-07-29
  Administered 2014-07-29: 4 mg via INTRAVENOUS
  Filled 2014-07-29: qty 1

## 2014-07-29 MED ORDER — LORAZEPAM 2 MG/ML IJ SOLN: 2.0000 mg | Freq: Once | INTRAMUSCULAR | Status: DC

## 2014-07-29 MED ORDER — NICOTINE 7 MG/24HR TD PT24
7.0000 mg | MEDICATED_PATCH | Freq: Once | TRANSDERMAL | Status: AC
  Administered 2014-07-29: 7 mg via TRANSDERMAL
  Filled 2014-07-29: qty 1

## 2014-07-29 MED ORDER — SODIUM CHLORIDE 0.9 % IV BOLUS (SEPSIS)
500.0000 mL | Freq: Once | INTRAVENOUS | Status: AC
  Administered 2014-07-29: 500 mL via INTRAVENOUS

## 2014-07-29 MED ORDER — LORAZEPAM 2 MG/ML IJ SOLN
1.0000 mg | Freq: Once | INTRAMUSCULAR | Status: AC
  Administered 2014-07-29: 1 mg via INTRAVENOUS
  Filled 2014-07-29: qty 1

## 2014-07-29 NOTE — ED Notes (Signed)
Pt began to hyperventilate when RN told pt that she would be going upstairs to a room and when her family started to leave. Pt reassured by RN and sister. Pt able to calm her breathing.

## 2014-07-29 NOTE — ED Notes (Signed)
Per Levonne Hubert, pt is to wait an hour in ER before being placed in ICU.

## 2014-07-29 NOTE — ED Provider Notes (Signed)
CSN: [id number]     Arrival date & time 07/29/14  1513 History   First MD Initiated Contact with Patient 07/29/14 1527     Chief Complaint  Patient presents with  . Shortness of Breath  . Anxiety     (Consider location/radiation/quality/duration/timing/severity/associated sxs/prior Treatment) HPI  ANALISE GLOTFELTY is a 48 y.o. female past medical history significant for hypertension , bipolar, polycystic kidney, remote polysubstance abuse, brought in by EMS for shortness of breath and heaviness on the chest. 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 As per her sister she found her in the fetal position, hyperventilating complaining of chest pain and shortness of breath. Patient denies fever, chills, cough, abdominal pain, nausea vomiting, change in bowel or bladder habits.   Past Medical History  Diagnosis Date  . Hypertension   . Family history of anesthesia complication     "alot of back pains from the epidurals"  . Complication of anesthesia     "alot of back pains from the epidurals"  . Anemia   . Depression   . GERD (gastroesophageal reflux disease)   . Daily headache   . Migraine     "sometimes twice/day" (07/05/2014)  . Stroke 06/29/2014    residual BLE weakness  . 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 Polycystic kidney disease     /notes 07/05/2014  . 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 Arthritis     "shoulders" (07/05/2014)  . Bipolar disorder   . Anxiety    Past Surgical History  Procedure Laterality Date  . Cesarean section  1986  . Tubal ligation  1986  . Spinal drain placement  06/29/2014  . Spinal drain removal  07/02/2014  . Thoracentesis  07/05/2014    [name] 07/05/2014   No family history on  file. History  Substance Use Topics  . Smoking status: Current Every Day Smoker -- 1.00 packs/day for 37 years    Types: Cigarettes  . Smokeless tobacco: Never Used  . Alcohol Use: Yes     Comment: 07/05/2014 "recovering alcoholic since 2013"   OB History   Grav Para Term Preterm Abortions TAB SAB Ect Mult Living                 Review of Systems  10 systems reviewed and found to be negative, except as noted in the HPI.   Allergies  Seroquel and Olanzapine  Home Medications   Prior to Admission medications   Medication Sig Start Date End Date Taking? Authorizing Provider  acetaminophen (TYLENOL) 325 MG tablet Take 1-2 tablets (325-650 mg total) by mouth every 4 (four) hours as needed for mild pain. 07/25/14  Yes Evlyn Kanner Love, PA-C  ALPRAZolam (XANAX) 0.25 MG tablet Take 1 tablet (0.25 mg total) by mouth 3 (three) times daily as needed for anxiety. 07/25/14  Yes Evlyn Kanner Love, PA-C  atorvastatin (LIPITOR) 20 MG tablet Take 1 tablet (20 mg total) by mouth daily at 6 PM. 07/25/14  Yes Evlyn Kanner Love, PA-C  famotidine (PEPCID) 20 MG tablet Take 1 tablet (20 mg total) by mouth 2 (two) times daily. 07/25/14  Yes Evlyn Kanner Love, PA-C  gabapentin (NEURONTIN) 100 MG capsule Take 2 capsules (200 mg total) by mouth 3 (  three) times daily. 07/25/14  Yes Evlyn Kanner Love, PA-C  hydrALAZINE (APRESOLINE) 100 MG tablet Take 1 tablet (100 mg total) by mouth every 8 (eight) hours. 07/25/14  Yes Evlyn Kanner Love, PA-C  hydrochlorothiazide (MICROZIDE) 12.5 MG capsule Take 1 capsule (12.5 mg total) by mouth daily. 07/25/14  Yes Evlyn Kanner Love, PA-C  labetalol (NORMODYNE) 300 MG tablet Take 1 tablet (300 mg total) by mouth 2 (two) times daily. 07/25/14  Yes Evlyn Kanner Love, PA-C  methocarbamol (ROBAXIN) 500 MG tablet Take 1 tablet (500 mg total) by mouth every 6 (six) hours as needed for muscle spasms. 07/25/14  Yes Evlyn Kanner Love, PA-C  polyethylene glycol (MIRALAX / GLYCOLAX) packet Take 17 g by mouth daily after supper. 07/25/14   Yes Evlyn Kanner Love, PA-C  potassium chloride SA (K-DUR,KLOR-CON) 20 MEQ tablet Take 1 tablet (20 mEq total) by mouth 2 (two) times daily. 07/25/14  Yes Evlyn Kanner Love, PA-C  sertraline (ZOLOFT) 100 MG tablet Take 1 tablet (100 mg total) by mouth daily. 07/25/14  Yes Evlyn Kanner Love, PA-C  traMADol (ULTRAM) 50 MG tablet Take 1 tablet (50 mg total) by mouth every 6 (six) hours as needed for moderate pain. 07/25/14  Yes Evlyn Kanner Love, PA-C  bisacodyl (DULCOLAX) 10 MG suppository Place 1 suppository (10 mg total) rectally daily at 6 (six) AM. For bowel program. 07/25/14   Jacquelynn Cree, PA-C  feeding supplement, ENSURE COMPLETE, (ENSURE COMPLETE) LIQD Take 237 mLs by mouth 3 (three) times daily between meals. 07/08/14   Christiane Ha, MD  nicotine (NICODERM CQ - DOSED IN MG/24 HOURS) 14 mg/24hr patch Place 1 patch (14 mg total) onto the skin daily. 07/25/14   Jacquelynn Cree, PA-C  oxyCODONE 10 MG TABS Take 1-1.5 tablets (10-15 mg total) by mouth every 6 (six) hours as needed for severe pain. 07/25/14   Evlyn Kanner Love, PA-C   BP 144/86  Pulse 57  Temp(Src) 97.7 F (36.5 C) (Oral)  Resp 17  SpO2 96% Physical Exam  Nursing note and vitals reviewed. Constitutional: [name], place, and time. She appears well-developed and well-nourished. She appears distressed.  HENT:  Head: Normocephalic and atraumatic.  Mouth/Throat: Oropharynx is clear and moist.  Eyes: Conjunctivae and EOM are normal. Pupils are equal, round, and reactive to light.  Neck: Normal range of motion. Neck supple. No JVD present.  Cardiovascular: Normal rate, regular rhythm and intact distal pulses.   No radial pulse differential  Pulmonary/Chest: Breath sounds normal. No stridor. No respiratory distress. She has no wheezes. She has no rales. She exhibits no tenderness.  Hyperventilating, axillary or movement in all fields  Abdominal: Soft. Bowel sounds are normal. She exhibits no distension and no mass. There is no tenderness.  There is no rebound and no guarding.  Musculoskeletal: Normal range of motion. She exhibits no edema and no tenderness.  Neurological: She is alert and oriented to person, place, and time.  Strength is 4/5 in upper extremities, 3/5 in right lower extremity and left lower extremity is not able to abduct at the hip however she can flex at the ankle.  Psychiatric: She has a normal mood and affect.    ED Course  Procedures (including critical care time) Labs Review Labs Reviewed  CBC WITH DIFFERENTIAL - Abnormal; Notable for the following:    Hemoglobin 11.1 (*)    HCT 34.5 (*)    All other components within normal limits  COMPREHENSIVE METABOLIC PANEL - Abnormal;  Notable for the following:    BUN 28 (*)    Creatinine, Ser 1.55 (*)    Albumin 3.3 (*)    AST 50 (*)    ALT 50 (*)    Alkaline Phosphatase 146 (*)    GFR calc non Af Amer 38 (*)    GFR calc Af Amer 44 (*)    Anion gap 18 (*)    All other components within normal limits  URINALYSIS, ROUTINE W REFLEX MICROSCOPIC - Abnormal; Notable for the following:    APPearance CLOUDY (*)    Leukocytes, UA TRACE (*)    All other components within normal limits  URINE RAPID DRUG SCREEN (HOSP PERFORMED) - Abnormal; Notable for the following:    Opiates POSITIVE (*)    All other components within normal limits  URINE MICROSCOPIC-ADD ON - Abnormal; Notable for the following:    Squamous Epithelial / LPF FEW (*)    All other components within normal limits  I-STAT CHEM 8, ED - Abnormal; Notable for the following:    BUN 26 (*)    Creatinine, Ser 1.70 (*)    All other components within normal limits  PROTIME-INR  I-STAT TROPOININ, ED  I-STAT TROPOININ, ED    Imaging Review Ct Angio Abdomen W/cm &/or Wo Contrast  07/29/2014   CLINICAL DATA:  Short of breath and pain  EXAM: CT ANGIOGRAPHY CHEST AND ABDOMEN  TECHNIQUE: Multidetector CT imaging of the chest and abdomen was performed using the standard protocol during bolus administration  of intravenous contrast. Multiplanar CT image reconstructions and MIPs were obtained to evaluate the vascular anatomy.  CONTRAST:  OMNIPAQUE IOHEXOL 350 MG/ML SOLN  COMPARISON:  07/04/2014  FINDINGS: CTA CHEST FINDINGS  Intramural hematoma beginning beyond the takeoff of the LEs subclavian artery and extending into the lower thoracic aorta is stable. There is a small area of contrast opacification of the false lumen in the descending aorta. See image 69 of series 9. Great vessels are patent. Vertebral and carotid arteries are patent within the confines of the exam. No obvious acute pulmonary thromboembolism. No abnormal mediastinal adenopathy or pericardial effusion. Dependent atelectasis No pneumothorax. Tiny pleural effusions.  Review of the MIP images confirms the above findings.  CTA ABDOMEN FINDINGS  The aortic dissection has slightly progressed since the prior study. There are areas of contrast opacification of the false lumen. False lumen opacifies posteriorly on image 107 of series 9 as well as 114 of series 9. These focal dissections do not appear flow limiting.  Poor contrast opacification below the aortic bifurcation. There is no obvious dissection in the iliac vasculature. External and common iliac arteries are patent. Internal iliac arteries are patent.  Atherosclerotic changes of the single bilateral renal arteries without significant focal narrowing. Celiac narrowing is stable. SMA is patent. Branch vessels of the celiac and SMA are grossly patent. IMA is patent. Branch vessels are patent. Accessory left hepatic artery anatomy is present.  Innumerable cysts in the kidneys in liver are stable.  Spleen, pancreas, adrenal glands are within normal limits. Gallbladder is unremarkable.  Uterus is lobulated worrisome for fibroids. Bladder is unremarkable. Normal appendix.  No free-fluid.  Review of the MIP images confirms the above findings.  IMPRESSION: Within the thorax, the aortic dissection is  again noted. It remains nearly completely thrombosed, however there is a small area of contrast opacification in the false lumen in the descending aorta. The great vessels remain patent.  Within the abdomen, there are 2 focal  areas of contrast opacification in the false lumen, along the posterior aorta. These focal dissections do not appear flow limiting. Visceral vasculature remains patent without evidence of dissection flap.  Stable appearance of the organs otherwise.   Electronically Signed   By: Maryclare Bean M.D.   On: 07/29/2014 20:09   Dg Chest Port 1 View  07/29/2014   CLINICAL DATA:  Shortness of breath.  Hyperventilation.  EXAM: PORTABLE CHEST - 1 VIEW  COMPARISON:  07/14/2014  FINDINGS: The heart size and mediastinal contours are within normal limits. Both lungs are clear. No evidence of pneumothorax or pleural effusion. The visualized skeletal structures are unremarkable.  IMPRESSION: No active disease.   Electronically Signed   By: Myles Rosenthal M.D.   On: 07/29/2014 16:17   Ct Angio Chest Aortic Dissect W &/or W/o  07/29/2014   CLINICAL DATA:  Short of breath and pain  EXAM: CT ANGIOGRAPHY CHEST AND ABDOMEN  TECHNIQUE: Multidetector CT imaging of the chest and abdomen was performed using the standard protocol during bolus administration of intravenous contrast. Multiplanar CT image reconstructions and MIPs were obtained to evaluate the vascular anatomy.  CONTRAST:  OMNIPAQUE IOHEXOL 350 MG/ML SOLN  COMPARISON:  07/04/2014  FINDINGS: CTA CHEST FINDINGS  Intramural hematoma beginning beyond the takeoff of the LEs subclavian artery and extending into the lower thoracic aorta is stable. There is a small area of contrast opacification of the false lumen in the descending aorta. See image 69 of series 9. Great vessels are patent. Vertebral and carotid arteries are patent within the confines of the exam. No obvious acute pulmonary thromboembolism. No abnormal mediastinal adenopathy or pericardial  effusion. Dependent atelectasis No pneumothorax. Tiny pleural effusions.  Review of the MIP images confirms the above findings.  CTA ABDOMEN FINDINGS  The aortic dissection has slightly progressed since the prior study. There are areas of contrast opacification of the false lumen. False lumen opacifies posteriorly on image 107 of series 9 as well as 114 of series 9. These focal dissections do not appear flow limiting.  Poor contrast opacification below the aortic bifurcation. There is no obvious dissection in the iliac vasculature. External and common iliac arteries are patent. Internal iliac arteries are patent.  Atherosclerotic changes of the single bilateral renal arteries without significant focal narrowing. Celiac narrowing is stable. SMA is patent. Branch vessels of the celiac and SMA are grossly patent. IMA is patent. Branch vessels are patent. Accessory left hepatic artery anatomy is present.  Innumerable cysts in the kidneys in liver are stable.  Spleen, pancreas, adrenal glands are within normal limits. Gallbladder is unremarkable.  Uterus is lobulated worrisome for fibroids. Bladder is unremarkable. Normal appendix.  No free-fluid.  Review of the MIP images confirms the above findings.  IMPRESSION: Within the thorax, the aortic dissection is again noted. It remains nearly completely thrombosed, however there is a small area of contrast opacification in the false lumen in the descending aorta. The great vessels remain patent.  Within the abdomen, there are 2 focal areas of contrast opacification in the false lumen, along the posterior aorta. These focal dissections do not appear flow limiting. Visceral vasculature remains patent without evidence of dissection flap.  Stable appearance of the organs otherwise.   Electronically Signed   By: Maryclare Bean M.D.   On: 07/29/2014 20:09     EKG Interpretation   Date/Time:  Monday July 29 2014 15:25:01 EDT Ventricular Rate:  57 PR Interval:  117 QRS  Duration: 80 QT  Interval:  428 QTC Calculation: 417 R Axis:   59 Text Interpretation:  Sinus rhythm Borderline short PR interval Probable  left atrial enlargement Probable left ventricular hypertrophy Nonspecific  T abnormalities, lateral leads T waves now upging laterally Confirmed by  Rubin Payor  MD, NATHAN [phone]) on 07/29/2014 9:46:26 PM      MDM   Final diagnoses:  Elevated blood pressure  Dissecting aneurysm of thoracic aorta, Stanford type B   Filed Vitals:   07/29/14 2045 07/29/14 2100 07/29/14 2130 07/29/14 2145  BP: 164/86 173/84 166/86 144/86  Pulse: 55 57 62 57  Temp:      TempSrc:      Resp: SpO2: 92% 93% 96% 96%    Medications  nicotine (NICODERM CQ - dosed in mg/24 hr) patch 7 mg (7 mg Transdermal Patch Applied 07/29/14 1728)  morphine 4 MG/ML injection 4 mg (4 mg Intravenous Given 07/29/14 1616)  LORazepam (ATIVAN) injection 1 mg (1 mg Intravenous Given 07/29/14 1613)  sodium chloride 0.9 % bolus 500 mL (0 mLs Intravenous Stopped 07/29/14 1833)  morphine 4 MG/ML injection 4 mg (4 mg Intravenous Given 07/29/14 1833)  iohexol (OMNIPAQUE) 350 MG/ML injection 100 mL (100 mLs Intravenous Contrast Given 07/29/14 1915)  morphine 4 MG/ML injection 4 mg (4 mg Intravenous Given 07/29/14 2016)    KENEDI CILIA is a 48 y.o. female presenting with chest pain shortness of breath. Patient appears to be having an anxiety attack. She was recently admitted for a type B dissection that resulted in spinal infarct. EKG with no signs of ischemia, troponin is negative and blood work is otherwise unremarkable. Patient recently significantly after she was given Ativan and morphine, however she still reports a mild chest pain. CT angio chest and abdomen is ordered.   CT shows resolution of intramural hematoma however there is blood flow in intramural space. Verbal report called to attending physician Dr. Rubin Payor. He has discussed the case with intensivist who thinks patient should be  admitted to hospitalist for more aggressive blood pressure control. Case discussed with [facility] hospitalist Dr. Adela Glimpse request cardiothoracic be involved and make medication management suggestions. Case discussed with cardiothoracic surgeon Dr. Alla German appreciated: He has personally reviewed the CAT scan and states that he thinks that overall it is improving however her blood pressure needs to be better controlled. He will consult on the patient in the morning. He recommends hydralazine 10 mg every 2 hours administered if systolic is over 604.  Wynetta Emery, PA-C 07/29/14 2149

## 2014-07-29 NOTE — ED Provider Notes (Signed)
Medical screening examination/treatment/procedure(s) were conducted as a shared visit with non-physician practitioner(s) and myself.  I personally evaluated the patient during the encounter.   EKG Interpretation   Date/Time:  Monday July 29 2014 15:25:01 EDT Ventricular Rate:  57 PR Interval:  117 QRS Duration: 80 QT Interval:  428 QTC Calculation: 417 R Axis:   59 Text Interpretation:  Sinus rhythm Borderline short PR interval Probable  left atrial enlargement Probable left ventricular hypertrophy Nonspecific  T abnormalities, lateral leads T waves now upging laterally Confirmed by  Rubin Payor  MD, Harrold Donath [phone]) on 07/29/2014 9:46:26 PM     Patient with chest pain. Recent aortic dissection. May be component of anxiety, however CTA didn't show some new blood in her intramural hematoma. Will admit for tighter blood pressure control. Discussed with critical care and cardiothoracic surgery  Juliet Rude. Rubin Payor, MD 07/29/14 2316

## 2014-07-29 NOTE — H&P (Signed)
PCP:  Willey Blade, MD    Chief Complaint:  anxiety  HPI: Charlotte [name] is a 48 y.o. 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 Patient was discharged 4 days ago and since arrival to home have been having generalized pain in chest, abdomen and lower legs as well as frequent anxiety attacks. Patient today developed an episode of sudden onset of shortness of breath chest pain which he describes as sharp, gasping for air. The family was very concerned and brought her in to [facility]. Patient had been having episodic tachypnea events while in emergency department seemed to be improved with anxiolytics. Patient has learning disability and unable to provide detailed history of her own. Family is at bedside. All questions has been answered CT of the chest and Abdomen showed stable dissection with  small area of contrast opacification in the false lumen do not appear flow limiting. ER discussed with CT surgery who recomended to admit patient to Medicine for blood pressure control and they will see her in AM.   Hospitalist was called for admission for hypertension in the setting of Type B aortic dissection  Review of Systems:    Pertinent positives include: chest pain, shortness of breath at rest.   Constitutional:  No weight loss, night sweats, Fevers, chills, fatigue, weight loss  HEENT:  No headaches, Difficulty swallowing,Tooth/dental problems,Sore throat,  No  sneezing, itching, ear ache, nasal congestion, post nasal drip,  Cardio-vascular:  No Orthopnea, PND, anasarca, dizziness, palpitations.no Bilateral lower extremity swelling  GI:  No heartburn, indigestion, abdominal pain, nausea, vomiting, diarrhea, change in bowel habits, loss of appetite, melena, blood in stool, hematemesis Resp:  no No dyspnea on exertion, No excess mucus, no productive cough, No non-productive cough, No coughing up of blood.No change in color of mucus.No wheezing. Skin:  no rash or lesions. No jaundice GU:  no dysuria, change in color of urine, no urgency or frequency. No straining to urinate.  No flank pain.  Musculoskeletal:  No joint pain or no joint swelling. No decreased range of motion. No back pain.  Psych:  No change in mood or affect. No depression or anxiety. No memory loss.  Neuro: no localizing neurological complaints, no tingling, no weakness, no double vision, no gait abnormality, no slurred speech, no confusion  Otherwise ROS are negative except for above, 10 systems were reviewed  Past Medical History: Past Medical History  Diagnosis Date  . Hypertension   . Family history of anesthesia complication     "alot of back pains from the epidurals"  . Complication of anesthesia     "alot of back pains from the epidurals"  . Anemia   . Depression   . GERD (gastroesophageal reflux disease)   . Daily headache   . Migraine     "sometimes twice/day" (07/05/2014)  . Stroke 06/29/2014    residual BLE weakness  . 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 Polycystic kidney disease     /notes 07/05/2014  . Paraparesis of lower extremity due to  spinal cord ischemia     /notes 07/05/2014  . Arthritis     "shoulders" (07/05/2014)  .Hattie Perchpolar disorder   . Anxiety    Past Surgical History  Procedure Laterality Date  . Cesarean section  1986  . Tubal ligation  1986  . Spinal drain placement  06/29/2014  . Spinal  drain removal  07/02/2014  . Thoracentesis  07/05/2014    [name] 07/05/2014     Medications: Prior to Admission medications   Medication Sig Start Date End Date Taking? Authorizing Provider  acetaminophen (TYLENOL) 325 MG tablet Take 1-2 tablets (325-650 mg total) by mouth every 4 (four) hours as needed for mild pain. 07/25/14  Yes Evlyn Kanner Love, PA-C  ALPRAZolam (XANAX) 0.25 MG tablet Take 1 tablet (0.25 mg total) by mouth 3 (three) times daily as needed for anxiety. 07/25/14  Yes Evlyn Kanner Love, PA-C  atorvastatin (LIPITOR) 20 MG tablet Take 1 tablet (20 mg total) by mouth daily at 6 PM. 07/25/14  Yes Evlyn Kanner Love, PA-C  famotidine (PEPCID) 20 MG tablet Take 1 tablet (20 mg total) by mouth 2 (two) times daily. 07/25/14  Yes Evlyn Kanner Love, PA-C  gabapentin (NEURONTIN) 100 MG capsule Take 2 capsules (200 mg total) by mouth 3 (three) times daily. 07/25/14  Yes Evlyn Kanner Love, PA-C  hydrALAZINE (APRESOLINE) 100 MG tablet Take 1 tablet (100 mg total) by mouth every 8 (eight) hours. 07/25/14  Yes Evlyn Kanner Love, PA-C  hydrochlorothiazide (MICROZIDE) 12.5 MG capsule Take 1 capsule (12.5 mg total) by mouth daily. 07/25/14  Yes Evlyn Kanner Love, PA-C  labetalol (NORMODYNE) 300 MG tablet Take 1 tablet (300 mg total) by mouth 2 (two) times daily. 07/25/14  Yes Evlyn Kanner Love, PA-C  methocarbamol (ROBAXIN) 500 MG tablet Take 1 tablet (500 mg total) by mouth every 6 (six) hours as needed for muscle spasms. 07/25/14  Yes Evlyn Kanner Love, PA-C  polyethylene glycol (MIRALAX / GLYCOLAX) packet Take 17 g by mouth daily after supper. 07/25/14  Yes Evlyn Kanner Love, PA-C  potassium chloride SA (K-DUR,KLOR-CON) 20 MEQ tablet Take 1 tablet (20 mEq total) by mouth 2 (two) times daily. 07/25/14  Yes Evlyn Kanner Love, PA-C  sertraline (ZOLOFT) 100 MG tablet Take 1 tablet (100 mg total) by mouth daily. 07/25/14  Yes Evlyn Kanner Love, PA-C  traMADol (ULTRAM) 50 MG tablet Take 1 tablet (50 mg total) by mouth every 6 (six) hours as needed for moderate pain. 07/25/14   Yes Evlyn Kanner Love, PA-C  bisacodyl (DULCOLAX) 10 MG suppository Place 1 suppository (10 mg total) rectally daily at 6 (six) AM. For bowel program. 07/25/14   Jacquelynn Cree, PA-C  feeding supplement, ENSURE COMPLETE, (ENSURE COMPLETE) LIQD Take 237 mLs by mouth 3 (three) times daily between meals. 07/08/14   Christiane Ha, MD  nicotine (NICODERM CQ - DOSED IN MG/24 HOURS) 14 mg/24hr patch Place 1 patch (14 mg total) onto the skin daily. 07/25/14   Jacquelynn Cree, PA-C  oxyCODONE 10 MG TABS Take 1-1.5 tablets (10-15 mg total) by mouth every 6 (six) hours as needed for severe pain. 07/25/14   Jacquelynn Cree, PA-C    Allergies:   Allergies  Allergen Reactions  . Seroquel [Quetiapine] Other (See Comments)    Night sweats, dizziness and possible confusion  . Olanzapine Nausea And Vomiting    Social History:  Ambulatory  wheelchair bound  Lives at home  With family     reports that she has  quit smoking. Her smoking use included Cigarettes. She has a 37 pack-year smoking history. She has never used smokeless tobacco. She reports that she drinks alcohol. She reports that she uses illicit drugs ("Crack" cocaine).    Family History: family history includes Diabetes type II in her other; Stroke in her father.    Physical Exam: Patient Vitals for the past 24 hrs:  BP Temp Temp src Pulse Resp SpO2  07/29/14 2145 144/86 mmHg - - 57 17 96 %  07/29/14 2130 166/86 mmHg - - 62 18 96 %  07/29/14 2100 173/84 mmHg - - 57 18 93 %  07/29/14 2045 164/86 mmHg - - 55 14 92 %  07/29/14 2030 161/81 mmHg - - 56 19 93 %  07/29/14 2015 167/83 mmHg - - 56 18 91 %  07/29/14 2000 158/75 mmHg - - 54 15 94 %  07/29/14 1945 160/81 mmHg - - 46 14 94 %  07/29/14 1900 154/76 mmHg - - 64 15 96 %  07/29/14 1845 157/74 mmHg - - 58 17 94 %  07/29/14 1830 163/77 mmHg - - 59 19 98 %  07/29/14 1815 156/82 mmHg - - 57 22 95 %  07/29/14 1800 153/80 mmHg - - 62 17 94 %  07/29/14 1745 153/96 mmHg - - 64 15 93 %  07/29/14 1743  - - - 62 17 95 %  07/29/14 1730 158/88 mmHg - - 68 46 95 %  07/29/14 1715 158/94 mmHg - - 64 20 94 %  07/29/14 1700 159/83 mmHg - - 59 21 92 %  07/29/14 1645 163/88 mmHg - - 65 27 91 %  07/29/14 1630 145/70 mmHg - - 59 45 97 %  07/29/14 1615 153/82 mmHg - - 60 59 96 %  07/29/14 1600 154/83 mmHg - - 58 43 100 %  07/29/14 1540 144/86 mmHg 97.7 F (36.5 C) Oral 57 13 100 %  07/29/14 1516 - - - - - 99 %    1. General:  in No Acute distress 2. Psychological: Alert and  Oriented 3. Head/ENT:   Moist Mucous Membranes                          Head Non traumatic, neck supple                          Normal   Dentition 4. SKIN: normal   Skin turgor,  Skin clean Dry and intact no rash 5. Heart: Regular rate and rhythm no Murmur, Rub or gallop 6. Lungs: Clear to auscultation bilaterally, no wheezes or crackles   7. Abdomen: Soft, non-tender, Non distended 8. Lower extremities: no clubbing, cyanosis, or edema 9. Neurologically Grossly intact, moving all 4 extremities equally 10. MSK: Normal range of motion  body mass index is unknown because there is no weight on file.   Labs on Admission:   Recent Labs  07/29/14 1622 07/29/14 1639  NA 142 140  K 4.2 4.0  CL 103 108  CO2 21  --   GLUCOSE 71 73  BUN 28* 26*  CREATININE 1.55* 1.70*  CALCIUM 10.2  --     Recent Labs  07/29/14 1622  AST 50*  ALT 50*  ALKPHOS 146*  BILITOT 0.4  PROT 7.9  ALBUMIN 3.3*   No results found for this basename: LIPASE, AMYLASE,  in the last 72 hours  Recent Labs  07/29/14 1622 07/29/14  1639  WBC 5.5  --   NEUTROABS 2.9  --   HGB 11.1* 12.6  HCT 34.5* 37.0  MCV 83.5  --   PLT 382  --    No results found for this basename: CKTOTAL, CKMB, CKMBINDEX, TROPONINI,  in the last 72 hours No results found for this basename: TSH, T4TOTAL, FREET3, T3FREE, THYROIDAB,  in the last 72 hours No results found for this basename: VITAMINB12, FOLATE, FERRITIN, TIBC, IRON, RETICCTPCT,  in the last 72  hours Lab Results  Component Value Date   HGBA1C 5.7* 06/30/2014    The CrCl is unknown because both a height and weight (above a minimum accepted value) are required for this calculation. ABG    Component Value Date/Time   TCO2 22 07/29/2014 1639     No results found for this basename: DDIMER     Other results:  I have pearsonaly reviewed this: ECG REPORT  Rate: 57  Rhythm: Sinus brady ST&T Change: no ischemic changes   UA mild UTI  BNP (last 3 results) No results found for this basename: PROBNP,  in the last 8760 hours  There were no vitals filed for this visit.   Cultures:    Component Value Date/Time   SDES URINE, CLEAN CATCH 07/08/2014 1459   SPECREQUEST NONE 07/08/2014 1459   CULT  Value: STAPHYLOCOCCUS SPECIES (COAGULASE NEGATIVE) Note: RIFAMPIN AND GENTAMICIN SHOULD NOT BE USED AS SINGLE DRUGS FOR TREATMENT OF STAPH INFECTIONS. Performed at [facility] 07/08/2014 1459   REPTSTATUS 07/10/2014 FINAL 07/08/2014 1459    Radiological Exams on Admission: Ct Angio Abdomen W/cm &/or Wo Contrast  07/29/2014   CLINICAL DATA:  Short of breath and pain  EXAM: CT ANGIOGRAPHY CHEST AND ABDOMEN  TECHNIQUE: Multidetector CT imaging of the chest and abdomen was performed using the standard protocol during bolus administration of intravenous contrast. Multiplanar CT image reconstructions and MIPs were obtained to evaluate the vascular anatomy.  CONTRAST:  OMNIPAQUE IOHEXOL 350 MG/ML SOLN  COMPARISON:  07/04/2014  FINDINGS: CTA CHEST FINDINGS  Intramural hematoma beginning beyond the takeoff of the LEs subclavian artery and extending into the lower thoracic aorta is stable. There is a small area of contrast opacification of the false lumen in the descending aorta. See image 69 of series 9. Great vessels are patent. Vertebral and carotid arteries are patent within the confines of the exam. No obvious acute pulmonary thromboembolism. No abnormal mediastinal adenopathy or  pericardial effusion. Dependent atelectasis No pneumothorax. Tiny pleural effusions.  Review of the MIP images confirms the above findings.  CTA ABDOMEN FINDINGS  The aortic dissection has slightly progressed since the prior study. There are areas of contrast opacification of the false lumen. False lumen opacifies posteriorly on image 107 of series 9 as well as 114 of series 9. These focal dissections do not appear flow limiting.  Poor contrast opacification below the aortic bifurcation. There is no obvious dissection in the iliac vasculature. External and common iliac arteries are patent. Internal iliac arteries are patent.  Atherosclerotic changes of the single bilateral renal arteries without significant focal narrowing. Celiac narrowing is stable. SMA is patent. Branch vessels of the celiac and SMA are grossly patent. IMA is patent. Branch vessels are patent. Accessory left hepatic artery anatomy is present.  Innumerable cysts in the kidneys in liver are stable.  Spleen, pancreas, adrenal glands are within normal limits. Gallbladder is unremarkable.  Uterus is lobulated worrisome for fibroids. Bladder is unremarkable. Normal appendix.  No free-fluid.  Review of the MIP images confirms the above findings.  IMPRESSION: Within the thorax, the aortic dissection is again noted. It remains nearly completely thrombosed, however there is a small area of contrast opacification in the false lumen in the descending aorta. The great vessels remain patent.  Within the abdomen, there are 2 focal areas of contrast opacification in the false lumen, along the posterior aorta. These focal dissections do not appear flow limiting. Visceral vasculature remains patent without evidence of dissection flap.  Stable appearance of the organs otherwise.   Electronically Signed   By: Maryclare Bean M.D.   On: 07/29/2014 20:09   Dg Chest Port 1 View  07/29/2014   CLINICAL DATA:  Shortness of breath.  Hyperventilation.  EXAM: PORTABLE CHEST - 1  VIEW  COMPARISON:  07/14/2014  FINDINGS: The heart size and mediastinal contours are within normal limits. Both lungs are clear. No evidence of pneumothorax or pleural effusion. The visualized skeletal structures are unremarkable.  IMPRESSION: No active disease.   Electronically Signed   By: Myles Rosenthal M.D.   On: 07/29/2014 16:17   Ct Angio Chest Aortic Dissect W &/or W/o  07/29/2014   CLINICAL DATA:  Short of breath and pain  EXAM: CT ANGIOGRAPHY CHEST AND ABDOMEN  TECHNIQUE: Multidetector CT imaging of the chest and abdomen was performed using the standard protocol during bolus administration of intravenous contrast. Multiplanar CT image reconstructions and MIPs were obtained to evaluate the vascular anatomy.  CONTRAST:  OMNIPAQUE IOHEXOL 350 MG/ML SOLN  COMPARISON:  07/04/2014  FINDINGS: CTA CHEST FINDINGS  Intramural hematoma beginning beyond the takeoff of the LEs subclavian artery and extending into the lower thoracic aorta is stable. There is a small area of contrast opacification of the false lumen in the descending aorta. See image 69 of series 9. Great vessels are patent. Vertebral and carotid arteries are patent within the confines of the exam. No obvious acute pulmonary thromboembolism. No abnormal mediastinal adenopathy or pericardial effusion. Dependent atelectasis No pneumothorax. Tiny pleural effusions.  Review of the MIP images confirms the above findings.  CTA ABDOMEN FINDINGS  The aortic dissection has slightly progressed since the prior study. There are areas of contrast opacification of the false lumen. False lumen opacifies posteriorly on image 107 of series 9 as well as 114 of series 9. These focal dissections do not appear flow limiting.  Poor contrast opacification below the aortic bifurcation. There is no obvious dissection in the iliac vasculature. External and common iliac arteries are patent. Internal iliac arteries are patent.  Atherosclerotic changes of the single bilateral  renal arteries without significant focal narrowing. Celiac narrowing is stable. SMA is patent. Branch vessels of the celiac and SMA are grossly patent. IMA is patent. Branch vessels are patent. Accessory left hepatic artery anatomy is present.  Innumerable cysts in the kidneys in liver are stable.  Spleen, pancreas, adrenal glands are within normal limits. Gallbladder is unremarkable.  Uterus is lobulated worrisome for fibroids. Bladder is unremarkable. Normal appendix.  No free-fluid.  Review of the MIP images confirms the above findings.  IMPRESSION: Within the thorax, the aortic dissection is again noted. It remains nearly completely thrombosed, however there is a small area of contrast opacification in the false lumen in the descending aorta. The great vessels remain patent.  Within the abdomen, there are 2 focal areas of contrast opacification in the false lumen, along the posterior aorta. These focal dissections do not appear flow limiting. Visceral vasculature remains patent without  evidence of dissection flap.  Stable appearance of the organs otherwise.   Electronically Signed   By: Maryclare Bean M.D.   On: 07/29/2014 20:09    Chart has been reviewed  Assessment/Plan  48 year old female who was admitted 06/29/14 with a type III dissection and a spinal cord infarction resulting in lower extremity paralysis here with shortness of breath and chest discomfort a CT scan showing stable aortic dissection. Case has been discussed with cardiothoracic surgery. Patient is being admitted to step down for blood pressure management.    Present on Admission:  . Type 3 dissection of thoracic aorta -cardiothoracic surgery has been on Voltaren reviewed the imaging. Recommend blood pressure management and IV hydralazine as needed. To maintain blood pressure below 160. We'll admit to step down alerted [facility] for e-link monitoring  . HTN (hypertension), malignant - continue home medications of hydralazine and labetalol.  Rectal hydralazine IV when necessary. Watch carefully and step down  . Chronic renal insufficiency, stage III (moderate) - stable continue to monitor  . Anxiety - initiated Klonopin continue Xanax when necessary  . UTI (urinary tract infection) - mild will treat with Rocephin await results of urine culture   . 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  I have spent a total of 65 min on this admission Strattera was taken to discuss case with family and [facility] M.  Therisa Doyne 07/29/2014, 9:51 PM  [facility]  Pager [phone]   If 7AM-7PM, please contact the day team taking care of the patient  [email or web address]  Password [email or web address]

## 2014-07-29 NOTE — ED Notes (Signed)
Pt from home via [facility] c/o of shortness of breath that began this am. Patient anxious at this time and hyperventilating. Oxygen saturation maintained in upper 90's with out O2. Recent HX of stroke in aug. Negative NIH.

## 2014-07-29 NOTE — ED Notes (Signed)
RN went into room to hang IV fluids. Pt calm and resting in bed. Once RN began to speak to Pt, Pt started to hyperventilate and began asking for morphine. RN told pt to breathe deeply, breathing back under control but still fast.

## 2014-07-29 NOTE — ED Notes (Signed)
Bed: WA01 Expected date:  Expected time:  Means of arrival:  Comments: ems 

## 2014-07-30 LAB — CBC
HCT: 34.3 % — ABNORMAL LOW (ref 36.0–46.0)
Hemoglobin: 11 g/dL — ABNORMAL LOW (ref 12.0–15.0)
MCH: 27.4 pg (ref 26.0–34.0)
MCHC: 32.1 g/dL (ref 30.0–36.0)
MCV: 85.3 fL (ref 78.0–100.0)
PLATELETS: 336 10*3/uL (ref 150–400)
RBC: 4.02 MIL/uL (ref 3.87–5.11)
RDW: 13.9 % (ref 11.5–15.5)
WBC: 4 10*3/uL (ref 4.0–10.5)

## 2014-07-30 LAB — COMPREHENSIVE METABOLIC PANEL
ALT: 39 U/L — ABNORMAL HIGH (ref 0–35)
AST: 40 U/L — ABNORMAL HIGH (ref 0–37)
Albumin: 2.9 g/dL — ABNORMAL LOW (ref 3.5–5.2)
Alkaline Phosphatase: 129 U/L — ABNORMAL HIGH (ref 39–117)
Anion gap: 13 (ref 5–15)
BUN: 23 mg/dL (ref 6–23)
CHLORIDE: 103 meq/L (ref 96–112)
CO2: 23 mEq/L (ref 19–32)
CREATININE: 1.4 mg/dL — AB (ref 0.50–1.10)
Calcium: 9.8 mg/dL (ref 8.4–10.5)
GFR calc Af Amer: 50 mL/min — ABNORMAL LOW (ref >90)
GFR, EST NON AFRICAN AMERICAN: 43 mL/min — AB (ref >90)
Glucose, Bld: 92 mg/dL (ref 70–99)
Potassium: 3.8 mEq/L (ref 3.7–5.3)
Sodium: 139 mEq/L (ref 137–147)
Total Bilirubin: 0.4 mg/dL (ref 0.3–1.2)
Total Protein: 7.2 g/dL (ref 6.0–8.3)

## 2014-07-30 LAB — TROPONIN I
Troponin I: <0.3 ng/mL (ref <0.30)
Troponin I: <0.3 ng/mL (ref <0.30)

## 2014-07-30 LAB — MRSA PCR SCREENING: MRSA by PCR: NEGATIVE

## 2014-07-30 LAB — MAGNESIUM: Magnesium: 2 mg/dL (ref 1.5–2.5)

## 2014-07-30 LAB — TSH: TSH: 1.5 u[IU]/mL (ref 0.350–4.500)

## 2014-07-30 LAB — PHOSPHORUS: Phosphorus: 5.2 mg/dL — ABNORMAL HIGH (ref 2.3–4.6)

## 2014-07-30 MED ORDER — BISACODYL 10 MG RE SUPP
10.0000 mg | Freq: Every day | RECTAL | Status: DC
  Filled 2014-07-30: qty 1

## 2014-07-30 MED ORDER — HYDROCODONE-ACETAMINOPHEN 5-325 MG PO TABS
1.0000 | ORAL_TABLET | ORAL | Status: DC | PRN
  Administered 2014-07-30 – 2014-07-31 (×2): 2 via ORAL
  Filled 2014-07-30 (×2): qty 2

## 2014-07-30 MED ORDER — HYDRALAZINE HCL 50 MG PO TABS
100.0000 mg | ORAL_TABLET | Freq: Three times a day (TID) | ORAL | Status: DC
  Administered 2014-07-30 – 2014-07-31 (×6): 100 mg via ORAL
  Filled 2014-07-30 (×10): qty 2

## 2014-07-30 MED ORDER — GABAPENTIN 100 MG PO CAPS
200.0000 mg | ORAL_CAPSULE | Freq: Three times a day (TID) | ORAL | Status: DC
  Administered 2014-07-30 – 2014-07-31 (×4): 200 mg via ORAL
  Filled 2014-07-30 (×6): qty 2

## 2014-07-30 MED ORDER — HYDRALAZINE HCL 20 MG/ML IJ SOLN
10.0000 mg | INTRAMUSCULAR | Status: DC | PRN
Start: 2014-07-30 — End: 2014-07-31
  Filled 2014-07-30: qty 1

## 2014-07-30 MED ORDER — CLONAZEPAM 0.5 MG PO TABS
0.5000 mg | ORAL_TABLET | Freq: Every day | ORAL | Status: DC
  Administered 2014-07-30 (×2): 0.5 mg via ORAL
  Filled 2014-07-30 (×2): qty 1

## 2014-07-30 MED ORDER — ATORVASTATIN CALCIUM 20 MG PO TABS
20.0000 mg | ORAL_TABLET | Freq: Every day | ORAL | Status: DC
  Administered 2014-07-30: 20 mg via ORAL
  Filled 2014-07-30: qty 2
  Filled 2014-07-30: qty 1

## 2014-07-30 MED ORDER — ACETAMINOPHEN 650 MG RE SUPP: 650.0000 mg | Freq: Four times a day (QID) | RECTAL | Status: DC | PRN

## 2014-07-30 MED ORDER — OXYCODONE HCL 5 MG PO TABS
10.0000 mg | ORAL_TABLET | Freq: Four times a day (QID) | ORAL | Status: DC | PRN
  Administered 2014-07-30 (×3): 15 mg via ORAL
  Filled 2014-07-30 (×3): qty 3

## 2014-07-30 MED ORDER — TRAMADOL HCL 50 MG PO TABS
50.0000 mg | ORAL_TABLET | Freq: Four times a day (QID) | ORAL | Status: DC | PRN
  Administered 2014-07-30: 50 mg via ORAL
  Filled 2014-07-30: qty 1

## 2014-07-30 MED ORDER — ONDANSETRON HCL 4 MG PO TABS: 4.0000 mg | ORAL_TABLET | Freq: Four times a day (QID) | ORAL | Status: DC | PRN

## 2014-07-30 MED ORDER — ONDANSETRON HCL 4 MG/2ML IJ SOLN
4.0000 mg | Freq: Four times a day (QID) | INTRAMUSCULAR | Status: DC | PRN
Start: 2014-07-30 — End: 2014-07-31

## 2014-07-30 MED ORDER — METHOCARBAMOL 500 MG PO TABS
500.0000 mg | ORAL_TABLET | Freq: Four times a day (QID) | ORAL | Status: DC | PRN
  Administered 2014-07-30 (×2): 500 mg via ORAL
  Filled 2014-07-30 (×2): qty 1

## 2014-07-30 MED ORDER — FAMOTIDINE 20 MG PO TABS
20.0000 mg | ORAL_TABLET | Freq: Two times a day (BID) | ORAL | Status: DC
  Administered 2014-07-30 – 2014-07-31 (×4): 20 mg via ORAL
  Filled 2014-07-30 (×5): qty 1

## 2014-07-30 MED ORDER — ENSURE COMPLETE PO LIQD
237.0000 mL | Freq: Three times a day (TID) | ORAL | Status: DC
  Administered 2014-07-30 – 2014-07-31 (×4): 237 mL via ORAL

## 2014-07-30 MED ORDER — POLYETHYLENE GLYCOL 3350 17 G PO PACK
17.0000 g | PACK | Freq: Every day | ORAL | Status: DC
  Administered 2014-07-30: 17 g via ORAL
  Filled 2014-07-30 (×2): qty 1

## 2014-07-30 MED ORDER — ACETAMINOPHEN 325 MG PO TABS: 650.0000 mg | ORAL_TABLET | Freq: Four times a day (QID) | ORAL | Status: DC | PRN

## 2014-07-30 MED ORDER — DOCUSATE SODIUM 100 MG PO CAPS
100.0000 mg | ORAL_CAPSULE | Freq: Two times a day (BID) | ORAL | Status: DC
  Administered 2014-07-30 (×3): 100 mg via ORAL
  Filled 2014-07-30 (×5): qty 1

## 2014-07-30 MED ORDER — ZOLPIDEM TARTRATE 5 MG PO TABS
5.0000 mg | ORAL_TABLET | Freq: Once | ORAL | Status: AC
  Administered 2014-07-30: 5 mg via ORAL
  Filled 2014-07-30: qty 1

## 2014-07-30 MED ORDER — SODIUM CHLORIDE 0.9 % IJ SOLN
3.0000 mL | Freq: Two times a day (BID) | INTRAMUSCULAR | Status: DC
  Administered 2014-07-30 – 2014-07-31 (×3): 3 mL via INTRAVENOUS

## 2014-07-30 MED ORDER — LABETALOL HCL 300 MG PO TABS
300.0000 mg | ORAL_TABLET | Freq: Two times a day (BID) | ORAL | Status: DC
  Administered 2014-07-30 – 2014-07-31 (×3): 300 mg via ORAL
  Filled 2014-07-30 (×7): qty 1

## 2014-07-30 MED ORDER — SERTRALINE HCL 100 MG PO TABS
100.0000 mg | ORAL_TABLET | Freq: Every day | ORAL | Status: DC
  Administered 2014-07-30 – 2014-07-31 (×2): 100 mg via ORAL
  Filled 2014-07-30 (×2): qty 1

## 2014-07-30 MED ORDER — ALPRAZOLAM 0.5 MG PO TABS
0.5000 mg | ORAL_TABLET | Freq: Three times a day (TID) | ORAL | Status: DC | PRN
  Administered 2014-07-30 (×3): 0.5 mg via ORAL
  Filled 2014-07-30 (×3): qty 1

## 2014-07-30 NOTE — Progress Notes (Signed)
INITIAL NUTRITION ASSESSMENT  DOCUMENTATION CODES Per approved criteria  -Not Applicable   INTERVENTION: - Ensure Complete BID - Encouraged increased meal intake - RD to continue to monitor   NUTRITION DIAGNOSIS: Increased nutrient needs related to unintended weight loss as evidenced by weigh trend.   Goal: Pt to consume >90% of meals/supplements  Monitor:  Weights, labs, intake  Reason for Assessment: Malnutrition screening tool   48 y.o. female  Admitting Dx: Anxiety   ASSESSMENT: Pt with hx of HTN, anemia, depression, GERD, stroke, dissecting aneurysm of thoracic aorta, Stanford type B, polycystic kidney disease, paraparesis of lower extremity due to spinal cord ischemia, arthritis, bipolar disorder, and anxiety. 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 Patient was discharged 4 days ago and since arrival to home have been having generalized pain in chest, abdomen and lower legs as well as frequent anxiety attacks. Patient yesterday developed an episode of sudden onset of shortness of breath chest pain which he describes as sharp, gasping for air.  - Met with pt who reports eating well PTA, 3 meals/day - Usually eats eggs/bacon, sandwich with fruit for lunch, and a hot meal for dinner - Drinks 2 Ensure/day - Weight down 5 pounds in the past month - Said she ate 50% of meals this morning - Requested Ensure during admission, will order   Alk phos, AST/ALT elevated   Height: Ht Readings from Last 1 Encounters:  07/30/14 5' 6"  (1.676 m)    Weight: Wt Readings from Last 1 Encounters:  07/30/14 141 lb 15.6 oz (64.4 kg)    Ideal Body Weight: 130 lbs   % Ideal Body Weight: 108%  Wt Readings from Last 10 Encounters:  07/30/14 141 lb 15.6 oz (64.4 kg)  07/24/14 140 lb 4.8 oz (63.64 kg)  07/07/14 146 lb 12.8 oz (66.588 kg)  02/09/11 131 lb 8 oz (59.648 kg)    Usual Body Weight: 146 lbs last  month  % Usual Body Weight: 96%  BMI:  Body mass index is 22.93 kg/(m^2).  Estimated Nutritional Needs: Kcal: 1700-1900 Protein: 75-90g Fluid: 1.7-1.9L/day   Skin: intact   Diet Order: Cardiac  EDUCATION NEEDS: -No education needs identified at this time   Intake/Output Summary (Last 24 hours) at 07/30/14 1335 Last data filed at 07/30/14 1030  Gross per 24 hour  Intake      0 ml  Output    450 ml  Net   -450 ml    Last BM: PTA  Labs:   Recent Labs Lab 07/24/14 1000 07/29/14 1622 07/29/14 1639 07/30/14 0640  NA 137 142 140 139  K 3.6* 4.2 4.0 3.8  CL 98 103 108 103  CO2 26 21  --  23  BUN 21 28* 26* 23  CREATININE 1.33* 1.55* 1.70* 1.40*  CALCIUM 9.6 10.2  --  9.8  MG  --   --   --  2.0  PHOS  --   --   --  5.2*  GLUCOSE 110* 71 73 92    CBG (last 3)  No results found for this basename: GLUCAP,  in the last 72 hours  Scheduled Meds: . atorvastatin  20 mg Oral q1800  . bisacodyl  10 mg Rectal Q0600  . clonazePAM  0.5 mg Oral QHS  . docusate sodium  100 mg Oral BID  . famotidine  20 mg Oral BID  . feeding supplement (ENSURE COMPLETE)  237 mL Oral TID BM  .  gabapentin  200 mg Oral TID  . hydrALAZINE  100 mg Oral 3 times per day  . labetalol  300 mg Oral BID  . nicotine  7 mg Transdermal Once  . polyethylene glycol  17 g Oral QPC supper  . sertraline  100 mg Oral Daily  . sodium chloride  3 mL Intravenous Q12H    Continuous Infusions:   Past Medical History  Diagnosis Date  . Hypertension   . Family history of anesthesia complication     "alot of back pains from the epidurals"  . Complication of anesthesia     "alot of back pains from the epidurals"  . Anemia   . Depression   . GERD (gastroesophageal reflux disease)   . Daily headache   . Migraine     "sometimes twice/day" (07/05/2014)  . Stroke 06/29/2014    residual BLE weakness  . Dissecting aneurysm of thoracic aorta, Stanford type B 06/29/2014    Archie Endo 07/05/2014  . Polycystic kidney  disease     /notes 07/05/2014  . 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 Arthritis     "shoulders" (07/05/2014)  . Bipolar disorder   . Anxiety     Past Surgical History  Procedure Laterality Date  . Cesarean section  1986  . Tubal ligation  1986  . Spinal drain placement  06/29/2014  . Spinal drain removal  07/02/2014  . Thoracentesis  07/05/2014    Archie Endo 07/05/2014    Carlis Stable MS, RD, LDN [phone] Pager [phone] Weekend/After Hours Pager

## 2014-07-30 NOTE — Progress Notes (Signed)
      [address]       [place] [address]             [phone]             Subjective: Patient denies chest pain/heaviness or shortness of breath at this time. She does state she has some cramping of her legs this am.  Objective: Vital signs in last 24 hours: Temp:  [97.7 F (36.5 C)-98.5 F (36.9 C)] 98.5 F (36.9 C) (09/08 0800) Pulse Rate:  [46-75] 69 (09/08 0400) Cardiac Rhythm:  [-] Sinus bradycardia;Normal sinus rhythm (09/08 0804) Resp:  [13-59] 22 (09/08 0400) BP: (113-208)/(59-113) 146/81 mmHg (09/08 0647) SpO2:  [91 %-100 %] 99 % (09/08 0000) Weight:  [141 lb 15.6 oz (64.4 kg)] 141 lb 15.6 oz (64.4 kg) (09/08 0000)   Current Weight  07/30/14 141 lb 15.6 oz (64.4 kg)        Physical Exam:  Cardiovascular: RRR, no murmurs, gallops, or rubs. Pulmonary: Clear to auscultation bilaterally; no rales, wheezes, or rhonchi. Abdomen: Soft, non tender, bowel sounds present. Extremities: No lower extremity edema. Bilateral lower extremities warm and pulses intact bilaterally. Able to move RLE more so than LLE (stable from previously)   Lab Results: CBC: Recent Labs  07/29/14 1622 07/29/14 1639 07/30/14 0640  WBC 5.5  --  4.0  HGB 11.1* 12.6 11.0*  HCT 34.5* 37.0 34.3*  PLT 382  --  336   BMET:  Recent Labs  07/29/14 1622 07/29/14 1639 07/30/14 0640  NA 142 140 139  K 4.2 4.0 3.8  CL 103 108 103  CO2 21  --  23  GLUCOSE 71 73 92  BUN 28* 26* 23  CREATININE 1.55* 1.70* 1.40*  CALCIUM 10.2  --  9.8    PT/INR:  Lab Results  Component Value Date   INR 1.05 07/29/2014   INR 1.10 06/30/2014   INR 1.0 12/02/2007   ABG:  INR: Will add last result for INR, ABG once components are confirmed Will add last 4 CBG results once components are confirmed  Assessment/Plan:  1. 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 CT done yesterday stable from previously. Uncontrolled hypertension. SR in the 80's. SBP in the  140-180's last 24 hours. On Hydralazine 100 tid and Labetalol 300 bid. Need strict control of BP. May need additional medicine for better control. 2. H and H stable at 11 and 34.3 3. Creatinine down from 1.7 to 1.4 (at baseline)  ZIMMERMAN,DONIELLE MPA-C 07/30/2014,9:37 AM

## 2014-07-30 NOTE — Progress Notes (Signed)
[facility]  Patient Status: Active (receiving services up to time of hospitalization)  [facility] is providing the following services: RN  If patient discharges after hours, please call [phone].   Charlotte [name] 07/30/2014, 10:41 AM

## 2014-07-30 NOTE — Progress Notes (Signed)
Patient ID: Charlotte [name], female   DOB: 01-Dec-1965, 48 y.o.   MRN: [id number]  [facility] PROGRESS NOTE  Charlotte [name] XBJ:[id number] DOB: 1966-10-31 DOA: 07/29/2014 PCP: Willey Blade, MD  Brief narrative: 48 y.o. 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 ED with main concern of several days duration of progressively worsening chest pain, abdominal pain, lower extremity pain.   CT of the chest and Abdomen showed stable dissection with small area of contrast opacification in the false lumen do not appear flow limiting. ER discussed with CT surgery who recomended to admit patient to Medicine for blood pressure control.   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 It remains nearly completely thrombosed, however there is a small area of contrast opacification in the false lumen in the descending  aorta. The great vessels remain patent.  Within the abdomen, there are 2 focal areas of contrast opacification in the false lumen, along the posterior aorta. These focal dissections do not appear flow limiting. Visceral vasculature remains patent without evidence of dissection flap.  Stable appearance of the organs otherwise.  Dg Chest Port 1 View  07/29/2014  No active disease.    Ct Angio Chest Aortic Dissect W &/or W/o  07/29/2014  Within the thorax, the aortic dissection is again noted. It remains nearly completely thrombosed, however there is a small area of contrast opacification in the false lumen in the descending aorta. The great vessels remain patent.  Within the abdomen, there are 2 focal areas of contrast opacification in the false lumen, along the posterior aorta. These focal dissections do not appear flow limiting. Visceral vasculature remains patent without evidence of dissection flap.  Stable appearance of the organs otherwise.    Medical Consultants:   Cardiothoracic surgery  Other Consultants:   Physical therapy  Anti-Infectives:   None   Debbora Presto, MD  [facility] Pager [phone]  If 7PM-7AM, please contact night-coverage [email or web address] Password [facility] 07/30/2014, 8:35 AM   LOS: 1 day   HPI/Subjective: No events overnight.   Objective: Filed Vitals:   07/30/14 0032 07/30/14 0300 07/30/14 0400 07/30/14 0647  BP: 187/103 113/59 119/70 146/81  Pulse:  75 69   Temp:      TempSrc:      Resp:  13 22   Height:      Weight:      SpO2:  No intake or output data in the 24 hours ending 07/30/14 0835  Exam:   General:  Pt is alert, follows commands appropriately, not in acute distress  Cardiovascular: Regular rate and rhythm, no rubs, no gallops  Respiratory: Clear to auscultation bilaterally, no wheezing, no crackles, no rhonchi  Abdomen: Soft, non tender, non distended, bowel sounds present, no guarding  Data Reviewed: Basic Metabolic Panel:  Recent Labs Lab  07/24/14 1000 07/29/14 1622 07/29/14 1639 07/30/14 0640  NA 137 142 140 139  K 3.6* 4.2 4.0 3.8  CL 98 103 108 103  CO2 26 21  --  23  GLUCOSE 110* 71 73 92  BUN 21 28* 26* 23  CREATININE 1.33* 1.55* 1.70* 1.40*  CALCIUM 9.6 10.2  --  9.8  MG  --   --   --  2.0  PHOS  --   --   --  5.2*   Liver Function Tests:  Recent Labs Lab 07/29/14 1622 07/30/14 0640  AST 50* 40*  ALT 50* 39*  ALKPHOS 146* 129*  BILITOT 0.4 0.4  PROT 7.9 7.2  ALBUMIN 3.3* 2.9*   CBC:  Recent Labs Lab 07/29/14 1622 07/29/14 1639 07/30/14 0640  WBC 5.5  --  4.0  NEUTROABS 2.9  --   --   HGB 11.1* 12.6 11.0*  HCT 34.5* 37.0 34.3*  MCV 83.5  --  85.3  PLT 382  --  336   Cardiac Enzymes:  Recent Labs Lab 07/30/14 0040 07/30/14 0640  TROPONINI <0.30 <0.30    Recent Results (from the past 240 hour(s))  MRSA PCR SCREENING     Status: None   Collection Time    07/30/14 12:55 AM      Result Value Ref Range Status   MRSA by PCR NEGATIVE  NEGATIVE Final   Comment:            The GeneXpert MRSA Assay (FDA     approved for NASAL specimens     only), is one component of a     comprehensive MRSA colonization     surveillance program. It is not     intended to diagnose MRSA     infection nor to guide or     monitor treatment for     MRSA infections.     Scheduled Meds: . atorvastatin  20 mg Oral q1800  . bisacodyl  10 mg Rectal Q0600  . clonazePAM  0.5 mg Oral QHS  . docusate sodium  100 mg Oral BID  . famotidine  20 mg Oral BID  . feeding supplement (ENSURE COMPLETE)  237 mL Oral TID BM  . gabapentin  200 mg Oral TID  . hydrALAZINE  100 mg Oral 3 times per day  . labetalol  300 mg Oral BID  . nicotine  7 mg Transdermal Once  . polyethylene glycol  17 g Oral QPC supper  . sertraline  100 mg Oral Daily  . sodium chloride  3 mL Intravenous Q12H   Continuous Infusions:

## 2014-07-31 LAB — CBC
HEMATOCRIT: 32.2 % — AB (ref 36.0–46.0)
Hemoglobin: 10.4 g/dL — ABNORMAL LOW (ref 12.0–15.0)
MCH: 27.5 pg (ref 26.0–34.0)
MCHC: 32.3 g/dL (ref 30.0–36.0)
MCV: 85.2 fL (ref 78.0–100.0)
Platelets: 316 10*3/uL (ref 150–400)
RBC: 3.78 MIL/uL — ABNORMAL LOW (ref 3.87–5.11)
RDW: 14.1 % (ref 11.5–15.5)
WBC: 4.5 10*3/uL (ref 4.0–10.5)

## 2014-07-31 LAB — COMPREHENSIVE METABOLIC PANEL
ALBUMIN: 2.8 g/dL — AB (ref 3.5–5.2)
ALT: 34 U/L (ref 0–35)
ANION GAP: 13 (ref 5–15)
AST: 34 U/L (ref 0–37)
Alkaline Phosphatase: 120 U/L — ABNORMAL HIGH (ref 39–117)
BILIRUBIN TOTAL: 0.3 mg/dL (ref 0.3–1.2)
BUN: 24 mg/dL — AB (ref 6–23)
CO2: 24 mEq/L (ref 19–32)
CREATININE: 1.37 mg/dL — AB (ref 0.50–1.10)
Calcium: 9.4 mg/dL (ref 8.4–10.5)
Chloride: 101 mEq/L (ref 96–112)
GFR calc Af Amer: 51 mL/min — ABNORMAL LOW (ref >90)
GFR, EST NON AFRICAN AMERICAN: 44 mL/min — AB (ref >90)
Glucose, Bld: 109 mg/dL — ABNORMAL HIGH (ref 70–99)
Potassium: 3.3 mEq/L — ABNORMAL LOW (ref 3.7–5.3)
Sodium: 138 mEq/L (ref 137–147)
Total Protein: 6.8 g/dL (ref 6.0–8.3)

## 2014-07-31 NOTE — Discharge Summary (Signed)
[facility]  Physician Discharge Summary   Patient ID: Charlotte [name] MRN: [id number] DOB/AGE: 50-Mar-1967 50 y.o.  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 Needs good BP control due to dissection.  2. Home health RN and aide  DISCHARGE CONDITION: fair  Diet recommendation: Heart Healthy  Filed Weights   07/30/14 0000  Weight: 64.4 kg (141 lb 15.6 oz)    INITIAL HISTORY: 48 y.o. 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 CT of the chest and Abdomen showed stable dissection with small area of contrast opacification in the false lumen do not appear flow limiting. She was also noted to have significantly elevated BP.  Consultations:  CTS  Procedures:  None  HOSPITAL COURSE:   HTN (hypertension), malignant  This significantly improved since admission. Patient and daughter states that patient has been compliant with her home medications. Unclear why her BP was so high on admission. Anxiety/pain could have contributed. Should continue current home medications including HCTZ, Hydralazine and Labetalol.   Dissecting aneurysm of thoracic aorta, Stanford type B  This is stable. Seen by cardiothoracic surgery and no plans for surgical intervention.   Spinal  cord ischemia causing lower extremity paraparesis  She is wheelchair bound.   Chronic renal insufficiency, stage III (moderate)  Stable  Anxiety  Stable, continue Xanax.   Transaminitis  Resolved. Unclear etiology.  Overall she is stable and back to baseline. Anxiety could be contributing. She is on Zoloft and Alprazolam which should help. Discussed with daughter. She is stable for discharge. Home health Rn and aide arranged. BP to be monitored closely at home.  PERTINENT LABS:  The results of significant diagnostics from this hospitalization (including imaging, microbiology, ancillary and laboratory) are listed below for reference.    Microbiology: Recent Results (from the past 240 hour(s))  MRSA PCR SCREENING     Status: None   Collection Time    07/30/14 12:55 AM      Result Value Ref Range Status   MRSA by PCR NEGATIVE  NEGATIVE Final   Comment:            The GeneXpert MRSA Assay (FDA     approved for NASAL specimens     only), is one component of a     comprehensive MRSA colonization     surveillance program. It is not     intended to diagnose MRSA     infection nor to guide or     monitor treatment for     MRSA infections.     Labs: Basic Metabolic Panel:  Recent Labs Lab 07/29/14 1622 07/29/14 1639 07/30/14 0640 07/31/14 0409  NA 142 140 139 138  K 4.2 4.0 3.8 3.3*  CL 103 108 103 101  CO2 21  --  23 24  GLUCOSE 71 73 92 109*  BUN 28* 26*  23 24*  CREATININE 1.55* 1.70* 1.40* 1.37*  CALCIUM 10.2  --  9.8 9.4  MG  --   --  2.0  --   PHOS  --   --  5.2*  --    Liver Function Tests:  Recent Labs Lab 07/29/14 1622 07/30/14 0640 07/31/14 0409  AST 50* 40* 34  ALT 50* 39* 34  ALKPHOS 146* 129* 120*  BILITOT 0.4 0.4 0.3  PROT 7.9 7.2 6.8  ALBUMIN 3.3* 2.9* 2.8*   CBC:  Recent Labs Lab 07/29/14 1622 07/29/14 1639 07/30/14 0640 07/31/14 0409  WBC 5.5  --  4.0 4.5  NEUTROABS 2.9  --   --   --   HGB 11.1* 12.6 11.0* 10.4*  HCT 34.5* 37.0  34.3* 32.2*  MCV 83.5  --  85.3 85.2  PLT 382  --  336 316   Cardiac Enzymes:  Recent Labs Lab 07/30/14 0040 07/30/14 0640 07/30/14 1200  TROPONINI <0.30 <0.30 <0.30     IMAGING STUDIES  Ct Angio Abdomen W/cm &/or Wo Contrast  07/29/2014   CLINICAL DATA:  Short of breath and pain  EXAM: CT ANGIOGRAPHY CHEST AND ABDOMEN  TECHNIQUE: Multidetector CT imaging of the chest and abdomen was performed using the standard protocol during bolus administration of intravenous contrast. Multiplanar CT image reconstructions and MIPs were obtained to evaluate the vascular anatomy.  CONTRAST:  OMNIPAQUE IOHEXOL 350 MG/ML SOLN  COMPARISON:  07/04/2014  FINDINGS: CTA CHEST FINDINGS  Intramural hematoma beginning beyond the takeoff of the LEs subclavian artery and extending into the lower thoracic aorta is stable. There is a small area of contrast opacification of the false lumen in the descending aorta. See image 69 of series 9. Great vessels are patent. Vertebral and carotid arteries are patent within the confines of the exam. No obvious acute pulmonary thromboembolism. No abnormal mediastinal adenopathy or pericardial effusion. Dependent atelectasis No pneumothorax. Tiny pleural effusions.  Review of the MIP images confirms the above findings.  CTA ABDOMEN FINDINGS  The aortic dissection has slightly progressed since the prior study. There are areas of contrast opacification of the false lumen. False lumen opacifies posteriorly on image 107 of series 9 as well as 114 of series 9. These focal dissections do not appear flow limiting.  Poor contrast opacification below the aortic bifurcation. There is no obvious dissection in the iliac vasculature. External and common iliac arteries are patent. Internal iliac arteries are patent.  Atherosclerotic changes of the single bilateral renal arteries without significant focal narrowing. Celiac narrowing is stable. SMA is patent. Branch vessels of the celiac and SMA are  grossly patent. IMA is patent. Branch vessels are patent. Accessory left hepatic artery anatomy is present.  Innumerable cysts in the kidneys in liver are stable.  Spleen, pancreas, adrenal glands are within normal limits. Gallbladder is unremarkable.  Uterus is lobulated worrisome for fibroids. Bladder is unremarkable. Normal appendix.  No free-fluid.  Review of the MIP images confirms the above findings.  IMPRESSION: Within the thorax, the aortic dissection is again noted. It remains nearly completely thrombosed, however there is a small area of contrast opacification in the false lumen in the descending aorta. The great vessels remain patent.  Within the abdomen, there are 2 focal areas of contrast opacification in the false lumen, along the posterior aorta. These focal dissections do not appear flow limiting. Visceral vasculature remains patent without evidence of dissection flap.  Stable appearance of the organs otherwise.   Electronically Signed  By: Maryclare Bean M.D.   On: 07/29/2014 20:09   Dg Chest Port 1 View  07/29/2014   CLINICAL DATA:  Shortness of breath.  Hyperventilation.  EXAM: PORTABLE CHEST - 1 VIEW  COMPARISON:  07/14/2014  FINDINGS: The heart size and mediastinal contours are within normal limits. Both lungs are clear. No evidence of pneumothorax or pleural effusion. The visualized skeletal structures are unremarkable.  IMPRESSION: No active disease.   Electronically Signed   By: Myles Rosenthal M.D.   On: 07/29/2014 16:17   Ct Angio Chest Aortic Dissect W &/or W/o  07/29/2014   CLINICAL DATA:  Short of breath and pain  EXAM: CT ANGIOGRAPHY CHEST AND ABDOMEN  TECHNIQUE: Multidetector CT imaging of the chest and abdomen was performed using the standard protocol during bolus administration of intravenous contrast. Multiplanar CT image reconstructions and MIPs were obtained to evaluate the vascular anatomy.  CONTRAST:  OMNIPAQUE IOHEXOL 350 MG/ML SOLN  COMPARISON:  07/04/2014  FINDINGS: CTA  CHEST FINDINGS  Intramural hematoma beginning beyond the takeoff of the LEs subclavian artery and extending into the lower thoracic aorta is stable. There is a small area of contrast opacification of the false lumen in the descending aorta. See image 69 of series 9. Great vessels are patent. Vertebral and carotid arteries are patent within the confines of the exam. No obvious acute pulmonary thromboembolism. No abnormal mediastinal adenopathy or pericardial effusion. Dependent atelectasis No pneumothorax. Tiny pleural effusions.  Review of the MIP images confirms the above findings.  CTA ABDOMEN FINDINGS  The aortic dissection has slightly progressed since the prior study. There are areas of contrast opacification of the false lumen. False lumen opacifies posteriorly on image 107 of series 9 as well as 114 of series 9. These focal dissections do not appear flow limiting.  Poor contrast opacification below the aortic bifurcation. There is no obvious dissection in the iliac vasculature. External and common iliac arteries are patent. Internal iliac arteries are patent.  Atherosclerotic changes of the single bilateral renal arteries without significant focal narrowing. Celiac narrowing is stable. SMA is patent. Branch vessels of the celiac and SMA are grossly patent. IMA is patent. Branch vessels are patent. Accessory left hepatic artery anatomy is present.  Innumerable cysts in the kidneys in liver are stable.  Spleen, pancreas, adrenal glands are within normal limits. Gallbladder is unremarkable.  Uterus is lobulated worrisome for fibroids. Bladder is unremarkable. Normal appendix.  No free-fluid.  Review of the MIP images confirms the above findings.  IMPRESSION: Within the thorax, the aortic dissection is again noted. It remains nearly completely thrombosed, however there is a small area of contrast opacification in the false lumen in the descending aorta. The great vessels remain patent.  Within the abdomen, there  are 2 focal areas of contrast opacification in the false lumen, along the posterior aorta. These focal dissections do not appear flow limiting. Visceral vasculature remains patent without evidence of dissection flap.  Stable appearance of the organs otherwise.   Electronically Signed   By: Maryclare Bean M.D.   On: 07/29/2014 20:09    DISCHARGE EXAMINATION: Filed Vitals:   07/31/14 0021 07/31/14 0242 07/31/14 0502 07/31/14 1049  BP: 134/76 126/75 148/78 148/75  Pulse:  72 75 69  Temp:  98.1 F (36.7 C) 97.7 F (36.5 C)   TempSrc:  Oral Oral   Resp:  16 16   Height:      Weight:      SpO2:  98% 97%  General appearance: alert, cooperative and no distress Resp: clear to auscultation bilaterally Cardio: regular rate and rhythm, S1, S2 normal, no murmur, click, rub or gallop GI: soft, non-tender; bowel sounds normal; no masses,  no organomegaly  DISPOSITION: Home with daughter  Discharge Instructions   Diet - low sodium heart healthy    Complete by:  As directed      Discharge instructions    Complete by:  As directed   Need good BP control. Take medications as prescribed.           ALLERGIES:  Allergies  Allergen Reactions  . Seroquel [Quetiapine] Other (See Comments)    Night sweats, dizziness and possible confusion  . Olanzapine Nausea And Vomiting    Current Discharge Medication List    CONTINUE these medications which have NOT CHANGED   Details  acetaminophen (TYLENOL) 325 MG tablet Take 1-2 tablets (325-650 mg total) by mouth every 4 (four) hours as needed for mild pain.    ALPRAZolam (XANAX) 0.25 MG tablet Take 1 tablet (0.25 mg total) by mouth 3 (three) times daily as needed for anxiety. Qty: 30 tablet, Refills: 0    atorvastatin (LIPITOR) 20 MG tablet Take 1 tablet (20 mg total) by mouth daily at 6 PM. Qty: 30 tablet, Refills: 1    famotidine (PEPCID) 20 MG tablet Take 1 tablet (20 mg total) by mouth 2 (two) times daily. Qty: 60 tablet, Refills: 1      gabapentin (NEURONTIN) 100 MG capsule Take 2 capsules (200 mg total) by mouth 3 (three) times daily. Qty: 180 capsule, Refills: 1    hydrALAZINE (APRESOLINE) 100 MG tablet Take 1 tablet (100 mg total) by mouth every 8 (eight) hours. Qty: 90 tablet, Refills: 1    hydrochlorothiazide (MICROZIDE) 12.5 MG capsule Take 1 capsule (12.5 mg total) by mouth daily. Qty: 30 capsule, Refills: 1    labetalol (NORMODYNE) 300 MG tablet Take 1 tablet (300 mg total) by mouth 2 (two) times daily. Qty: 60 tablet, Refills: 1    methocarbamol (ROBAXIN) 500 MG tablet Take 1 tablet (500 mg total) by mouth every 6 (six) hours as needed for muscle spasms. Qty: 60 tablet, Refills: 0    polyethylene glycol (MIRALAX / GLYCOLAX) packet Take 17 g by mouth daily after supper. Qty: 30 each, Refills: 0    potassium chloride SA (K-DUR,KLOR-CON) 20 MEQ tablet Take 1 tablet (20 mEq total) by mouth 2 (two) times daily. Qty: 60 tablet, Refills: 1    sertraline (ZOLOFT) 100 MG tablet Take 1 tablet (100 mg total) by mouth daily. Qty: 30 tablet, Refills: 1    traMADol (ULTRAM) 50 MG tablet Take 1 tablet (50 mg total) by mouth every 6 (six) hours as needed for moderate pain. Qty: 45 tablet, Refills: 0    bisacodyl (DULCOLAX) 10 MG suppository Place 1 suppository (10 mg total) rectally daily at 6 (six) AM. For bowel program. Qty: 30 suppository, Refills: 0    feeding supplement, ENSURE COMPLETE, (ENSURE COMPLETE) LIQD Take 237 mLs by mouth 3 (three) times daily between meals.    nicotine (NICODERM CQ - DOSED IN MG/24 HOURS) 14 mg/24hr patch Place 1 patch (14 mg total) onto the skin daily. Qty: 28 patch, Refills: 0    oxyCODONE 10 MG TABS Take 1-1.5 tablets (10-15 mg total) by mouth every 6 (six) hours as needed for severe pain. Qty: 100 tablet, Refills: 0       Follow-up Information   Follow up with August Saucer, ERIC, MD. (as  previously scheduled for post hospitalization follow up and BP management)    Specialty:   Internal Medicine   Contact information:   [address] [place] [address] [phone]       Follow up with Cassell Clement, MD On 08/07/2014. (at 4pm)    Specialty:  Cardiology   Contact information:   [address] [place] [address] [phone]       Follow up with Delight Ovens, MD On 09/05/2014. (Appointment time is at 3:30 pm)    Specialty:  Cardiothoracic Surgery   Contact information:   [address] [place] [address] [place] [address] [phone]       TOTAL DISCHARGE TIME: 35 mins  [facility]  [facility] Pager [phone]  07/31/2014, 2:05 PM  Disclaimer: This note was dictated with voice recognition software. Similar sounding words can inadvertently be transcribed and may not be corrected upon review.

## 2014-07-31 NOTE — Progress Notes (Signed)
CARE MANAGEMENT NOTE 07/31/2014  Patient:  Charlotte [name],Charlotte [name]   Account Number:  [id number]  Date Initiated:  07/31/2014  Documentation initiated by:  Parnika Tweten,COOKIE  Subjective/Objective Assessment:   pt admitted with cco Spinal Cords Ischemia     Action/Plan:   home   Anticipated DC Date:  07/31/2014   Anticipated DC Plan:  HOME W HOME HEALTH SERVICES      DC Planning Services  CM consult      Choice offered to / List presented to:  C-4 Adult Children        HH arranged  HH-1 RN  HH-4 NURSE'S AIDE      HH agency  [facility]   Status of service:  In process, will continue to follow Medicare Important Message given?  NA - LOS <3 / Initial given by admissions (If response is "NO", the following Medicare IM given date fields will be blank) Date Medicare IM given:   Medicare IM given by:   Date Additional Medicare IM given:   Additional Medicare IM given by:    Discharge Disposition:  HOME W HOME HEALTH SERVICES  Per UR Regulation:  Reviewed for med. necessity/level of care/duration of stay  If discussed at Long Length of Stay Meetings, dates discussed:    Comments:  07/31/14 MMcGibboney, RN, BSN Spoke with pt's daughter [name] [phone], concerning discharge plan and medications. She states she ia able to purchase pt's medications, but can not afford pain medications. There are no fundings or support at present time that would pay for pain medications. This was explained to pt's daughter.  Also she asked for HHRN/NA for pt. She is active with [facility]. Will need HH orders for HHRN/NA.

## 2014-07-31 NOTE — Discharge Instructions (Signed)
Follow up with your PCP and cardiologist as previously scheduled.  Hypertension Hypertension, commonly called high blood pressure, is when the force of blood pumping through your arteries is too strong. Your arteries are the blood vessels that carry blood from your heart throughout your body. A blood pressure reading consists of a higher number over a lower number, such as 110/72. The higher number (systolic) is the pressure inside your arteries when your heart pumps. The lower number (diastolic) is the pressure inside your arteries when your heart relaxes. Ideally you want your blood pressure below 120/80. Hypertension forces your heart to work harder to pump blood. Your arteries may become narrow or stiff. Having hypertension puts you at risk for heart disease, stroke, and other problems.  RISK FACTORS Some risk factors for high blood pressure are controllable. Others are not.  Risk factors you cannot control include:   Race. You may be at higher risk if you are African American.  Age. Risk increases with age.  Gender. Men are at higher risk than women before age 30 years. After age 73, women are at higher risk than men. Risk factors you can control include:  Not getting enough exercise or physical activity.  Being overweight.  Getting too much fat, sugar, calories, or salt in your diet.  Drinking too much alcohol. SIGNS AND SYMPTOMS Hypertension does not usually cause signs or symptoms. Extremely high blood pressure (hypertensive crisis) may cause headache, anxiety, shortness of breath, and nosebleed. DIAGNOSIS  To check if you have hypertension, your health care provider will measure your blood pressure while you are seated, with your arm held at the level of your heart. It should be measured at least twice using the same arm. Certain conditions can cause a difference in blood pressure between your right and left arms. A blood pressure reading that is higher than normal on one occasion  does not mean that you need treatment. If one blood pressure reading is high, ask your health care provider about having it checked again. TREATMENT  Treating high blood pressure includes making lifestyle changes and possibly taking medicine. Living a healthy lifestyle can help lower high blood pressure. You may need to change some of your habits. Lifestyle changes may include:  Following the DASH diet. This diet is high in fruits, vegetables, and whole grains. It is low in salt, red meat, and added sugars.  Getting at least 2 hours of brisk physical activity every week.  Losing weight if necessary.  Not smoking.  Limiting alcoholic beverages.  Learning ways to reduce stress. If lifestyle changes are not enough to get your blood pressure under control, your health care provider may prescribe medicine. You may need to take more than one. Work closely with your health care provider to understand the risks and benefits. HOME CARE INSTRUCTIONS  Have your blood pressure rechecked as directed by your health care provider.   Take medicines only as directed by your health care provider. Follow the directions carefully. Blood pressure medicines must be taken as prescribed. The medicine does not work as well when you skip doses. Skipping doses also puts you at risk for problems.   Do not smoke.   Monitor your blood pressure at home as directed by your health care provider. SEEK MEDICAL CARE IF:   You think you are having a reaction to medicines taken.  You have recurrent headaches or feel dizzy.  You have swelling in your ankles.  You have trouble with your vision. SEEK IMMEDIATE  MEDICAL CARE IF:  You develop a severe headache or confusion.  You have unusual weakness, numbness, or feel faint.  You have severe chest or abdominal pain.  You vomit repeatedly.  You have trouble breathing. MAKE SURE YOU:   Understand these instructions.  Will watch your condition.  Will get  help right away if you are not doing well or get worse. Document Released: 11/08/2005 Document Revised: 03/25/2014 Document Reviewed: 08/31/2013 [facility] Patient Information 2015 [place], [place]. This information is not intended to replace advice given to you by your health care provider. Make sure you discuss any questions you have with your health care provider.

## 2014-08-01 ENCOUNTER — Encounter (HOSPITAL_COMMUNITY): Payer: Self-pay | Admitting: Emergency Medicine

## 2014-08-01 ENCOUNTER — Emergency Department (HOSPITAL_COMMUNITY)
Admission: EM | Admit: 2014-08-01 | Discharge: 2014-08-01 | Disposition: A | Discharge status: Home / Self Care | Payer: Medicaid Other | Attending: Emergency Medicine | Admitting: Emergency Medicine

## 2014-08-01 ENCOUNTER — Emergency Department (HOSPITAL_COMMUNITY): Payer: Medicaid Other

## 2014-08-01 DIAGNOSIS — F411 Generalized anxiety disorder: Secondary | ICD-10-CM | POA: Diagnosis not present

## 2014-08-01 DIAGNOSIS — F419 Anxiety disorder, unspecified: Secondary | ICD-10-CM

## 2014-08-01 DIAGNOSIS — Z8739 Personal history of other diseases of the musculoskeletal system and connective tissue: Secondary | ICD-10-CM | POA: Insufficient documentation

## 2014-08-01 DIAGNOSIS — R079 Chest pain, unspecified: Secondary | ICD-10-CM | POA: Insufficient documentation

## 2014-08-01 DIAGNOSIS — F319 Bipolar disorder, unspecified: Secondary | ICD-10-CM | POA: Insufficient documentation

## 2014-08-01 DIAGNOSIS — Z862 Personal history of diseases of the blood and blood-forming organs and certain disorders involving the immune mechanism: Secondary | ICD-10-CM | POA: Insufficient documentation

## 2014-08-01 DIAGNOSIS — G43909 Migraine, unspecified, not intractable, without status migrainosus: Secondary | ICD-10-CM | POA: Diagnosis not present

## 2014-08-01 DIAGNOSIS — I158 Other secondary hypertension: Secondary | ICD-10-CM | POA: Diagnosis not present

## 2014-08-01 DIAGNOSIS — Z87891 Personal history of nicotine dependence: Secondary | ICD-10-CM | POA: Diagnosis not present

## 2014-08-01 DIAGNOSIS — K219 Gastro-esophageal reflux disease without esophagitis: Secondary | ICD-10-CM | POA: Insufficient documentation

## 2014-08-01 DIAGNOSIS — Z8673 Personal history of transient ischemic attack (TIA), and cerebral infarction without residual deficits: Secondary | ICD-10-CM | POA: Insufficient documentation

## 2014-08-01 DIAGNOSIS — Z79899 Other long term (current) drug therapy: Secondary | ICD-10-CM | POA: Diagnosis not present

## 2014-08-01 DIAGNOSIS — R064 Hyperventilation: Secondary | ICD-10-CM | POA: Insufficient documentation

## 2014-08-01 LAB — CBC WITH DIFFERENTIAL/PLATELET
BASOS ABS: 0 10*3/uL (ref 0.0–0.1)
Basophils Relative: 1 % (ref 0–1)
Eosinophils Absolute: 0.2 10*3/uL (ref 0.0–0.7)
Eosinophils Relative: 4 % (ref 0–5)
HEMATOCRIT: 34.9 % — AB (ref 36.0–46.0)
Hemoglobin: 11.4 g/dL — ABNORMAL LOW (ref 12.0–15.0)
LYMPHS PCT: 30 % (ref 12–46)
Lymphs Abs: 1.3 10*3/uL (ref 0.7–4.0)
MCH: 27.7 pg (ref 26.0–34.0)
MCHC: 32.7 g/dL (ref 30.0–36.0)
MCV: 84.9 fL (ref 78.0–100.0)
MONOS PCT: 7 % (ref 3–12)
Monocytes Absolute: 0.3 10*3/uL (ref 0.1–1.0)
NEUTROS PCT: 58 % (ref 43–77)
Neutro Abs: 2.5 10*3/uL (ref 1.7–7.7)
Platelets: ADEQUATE 10*3/uL (ref 150–400)
RBC: 4.11 MIL/uL (ref 3.87–5.11)
RDW: 14 % (ref 11.5–15.5)
WBC: 4.3 10*3/uL (ref 4.0–10.5)

## 2014-08-01 LAB — I-STAT CHEM 8, ED
BUN: 23 mg/dL (ref 6–23)
CHLORIDE: 108 meq/L (ref 96–112)
Calcium, Ion: 1.25 mmol/L — ABNORMAL HIGH (ref 1.12–1.23)
Creatinine, Ser: 1.3 mg/dL — ABNORMAL HIGH (ref 0.50–1.10)
Glucose, Bld: 115 mg/dL — ABNORMAL HIGH (ref 70–99)
HEMATOCRIT: 37 % (ref 36.0–46.0)
Hemoglobin: 12.6 g/dL (ref 12.0–15.0)
POTASSIUM: 3.2 meq/L — AB (ref 3.7–5.3)
Sodium: 142 mEq/L (ref 137–147)
TCO2: 25 mmol/L (ref 0–100)

## 2014-08-01 LAB — I-STAT TROPONIN, ED: TROPONIN I, POC: 0.02 ng/mL (ref 0.00–0.08)

## 2014-08-01 LAB — PRO B NATRIURETIC PEPTIDE: PRO B NATRI PEPTIDE: 300.2 pg/mL — AB (ref 0–125)

## 2014-08-01 MED ORDER — FENTANYL CITRATE 0.05 MG/ML IJ SOLN
25.0000 ug | Freq: Once | INTRAMUSCULAR | Status: AC
  Administered 2014-08-01: 25 ug via INTRAVENOUS
  Filled 2014-08-01: qty 2

## 2014-08-01 MED ORDER — LORAZEPAM 2 MG/ML IJ SOLN
1.0000 mg | Freq: Once | INTRAMUSCULAR | Status: AC
  Administered 2014-08-01: 1 mg via INTRAVENOUS
  Filled 2014-08-01: qty 1

## 2014-08-01 MED ORDER — ESMOLOL HCL-SODIUM CHLORIDE 2000 MG/100ML IV SOLN
25.0000 ug/kg/min | Freq: Once | INTRAVENOUS | Status: DC
  Filled 2014-08-01: qty 100

## 2014-08-01 MED ORDER — SODIUM CHLORIDE 0.9 % IV BOLUS (SEPSIS)
1000.0000 mL | Freq: Once | INTRAVENOUS | Status: AC
  Administered 2014-08-01: 1000 mL via INTRAVENOUS

## 2014-08-01 MED ORDER — LABETALOL HCL 5 MG/ML IV SOLN
20.0000 mg | Freq: Once | INTRAVENOUS | Status: AC
  Administered 2014-08-01: 20 mg via INTRAVENOUS
  Filled 2014-08-01: qty 4

## 2014-08-01 MED ORDER — HYDROMORPHONE HCL PF 1 MG/ML IJ SOLN
1.0000 mg | Freq: Once | INTRAMUSCULAR | Status: AC
  Administered 2014-08-01: 1 mg via INTRAVENOUS
  Filled 2014-08-01: qty 1

## 2014-08-01 MED ORDER — HYDRALAZINE HCL 20 MG/ML IJ SOLN
20.0000 mg | Freq: Once | INTRAMUSCULAR | Status: AC
  Administered 2014-08-01: 20 mg via INTRAVENOUS
  Filled 2014-08-01: qty 1

## 2014-08-01 MED ORDER — LORAZEPAM 2 MG/ML IJ SOLN: 2.0000 mg | Freq: Once | INTRAMUSCULAR | Status: DC

## 2014-08-01 MED ORDER — IOHEXOL 350 MG/ML SOLN
100.0000 mL | Freq: Once | INTRAVENOUS | Status: AC | PRN
  Administered 2014-08-01: 100 mL via INTRAVENOUS

## 2014-08-01 NOTE — Discharge Planning (Signed)
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 Pt had a follow up appointment this morning with the practice. Representative from the practice instructed this liaison to the inform the pt once discharged to call and reschedule the appointment. Also informed pt and family at bedside of resources and being connected with a [facility] Case Manager. My contact information was provided for any future questions or concerns, will follow up with pt once she is discharged.

## 2014-08-01 NOTE — ED Notes (Signed)
Dr Lozier at bedside  

## 2014-08-01 NOTE — Progress Notes (Signed)
  CARE MANAGEMENT ED NOTE 08/01/2014  Patient:  Charlotte [name],Charlotte [name]   Account Number:  [id number]  Date Initiated:  08/01/2014  Documentation initiated by:  Ferdinand Cava  Subjective/Objective Assessment:   47 yo female presenting to the ED with c/o CP     Subjective/Objective Assessment Detail:     Action/Plan:   Patient will continue to be followed by [facility] HH with addition of SW and PT.   Action/Plan Detail:   Anticipated DC Date:       Status Recommendation to Physician:   Result of Recommendation:  Agreed    DC Planning Services  CM consult  Other    Choice offered to / List presented to:       [facility] arranged  HH-1 RN  HH-2 PT  HH-4 NURSE'S AIDE  HH-6 SOCIAL WORKER      HH agency  [facility]    Status of service:  Completed, signed off  ED Comments:   ED Comments Detail:  This CM spoke with the patient's daughter Charlotte [name] regarding the patient's hospitalizations and follow up [facility] services with [facility]. Charlotte [name] stated that she would like to look into placement for the patient into rehab from the ED. This CM contacted LCSW and she informed this CM that with the previous rehab stay placement would be better handled by [facility] SW from the patient's home since it will be a safe discharge if not admitted. This CM provided Charlotte [name] with the information from the LCSW and explained that [facility] SW can assist with placement concerns. Charlotte [name] stated that [facility] services was initiated on the patient's previous hospitalization at [facility]. This CM spoke with the family regarding services the [facility] SW can assist the patient and family with. Charlotte [name] was appreciative of this. This CM then contacted the [facility] liaison, Miranda, and she recommended placing new orders for [facility] services which this CM did after speaking with EDP, Dr. Juleen China. Confirmed with Tamera Punt that a face to face was not needed. Updated the daughter Charlotte [name] with new orders and provided her with [facility] contact information. Charlotte [name] stated that the patient  is currently residing in her home at [address] [place] [address] and cell is [phone]. Charlotte [name] verbalized understanding and had no other questions or concerns.

## 2014-08-01 NOTE — Discharge Instructions (Signed)

## 2014-08-01 NOTE — ED Provider Notes (Signed)
CSN: [id number]     Arrival date & time 08/01/14  4098 History   First MD Initiated Contact with Patient 08/01/14 1001     Chief Complaint  Patient presents with  . Chest Pain  . Hyperventilating     (Consider location/radiation/quality/duration/timing/severity/associated sxs/prior Treatment) HPI 48 y.o. 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 This started relatively suddenly when she was about to take her morning medication. The pain is in the middle of her chest and sharp. She cannot specific any exacerbating or relieving factors. It is moderate in severity. She lives at home with her daughter. History is limited as there is some cognitive delay. Pain is ongoing. She also reports being anxious.    Past Medical History  Diagnosis Date  . Hypertension   . Family history of anesthesia complication     "alot of back pains from the epidurals"  . Complication of anesthesia     "alot of back pains from the epidurals"  . Anemia   . Depression   . GERD (gastroesophageal reflux disease)   . Daily headache   . Migraine     "sometimes twice/day" (07/05/2014)  . Stroke 06/29/2014    residual BLE weakness  . 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 Polycystic kidney disease     /notes 07/05/2014  . 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 Arthritis     "shoulders" (07/05/2014)  . Bipolar disorder   . Anxiety    Past Surgical History  Procedure Laterality Date  . Cesarean section  1986  . Tubal ligation  1986  . Spinal drain placement  06/29/2014  . Spinal drain removal  07/02/2014  . Thoracentesis  07/05/2014    [name] 07/05/2014   Family History  Problem  Relation Age of Onset  . Stroke Father   . Diabetes type II Other    History  Substance Use Topics  . Smoking status: Former Smoker -- 1.00 packs/day for 37 years    Types: Cigarettes  . Smokeless tobacco: Never Used  . 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     Allergies  Seroquel and Olanzapine  Home Medications   Prior to Admission medications   Medication Sig Start Date End Date Taking? Authorizing Provider  acetaminophen (TYLENOL) 325 MG tablet Take 1-2 tablets (325-650 mg total) by mouth every 4 (four) hours as needed for mild pain. 07/25/14  Yes Evlyn Kanner Love, PA-C  ALPRAZolam (XANAX) 0.25 MG tablet Take 1 tablet (0.25 mg total) by mouth 3 (three) times daily as needed for anxiety. 07/25/14  Yes Evlyn Kanner Love, PA-C  atorvastatin (LIPITOR) 20 MG tablet Take 1 tablet (20 mg total) by mouth daily at 6 PM. 07/25/14  Yes Evlyn Kanner Love, PA-C  bisacodyl (DULCOLAX) 10 MG suppository Place 1 suppository (10 mg total) rectally daily at 6 (six) AM. For bowel program. 07/25/14  Yes Rinaldo Cloud  S Love, PA-C  famotidine (PEPCID) 20 MG tablet Take 1 tablet (20 mg total) by mouth 2 (two) times daily. 07/25/14  Yes Evlyn Kanner Love, PA-C  feeding supplement, ENSURE COMPLETE, (ENSURE COMPLETE) LIQD Take 237 mLs by mouth 3 (three) times daily between meals. 07/08/14  Yes Christiane Ha, MD  gabapentin (NEURONTIN) 100 MG capsule Take 2 capsules (200 mg total) by mouth 3 (three) times daily. 07/25/14  Yes Evlyn Kanner Love, PA-C  hydrALAZINE (APRESOLINE) 100 MG tablet Take 1 tablet (100 mg total) by mouth every 8 (eight) hours. 07/25/14  Yes Evlyn Kanner Love, PA-C  hydrochlorothiazide (MICROZIDE) 12.5 MG capsule Take 1 capsule (12.5 mg total) by mouth daily. 07/25/14  Yes Evlyn Kanner Love, PA-C  labetalol (NORMODYNE) 300 MG tablet Take 1  tablet (300 mg total) by mouth 2 (two) times daily. 07/25/14  Yes Evlyn Kanner Love, PA-C  methocarbamol (ROBAXIN) 500 MG tablet Take 1 tablet (500 mg total) by mouth every 6 (six) hours as needed for muscle spasms. 07/25/14  Yes Evlyn Kanner Love, PA-C  nicotine (NICODERM CQ - DOSED IN MG/24 HOURS) 14 mg/24hr patch Place 1 patch (14 mg total) onto the skin daily. 07/25/14  Yes Evlyn Kanner Love, PA-C  polyethylene glycol (MIRALAX / GLYCOLAX) packet Take 17 g by mouth daily after supper. 07/25/14  Yes Evlyn Kanner Love, PA-C  potassium chloride SA (K-DUR,KLOR-CON) 20 MEQ tablet Take 1 tablet (20 mEq total) by mouth 2 (two) times daily. 07/25/14  Yes Evlyn Kanner Love, PA-C  sertraline (ZOLOFT) 100 MG tablet Take 1 tablet (100 mg total) by mouth daily. 07/25/14  Yes Evlyn Kanner Love, PA-C  traMADol (ULTRAM) 50 MG tablet Take 1 tablet (50 mg total) by mouth every 6 (six) hours as needed for moderate pain. 07/25/14  Yes Pamela S Love, PA-C   BP 182/98  Pulse 74  Temp(Src) 98.7 F (37.1 C) (Oral)  Resp 19  Ht  (1.626 m)  Wt 140 lb (63.504 kg)  BMI 24.02 kg/m2  SpO2 100% Physical Exam  Constitutional: [name], place, and time. She appears well-developed and well-nourished. No distress.  HENT:  Head: Normocephalic and atraumatic.  Right Ear: External ear normal.  Left Ear: External ear normal.  Eyes: Conjunctivae and EOM are normal. Right eye exhibits no discharge. Left eye exhibits no discharge.  Neck: Normal range of motion. Neck supple. No JVD present.  Cardiovascular: Normal rate, regular rhythm and normal heart sounds.  Exam reveals no gallop and no friction rub.   No murmur heard. Pulmonary/Chest: Breath sounds normal. No accessory muscle usage or stridor. Tachypnea noted. No respiratory distress. She has no decreased breath sounds. She has no wheezes. She has no rhonchi. She has no rales. She exhibits no tenderness.  Abdominal: Soft. Bowel sounds are normal. She exhibits no distension. There is no  tenderness. There is no rebound and no guarding.  Musculoskeletal: Normal range of motion. She exhibits no edema.  Neurological: She is alert and oriented to person, place, and time.  Skin: Skin is warm. No rash noted. She is not diaphoretic.  Psychiatric: Her behavior is normal. Her mood appears anxious.    ED Course  Procedures (including critical care time) Labs Review Labs Reviewed  CBC WITH DIFFERENTIAL - Abnormal; Notable for the following:    Hemoglobin 11.4 (*)    HCT 34.9 (*)    All other components within normal limits  PRO B NATRIURETIC PEPTIDE - Abnormal; Notable for the following:  Pro B Natriuretic peptide (BNP) 300.2 (*)    All other components within normal limits  I-STAT CHEM 8, ED - Abnormal; Notable for the following:    Potassium 3.2 (*)    Creatinine, Ser 1.30 (*)    Glucose, Bld 115 (*)    Calcium, Ion 1.25 (*)    All other components within normal limits  I-STAT TROPOININ, ED    Imaging Review No results found.   EKG Interpretation   Date/Time:  Thursday August 01 2014 10:02:06 EDT Ventricular Rate:  71 PR Interval:  125 QRS Duration: 80 QT Interval:  348 QTC Calculation: 378 R Axis:   42 Text Interpretation:  Sinus rhythm Probable left atrial enlargement  Probable LVH with secondary repol abnrm T wave changes laterally changed  from last one, but noted on previous EKG in Aug Confirmed by Juleen China  MD,  Iasha Mccalister ([id number]) on 08/01/2014 10:10:33 AM      MDM   Final diagnoses:  None    Pt presents with chest pain and SOB in the setting of an elevated BP and known Type B aortic dissection. CT unchanged from previously.   Gave 1 mg ativan for anxiolysis and 20 mg IV labetalol for target BP. Anxiety appeared to improve given the tachypnea improved. SBP continued to be near 180, another dose of labetalol was given without any improvement of her SBP. HR did decreased slightly to the low 60's, so 20 mg IV hydralazine was given. SBP improved to 125.  SBP remained stable and within a reasonable range, as did the HR. Safe and stable to dc home. Will need to fu with cardiology for further management of her BP. Also will need to fu with her PCP for further management of her anxiety. I answered all of the patient's questions and the family's questions. They were in agreement with the plan. Strong return precautions given for worsening symptoms or any other alarming or concerning symptoms or issues. The patient was in agreement with the treatment plan and I answered all of their questions. The patient was stable for dc. At dc, the patient was pain free, was moving all four extremities, symptoms improved, NAD.  If performed and available, imaging studies and labs reviewed.   Care discussed with my attending, Dr. Juleen China.   Sena Hitch, MD 08/01/14 1538

## 2014-08-01 NOTE — ED Notes (Signed)
Per EMS: from home, lives with daughters.  Patient got upset during morning meds and began hyperventilating, c/o mid-sternal chest pain that is non-radiating, resolved spontaneously with EMS at scene. During the CP, patient rates severity at 9/10, patient currently is not experiencing any CP.  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  12 lead unremarkable with EMS, LVH NSR. EMS VS: 188/102, HR 76, 28 RR, 98% RA.

## 2014-08-01 NOTE — ED Provider Notes (Signed)
I saw and evaluated the patient, reviewed the resident's note and I agree with the findings and plan.   EKG Interpretation   Date/Time:  Thursday August 01 2014 10:02:06 EDT Ventricular Rate:  71 PR Interval:  125 QRS Duration: 80 QT Interval:  348 QTC Calculation: 378 R Axis:   42 Text Interpretation:  Sinus rhythm Probable left atrial enlargement  Probable LVH with secondary repol abnrm T wave changes laterally changed  from last one, but noted on previous EKG in Aug Confirmed by Juleen China  MD,  Simon Aaberg ([id number]) on 08/01/2014 10:10:33 AM      48yF with PC. Known recent thoracic aortic dissection. Type B. New/worsened CP today. Hypertensive. Initially thought might have to admit for better BP control in setting of fairly recently diagnosed dissection, butCT does not show progression/new findings. BP improving. Did not take home meds today. At this point I feel appropriate for DC. Discussed importance of med compliance.   Raeford Razor, MD 08/07/14 1153

## 2014-08-01 NOTE — Clinical Social Work Note (Signed)
Clinical Social Worker received notification from [facility] stating that patient is now requesting possible SNF placement vs. Return home with continued home health services.  CSW spoke with CSW Chiropodist who states that due to patient ability to safely discharge home with daughter, placement will need to be pursued from home with home health social worker.  CSW notified RNCM who will update patient family.  Macario Golds, [place] [phone]

## 2014-08-04 ENCOUNTER — Emergency Department (HOSPITAL_COMMUNITY): Payer: Medicaid Other

## 2014-08-04 ENCOUNTER — Emergency Department (HOSPITAL_COMMUNITY)
Admission: EM | Admit: 2014-08-04 | Discharge: 2014-08-04 | Disposition: A | Discharge status: Home / Self Care | Payer: Medicaid Other | Attending: Emergency Medicine | Admitting: Emergency Medicine

## 2014-08-04 ENCOUNTER — Encounter (HOSPITAL_COMMUNITY): Payer: Self-pay | Admitting: Emergency Medicine

## 2014-08-04 DIAGNOSIS — IMO0002 Reserved for concepts with insufficient information to code with codable children: Secondary | ICD-10-CM | POA: Insufficient documentation

## 2014-08-04 DIAGNOSIS — R079 Chest pain, unspecified: Secondary | ICD-10-CM | POA: Diagnosis not present

## 2014-08-04 DIAGNOSIS — F410 Panic disorder [episodic paroxysmal anxiety] without agoraphobia: Secondary | ICD-10-CM

## 2014-08-04 DIAGNOSIS — I158 Other secondary hypertension: Secondary | ICD-10-CM | POA: Diagnosis not present

## 2014-08-04 DIAGNOSIS — Z87891 Personal history of nicotine dependence: Secondary | ICD-10-CM | POA: Insufficient documentation

## 2014-08-04 DIAGNOSIS — F319 Bipolar disorder, unspecified: Secondary | ICD-10-CM | POA: Insufficient documentation

## 2014-08-04 DIAGNOSIS — Z862 Personal history of diseases of the blood and blood-forming organs and certain disorders involving the immune mechanism: Secondary | ICD-10-CM | POA: Diagnosis not present

## 2014-08-04 DIAGNOSIS — Q6239 Other obstructive defects of renal pelvis and ureter: Secondary | ICD-10-CM | POA: Diagnosis not present

## 2014-08-04 DIAGNOSIS — I159 Secondary hypertension, unspecified: Secondary | ICD-10-CM

## 2014-08-04 DIAGNOSIS — Z8673 Personal history of transient ischemic attack (TIA), and cerebral infarction without residual deficits: Secondary | ICD-10-CM | POA: Insufficient documentation

## 2014-08-04 DIAGNOSIS — Q6211 Congenital occlusion of ureteropelvic junction: Secondary | ICD-10-CM

## 2014-08-04 DIAGNOSIS — G43909 Migraine, unspecified, not intractable, without status migrainosus: Secondary | ICD-10-CM | POA: Insufficient documentation

## 2014-08-04 DIAGNOSIS — R0602 Shortness of breath: Secondary | ICD-10-CM | POA: Diagnosis not present

## 2014-08-04 DIAGNOSIS — M129 Arthropathy, unspecified: Secondary | ICD-10-CM | POA: Diagnosis not present

## 2014-08-04 DIAGNOSIS — F411 Generalized anxiety disorder: Secondary | ICD-10-CM | POA: Insufficient documentation

## 2014-08-04 DIAGNOSIS — Z79899 Other long term (current) drug therapy: Secondary | ICD-10-CM | POA: Diagnosis not present

## 2014-08-04 DIAGNOSIS — K219 Gastro-esophageal reflux disease without esophagitis: Secondary | ICD-10-CM | POA: Insufficient documentation

## 2014-08-04 LAB — CBC
HCT: 35.3 % — ABNORMAL LOW (ref 36.0–46.0)
Hemoglobin: 11.5 g/dL — ABNORMAL LOW (ref 12.0–15.0)
MCH: 27.6 pg (ref 26.0–34.0)
MCHC: 32.6 g/dL (ref 30.0–36.0)
MCV: 84.7 fL (ref 78.0–100.0)
PLATELETS: 273 10*3/uL (ref 150–400)
RBC: 4.17 MIL/uL (ref 3.87–5.11)
RDW: 14 % (ref 11.5–15.5)
WBC: 6.4 10*3/uL (ref 4.0–10.5)

## 2014-08-04 LAB — PROTIME-INR
INR: 1.11 (ref 0.00–1.49)
Prothrombin Time: 14.3 seconds (ref 11.6–15.2)

## 2014-08-04 LAB — COMPREHENSIVE METABOLIC PANEL
ALBUMIN: 3.6 g/dL (ref 3.5–5.2)
ALK PHOS: 133 U/L — AB (ref 39–117)
ALT: 34 U/L (ref 0–35)
AST: 33 U/L (ref 0–37)
Anion gap: 17 — ABNORMAL HIGH (ref 5–15)
BILIRUBIN TOTAL: 0.4 mg/dL (ref 0.3–1.2)
BUN: 19 mg/dL (ref 6–23)
CHLORIDE: 103 meq/L (ref 96–112)
CO2: 22 mEq/L (ref 19–32)
Calcium: 10.1 mg/dL (ref 8.4–10.5)
Creatinine, Ser: 1.24 mg/dL — ABNORMAL HIGH (ref 0.50–1.10)
GFR calc Af Amer: 58 mL/min — ABNORMAL LOW (ref >90)
GFR calc non Af Amer: 50 mL/min — ABNORMAL LOW (ref >90)
Glucose, Bld: 90 mg/dL (ref 70–99)
POTASSIUM: 3.9 meq/L (ref 3.7–5.3)
Sodium: 142 mEq/L (ref 137–147)
Total Protein: 8.1 g/dL (ref 6.0–8.3)

## 2014-08-04 LAB — I-STAT TROPONIN, ED: Troponin i, poc: 0 ng/mL (ref 0.00–0.08)

## 2014-08-04 LAB — ETHANOL: Alcohol, Ethyl (B): <11 mg/dL (ref 0–11)

## 2014-08-04 MED ORDER — HYDROCHLOROTHIAZIDE 12.5 MG PO CAPS
12.5000 mg | ORAL_CAPSULE | Freq: Every day | ORAL | Status: DC
  Administered 2014-08-04: 12.5 mg via ORAL
  Filled 2014-08-04: qty 1

## 2014-08-04 MED ORDER — LABETALOL HCL 300 MG PO TABS
300.0000 mg | ORAL_TABLET | Freq: Two times a day (BID) | ORAL | Status: DC
Start: 2014-08-04 — End: 2014-08-04
  Administered 2014-08-04: 300 mg via ORAL
  Filled 2014-08-04: qty 1

## 2014-08-04 MED ORDER — ALPRAZOLAM 0.25 MG PO TABS
0.2500 mg | ORAL_TABLET | Freq: Three times a day (TID) | ORAL | Status: DC | PRN
  Administered 2014-08-04: 0.25 mg via ORAL
  Filled 2014-08-04: qty 1

## 2014-08-04 MED ORDER — NITROGLYCERIN 0.4 MG SL SUBL: 0.4000 mg | SUBLINGUAL_TABLET | SUBLINGUAL | Status: DC | PRN

## 2014-08-04 MED ORDER — NITROGLYCERIN 2 % TD OINT
1.0000 [in_us] | TOPICAL_OINTMENT | Freq: Once | TRANSDERMAL | Status: AC
  Administered 2014-08-04: 1 [in_us] via TOPICAL
  Filled 2014-08-04: qty 30

## 2014-08-04 MED ORDER — HYDRALAZINE HCL 50 MG PO TABS
100.0000 mg | ORAL_TABLET | Freq: Three times a day (TID) | ORAL | Status: DC
  Administered 2014-08-04: 100 mg via ORAL
  Filled 2014-08-04 (×3): qty 2

## 2014-08-04 MED ORDER — ASPIRIN 81 MG PO CHEW
324.0000 mg | CHEWABLE_TABLET | Freq: Once | ORAL | Status: AC
  Administered 2014-08-04: 324 mg via ORAL
  Filled 2014-08-04: qty 4

## 2014-08-04 MED ORDER — TRAMADOL HCL 50 MG PO TABS: 50.0000 mg | ORAL_TABLET | Freq: Four times a day (QID) | ORAL | Status: DC | PRN

## 2014-08-04 NOTE — ED Notes (Signed)
MD at bedside. 

## 2014-08-04 NOTE — ED Notes (Signed)
Bed: WA06 Expected date: 08/04/14 Expected time: 2:40 PM Means of arrival: Ambulance Comments: Not eating, ? Needs placement

## 2014-08-04 NOTE — Discharge Instructions (Signed)
Hypertension °Hypertension is another name for high blood pressure. High blood pressure forces your heart to work harder to pump blood. A blood pressure reading has two numbers, which includes a higher number over a lower number (example: 110/72). °HOME CARE  °· Have your blood pressure rechecked by your doctor. °· Only take medicine as told by your doctor. Follow the directions carefully. The medicine does not work as well if you skip doses. Skipping doses also puts you at risk for problems. °· Do not smoke. °· Monitor your blood pressure at home as told by your doctor. °GET HELP IF: °· You think you are having a reaction to the medicine you are taking. °· You have repeat headaches or feel dizzy. °· You have puffiness (swelling) in your ankles. °· You have trouble with your vision. °GET HELP RIGHT AWAY IF:  °· You get a very bad headache and are confused. °· You feel weak, numb, or faint. °· You get chest or belly (abdominal) pain. °· You throw up (vomit). °· You cannot breathe very well. °MAKE SURE YOU:  °· Understand these instructions. °· Will watch your condition. °· Will get help right away if you are not doing well or get worse. °Document Released: 04/26/2008 Document Revised: 11/13/2013 Document Reviewed: 08/31/2013 °ExitCare® Patient Information ©2015 [employer]. This information is not intended to replace advice given to you by your health care provider. Make sure you discuss any questions you have with your health care provider. ° °Panic Attacks °Panic attacks are sudden, short-lived surges of severe anxiety, fear, or discomfort. They may occur for no reason when you are relaxed, when you are anxious, or when you are sleeping. Panic attacks may occur for a number of reasons:  °· Healthy people occasionally have panic attacks in extreme, life-threatening situations, such as war or natural disasters. Normal anxiety is a protective mechanism of the body that helps us react to danger (fight or flight  response). °· Panic attacks are often seen with anxiety disorders, such as panic disorder, social anxiety disorder, generalized anxiety disorder, and phobias. Anxiety disorders cause excessive or uncontrollable anxiety. They may interfere with your relationships or other life activities. °· Panic attacks are sometimes seen with other mental illnesses, such as depression and posttraumatic stress disorder. °· Certain medical conditions, prescription medicines, and drugs of abuse can cause panic attacks. °SYMPTOMS  °Panic attacks start suddenly, peak within 20 minutes, and are accompanied by four or more of the following symptoms: °· Pounding heart or fast heart rate (palpitations). °· Sweating. °· Trembling or shaking. °· Shortness of breath or feeling smothered. °· Feeling choked. °· Chest pain or discomfort. °· Nausea or strange feeling in your stomach. °· Dizziness, light-headedness, or feeling like you will faint. °· Chills or hot flushes. °· Numbness or tingling in your lips or hands and feet. °· Feeling that things are not real or feeling that you are not yourself. °· Fear of losing control or going crazy. °· Fear of dying. °Some of these symptoms can mimic serious medical conditions. For example, you may think you are having a heart attack. Although panic attacks can be very scary, they are not life threatening. °DIAGNOSIS  °Panic attacks are diagnosed through an assessment by your health care provider. Your health care provider will ask questions about your symptoms, such as where and when they occurred. Your health care provider will also ask about your medical history and use of alcohol and drugs, including prescription medicines. Your health care provider may   order blood tests or other studies to rule out a serious medical condition. Your health care provider may refer you to a mental health professional for further evaluation. °TREATMENT  °· Most healthy people who have one or two panic attacks in an  extreme, life-threatening situation will not require treatment. °· The treatment for panic attacks associated with anxiety disorders or other mental illness typically involves counseling with a mental health professional, medicine, or a combination of both. Your health care provider will help determine what treatment is best for you. °· Panic attacks due to physical illness usually go away with treatment of the illness. If prescription medicine is causing panic attacks, talk with your health care provider about stopping the medicine, decreasing the dose, or substituting another medicine. °· Panic attacks due to alcohol or drug abuse go away with abstinence. Some adults need professional help in order to stop drinking or using drugs. °HOME CARE INSTRUCTIONS  °· Take all medicines as directed by your health care provider.   °· Schedule and attend follow-up visits as directed by your health care provider. It is important to keep all your appointments. °SEEK MEDICAL CARE IF: °· You are not able to take your medicines as prescribed. °· Your symptoms do not improve or get worse. °SEEK IMMEDIATE MEDICAL CARE IF:  °· You experience panic attack symptoms that are different than your usual symptoms. °· You have serious thoughts about hurting yourself or others. °· You are taking medicine for panic attacks and have a serious side effect. °MAKE SURE YOU: °· Understand these instructions. °· Will watch your condition. °· Will get help right away if you are not doing well or get worse. °Document Released: 11/08/2005 Document Revised: 11/13/2013 Document Reviewed: 06/22/2013 °ExitCare® Patient Information ©2015 [employer]. This information is not intended to replace advice given to you by your health care provider. Make sure you discuss any questions you have with your health care provider. ° °

## 2014-08-04 NOTE — ED Notes (Signed)
PT REQUESTING TO FOR SISTER TO COME GET HER ALICE [facility] CONTACT # [phone]

## 2014-08-04 NOTE — ED Notes (Signed)
Transported back by [employer]

## 2014-08-04 NOTE — Progress Notes (Addendum)
NCM spoke to pt and states she has HH with [facility]. [facility] RN has come to see her since dc on 9/10 but not HH SW. States she has her own apt that is provided by Section 8. Her apt rent is covered under Section 8 program. She reports that she is living in the home with her dtrs, [name] and [name]. [name] is currently the primary caregiver. She is not allowing pt to smoke and dtr is trying to adhere to the heart healthy diet given at dc.  Pt states she should be able to smoke and eat what she likes at home. She was independent prior to stroke and being dependent is stressful. She currently has applied for her SSI and the attorney is working to help with getting her disability. She does not have other family at this time available to assist with her care in her home. Therapeutic Communication used to help patient express her concerns. NCM encouraged her to speak to dtrs and try to establish a plan at home that will help her get stronger so she can get back to living independently. Pt continued to express frustrations and wants her sister, [name] to pick her when she is dc from [place]. Contacted sister and left msg for a return call. NCM will follow up with [facility] on 08/05/2014. Pt is adamantly against SNF placement.  Pt gave permission to speak to dtr, [name]. Contacted dtr, [name].  Dtr states she is assisting her mother at home. She is willing for mother to come back to her home. Isidoro Donning RN CCM Case Mgmt phone [phone]

## 2014-08-04 NOTE — ED Notes (Signed)
PT DECLINES URINE SAMPLE COLLECTION STATING "NOTHING WRONG WITH ME" [name] EMT WITNESSED. PT ALSO READY FOR DISCHARGE. EDP JON KNAPP MADE AWARE

## 2014-08-04 NOTE — ED Notes (Signed)
Pt refused in and out cath for urine. DR. Roselyn Bering aware.

## 2014-08-04 NOTE — ED Provider Notes (Signed)
CSN: [id number]     Arrival date & time 08/04/14  1452 History   First MD Initiated Contact with Patient 08/04/14 1514     Chief Complaint  Patient presents with  . Anxiety  . Panic Attack   HPI Pt got into an argument with her daughter.  She started hyperventilating and became very anxious.  Pt had a panic attack following that.  Pt had a stroke last month and was in the hospital.  Pt has not been able to walk since the stoke.  She is living with her daughter.  She has not eaten anything and did not take her medications today.  She does not want to live with her daughter any longer.  She wants to live at her own home with her wheelchair.  No suicidal ideation.  No homicidal ideation.  Past Medical History  Diagnosis Date  . Hypertension   . Family history of anesthesia complication     "alot of back pains from the epidurals"  . Complication of anesthesia     "alot of back pains from the epidurals"  . Anemia   . Depression   . GERD (gastroesophageal reflux disease)   . Daily headache   . Migraine     "sometimes twice/day" (07/05/2014)  . Stroke 06/29/2014    residual BLE weakness  . 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 Polycystic kidney disease     /notes 07/05/2014  . 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 Arthritis     "shoulders" (07/05/2014)  . Bipolar disorder   . Anxiety    Past Surgical History  Procedure Laterality Date  . Cesarean section  1986  . Tubal ligation  1986  . Spinal drain placement  06/29/2014  . Spinal drain removal  07/02/2014  . Thoracentesis  07/05/2014    [name] 07/05/2014   Family History  Problem Relation Age of Onset  . Stroke Father   . Diabetes type II Other    History  Substance Use Topics  . Smoking status: Former Smoker -- 1.00 packs/day for 37 years    Types: Cigarettes  . Smokeless tobacco: Never Used  . Alcohol Use: Yes     Comment: 07/05/2014  "recovering alcoholic since 2013"   OB History   Grav Para Term Preterm Abortions TAB SAB Ect Mult Living                 Review of Systems  Constitutional: Negative for fever.  Respiratory: Positive for shortness of breath.   Cardiovascular: Positive for chest pain.  Gastrointestinal: Negative for vomiting, abdominal pain and diarrhea.  Genitourinary: Negative for dysuria.  Musculoskeletal: Positive for gait problem.  Neurological: Negative for tremors, seizures and speech difficulty.  Psychiatric/Behavioral: Positive for agitation.  All other systems reviewed and are negative.     Allergies  Seroquel and Olanzapine  Home Medications   Prior to Admission medications   Medication Sig Start Date End Date Taking? Authorizing Provider  acetaminophen (TYLENOL) 325 MG tablet Take 1-2 tablets (325-650 mg total) by mouth every 4 (four) hours as needed for mild pain. 07/25/14  Yes Evlyn Kanner Love, PA-C  ALPRAZolam (XANAX) 0.25 MG tablet Take 1 tablet (0.25 mg total) by mouth 3 (three) times daily as needed for anxiety. 07/25/14  Yes Evlyn Kanner Love, PA-C  atorvastatin (LIPITOR) 20 MG tablet Take 1 tablet (20 mg total) by  mouth daily at 6 PM. 07/25/14  Yes Evlyn Kanner Love, PA-C  bisacodyl (DULCOLAX) 10 MG suppository Place 1 suppository (10 mg total) rectally daily at 6 (six) AM. For bowel program. 07/25/14  Yes Evlyn Kanner Love, PA-C  famotidine (PEPCID) 20 MG tablet Take 1 tablet (20 mg total) by mouth 2 (two) times daily. 07/25/14  Yes Evlyn Kanner Love, PA-C  feeding supplement, ENSURE COMPLETE, (ENSURE COMPLETE) LIQD Take 237 mLs by mouth 3 (three) times daily between meals. 07/08/14  Yes Christiane Ha, MD  gabapentin (NEURONTIN) 100 MG capsule Take 2 capsules (200 mg total) by mouth 3 (three) times daily. 07/25/14  Yes Evlyn Kanner Love, PA-C  hydrALAZINE (APRESOLINE) 100 MG tablet Take 1 tablet (100 mg total) by mouth every 8 (eight) hours. 07/25/14  Yes Evlyn Kanner Love, PA-C  hydrochlorothiazide (MICROZIDE)  12.5 MG capsule Take 1 capsule (12.5 mg total) by mouth daily. 07/25/14  Yes Evlyn Kanner Love, PA-C  labetalol (NORMODYNE) 300 MG tablet Take 1 tablet (300 mg total) by mouth 2 (two) times daily. 07/25/14  Yes Evlyn Kanner Love, PA-C  methocarbamol (ROBAXIN) 500 MG tablet Take 1 tablet (500 mg total) by mouth every 6 (six) hours as needed for muscle spasms. 07/25/14  Yes Evlyn Kanner Love, PA-C  nicotine (NICODERM CQ - DOSED IN MG/24 HOURS) 14 mg/24hr patch Place 1 patch (14 mg total) onto the skin daily. 07/25/14  Yes Evlyn Kanner Love, PA-C  polyethylene glycol (MIRALAX / GLYCOLAX) packet Take 17 g by mouth daily after supper. 07/25/14  Yes Evlyn Kanner Love, PA-C  potassium chloride SA (K-DUR,KLOR-CON) 20 MEQ tablet Take 1 tablet (20 mEq total) by mouth 2 (two) times daily. 07/25/14  Yes Evlyn Kanner Love, PA-C  sertraline (ZOLOFT) 100 MG tablet Take 1 tablet (100 mg total) by mouth daily. 07/25/14  Yes Evlyn Kanner Love, PA-C  traMADol (ULTRAM) 50 MG tablet Take 1 tablet (50 mg total) by mouth every 6 (six) hours as needed for moderate pain. 07/25/14  Yes Pamela S Love, PA-C   BP 185/86  Pulse 71  Temp(Src) 97.4 F (36.3 C) (Oral)  Resp 22  SpO2 97% Physical Exam  Nursing note and vitals reviewed. Constitutional: No distress.  HENT:  Head: Normocephalic and atraumatic.  Right Ear: External ear normal.  Left Ear: External ear normal.  Eyes: Conjunctivae are normal. Right eye exhibits no discharge. Left eye exhibits no discharge. No scleral icterus.  Neck: Neck supple. No tracheal deviation present.  Cardiovascular: Normal rate, regular rhythm and intact distal pulses.   Pulmonary/Chest: Effort normal and breath sounds normal. No stridor. No respiratory distress. She has no wheezes. She has no rales.  Abdominal: Soft. Bowel sounds are normal. She exhibits no distension and no mass. There is no tenderness. There is no rebound and no guarding.  Musculoskeletal: She exhibits no edema and no tenderness.  Neurological: She is alert.  No cranial nerve deficit (no facial droop, extraocular movements intact, no slurred speech) or sensory deficit. She exhibits normal muscle tone. She displays no seizure activity. Coordination normal.  Weakness bilateral lower extremities, she is able to move her toes, strong equal grip strength bilateral ue  Skin: Skin is warm and dry. No rash noted.  Psychiatric: Her mood appears anxious. Her affect is angry.    ED Course  Procedures (including critical care time) Labs Review Labs Reviewed  CBC - Abnormal; Notable for the following:    Hemoglobin 11.5 (*)    HCT 35.3 (*)  All other components within normal limits  COMPREHENSIVE METABOLIC PANEL - Abnormal; Notable for the following:    Creatinine, Ser 1.24 (*)    Alkaline Phosphatase 133 (*)    GFR calc non Af Amer 50 (*)    GFR calc Af Amer 58 (*)    Anion gap 17 (*)    All other components within normal limits  PROTIME-INR  ETHANOL  URINE RAPID DRUG SCREEN (HOSP PERFORMED)  I-STAT TROPOININ, ED   Recent CT scan earlier this week. IMPRESSION:  No acute finding. Negative for aortic dissection or aneurysm.  Intramural hematoma about the aortic arch seen on the most recent  examination continues to resolve.  Findings consistent with adult polycystic kidney disease.  Nonobstructing stone lower pole left kidney noted.  Decreased small bilateral pleural effusions, greater on the left.  Electronically Signed  By: Drusilla Kanner M.D.  On: 08/01/2014 12:00  Imaging Review Dg Chest Portable 1 View  08/04/2014   CLINICAL DATA:  Chest pain. Anxiety attack. Smoker. Patient has known chronic thoracic aorta dissection. Seen new  EXAM: PORTABLE CHEST - 1 VIEW  COMPARISON:  CT angio chest 08/01/2014 and 07/29/2014.  FINDINGS: Heart size is normal. Patient is rotated slightly to the right. Prominent aortic contour, particularly at the level of the thoracic aortic arch and proximal descending thoracic aorta appears similar compared to scout  view of the chest CT dated 08/01/2014. Lungs are clear. Negative for pleural effusion or pneumothorax. Trachea midline. No acute bony abnormality.  IMPRESSION: Prominent thoracic aorta contour. Patient has known recent history of thoracic aortic intramural hematoma. The lungs are clear.   Electronically Signed   By: Britta Mccreedy M.D.   On: 08/04/2014 16:17     EKG Interpretation   Date/Time:  Sunday August 04 2014 16:22:55 EDT Ventricular Rate:  90 PR Interval:  135 QRS Duration: 80 QT Interval:  387 QTC Calculation: 473 R Axis:   59 Text Interpretation:  Sinus rhythm Consider left ventricular hypertrophy  Baseline wander in lead(s) II III aVF V3 No significant change since last  tracing Confirmed by Ernestine Langworthy  MD-J, Eain Mullendore ([id number]) on 08/04/2014 4:42:34 PM      MDM   Final diagnoses:  Secondary hypertension, unspecified  Anxiety attack    Patient was monitored in the emergency department. Her anxiety and hyperventilation decreased. The patient was given doses of her blood pressure medications that she was hypertensive. She has had a recent diagnosis of a thoracic aortic dissection and needs to take her medications. The patient did admit that she had not taken them today. Patient just had a CT scan performed on the 10th that showed stable findings associated with her dissection.  Without any acute exacerbation of that at this time.  Patient  Would like to go home. She is spoken to Ms.Shaviss from case management regarding her home situation. The patient is unhappy living with her daughter however she cannot live in her apartment right now because it requires stairs to get there. They're in the process of moving her to a different apartment.  Pt understands and can make her own decisions    Linwood Dibbles, MD 08/04/14 1733

## 2014-08-04 NOTE — ED Notes (Signed)
Pt presents with NAD. Hyperventilating. Domestic situation. Pt lives with daughter recent placement. Pt desires to live by herself. Pt has mobility  impairment and it is unsafe to live by herself. Pt's boyfriend is not aloud to visit at daughter's house. Pt declines to eat and take medications. Daughter, mother and sister witnesses to this behavior.

## 2014-08-07 ENCOUNTER — Encounter: Payer: Self-pay | Admitting: Cardiology

## 2014-08-07 ENCOUNTER — Ambulatory Visit (INDEPENDENT_AMBULATORY_CARE_PROVIDER_SITE_OTHER): Payer: Medicaid Other | Admitting: Cardiology

## 2014-08-07 ENCOUNTER — Telehealth: Payer: Self-pay | Admitting: *Deleted

## 2014-08-07 VITALS — BP 102/69 | HR 88 | Ht 64.0 in | Wt 140.0 lb

## 2014-08-07 DIAGNOSIS — I7123 Aneurysm of the descending thoracic aorta, without rupture: Secondary | ICD-10-CM

## 2014-08-07 DIAGNOSIS — G9511 Acute infarction of spinal cord (embolic) (nonembolic): Secondary | ICD-10-CM

## 2014-08-07 DIAGNOSIS — I10 Essential (primary) hypertension: Secondary | ICD-10-CM

## 2014-08-07 DIAGNOSIS — I71019 Dissection of thoracic aorta, unspecified: Secondary | ICD-10-CM

## 2014-08-07 DIAGNOSIS — I7101 Dissection of thoracic aorta: Secondary | ICD-10-CM

## 2014-08-07 DIAGNOSIS — G8220 Paraplegia, unspecified: Secondary | ICD-10-CM

## 2014-08-07 NOTE — Telephone Encounter (Signed)
Received a call from patients daughter that patient felt like a "twinge" in her chest and hurting under her left breast. Asked daughter if she was having any shortness of breath, only when moved certain way. Offered the daughter to bring her in now or if she felt she needed to go to ED, daughter stated patient just wanted to wait until afternoon appointment

## 2014-08-07 NOTE — Patient Instructions (Addendum)
DECREASE YOUR HYDRALAZINE TO 100 MG TWICE A DAY  Your physician recommends that you schedule a follow-up appointment in: 3 MONTH OV/EKG

## 2014-08-07 NOTE — Progress Notes (Signed)
[name] Date of Birth:  Nov 16, 1966 [facility] [address] [place], [place]  [address] [phone]        Fax   [phone]   History of Present Illness: This pleasant 48 year old [place] American woman is seen in the in the office for the first time.  I had seen her on rounds at: Hospital during her admission there.  She was admitted in August 2015 with a dissected during aneurysm of the thoracic aorta.  It was a type B aortic dissection.  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  She was discharged from, hospital on 07/25/14.  4 days later she went to [facility] or chest pain.  She ruled out for myocardial infarction.  She was discharged on 07/31/49.  She went back to come hospital on 08/02/14 to the emergency room and was evaluated and released.  Since discharge from the hospital she has not yet started home physical therapy.  Apparently her referrals are in process for home health nurse and physical therapist to help her with leg strength. Her blood pressure today is low.  She has had some mild lightheaded episodes.  She has not had syncope. She is a former smoker.  She stopped smoking when she had her aortic dissection and her stroke last month. She has had some atypical left-sided chest discomfort under the left lateral breast.  No precordial chest discomfort is noted.  Current Outpatient Prescriptions  Medication Sig Dispense Refill  . ALPRAZolam (XANAX) 0.25 MG tablet Take 1 tablet (0.25 mg total) by mouth 3 (three) times daily as needed for anxiety.  30 tablet  0  . atorvastatin (LIPITOR) 20 MG tablet Take 1 tablet (20 mg total) by mouth daily at 6 PM.  30 tablet  1  . bisacodyl (DULCOLAX) 10 MG suppository Place 1 suppository (10 mg total) rectally daily at 6 (six) AM. For bowel program.  30 suppository  0  . famotidine (PEPCID) 20 MG tablet Take 1 tablet (20 mg total) by mouth 2  (two) times daily.  60 tablet  1  . feeding supplement, ENSURE COMPLETE, (ENSURE COMPLETE) LIQD Take 237 mLs by mouth 3 (three) times daily between meals.      . gabapentin (NEURONTIN) 100 MG capsule Take 2 capsules (200 mg total) by mouth 3 (three) times daily.  180 capsule  1  . hydrALAZINE (APRESOLINE) 100 MG tablet Take 100 mg by mouth 2 (two) times daily.      . hydrochlorothiazide (MICROZIDE) 12.5 MG capsule Take 1 capsule (12.5 mg total) by mouth daily.  30 capsule  1  . labetalol (NORMODYNE) 300 MG tablet Take 1 tablet (300 mg total) by mouth 2 (two) times daily.  60 tablet  1  . methocarbamol (ROBAXIN) 500 MG tablet Take 1 tablet (500 mg total) by mouth every 6 (six) hours as needed for muscle spasms.  60 tablet  0  . polyethylene glycol (MIRALAX / GLYCOLAX) packet Take 17 g by mouth daily after supper.  30 each  0  . potassium chloride SA (K-DUR,KLOR-CON) 20 MEQ tablet Take 1 tablet (20 mEq total) by mouth 2 (two) times daily.  60 tablet  1  . sertraline (ZOLOFT) 100 MG tablet Take 1 tablet (100 mg total) by mouth daily.  30 tablet  1  . traMADol (ULTRAM) 50 MG tablet Take 1 tablet (50 mg total) by mouth every 6 (six)  hours as needed for moderate pain.  45 tablet  0   No current facility-administered medications for this visit.    Allergies  Allergen Reactions  . Seroquel [Quetiapine] Other (See Comments)    Night sweats, dizziness and possible confusion  . Olanzapine Nausea And Vomiting    Patient Active Problem List   Diagnosis Date Noted  . Other secondary hypertension 08/01/2014  . Anxiety 07/29/2014  . Type 3 dissection of thoracic aorta 07/29/2014  . UTI (urinary tract infection) 07/25/2014  . Chronic renal insufficiency, stage III (moderate) 07/19/2014  . Dyslipidemia 07/19/2014  . 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 Polycystic kidney disease 06/29/2014  . 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 ANEMIA  02/09/2011  . TOBACCO ABUSE 02/09/2011  . COCAINE ABUSE 02/09/2011  . HTN (hypertension), malignant 02/09/2011  . NAUSEA 02/09/2011    History  Smoking status  . Former Smoker -- 1.00 packs/day for 37 years  . Types: Cigarettes  Smokeless tobacco  . Never Used    History  Alcohol Use  . Yes    Comment: 07/05/2014 "recovering alcoholic since 2013"    Family History  Problem Relation Age of Onset  . Stroke Father   . Diabetes type II Other     Review of Systems: Constitutional: no fever chills diaphoresis or fatigue or change in weight.  Head and neck: no hearing loss, no epistaxis, no photophobia or visual disturbance. Respiratory: No cough, shortness of breath or wheezing. Cardiovascular: No chest pain peripheral edema, palpitations. Gastrointestinal: No abdominal distention, no abdominal pain, no change in bowel habits hematochezia or melena. Genitourinary: No dysuria, no frequency, no urgency, no nocturia. Musculoskeletal:No arthralgias, no back pain, no gait disturbance or myalgias. Neurological: No dizziness, no headaches, no numbness, no seizures, no syncope, weakness in lower extremities present no tremors. Hematologic: No lymphadenopathy, no easy bruising. Psychiatric: No confusion, no hallucinations, no sleep disturbance.    Physical Exam: Filed Vitals:   08/07/14 1612  BP: 102/69  Pulse: 88  The patient appears to be in no distress.  She is in a wheelchair.  Because of her paraparesis she is unable to stand unassisted.  Head and neck exam reveals that the pupils are equal and reactive.  The extraocular movements are full.  There is no scleral icterus.  Mouth and pharynx are benign.  No lymphadenopathy.  No carotid bruits.  The jugular venous pressure is normal.  Thyroid is not enlarged or tender.  Chest is clear to percussion and auscultation.  No rales or rhonchi.  Expansion of the chest is symmetrical.  Heart reveals no abnormal lift or heave.  First and  second heart sounds are normal.  There is no murmur gallop rub or click.  The abdomen is soft and nontender.  Bowel sounds are normoactive.  There is no hepatosplenomegaly or mass.  There are no abdominal bruits.  Extremities reveal no phlebitis or edema.  Pedal pulses are good.  There is no cyanosis or clubbing.  Neurologic exam reveals marked weakness of lower extremities  Integument reveals no rash    Assessment / Plan: 1.  Status post type B. aortic dissection with resultant paraparesis of the lower extremities secondary to spinal cord ischemia. 2. hypertensive cardiovascular disease 3. bipolar disorder 4. chronic renal insufficiency  Disposition: Blood pressure is low today and we will reduce her hydralazine to just 100 mg twice a day. Continue other medications the same. Follow up with her PCP  Dr. Willey Blade Return here in 3 months for office visit and EKG

## 2014-08-22 ENCOUNTER — Encounter (HOSPITAL_COMMUNITY): Payer: Self-pay | Admitting: Emergency Medicine

## 2014-08-22 ENCOUNTER — Emergency Department (HOSPITAL_COMMUNITY): Payer: Medicaid Other

## 2014-08-22 ENCOUNTER — Inpatient Hospital Stay (HOSPITAL_COMMUNITY)
Admission: EM | Admit: 2014-08-22 | Discharge: 2014-08-26 | DRG: 220 | Disposition: A | Discharge status: Home Health | Payer: Medicaid Other | Attending: Cardiothoracic Surgery | Admitting: Cardiothoracic Surgery

## 2014-08-22 DIAGNOSIS — I10 Essential (primary) hypertension: Secondary | ICD-10-CM | POA: Diagnosis present

## 2014-08-22 DIAGNOSIS — J9811 Atelectasis: Secondary | ICD-10-CM | POA: Diagnosis not present

## 2014-08-22 DIAGNOSIS — Z823 Family history of stroke: Secondary | ICD-10-CM | POA: Diagnosis not present

## 2014-08-22 DIAGNOSIS — I7100 Dissection of unspecified site of aorta: Secondary | ICD-10-CM

## 2014-08-22 DIAGNOSIS — I7101 Dissection of thoracic aorta: Secondary | ICD-10-CM

## 2014-08-22 DIAGNOSIS — G8220 Paraplegia, unspecified: Secondary | ICD-10-CM | POA: Diagnosis present

## 2014-08-22 DIAGNOSIS — I712 Thoracic aortic aneurysm, without rupture: Secondary | ICD-10-CM | POA: Diagnosis present

## 2014-08-22 DIAGNOSIS — Z87891 Personal history of nicotine dependence: Secondary | ICD-10-CM | POA: Diagnosis not present

## 2014-08-22 DIAGNOSIS — D649 Anemia, unspecified: Secondary | ICD-10-CM | POA: Diagnosis present

## 2014-08-22 DIAGNOSIS — K219 Gastro-esophageal reflux disease without esophagitis: Secondary | ICD-10-CM | POA: Diagnosis present

## 2014-08-22 DIAGNOSIS — I71019 Dissection of thoracic aorta, unspecified: Secondary | ICD-10-CM | POA: Diagnosis present

## 2014-08-22 DIAGNOSIS — E876 Hypokalemia: Secondary | ICD-10-CM | POA: Diagnosis not present

## 2014-08-22 DIAGNOSIS — F419 Anxiety disorder, unspecified: Secondary | ICD-10-CM | POA: Diagnosis present

## 2014-08-22 DIAGNOSIS — F319 Bipolar disorder, unspecified: Secondary | ICD-10-CM | POA: Diagnosis present

## 2014-08-22 DIAGNOSIS — Q613 Polycystic kidney, unspecified: Secondary | ICD-10-CM | POA: Diagnosis not present

## 2014-08-22 LAB — URINALYSIS, ROUTINE W REFLEX MICROSCOPIC
Bilirubin Urine: NEGATIVE
GLUCOSE, UA: NEGATIVE mg/dL
HGB URINE DIPSTICK: NEGATIVE
KETONES UR: NEGATIVE mg/dL
Leukocytes, UA: NEGATIVE
Nitrite: NEGATIVE
PROTEIN: NEGATIVE mg/dL
Specific Gravity, Urine: 1.01 (ref 1.005–1.030)
Urobilinogen, UA: 1 mg/dL (ref 0.0–1.0)
pH: 5 (ref 5.0–8.0)

## 2014-08-22 LAB — BASIC METABOLIC PANEL
Anion gap: 16 — ABNORMAL HIGH (ref 5–15)
BUN: 14 mg/dL (ref 6–23)
CO2: 22 meq/L (ref 19–32)
Calcium: 9.8 mg/dL (ref 8.4–10.5)
Chloride: 104 mEq/L (ref 96–112)
Creatinine, Ser: 1.15 mg/dL — ABNORMAL HIGH (ref 0.50–1.10)
GFR calc Af Amer: 63 mL/min — ABNORMAL LOW (ref >90)
GFR calc non Af Amer: 55 mL/min — ABNORMAL LOW (ref >90)
GLUCOSE: 103 mg/dL — AB (ref 70–99)
Potassium: 4.1 mEq/L (ref 3.7–5.3)
SODIUM: 142 meq/L (ref 137–147)

## 2014-08-22 LAB — CBC
HCT: 35.1 % — ABNORMAL LOW (ref 36.0–46.0)
HEMOGLOBIN: 11.6 g/dL — AB (ref 12.0–15.0)
MCH: 27.1 pg (ref 26.0–34.0)
MCHC: 33 g/dL (ref 30.0–36.0)
MCV: 82 fL (ref 78.0–100.0)
Platelets: 294 10*3/uL (ref 150–400)
RBC: 4.28 MIL/uL (ref 3.87–5.11)
RDW: 14.2 % (ref 11.5–15.5)
WBC: 4.9 10*3/uL (ref 4.0–10.5)

## 2014-08-22 LAB — I-STAT TROPONIN, ED: Troponin i, poc: 0 ng/mL (ref 0.00–0.08)

## 2014-08-22 LAB — PROTIME-INR
INR: 1.09 (ref 0.00–1.49)
Prothrombin Time: 14.1 seconds (ref 11.6–15.2)

## 2014-08-22 LAB — TROPONIN I

## 2014-08-22 LAB — ABO/RH: ABO/RH(D): O POS

## 2014-08-22 LAB — PRO B NATRIURETIC PEPTIDE: Pro B Natriuretic peptide (BNP): 386.7 pg/mL — ABNORMAL HIGH (ref 0–125)

## 2014-08-22 MED ORDER — MORPHINE SULFATE 4 MG/ML IJ SOLN
4.0000 mg | Freq: Once | INTRAMUSCULAR | Status: AC
  Administered 2014-08-22: 4 mg via INTRAVENOUS
  Filled 2014-08-22: qty 1

## 2014-08-22 MED ORDER — TRAMADOL HCL 50 MG PO TABS: 50.0000 mg | ORAL_TABLET | Freq: Four times a day (QID) | ORAL | Status: DC | PRN

## 2014-08-22 MED ORDER — HYDRALAZINE HCL 50 MG PO TABS
100.0000 mg | ORAL_TABLET | Freq: Two times a day (BID) | ORAL | Status: DC
  Administered 2014-08-22 – 2014-08-24 (×5): 100 mg via ORAL
  Filled 2014-08-22 (×8): qty 2

## 2014-08-22 MED ORDER — SODIUM CHLORIDE 0.9 % IJ SOLN: 3.0000 mL | INTRAMUSCULAR | Status: DC | PRN

## 2014-08-22 MED ORDER — ALPRAZOLAM 0.25 MG PO TABS
0.2500 mg | ORAL_TABLET | Freq: Three times a day (TID) | ORAL | Status: DC | PRN
  Administered 2014-08-22 – 2014-08-24 (×3): 0.25 mg via ORAL
  Filled 2014-08-22 (×3): qty 1

## 2014-08-22 MED ORDER — IOHEXOL 350 MG/ML SOLN
100.0000 mL | Freq: Once | INTRAVENOUS | Status: AC | PRN
  Administered 2014-08-22: 100 mL via INTRAVENOUS

## 2014-08-22 MED ORDER — LABETALOL HCL 300 MG PO TABS
300.0000 mg | ORAL_TABLET | Freq: Two times a day (BID) | ORAL | Status: DC
  Administered 2014-08-22 – 2014-08-24 (×4): 300 mg via ORAL
  Filled 2014-08-22 (×8): qty 1

## 2014-08-22 MED ORDER — ONDANSETRON HCL 4 MG/2ML IJ SOLN
4.0000 mg | Freq: Once | INTRAMUSCULAR | Status: AC
  Administered 2014-08-22: 4 mg via INTRAVENOUS
  Filled 2014-08-22: qty 2

## 2014-08-22 MED ORDER — SODIUM CHLORIDE 0.9 % IJ SOLN
3.0000 mL | Freq: Two times a day (BID) | INTRAMUSCULAR | Status: DC
  Administered 2014-08-22: 3 mL via INTRAVENOUS

## 2014-08-22 MED ORDER — ACETAMINOPHEN 325 MG PO TABS: 650.0000 mg | ORAL_TABLET | Freq: Four times a day (QID) | ORAL | Status: DC | PRN

## 2014-08-22 MED ORDER — CHLORHEXIDINE GLUCONATE 4 % EX LIQD
Freq: Once | CUTANEOUS | Status: DC
  Filled 2014-08-22: qty 15

## 2014-08-22 MED ORDER — ONDANSETRON HCL 4 MG/2ML IJ SOLN: 4.0000 mg | Freq: Four times a day (QID) | INTRAMUSCULAR | Status: DC | PRN

## 2014-08-22 MED ORDER — ONDANSETRON HCL 4 MG PO TABS: 4.0000 mg | ORAL_TABLET | Freq: Four times a day (QID) | ORAL | Status: DC | PRN

## 2014-08-22 MED ORDER — LORAZEPAM 2 MG/ML IJ SOLN
1.0000 mg | Freq: Once | INTRAMUSCULAR | Status: AC
  Administered 2014-08-22: 1 mg via INTRAVENOUS
  Filled 2014-08-22: qty 1

## 2014-08-22 MED ORDER — ACETAMINOPHEN 650 MG RE SUPP: 650.0000 mg | Freq: Four times a day (QID) | RECTAL | Status: DC | PRN

## 2014-08-22 MED ORDER — SODIUM CHLORIDE 0.9 % IV SOLN: 250.0000 mL | INTRAVENOUS | Status: DC | PRN

## 2014-08-22 MED ORDER — SODIUM CHLORIDE 0.9 % IJ SOLN
3.0000 mL | Freq: Two times a day (BID) | INTRAMUSCULAR | Status: DC
  Administered 2014-08-22 – 2014-08-25 (×5): 3 mL via INTRAVENOUS

## 2014-08-22 MED ORDER — SERTRALINE HCL 100 MG PO TABS
100.0000 mg | ORAL_TABLET | Freq: Every day | ORAL | Status: DC
  Administered 2014-08-23 – 2014-08-24 (×2): 100 mg via ORAL
  Filled 2014-08-22 (×3): qty 1

## 2014-08-22 MED ORDER — GABAPENTIN 100 MG PO CAPS
200.0000 mg | ORAL_CAPSULE | Freq: Three times a day (TID) | ORAL | Status: DC
  Administered 2014-08-24 (×3): 200 mg via ORAL
  Filled 2014-08-22 (×6): qty 2

## 2014-08-22 MED ORDER — ATORVASTATIN CALCIUM 20 MG PO TABS
20.0000 mg | ORAL_TABLET | Freq: Every day | ORAL | Status: DC
  Administered 2014-08-24: 20 mg via ORAL
  Filled 2014-08-22 (×2): qty 1

## 2014-08-22 MED ORDER — DOCUSATE SODIUM 100 MG PO CAPS
100.0000 mg | ORAL_CAPSULE | Freq: Every day | ORAL | Status: DC | PRN
  Filled 2014-08-22: qty 1

## 2014-08-22 MED ORDER — PANTOPRAZOLE SODIUM 40 MG PO TBEC
40.0000 mg | DELAYED_RELEASE_TABLET | Freq: Every day | ORAL | Status: DC
  Administered 2014-08-23: 40 mg via ORAL
  Filled 2014-08-22: qty 1

## 2014-08-22 MED ORDER — HYDROCODONE-ACETAMINOPHEN 5-325 MG PO TABS
1.0000 | ORAL_TABLET | ORAL | Status: DC | PRN
  Administered 2014-08-22 – 2014-08-24 (×7): 1 via ORAL
  Filled 2014-08-22: qty 1
  Filled 2014-08-22: qty 2
  Filled 2014-08-22 (×4): qty 1

## 2014-08-22 NOTE — ED Provider Notes (Addendum)
CSN: [id number]     Arrival date & time 08/22/14  1313 History   First MD Initiated Contact with Patient 08/22/14 1331     Chief Complaint  Patient presents with  . Chest Pain     (Consider location/radiation/quality/duration/timing/severity/associated sxs/prior Treatment) The history is provided by the patient and a relative.  Charlotte [name] is a 48 y.o. female hx of HTN, recent aortic dissection here with anxiety, chest pain. She started feeling unwell since last night. She has been having heart fluttering at home and has been feeling very anxious. 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 She took her BP meds this morning. She has some shortness of breath and was noted by EMS to be very anxious. She was seen in the ED three times last month for similar symptoms and had CT that showed stable dissection. However, daughter states that she seemed more uncomfortable than last several times.   Level V caveat- stroke    Past Medical History  Diagnosis Date  . Hypertension   . Family history of anesthesia complication     "alot of back pains from the epidurals"  . Complication of anesthesia     "alot of back pains from the epidurals"  . Anemia   . Depression   . GERD (gastroesophageal reflux disease)   . Daily headache   . Migraine     "sometimes twice/day" (07/05/2014)  . Stroke 06/29/2014    residual BLE weakness  . 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 Polycystic kidney disease     /notes 07/05/2014  . 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 Arthritis     "shoulders" (07/05/2014)  . Bipolar disorder   . Anxiety    Past Surgical History  Procedure Laterality Date  . Cesarean section  1986  . Tubal ligation  1986  . Spinal drain placement  06/29/2014  . Spinal drain removal  07/02/2014  . Thoracentesis  07/05/2014    [name] 07/05/2014   Family History  Problem  Relation Age of Onset  . Stroke Father   . Diabetes type II Other    History  Substance Use Topics  . Smoking status: Former Smoker -- 1.00 packs/day for 37 years    Types: Cigarettes  . Smokeless tobacco: Never Used  . 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  All other systems reviewed and are negative.     Allergies  Seroquel and Olanzapine  Home Medications   Prior to Admission medications   Medication Sig Start Date End Date Taking? Authorizing Provider  ALPRAZolam (XANAX) 0.25 MG tablet Take 1 tablet (0.25 mg total) by mouth 3 (three) times daily as needed for anxiety. 07/25/14  Yes Evlyn Kanner Love, PA-C  atorvastatin (LIPITOR) 20 MG tablet Take 1 tablet (20 mg total) by mouth daily at 6 PM. 07/25/14  Yes Evlyn Kanner Love, PA-C  bisacodyl (DULCOLAX) 10 MG suppository Place 1 suppository (10 mg total) rectally daily at 6 (six) AM. For bowel program. 07/25/14  Yes Evlyn Kanner Love, PA-C  famotidine (PEPCID) 20 MG tablet Take 1 tablet (20  mg total) by mouth 2 (two) times daily. 07/25/14  Yes Evlyn KannerPamela S Love, PA-C  feeding supplement, ENSURE COMPLETE, (ENSURE COMPLETE) LIQD Take 237 mLs by mouth 3 (three) times daily between meals. 07/08/14  Yes Christiane Haorinna L Sullivan, MD  gabapentin (NEURONTIN) 100 MG capsule Take 2 capsules (200 mg total) by mouth 3 (three) times daily. 07/25/14  Yes Evlyn KannerPamela S Love, PA-C  hydrALAZINE (APRESOLINE) 100 MG tablet Take 100 mg by mouth 2 (two) times daily. 07/25/14  Yes Evlyn KannerPamela S Love, PA-C  hydrochlorothiazide (MICROZIDE) 12.5 MG capsule Take 1 capsule (12.5 mg total) by mouth daily. 07/25/14  Yes Evlyn KannerPamela S Love, PA-C  labetalol (NORMODYNE) 300 MG tablet Take 1 tablet (300 mg total) by mouth 2 (two) times daily. 07/25/14  Yes Evlyn KannerPamela S Love, PA-C  methocarbamol (ROBAXIN) 500 MG tablet Take 1 tablet  (500 mg total) by mouth every 6 (six) hours as needed for muscle spasms. 07/25/14  Yes Evlyn KannerPamela S Love, PA-C  polyethylene glycol (MIRALAX / GLYCOLAX) packet Take 17 g by mouth daily after supper. 07/25/14  Yes Evlyn KannerPamela S Love, PA-C  potassium chloride SA (K-DUR,KLOR-CON) 20 MEQ tablet Take 1 tablet (20 mEq total) by mouth 2 (two) times daily. 07/25/14  Yes Evlyn KannerPamela S Love, PA-C  sertraline (ZOLOFT) 100 MG tablet Take 1 tablet (100 mg total) by mouth daily. 07/25/14  Yes Evlyn KannerPamela S Love, PA-C  traMADol (ULTRAM) 50 MG tablet Take 1 tablet (50 mg total) by mouth every 6 (six) hours as needed for moderate pain. 07/25/14  Yes Pamela S Love, PA-C   BP 134/95  Pulse 59  Temp(Src) 97.7 F (36.5 C) (Oral)  Resp 18  Ht 5\' 6"  (1.676 m)  Wt 140 lb (63.504 kg)  BMI 22.61 kg/m2  SpO2 99% Physical Exam  Nursing note and vitals reviewed. Constitutional: [name], place, and time.  Anxious, uncomfortable   HENT:  Head: Normocephalic.  Mouth/Throat: Oropharynx is clear and moist.  Eyes: Conjunctivae are normal. Pupils are equal, round, and reactive to light.  Neck: Normal range of motion. Neck supple.  Cardiovascular: Normal rate, regular rhythm and normal heart sounds.   Pulmonary/Chest: Effort normal.  Dec breath sounds bilateral bases   Abdominal: Soft. Bowel sounds are normal. She exhibits no distension. There is no tenderness. There is no rebound.  Musculoskeletal: Normal range of motion.  1+ edema bilaterally   Neurological: She is alert and oriented to person, place, and time. No cranial nerve deficit. Coordination normal.  Skin: Skin is warm and dry.  Psychiatric: She has a normal mood and affect. Her behavior is normal. Judgment and thought content normal.    ED Course  Procedures (including critical care time) Labs Review Labs Reviewed  CBC - Abnormal; Notable for the following:    Hemoglobin 11.6 (*)    HCT 35.1 (*)    All other components within normal limits  BASIC METABOLIC  PANEL - Abnormal; Notable for the following:    Glucose, Bld 103 (*)    Creatinine, Ser 1.15 (*)    GFR calc non Af Amer 55 (*)    GFR calc Af Amer 63 (*)    Anion gap 16 (*)    All other components within normal limits  PRO B NATRIURETIC PEPTIDE - Abnormal; Notable for the following:    Pro B Natriuretic peptide (BNP) 386.7 (*)    All other components within normal limits  TROPONIN I  I-STAT TROPOININ, ED    Imaging Review Dg Chest  Port 1 View  08/22/2014   CLINICAL DATA:  Chest pain and cardiac palpitations; history of thoracic aortic intramural hematoma  EXAM: PORTABLE CHEST - 1 VIEW  COMPARISON:  August 04, 2014 chest radiograph and chest CT August 01, 2014  FINDINGS: Prominence of the descending thoracic aorta remain stable. There is no edema or consolidation. Heart size and pulmonary vascular normal. No adenopathy. No pneumothorax. No bone lesions.  IMPRESSION: Persistent prominence of the descending thoracic aorta. No change in cardiac silhouette. No edema or consolidation.   Electronically Signed   By: Bretta Bang M.D.   On: 08/22/2014 13:50   Ct Angio Chest Aorta W/cm &/or Wo/cm  08/22/2014   CLINICAL DATA:  Chest pain  EXAM: CT ANGIOGRAPHY CHEST, ABDOMEN AND PELVIS  TECHNIQUE: Multidetector CT imaging through the chest, abdomen and pelvis was performed using the standard protocol during bolus administration of intravenous contrast. Multiplanar reconstructed images and MIPs were obtained and reviewed to evaluate the vascular anatomy.  CONTRAST:  OMNIPAQUE IOHEXOL 350 MG/ML SOLN  COMPARISON:  08/01/2014  FINDINGS: CTA CHEST FINDINGS  The descending aortic dissection is again noted. It has significantly changed in morphology since the prior study. Within the proximal descending aorta, there is no a large fenestration with contrast opacifying the false lumen posteriorly. The AP diameter of the aorta has increased to 3.7 cm. The area of all opacification in the false lumen  measures 4.7 cm in vertical length. Superior to the pooling of contrast, the false lumen is thrombosed. There is also a focal area of contrast opacification in the false lumen on image 102 of series 501 which is not significantly changed.  Great vessels are patent within the confines of the exam. There is no evidence of extension of the dissection into the left subclavian artery. Bilateral vertebral arteries are patent within the confines of the exam. Bilateral common carotid arteries are patent.  There is no evidence of ascending aortic dissection.  Small bilateral effusions.  Dependent atelectasis bilaterally.  Stable nodule in the left breast on image 73 of series 501.  Review of the MIP images confirms the above findings.  CTA ABDOMEN AND PELVIS FINDINGS  Within the abdomen, there are 2 areas of contrast opacification within the false lumen posteriorly on images 157 and 169. These areas may represent back filling from the lumbar arteries. They are not significantly changed. Beyond these areas of opacification in the false lumen, the dissection has largely regressed with resolution of the intramural hematoma.  Stable mild narrowing at the origin of the celiac axis. Branch vessels are patent. Accessory left hepatic artery  SMA is patent.  Branch vessels are patent.  Mild disease at the origin of the IMA.  Branch vessels are patent.  Single renal arteries are patent.  Bilateral common, internal, and external iliac arteries are patent with mild atherosclerotic changes.  Innumerable cysts in the liver and kidneys are stable. Stable calculus in the lower pole of the left kidney.  Gallbladder, spleen, pancreas, adrenal glands are within normal limits.  Uterine fibroids.  Bladder is within normal limits.  No free-fluid.  No obvious abnormal retroperitoneal adenopathy.  Review of the MIP images confirms the above findings.  IMPRESSION: Compared to the prior study, the morphology of the dissection in the proximal  descending thoracic aorta has changed. It is now fenestrated with a large opening with contrast opacification of a 4.7 cm vertical segment of the proximal thoracic aorta. The aorta has also expanded in the AP diameter,  now within AP diameter of 3.7 cm. This is likely the source of the patient's new onset chest pain. Urgent correction of hypertension is recommended.  There is a focal area of contrast opacification in the distal thoracic aorta false lumen and areas within the abdomen that are not significantly changed.  Abdominal organs are stable.  Bibasilar atelectasis and small pleural effusions.   Electronically Signed   By: Maryclare Bean M.D.   On: 08/22/2014 15:45   Ct Cta Abd/pel W/cm &/or W/o Cm  08/22/2014   CLINICAL DATA:  Chest pain  EXAM: CT ANGIOGRAPHY CHEST, ABDOMEN AND PELVIS  TECHNIQUE: Multidetector CT imaging through the chest, abdomen and pelvis was performed using the standard protocol during bolus administration of intravenous contrast. Multiplanar reconstructed images and MIPs were obtained and reviewed to evaluate the vascular anatomy.  CONTRAST:  OMNIPAQUE IOHEXOL 350 MG/ML SOLN  COMPARISON:  08/01/2014  FINDINGS: CTA CHEST FINDINGS  The descending aortic dissection is again noted. It has significantly changed in morphology since the prior study. Within the proximal descending aorta, there is no a large fenestration with contrast opacifying the false lumen posteriorly. The AP diameter of the aorta has increased to 3.7 cm. The area of all opacification in the false lumen measures 4.7 cm in vertical length. Superior to the pooling of contrast, the false lumen is thrombosed. There is also a focal area of contrast opacification in the false lumen on image 102 of series 501 which is not significantly changed.  Great vessels are patent within the confines of the exam. There is no evidence of extension of the dissection into the left subclavian artery. Bilateral vertebral arteries are patent  within the confines of the exam. Bilateral common carotid arteries are patent.  There is no evidence of ascending aortic dissection.  Small bilateral effusions.  Dependent atelectasis bilaterally.  Stable nodule in the left breast on image 73 of series 501.  Review of the MIP images confirms the above findings.  CTA ABDOMEN AND PELVIS FINDINGS  Within the abdomen, there are 2 areas of contrast opacification within the false lumen posteriorly on images 157 and 169. These areas may represent back filling from the lumbar arteries. They are not significantly changed. Beyond these areas of opacification in the false lumen, the dissection has largely regressed with resolution of the intramural hematoma.  Stable mild narrowing at the origin of the celiac axis. Branch vessels are patent. Accessory left hepatic artery  SMA is patent.  Branch vessels are patent.  Mild disease at the origin of the IMA.  Branch vessels are patent.  Single renal arteries are patent.  Bilateral common, internal, and external iliac arteries are patent with mild atherosclerotic changes.  Innumerable cysts in the liver and kidneys are stable. Stable calculus in the lower pole of the left kidney.  Gallbladder, spleen, pancreas, adrenal glands are within normal limits.  Uterine fibroids.  Bladder is within normal limits.  No free-fluid.  No obvious abnormal retroperitoneal adenopathy.  Review of the MIP images confirms the above findings.  IMPRESSION: Compared to the prior study, the morphology of the dissection in the proximal descending thoracic aorta has changed. It is now fenestrated with a large opening with contrast opacification of a 4.7 cm vertical segment of the proximal thoracic aorta. The aorta has also expanded in the AP diameter, now within AP diameter of 3.7 cm. This is likely the source of the patient's new onset chest pain. Urgent correction of hypertension is recommended.  There  is a focal area of contrast opacification in the distal  thoracic aorta false lumen and areas within the abdomen that are not significantly changed.  Abdominal organs are stable.  Bibasilar atelectasis and small pleural effusions.   Electronically Signed   By: Maryclare Bean M.D.   On: 08/22/2014 15:45     EKG Interpretation   Date/Time:  Thursday August 22 2014 13:21:17 EDT Ventricular Rate:  65 PR Interval:  118 QRS Duration: 82 QT Interval:  443 QTC Calculation: 461 R Axis:   64 Text Interpretation:  Sinus rhythm Borderline short PR interval Probable  left atrial enlargement Probable LVH with secondary repol abnrm No  significant change since last tracing Confirmed by Jon Lall  MD, Shanicka Oldenkamp ([id number])  on 08/22/2014 1:30:04 PM      MDM   Final diagnoses:  None    Charlotte [name] is a 48 y.o. female here with chest pain, anxiety. Consider recent hx of dissection and BP in the 150s in the ED, will repeat CT angio. Will give ativan and reassess.   4:16 PM CT showed that thoracic aorta has expanded and morphology changed. I called Dr. Donata Clay, who reviewed images. Recommend admission for monitoring. After pain meds and ativan, BP dec to 120s. Will observe in step down.   5:05 PM I talked to medicine, Dr. Cena Benton, who discussed with Dr. Zenaida Niece Trigt's nurse. He said that cardiothoracic will admit. Will await cardiothoracic evaluation and likely admission for observation.   Richardean Canal, MD 08/22/14 1642  Richardean Canal, MD 08/22/14 [phone]

## 2014-08-22 NOTE — ED Notes (Signed)
Spoke with CT. Patient no longer has periods and has completed menopause. Daughter at the bedside confirmed.

## 2014-08-22 NOTE — ED Notes (Signed)
Patient returned from CT

## 2014-08-22 NOTE — ED Notes (Signed)
Surgeon at bedside.  

## 2014-08-22 NOTE — ED Notes (Signed)
Per EMS: Pt reports intermittent 8/10 cp on 08/21/14 that radiated to whole chest. Pt states that she has been having palpitations, upon arrival EMS noted pt was anxious and hyperventilating at home. Pt also reports some nausea, pt paralyzed in right leg from previous stroke. Pt tearful and hyperventilating upon arrival. EMS noted unremarkable EKG.

## 2014-08-22 NOTE — ED Notes (Signed)
Charlotte HatchetSheila in main lab will add on troponin to blood work in lab

## 2014-08-22 NOTE — H&P (Signed)
[address]       [place] [id number]             [phone]        Charlotte RobertHelene J Klinker [facility] Medical Record #[id number] Date of Birth: Dec 10, 1965  Referring: ER MD Primary Care: Willey BladeEAN, ERIC, MD  Chief Complaint:    Chief Complaint  Patient presents with  . Chest Pain    History of Present Illness:     This is a 48 year old African American woman, previously known to Dr. 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 She was first admitted in August 2015 for this. 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 She was discharged from the hospital on 07/25/14. Approximately four days later, she went to [facility] with a chief complaint of chest pain again. She ruled out for myocardial infarction. She was discharged on 07/31/49. She returned to the emergency room again on 08/02/14 with chest pain and was evaluated and released. Since discharge from the hospital, she has not yet started home physical therapy. Apparently, her referrals are in process for home health nurse and physical therapist to help her with leg strength.  She has had some mild lightheaded episodes. She has not had syncope. She has a history of tobacco abuse but stopped smoking when she had her aortic dissection and her stroke last month. She has had some atypical left-sided chest discomfort under the left lateral breast. According to the daughter, associated with this was shortness of breath and diaphoresis. EKG apparently showed SR, probable LVH and LA enlargement. Initial Troponin <0.30.CT angio of the chest, abdomen, and pelvis showed dissection in the proximal descending thoracic aorta has changed and is now fenestrated with a large opening with contrast opacification of a 4.7 cm vertical segment of the proximal thoracic aorta. The aorta has also expanded in the AP diameter, now within AP diameter of 3.7 cm. She  has bibasilar atelectasis and small bilateral pleural effusions. In addition, there are innumerable cysts in the liver and kidneys are stable. Stable calculus in the lower pole of the left kidney.Dr. Donata ClayVan Trigt has been asked to admit the patient for further evaluation and management.Her vital signs are stable (BP 155/84 upon admission and is now 120/72), heart rate in the 50-60's, oxygen saturation 99% on room air).    Current Activity/ Functional Status: According to patient's daughter, she need assistance with nearly all of her ADL's and with ambulation (paraparesis of lower extremities).   Zubrod Score: At the time of surgery this patient's most appropriate activity status/level should be described as: []     0    Normal activity, no symptoms []     1    Restricted in physical strenuous activity but ambulatory, able to do out light work []     2    Ambulatory and capable of self care, unable to do work activities, up and about                 more than 50%  Of the time                            []     3    Only limited self care, in bed greater than 50% of waking hours [x]     4    Completely disabled, no self care,  confined to bed or chair []     5    Moribund  Past Medical History  Diagnosis Date  . Hypertension   . Family history of anesthesia complication     "alot of back pains from the epidurals"  . Complication of anesthesia     "alot of back pains from the epidurals"  . Anemia   . Depression   . GERD (gastroesophageal reflux disease)   . Daily headache   . Migraine     "sometimes twice/day" (07/05/2014)  . Stroke 06/29/2014    residual BLE weakness  . 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 Polycystic kidney disease     /notes 07/05/2014  . 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 Arthritis     "shoulders" (07/05/2014)  . Bipolar disorder   . Anxiety     Past Surgical History    Procedure Laterality Date  . Cesarean section  1986  . Tubal ligation  1986  . Spinal drain placement  06/29/2014  . Spinal drain removal  07/02/2014  . Thoracentesis  07/05/2014    [name] 07/05/2014    History  Smoking status  . Former Smoker -- 1.00 packs/day for 37 years  . Types: Cigarettes  Smokeless tobacco  . Never Used   History  Alcohol Use  . Yes    Comment: 07/05/2014 "recovering alcoholic since 2013"    History   Social History  . Marital Status: Single    Spouse Name: N/A    Number of Children: N/A  . Years of Education: N/A    Social History Main Topics  . Smoking status: Former Smoker -- 1.00 packs/day for 37 years    Types: Cigarettes  . Smokeless tobacco: Never Used  . Alcohol Use: Yes     Comment: 07/05/2014 "recovering alcoholic since 2013"  . Drug Use: Yes    Special: "Crack" cocaine     Comment: 07/05/2014 "recovering cocaine addict since 2013"  . Sexual Activity: Not on file    Allergies  Allergen Reactions  . Seroquel [Quetiapine] Other (See Comments)    Night sweats, dizziness and possible confusion  . Olanzapine Nausea And Vomiting    No current facility-administered medications for this encounter.   Current Outpatient Prescriptions  Medication Sig Dispense Refill  . ALPRAZolam (XANAX) 0.25 MG tablet Take 1 tablet (0.25 mg total) by mouth 3 (three) times daily as needed for anxiety.  30 tablet  0  . atorvastatin (LIPITOR) 20 MG tablet Take 1 tablet (20 mg total) by mouth daily at 6 PM.  30 tablet  1  . bisacodyl (DULCOLAX) 10 MG suppository Place 1 suppository (10 mg total) rectally daily at 6 (six) AM. For bowel program.  30 suppository  0  . famotidine (PEPCID) 20 MG tablet Take 1 tablet (20 mg total) by mouth 2 (two) times daily.  60 tablet  1  . feeding supplement, ENSURE COMPLETE, (ENSURE COMPLETE) LIQD Take 237 mLs by mouth 3 (three) times daily between meals.      . gabapentin (NEURONTIN) 100 MG capsule Take 2 capsules (200 mg total)  by mouth 3 (three) times daily.  180 capsule  1  . hydrALAZINE (APRESOLINE) 100 MG tablet Take 100 mg by mouth 2 (two) times daily.      . hydrochlorothiazide (MICROZIDE) 12.5 MG capsule Take 1 capsule (12.5 mg total) by mouth daily.  30  capsule  1  . labetalol (NORMODYNE) 300 MG tablet Take 1 tablet (300 mg total) by mouth 2 (two) times daily.  60 tablet  1  . methocarbamol (ROBAXIN) 500 MG tablet Take 1 tablet (500 mg total) by mouth every 6 (six) hours as needed for muscle spasms.  60 tablet  0  . polyethylene glycol (MIRALAX / GLYCOLAX) packet Take 17 g by mouth daily after supper.  30 each  0  . potassium chloride SA (K-DUR,KLOR-CON) 20 MEQ tablet Take 1 tablet (20 mEq total) by mouth 2 (two) times daily.  60 tablet  1  . sertraline (ZOLOFT) 100 MG tablet Take 1 tablet (100 mg total) by mouth daily.  30 tablet  1  . traMADol (ULTRAM) 50 MG tablet Take 1 tablet (50 mg total) by mouth every 6 (six) hours as needed for moderate pain.  45 tablet  0     (Not in a hospital admission)  Family History  Problem Relation Age of Onset  . Stroke Father   . Diabetes type II Other      Review of Systems:     Cardiac Review of Systems: Y or N  Chest Pain [   Y ]  Resting SOB [ N  ] Exertional SOB  [ Y ]  Orthopnea [ N ]   Pedal Edema [ N  ]    Palpitations Klaus.Mock  ] Syncope  Klaus.Mock  ]   Presyncope [  N ]  General Review of Systems: [Y] = yes [ N ]=no Constitional: recent weight change [ N ]; anorexia [ N ]; fatigue [ Y ]; nausea Klaus.Mock  ]; night sweats [ N ]; fever Klaus.Mock  ]; or chills [ N ]                                                Eye : blurred vision Klaus.Mock  ]; diplopia [  N ]; vision changes Klaus.Mock  ];  Amaurosis fugax[  N]; Resp: cough [ N ];  wheezing[ N ];  hemoptysis[N  ];  UE:AVWUJWJX[ N ];  dysphagia[ N ]; melena[N  ];  hematochezia Klaus.Mock  ]; heartburn[ Y ];  GU: kidney stones Klaus.Mock  ]; hematuria[ N ];   dysuria [  N];  nocturia[N  ];   urinary frequency Klaus.Mock  ]             Skin: rash, swelling[N  ];, hair loss[  N ];  peripheral edema[N  ];  or itching[ N ]; Musculosketetal: myalgias[ N ];  joint swelling[  N];  joint erythema[N  ]; joint pain[ N ];  back pain[ N ];  Heme/Lymph: bruising[  N];  bleeding[ N ];  anemia[Y  ];  Neuro: TIA[ N ];  headaches[N  ]; vertigo[ N ];  seizures[N  ];  difficulty walking[Y  ];  Psych:depression[ Y ]; anxiety[Y  ];   Endocrine: diabetes[ N ];  thyroid dysfunction[ N ];   Physical Exam: BP 120/72  Pulse 61  Temp(Src) 97.7 F (36.5 C) (Oral)  Resp 14  Ht 5\' 6"  (1.676 m)  Wt 140 lb (63.504 kg)  BMI 22.61 kg/m2  SpO2 99%  General appearance: alert, cooperative and no distress HEENT: Head-atraumatic, normocephalic Eyes-EOMI, PERRLA, sclera are non icteric Neck-supple Heart: RRR Lungs: clear to auscultation bilaterally Abdomen: soft, non-tender; bowel  sounds normal; no masses,  no organomegaly Extremities: No lower extremity edema, DP/PT palpable bilaterally, both LE warm Neurologic: Sensory is intact; she is able to wiggle both toes. She is unable to lift her legs without assistance. Motor/sensory mostly  intact upper extremities  Diagnostic Studies & Laboratory data:     Recent Radiology Findings:   Dg Chest Port 1 View  08/22/2014   CLINICAL DATA:  Chest pain and cardiac palpitations; history of thoracic aortic intramural hematoma  EXAM: PORTABLE CHEST - 1 VIEW  COMPARISON:  August 04, 2014 chest radiograph and chest CT August 01, 2014  FINDINGS: Prominence of the descending thoracic aorta remain stable. There is no edema or consolidation. Heart size and pulmonary vascular normal. No adenopathy. No pneumothorax. No bone lesions.  IMPRESSION: Persistent prominence of the descending thoracic aorta. No change in cardiac silhouette. No edema or consolidation.   Electronically Signed   By: Bretta Bang M.D.   On: 08/22/2014 13:50   Ct Angio Chest Aorta W/cm &/or Wo/cm  08/22/2014   CLINICAL DATA:  Chest pain  EXAM: CT ANGIOGRAPHY CHEST, ABDOMEN AND  PELVIS  TECHNIQUE: Multidetector CT imaging through the chest, abdomen and pelvis was performed using the standard protocol during bolus administration of intravenous contrast. Multiplanar reconstructed images and MIPs were obtained and reviewed to evaluate the vascular anatomy.  CONTRAST:  OMNIPAQUE IOHEXOL 350 MG/ML SOLN  COMPARISON:  08/01/2014  FINDINGS: CTA CHEST FINDINGS  The descending aortic dissection is again noted. It has significantly changed in morphology since the prior study. Within the proximal descending aorta, there is no a large fenestration with contrast opacifying the false lumen posteriorly. The AP diameter of the aorta has increased to 3.7 cm. The area of all opacification in the false lumen measures 4.7 cm in vertical length. Superior to the pooling of contrast, the false lumen is thrombosed. There is also a focal area of contrast opacification in the false lumen on image 102 of series 501 which is not significantly changed.  Great vessels are patent within the confines of the exam. There is no evidence of extension of the dissection into the left subclavian artery. Bilateral vertebral arteries are patent within the confines of the exam. Bilateral common carotid arteries are patent.  There is no evidence of ascending aortic dissection.  Small bilateral effusions.  Dependent atelectasis bilaterally.  Stable nodule in the left breast on image 73 of series 501.  Review of the MIP images confirms the above findings.  CTA ABDOMEN AND PELVIS FINDINGS  Within the abdomen, there are 2 areas of contrast opacification within the false lumen posteriorly on images 157 and 169. These areas may represent back filling from the lumbar arteries. They are not significantly changed. Beyond these areas of opacification in the false lumen, the dissection has largely regressed with resolution of the intramural hematoma.  Stable mild narrowing at the origin of the celiac axis. Branch vessels are patent.  Accessory left hepatic artery  SMA is patent.  Branch vessels are patent.  Mild disease at the origin of the IMA.  Branch vessels are patent.  Single renal arteries are patent.  Bilateral common, internal, and external iliac arteries are patent with mild atherosclerotic changes.  Innumerable cysts in the liver and kidneys are stable. Stable calculus in the lower pole of the left kidney.  Gallbladder, spleen, pancreas, adrenal glands are within normal limits.  Uterine fibroids.  Bladder is within normal limits.  No free-fluid.  No obvious abnormal retroperitoneal adenopathy.  Review of the MIP images confirms the above findings.  IMPRESSION: Compared to the prior study, the morphology of the dissection in the proximal descending thoracic aorta has changed. It is now fenestrated with a large opening with contrast opacification of a 4.7 cm vertical segment of the proximal thoracic aorta. The aorta has also expanded in the AP diameter, now within AP diameter of 3.7 cm. This is likely the source of the patient's new onset chest pain. Urgent correction of hypertension is recommended.  There is a focal area of contrast opacification in the distal thoracic aorta false lumen and areas within the abdomen that are not significantly changed.  Abdominal organs are stable.  Bibasilar atelectasis and small pleural effusions.   Electronically Signed   By: Maryclare Bean M.D.   On: 08/22/2014 15:45    Recent Lab Findings: Lab Results  Component Value Date   WBC 4.9 08/22/2014   HGB 11.6* 08/22/2014   HCT 35.1* 08/22/2014   PLT 294 08/22/2014   GLUCOSE 103* 08/22/2014   CHOL 201* 06/30/2014   TRIG 96 06/30/2014   HDL 53 06/30/2014   LDLCALC 657* 06/30/2014   ALT 34 08/04/2014   AST 33 08/04/2014   NA 142 08/22/2014   K 4.1 08/22/2014   CL 104 08/22/2014   CREATININE 1.15* 08/22/2014   BUN 14 08/22/2014   CO2 22 08/22/2014   TSH 1.500 07/30/2014   INR 1.11 08/04/2014   HGBA1C 5.7* 06/30/2014     Assessment / Plan:   1. I have discussed  with the patient stent Endovascular repair of Type B thoracic aortic dissection (change in the descending thoracic aortic dissection) tomorrow . 2. Strict blood pressure control 3. History of renal insufficiency  I have seen and examined Charlotte [name] and developed  the above assessment  and plan.  Delight Ovens MD Beeper [phone] Office [phone] 08/22/2014 9:45 PM

## 2014-08-23 ENCOUNTER — Encounter (HOSPITAL_COMMUNITY): Payer: Medicaid Other | Admitting: Certified Registered"

## 2014-08-23 ENCOUNTER — Encounter (HOSPITAL_COMMUNITY)
Admission: EM | Disposition: A | Discharge status: Home Health | Payer: Self-pay | Source: Home / Self Care | Attending: Cardiothoracic Surgery

## 2014-08-23 ENCOUNTER — Encounter (HOSPITAL_COMMUNITY): Payer: Self-pay | Admitting: Certified Registered"

## 2014-08-23 ENCOUNTER — Inpatient Hospital Stay (HOSPITAL_COMMUNITY): Payer: Medicaid Other

## 2014-08-23 ENCOUNTER — Inpatient Hospital Stay (HOSPITAL_COMMUNITY): Payer: Medicaid Other | Admitting: Certified Registered"

## 2014-08-23 DIAGNOSIS — I7101 Dissection of thoracic aorta: Secondary | ICD-10-CM

## 2014-08-23 HISTORY — PX: THORACIC AORTIC ENDOVASCULAR STENT GRAFT: SHX6112

## 2014-08-23 LAB — BASIC METABOLIC PANEL
Anion gap: 12 (ref 5–15)
Anion gap: 11 (ref 5–15)
BUN: 15 mg/dL (ref 6–23)
BUN: 12 mg/dL (ref 6–23)
CALCIUM: 8.8 mg/dL (ref 8.4–10.5)
CO2: 25 mEq/L (ref 19–32)
CO2: 24 mEq/L (ref 19–32)
CREATININE: 1.29 mg/dL — AB (ref 0.50–1.10)
Calcium: 8.8 mg/dL (ref 8.4–10.5)
Chloride: 106 mEq/L (ref 96–112)
Chloride: 107 mEq/L (ref 96–112)
Creatinine, Ser: 1.1 mg/dL (ref 0.50–1.10)
GFR calc Af Amer: 67 mL/min — ABNORMAL LOW (ref >90)
GFR calc non Af Amer: 58 mL/min — ABNORMAL LOW (ref >90)
GFR, EST AFRICAN AMERICAN: 55 mL/min — AB (ref >90)
GFR, EST NON AFRICAN AMERICAN: 47 mL/min — AB (ref >90)
Glucose, Bld: 103 mg/dL — ABNORMAL HIGH (ref 70–99)
Glucose, Bld: 108 mg/dL — ABNORMAL HIGH (ref 70–99)
Potassium: 3.2 mEq/L — ABNORMAL LOW (ref 3.7–5.3)
Potassium: 3.6 mEq/L — ABNORMAL LOW (ref 3.7–5.3)
Sodium: 143 mEq/L (ref 137–147)
Sodium: 142 mEq/L (ref 137–147)

## 2014-08-23 LAB — CBC
HCT: 29.9 % — ABNORMAL LOW (ref 36.0–46.0)
HEMATOCRIT: 29.6 % — AB (ref 36.0–46.0)
Hemoglobin: 9.6 g/dL — ABNORMAL LOW (ref 12.0–15.0)
Hemoglobin: 9.6 g/dL — ABNORMAL LOW (ref 12.0–15.0)
MCH: 26.9 pg (ref 26.0–34.0)
MCH: 26.9 pg (ref 26.0–34.0)
MCHC: 32.4 g/dL (ref 30.0–36.0)
MCHC: 32.1 g/dL (ref 30.0–36.0)
MCV: 82.9 fL (ref 78.0–100.0)
MCV: 83.8 fL (ref 78.0–100.0)
Platelets: 260 10*3/uL (ref 150–400)
Platelets: 242 10*3/uL (ref 150–400)
RBC: 3.57 MIL/uL — ABNORMAL LOW (ref 3.87–5.11)
RBC: 3.57 MIL/uL — ABNORMAL LOW (ref 3.87–5.11)
RDW: 14.3 % (ref 11.5–15.5)
RDW: 14.4 % (ref 11.5–15.5)
WBC: 4.3 10*3/uL (ref 4.0–10.5)
WBC: 4.9 10*3/uL (ref 4.0–10.5)

## 2014-08-23 LAB — PREPARE RBC (CROSSMATCH)

## 2014-08-23 LAB — APTT: aPTT: 40 seconds — ABNORMAL HIGH (ref 24–37)

## 2014-08-23 LAB — MAGNESIUM: Magnesium: 1.5 mg/dL (ref 1.5–2.5)

## 2014-08-23 LAB — PROTIME-INR
INR: 1.18 (ref 0.00–1.49)
Prothrombin Time: 15 seconds (ref 11.6–15.2)

## 2014-08-23 SURGERY — INSERTION, ENDOVASCULAR STENT GRAFT, AORTA, THORACIC
Anesthesia: General

## 2014-08-23 MED ORDER — CEFAZOLIN SODIUM-DEXTROSE 2-3 GM-% IV SOLR
INTRAVENOUS | Status: AC
  Filled 2014-08-23: qty 50

## 2014-08-23 MED ORDER — POTASSIUM CHLORIDE CRYS ER 20 MEQ PO TBCR: 20.0000 meq | EXTENDED_RELEASE_TABLET | Freq: Every day | ORAL | Status: DC | PRN

## 2014-08-23 MED ORDER — DEXTROSE 5 % IV SOLN
1.5000 g | Freq: Two times a day (BID) | INTRAVENOUS | Status: AC
  Administered 2014-08-23 – 2014-08-24 (×2): 1.5 g via INTRAVENOUS
  Filled 2014-08-23 (×2): qty 1.5

## 2014-08-23 MED ORDER — PHENOL 1.4 % MT LIQD: 1.0000 | OROMUCOSAL | Status: DC | PRN

## 2014-08-23 MED ORDER — ONDANSETRON HCL 4 MG/2ML IJ SOLN: 4.0000 mg | Freq: Once | INTRAMUSCULAR | Status: DC | PRN

## 2014-08-23 MED ORDER — NITROGLYCERIN 0.2 MG/ML ON CALL CATH LAB
INTRAVENOUS | Status: DC | PRN
  Administered 2014-08-23 (×3): 40 ug via INTRAVENOUS

## 2014-08-23 MED ORDER — PROPOFOL 10 MG/ML IV BOLUS
INTRAVENOUS | Status: AC
  Filled 2014-08-23: qty 20

## 2014-08-23 MED ORDER — ONDANSETRON HCL 4 MG/2ML IJ SOLN
INTRAMUSCULAR | Status: AC
  Filled 2014-08-23: qty 2

## 2014-08-23 MED ORDER — LACTATED RINGERS IV SOLN: INTRAVENOUS | Status: DC

## 2014-08-23 MED ORDER — GUAIFENESIN-DM 100-10 MG/5ML PO SYRP
15.0000 mL | ORAL_SOLUTION | ORAL | Status: DC | PRN
Start: 2014-08-23 — End: 2014-08-25

## 2014-08-23 MED ORDER — GLYCOPYRROLATE 0.2 MG/ML IJ SOLN
INTRAMUSCULAR | Status: DC | PRN
  Administered 2014-08-23: 0.4 mg via INTRAVENOUS

## 2014-08-23 MED ORDER — DEXTROSE-NACL 5-0.9 % IV SOLN
INTRAVENOUS | Status: DC
  Administered 2014-08-23: 125 mL/h via INTRAVENOUS
  Administered 2014-08-23: 01:00:00 via INTRAVENOUS

## 2014-08-23 MED ORDER — METOPROLOL TARTRATE 1 MG/ML IV SOLN: 2.0000 mg | INTRAVENOUS | Status: DC | PRN

## 2014-08-23 MED ORDER — TRAMADOL HCL 50 MG PO TABS
50.0000 mg | ORAL_TABLET | Freq: Four times a day (QID) | ORAL | Status: DC | PRN
  Administered 2014-08-23 – 2014-08-25 (×4): 50 mg via ORAL
  Filled 2014-08-23 (×4): qty 1

## 2014-08-23 MED ORDER — NEOSTIGMINE METHYLSULFATE 10 MG/10ML IV SOLN
INTRAVENOUS | Status: DC | PRN
  Administered 2014-08-23: 3 mg via INTRAVENOUS

## 2014-08-23 MED ORDER — ONDANSETRON HCL 4 MG/2ML IJ SOLN
4.0000 mg | Freq: Four times a day (QID) | INTRAMUSCULAR | Status: DC | PRN
  Administered 2014-08-23: 4 mg via INTRAVENOUS
  Filled 2014-08-23: qty 2

## 2014-08-23 MED ORDER — ACETAMINOPHEN 325 MG RE SUPP
325.0000 mg | RECTAL | Status: DC | PRN
  Filled 2014-08-23: qty 2

## 2014-08-23 MED ORDER — OXYCODONE HCL 5 MG PO TABS: 5.0000 mg | ORAL_TABLET | Freq: Once | ORAL | Status: DC | PRN

## 2014-08-23 MED ORDER — GLYCOPYRROLATE 0.2 MG/ML IJ SOLN
INTRAMUSCULAR | Status: AC
  Filled 2014-08-23: qty 2

## 2014-08-23 MED ORDER — ROCURONIUM BROMIDE 100 MG/10ML IV SOLN
INTRAVENOUS | Status: DC | PRN
  Administered 2014-08-23: 50 mg via INTRAVENOUS

## 2014-08-23 MED ORDER — SODIUM CHLORIDE 0.9 % IV SOLN: 500.0000 mL | Freq: Once | INTRAVENOUS | Status: AC | PRN

## 2014-08-23 MED ORDER — POTASSIUM CHLORIDE CRYS ER 20 MEQ PO TBCR
20.0000 meq | EXTENDED_RELEASE_TABLET | Freq: Once | ORAL | Status: AC
  Administered 2014-08-23: 20 meq via ORAL
  Filled 2014-08-23: qty 1

## 2014-08-23 MED ORDER — FENTANYL CITRATE 0.05 MG/ML IJ SOLN
INTRAMUSCULAR | Status: DC | PRN
  Administered 2014-08-23: 100 ug via INTRAVENOUS
  Administered 2014-08-23: 50 ug via INTRAVENOUS

## 2014-08-23 MED ORDER — ACETAMINOPHEN 325 MG PO TABS
325.0000 mg | ORAL_TABLET | ORAL | Status: DC | PRN
Start: 2014-08-23 — End: 2014-08-26
  Administered 2014-08-25: 650 mg via ORAL
  Filled 2014-08-23: qty 2

## 2014-08-23 MED ORDER — ALBUMIN HUMAN 5 % IV SOLN
12.5000 g | Freq: Once | INTRAVENOUS | Status: AC
  Administered 2014-08-23: 12.5 g via INTRAVENOUS
  Filled 2014-08-23: qty 250

## 2014-08-23 MED ORDER — MIDAZOLAM HCL 2 MG/2ML IJ SOLN: 2.0000 mg | Freq: Once | INTRAMUSCULAR | Status: DC

## 2014-08-23 MED ORDER — FUROSEMIDE 10 MG/ML IJ SOLN
INTRAMUSCULAR | Status: AC
  Filled 2014-08-23: qty 4

## 2014-08-23 MED ORDER — PROPOFOL 10 MG/ML IV BOLUS
INTRAVENOUS | Status: DC | PRN
  Administered 2014-08-23: 200 mg via INTRAVENOUS

## 2014-08-23 MED ORDER — MIDAZOLAM HCL 2 MG/2ML IJ SOLN
INTRAMUSCULAR | Status: AC
  Administered 2014-08-23: 2 mg
  Filled 2014-08-23: qty 2

## 2014-08-23 MED ORDER — ALUM & MAG HYDROXIDE-SIMETH 200-200-20 MG/5ML PO SUSP: 15.0000 mL | ORAL | Status: DC | PRN

## 2014-08-23 MED ORDER — PROTAMINE SULFATE 10 MG/ML IV SOLN
INTRAVENOUS | Status: DC | PRN
  Administered 2014-08-23: 10 mg via INTRAVENOUS
  Administered 2014-08-23 (×2): 20 mg via INTRAVENOUS

## 2014-08-23 MED ORDER — ONDANSETRON HCL 4 MG/2ML IJ SOLN
INTRAMUSCULAR | Status: DC | PRN
  Administered 2014-08-23: 4 mg via INTRAVENOUS

## 2014-08-23 MED ORDER — POLYETHYLENE GLYCOL 3350 17 G PO PACK
17.0000 g | PACK | Freq: Every day | ORAL | Status: DC | PRN
  Filled 2014-08-23: qty 1

## 2014-08-23 MED ORDER — PANTOPRAZOLE SODIUM 40 MG PO TBEC
40.0000 mg | DELAYED_RELEASE_TABLET | Freq: Every day | ORAL | Status: DC
  Administered 2014-08-24: 40 mg via ORAL
  Filled 2014-08-23: qty 1

## 2014-08-23 MED ORDER — LACTATED RINGERS IV SOLN
INTRAVENOUS | Status: DC
  Administered 2014-08-23 (×2): via INTRAVENOUS

## 2014-08-23 MED ORDER — HEPARIN SODIUM (PORCINE) 1000 UNIT/ML IJ SOLN
INTRAMUSCULAR | Status: AC
  Filled 2014-08-23: qty 1

## 2014-08-23 MED ORDER — ROCURONIUM BROMIDE 50 MG/5ML IV SOLN
INTRAVENOUS | Status: AC
  Filled 2014-08-23: qty 1

## 2014-08-23 MED ORDER — DOCUSATE SODIUM 100 MG PO CAPS
100.0000 mg | ORAL_CAPSULE | Freq: Every day | ORAL | Status: DC
Start: 2014-08-24 — End: 2014-08-26
  Administered 2014-08-24 – 2014-08-25 (×2): 100 mg via ORAL
  Filled 2014-08-23 (×3): qty 1

## 2014-08-23 MED ORDER — IODIXANOL 320 MG/ML IV SOLN
INTRAVENOUS | Status: DC | PRN
  Administered 2014-08-23: 140 mL via INTRA_ARTERIAL

## 2014-08-23 MED ORDER — FENTANYL CITRATE 0.05 MG/ML IJ SOLN: 100.0000 ug | Freq: Once | INTRAMUSCULAR | Status: DC

## 2014-08-23 MED ORDER — ENOXAPARIN SODIUM 30 MG/0.3ML ~~LOC~~ SOLN
30.0000 mg | SUBCUTANEOUS | Status: DC
  Administered 2014-08-24 – 2014-08-26 (×3): 30 mg via SUBCUTANEOUS
  Filled 2014-08-23 (×4): qty 0.3

## 2014-08-23 MED ORDER — FENTANYL CITRATE 0.05 MG/ML IJ SOLN
INTRAMUSCULAR | Status: AC
  Filled 2014-08-23: qty 5

## 2014-08-23 MED ORDER — FUROSEMIDE 10 MG/ML IJ SOLN
40.0000 mg | Freq: Once | INTRAMUSCULAR | Status: AC
  Administered 2014-08-23: 40 mg via INTRAVENOUS
  Filled 2014-08-23: qty 4

## 2014-08-23 MED ORDER — PROTAMINE SULFATE 10 MG/ML IV SOLN
INTRAVENOUS | Status: AC
  Filled 2014-08-23: qty 5

## 2014-08-23 MED ORDER — KCL IN DEXTROSE-NACL 20-5-0.45 MEQ/L-%-% IV SOLN
INTRAVENOUS | Status: DC
  Administered 2014-08-23: 50 mL/h via INTRAVENOUS
  Administered 2014-08-24: 10:00:00 via INTRAVENOUS
  Administered 2014-08-25: 50 mL/h via INTRAVENOUS
  Filled 2014-08-23 (×4): qty 1000

## 2014-08-23 MED ORDER — HYDROMORPHONE HCL 1 MG/ML IJ SOLN
0.2500 mg | INTRAMUSCULAR | Status: DC | PRN
  Administered 2014-08-23: 0.5 mg via INTRAVENOUS

## 2014-08-23 MED ORDER — SODIUM CHLORIDE 0.9 % IJ SOLN
INTRAMUSCULAR | Status: AC
  Filled 2014-08-23: qty 10

## 2014-08-23 MED ORDER — NEOSTIGMINE METHYLSULFATE 10 MG/10ML IV SOLN
INTRAVENOUS | Status: AC
  Filled 2014-08-23: qty 1

## 2014-08-23 MED ORDER — 0.9 % SODIUM CHLORIDE (POUR BTL) OPTIME
TOPICAL | Status: DC | PRN
  Administered 2014-08-23: 1000 mL

## 2014-08-23 MED ORDER — FENTANYL CITRATE 0.05 MG/ML IJ SOLN
INTRAMUSCULAR | Status: AC
  Administered 2014-08-23: 100 ug
  Filled 2014-08-23: qty 2

## 2014-08-23 MED ORDER — OXYCODONE HCL 5 MG/5ML PO SOLN: 5.0000 mg | Freq: Once | ORAL | Status: DC | PRN

## 2014-08-23 MED ORDER — PHENYLEPHRINE HCL 10 MG/ML IJ SOLN
10.0000 mg | INTRAVENOUS | Status: DC | PRN
  Administered 2014-08-23: 10 ug/min via INTRAVENOUS

## 2014-08-23 MED ORDER — HYDROMORPHONE HCL 1 MG/ML IJ SOLN
INTRAMUSCULAR | Status: AC
  Filled 2014-08-23: qty 1

## 2014-08-23 MED ORDER — HEPARIN SODIUM (PORCINE) 1000 UNIT/ML IJ SOLN
INTRAMUSCULAR | Status: DC | PRN
  Administered 2014-08-23: 7000 [IU] via INTRAVENOUS

## 2014-08-23 MED ORDER — LABETALOL HCL 5 MG/ML IV SOLN
10.0000 mg | INTRAVENOUS | Status: DC | PRN
Start: 2014-08-23 — End: 2014-08-25

## 2014-08-23 MED ORDER — LIDOCAINE HCL (CARDIAC) 20 MG/ML IV SOLN
INTRAVENOUS | Status: AC
  Filled 2014-08-23: qty 5

## 2014-08-23 MED ORDER — LIDOCAINE HCL (CARDIAC) 20 MG/ML IV SOLN
INTRAVENOUS | Status: DC | PRN
  Administered 2014-08-23: 80 mg via INTRAVENOUS

## 2014-08-23 MED ORDER — SODIUM CHLORIDE 0.9 % IR SOLN
Status: DC | PRN
  Administered 2014-08-23: 15:00:00

## 2014-08-23 MED ORDER — HYDRALAZINE HCL 20 MG/ML IJ SOLN
10.0000 mg | INTRAMUSCULAR | Status: DC | PRN
Start: 2014-08-23 — End: 2014-08-25
  Administered 2014-08-23: 10 mg via INTRAVENOUS
  Filled 2014-08-23 (×2): qty 1

## 2014-08-23 SURGICAL SUPPLY — 68 items
ADH SKN CLS APL DERMABOND .7 (GAUZE/BANDAGES/DRESSINGS) ×1
BAG BANDED W/RUBBER/TAPE 36X54 (MISCELLANEOUS) ×2 IMPLANT
BAG EQP BAND 135X91 W/RBR TAPE (MISCELLANEOUS) ×1
BAG SNAP BAND KOVER 36X36 (MISCELLANEOUS) ×2 IMPLANT
CANISTER SUCTION 2500CC (MISCELLANEOUS) ×2 IMPLANT
CATH ACCU-VU SIZ PIG 5F 100CM (CATHETERS) ×1 IMPLANT
CATH BALLN TRILOBE 26-42 (BALLOONS) ×1 IMPLANT
CATH BEACON 5.038 65CM KMP-01 (CATHETERS) IMPLANT
CLIP TI MEDIUM 24 (CLIP) IMPLANT
CLIP TI WIDE RED SMALL 24 (CLIP) IMPLANT
COVER DOME SNAP 22 D (MISCELLANEOUS) ×2 IMPLANT
COVER MAYO STAND STRL (DRAPES) ×2 IMPLANT
COVER PROBE W GEL 5X96 (DRAPES) ×2 IMPLANT
COVER SURGICAL LIGHT HANDLE (MISCELLANEOUS) ×2 IMPLANT
DERMABOND ADVANCED (GAUZE/BANDAGES/DRESSINGS) ×1
DERMABOND ADVANCED .7 DNX12 (GAUZE/BANDAGES/DRESSINGS) ×1 IMPLANT
DEVICE CLOSURE PERCLS PRGLD 6F (VASCULAR PRODUCTS) IMPLANT
DRAPE TABLE COVER HEAVY DUTY (DRAPES) ×2 IMPLANT
DRSG TEGADERM 2-3/8X2-3/4 SM (GAUZE/BANDAGES/DRESSINGS) ×2 IMPLANT
DRSG TEGADERM 4X4.75 (GAUZE/BANDAGES/DRESSINGS) ×1 IMPLANT
DRYSEAL FLEXSHEATH 22FR 33CM (SHEATH) ×1
ELECT CAUTERY BLADE 6.4 (BLADE) ×2 IMPLANT
ELECT REM PT RETURN 9FT ADLT (ELECTROSURGICAL) ×4
ELECTRODE REM PT RTRN 9FT ADLT (ELECTROSURGICAL) ×2 IMPLANT
ENDOPROSTHESIS THORAC 34X34X20 (Endovascular Graft) IMPLANT
ENDOPROTH THORACIC 34X34X20 (Endovascular Graft) ×2 IMPLANT
GAUZE SPONGE 2X2 8PLY STRL LF (GAUZE/BANDAGES/DRESSINGS) IMPLANT
GLOVE BIO SURGEON STRL SZ7.5 (GLOVE) ×3 IMPLANT
GLOVE BIOGEL PI IND STRL 7.0 (GLOVE) IMPLANT
GLOVE BIOGEL PI IND STRL 8 (GLOVE) IMPLANT
GLOVE BIOGEL PI INDICATOR 7.0 (GLOVE) ×2
GLOVE BIOGEL PI INDICATOR 8 (GLOVE) ×2
GOWN STRL REUS W/ TWL LRG LVL3 (GOWN DISPOSABLE) ×3 IMPLANT
GOWN STRL REUS W/TWL LRG LVL3 (GOWN DISPOSABLE) ×6
KIT BASIN OR (CUSTOM PROCEDURE TRAY) ×2 IMPLANT
KIT ROOM TURNOVER OR (KITS) ×2 IMPLANT
NDL PERC 18GX7CM (NEEDLE) ×1 IMPLANT
NEEDLE PERC 18GX7CM (NEEDLE) ×2 IMPLANT
NS IRRIG 1000ML POUR BTL (IV SOLUTION) ×4 IMPLANT
PACK AORTA (CUSTOM PROCEDURE TRAY) ×2 IMPLANT
PAD ARMBOARD 7.5X6 YLW CONV (MISCELLANEOUS) ×4 IMPLANT
PENCIL BUTTON HOLSTER BLD 10FT (ELECTRODE) ×1 IMPLANT
PERCLOSE PROGLIDE 6F (VASCULAR PRODUCTS) ×6
PROTECTION STATION PRESSURIZED (MISCELLANEOUS) ×2
SHEATH AVANTI 11CM 8FR (MISCELLANEOUS) ×1 IMPLANT
SHEATH DRYSEAL FLEX 22FR 33CM (SHEATH) IMPLANT
SPONGE GAUZE 2X2 STER 10/PKG (GAUZE/BANDAGES/DRESSINGS) ×1
SPONGE SURGIFOAM ABS GEL 100 (HEMOSTASIS) ×1 IMPLANT
STAPLER VISISTAT 35W (STAPLE) IMPLANT
STATION PROTECTION PRESSURIZED (MISCELLANEOUS) IMPLANT
STOPCOCK MORSE 400PSI 3WAY (MISCELLANEOUS) ×2 IMPLANT
SUT PROLENE 5 0 C 1 24 (SUTURE) IMPLANT
SUT VIC AB 2-0 CT1 27 (SUTURE)
SUT VIC AB 2-0 CT1 TAPERPNT 27 (SUTURE) IMPLANT
SUT VIC AB 3-0 SH 27 (SUTURE)
SUT VIC AB 3-0 SH 27X BRD (SUTURE) IMPLANT
SUT VICRYL 4-0 PS2 18IN ABS (SUTURE) ×3 IMPLANT
SYR 20CC LL (SYRINGE) ×4 IMPLANT
SYR 30ML LL (SYRINGE) ×1 IMPLANT
SYR 5ML LL (SYRINGE) ×2 IMPLANT
SYR MEDRAD MARK V 150ML (SYRINGE) ×2 IMPLANT
SYRINGE 10CC LL (SYRINGE) ×6 IMPLANT
TOWEL OR 17X24 6PK STRL BLUE (TOWEL DISPOSABLE) ×4 IMPLANT
TOWEL OR 17X26 10 PK STRL BLUE (TOWEL DISPOSABLE) ×4 IMPLANT
TRAY FOLEY CATH 16FRSI W/METER (SET/KITS/TRAYS/PACK) ×1 IMPLANT
TUBING HIGH PRESSURE 120CM (CONNECTOR) ×2 IMPLANT
WIRE BENTSON .035X145CM (WIRE) ×1 IMPLANT
WIRE STIFF LUNDERQUIST 260CM (WIRE) ×1 IMPLANT

## 2014-08-23 NOTE — Anesthesia Postprocedure Evaluation (Signed)
  Anesthesia Post-op Note  Patient: Charlotte [name]  Procedure(s) Performed: Procedure(s): ENDOVASCULAR THORACIC AORTIC  STENT GRAFT (N/A)  Patient Location: PACU  Anesthesia Type:General  Level of Consciousness: awake  Airway and Oxygen Therapy: Patient Spontanous Breathing  Post-op Pain: mild  Post-op Assessment: Post-op Vital signs reviewed  Post-op Vital Signs: Reviewed  Last Vitals:  Filed Vitals:   08/23/14 1645  BP: 156/79  Pulse:   Temp: 36.6 C  Resp:     Complications: No apparent anesthesia complications

## 2014-08-23 NOTE — Progress Notes (Signed)
[facility]  Patient Status: Active (receiving services up to time of hospitalization)  [facility] is providing the following services: RN, PT and MSW   ( Patient was seen 09/04,09/11,09/14,09/23,09/25,09/29)  If patient discharges after hours, please call [phone].   Samul DadaMiranda Chris 08/23/2014, 10:50 AM

## 2014-08-23 NOTE — Progress Notes (Signed)
Patient ID: Charlotte [name], female   DOB: 16-Nov-1966, 48 y.o.   MRN: [id number] TCTS DAILY ICU PROGRESS NOTE                   [address]            [place] [address]          [phone]        Total Length of Stay:  LOS: 1 day   Subjective: comfortable in icu  Objective: Vital signs in last 24 hours: Temp:  [97.7 F (36.5 C)-98 F (36.7 C)] 98 F (36.7 C) (10/02 0719) Pulse Rate:  [51-76] 65 (10/02 0600) Cardiac Rhythm:  [-] Normal sinus rhythm (10/02 0400) Resp:  [11-26] 16 (10/02 0600) BP: (93-155)/(54-107) 125/77 mmHg (10/02 0600) SpO2:  [96 %-100 %] 97 % (10/02 0600) Weight:  [140 lb (63.504 kg)] 140 lb (63.504 kg) (10/01 1321)  Filed Weights   08/22/14 1321  Weight: 140 lb (63.504 kg)    Weight change:    Hemodynamic parameters for last 24 hours:    Intake/Output from previous day: 10/01 0701 - 10/02 0700 In: 956.3 [I.V.:706.3; IV Piggyback:250] Out: 480 [Urine:480]  Intake/Output this shift:    Current Meds: Scheduled Meds: . [START ON 08/24/2014] atorvastatin  20 mg Oral q1800  . [START ON 08/24/2014] gabapentin  200 mg Oral TID  . hydrALAZINE  100 mg Oral BID  . labetalol  300 mg Oral BID  . pantoprazole  40 mg Oral Daily  . sertraline  100 mg Oral Daily  . sodium chloride  3 mL Intravenous Q12H   Continuous Infusions: . dextrose 5 % and 0.9% NaCl 125 mL/hr at 08/23/14 0700   PRN Meds:.sodium chloride, acetaminophen, acetaminophen, ALPRAZolam, docusate sodium, HYDROcodone-acetaminophen, ondansetron (ZOFRAN) IV, ondansetron, sodium chloride, traMADol  General appearance: alert, cooperative and no distress Neurologic: paraplegic Heart: regular rate and rhythm, S1, S2 normal, no murmur, click, rub or gallop Lungs: clear to auscultation bilaterally Abdomen: soft, non-tender; bowel sounds normal; no masses,  no organomegaly Extremities:palpable pulses femoral  Lab Results: CBC: Recent Labs  08/22/14 1332 08/23/14 0243    WBC 4.9 4.3  HGB 11.6* 9.6*  HCT 35.1* 29.6*  PLT 294 260   BMET:  Recent Labs  08/22/14 1332 08/23/14 0243  NA 142 143  K 4.1 3.2*  CL 104 106  CO2 22 25  GLUCOSE 103* 103*  BUN 14 15  CREATININE 1.15* 1.29*  CALCIUM 9.8 8.8    PT/INR:  Recent Labs  08/22/14 2100  LABPROT 14.1  INR 1.09   Radiology: Dg Chest Port 1 View  08/22/2014   CLINICAL DATA:  Chest pain and cardiac palpitations; history of thoracic aortic intramural hematoma  EXAM: PORTABLE CHEST - 1 VIEW  COMPARISON:  August 04, 2014 chest radiograph and chest CT August 01, 2014  FINDINGS: Prominence of the descending thoracic aorta remain stable. There is no edema or consolidation. Heart size and pulmonary vascular normal. No adenopathy. No pneumothorax. No bone lesions.  IMPRESSION: Persistent prominence of the descending thoracic aorta. No change in cardiac silhouette. No edema or consolidation.   Electronically Signed   By: Bretta Bang M.D.   On: 08/22/2014 13:50   Ct Angio Chest Aorta W/cm &/or Wo/cm  08/22/2014   CLINICAL DATA:  Chest pain  EXAM: CT ANGIOGRAPHY CHEST, ABDOMEN AND PELVIS  TECHNIQUE: Multidetector CT imaging through the chest, abdomen and pelvis was performed using the standard protocol during bolus  administration of intravenous contrast. Multiplanar reconstructed images and MIPs were obtained and reviewed to evaluate the vascular anatomy.  CONTRAST:  100mL OMNIPAQUE IOHEXOL 350 MG/ML SOLN  COMPARISON:  08/01/2014  FINDINGS: CTA CHEST FINDINGS  The descending aortic dissection is again noted. It has significantly changed in morphology since the prior study. Within the proximal descending aorta, there is no a large fenestration with contrast opacifying the false lumen posteriorly. The AP diameter of the aorta has increased to 3.7 cm. The area of all opacification in the false lumen measures 4.7 cm in vertical length. Superior to the pooling of contrast, the false lumen is thrombosed. There is  also a focal area of contrast opacification in the false lumen on image 102 of series 501 which is not significantly changed.  Great vessels are patent within the confines of the exam. There is no evidence of extension of the dissection into the left subclavian artery. Bilateral vertebral arteries are patent within the confines of the exam. Bilateral common carotid arteries are patent.  There is no evidence of ascending aortic dissection.  Small bilateral effusions.  Dependent atelectasis bilaterally.  Stable nodule in the left breast on image 73 of series 501.  Review of the MIP images confirms the above findings.  CTA ABDOMEN AND PELVIS FINDINGS  Within the abdomen, there are 2 areas of contrast opacification within the false lumen posteriorly on images 157 and 169. These areas may represent back filling from the lumbar arteries. They are not significantly changed. Beyond these areas of opacification in the false lumen, the dissection has largely regressed with resolution of the intramural hematoma.  Stable mild narrowing at the origin of the celiac axis. Branch vessels are patent. Accessory left hepatic artery  SMA is patent.  Branch vessels are patent.  Mild disease at the origin of the IMA.  Branch vessels are patent.  Single renal arteries are patent.  Bilateral common, internal, and external iliac arteries are patent with mild atherosclerotic changes.  Innumerable cysts in the liver and kidneys are stable. Stable calculus in the lower pole of the left kidney.  Gallbladder, spleen, pancreas, adrenal glands are within normal limits.  Uterine fibroids.  Bladder is within normal limits.  No free-fluid.  No obvious abnormal retroperitoneal adenopathy.  Review of the MIP images confirms the above findings.  IMPRESSION: Compared to the prior study, the morphology of the dissection in the proximal descending thoracic aorta has changed. It is now fenestrated with a large opening with contrast opacification of a 4.7 cm  vertical segment of the proximal thoracic aorta. The aorta has also expanded in the AP diameter, now within AP diameter of 3.7 cm. This is likely the source of the patient's new onset chest pain. Urgent correction of hypertension is recommended.  There is a focal area of contrast opacification in the distal thoracic aorta false lumen and areas within the abdomen that are not significantly changed.  Abdominal organs are stable.  Bibasilar atelectasis and small pleural effusions.   Electronically Signed   By: Maryclare BeanArt  Hoss M.D.   On: 08/22/2014 15:45     Assessment/Plan: Discussed with Dr Darrick PennaFields who has reviewed films and is in agreement to proceed with thoracic aortic stent graft  The goals risks and alternatives of the planned surgical procedure thoracic aortic stent graft  have been discussed with the patient in detail. The risks of the procedure including death, infection, stroke, myocardial infarction, bleeding, blood transfusion, vascular injury have all been discussed specifically.  I  have quoted Charlotte [name] a 5 % of perioperative mortality and a complication rate as high as 25 %. The patient's questions have been answered.Charlotte [name] is willing  to proceed with the planned procedure.     Heike Pounds B 08/23/2014 7:29 AM

## 2014-08-23 NOTE — Progress Notes (Signed)
Pt had approx 30 second period of stridor sounding respirations with normal oxygen saturation. Elevated HOB , stimulated pt to cough and take deep breath with resolution of abnormal sounding respirations. Lung sounds remained unchanged, sounding coarse bilaterally.

## 2014-08-23 NOTE — Transfer of Care (Signed)
Immediate Anesthesia Transfer of Care Note  Patient: Charlotte [name]  Procedure(s) Performed: Procedure(s): ENDOVASCULAR THORACIC AORTIC  STENT GRAFT (N/A)  Patient Location: PACU  Anesthesia Type:General  Level of Consciousness: sedated and responds to stimulation  Airway & Oxygen Therapy: Patient Spontanous Breathing and Patient connected to face mask oxygen  Post-op Assessment: Report given to PACU RN and Post -op Vital signs reviewed and stable  Post vital signs: Reviewed and stable  Complications: No apparent anesthesia complications

## 2014-08-23 NOTE — Op Note (Signed)
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  Co Surgeon: Ofilia Neas, MD Operative findings: #1 22 French dry seal sheath right femoral artery                                 #2 Proglide closure right femoral artery                                 #3 34 x 20 main body device extending from just distal to left subclavian artery  to mid descending thoracic aorta    Operative details: After obtaining informed consent, the patient was taken to operating room 16. The patient was placed in supine position the Angio table. Central line and arterial line was placed by the anesthesia team. After induction of anesthesia and endotracheal intubation, the patient was prepped and draped in the usual sterile fashion from the chin to the knees. Ultrasound was used to identify the right common femoral artery. A small nick was made in the skin with an 11 blade. The tract was then dilated with a hemostat. An introducer needle was used to cannulate the right common femoral artery. An 33 Bentson wire was threaded up into the descending thoracic aorta under fluoroscopic guidance. A 9 French sheath was used to dilate the tract. A Proglide device was then brought up in the operative field and advanced over the guidewire. The guidewire was then removed and the device was advanced to the root. Back bleeding. The first device was then deployed at the 10:00 orientation. The device was pulled back and the guidewire readvanced up into the abdominal aorta. An additional Proglide device was then deployed over the wire at the 2:00 position. The Proglide delivery device was removed and the 9 French sheath placed over the wire into the right femoral system. The Bentson was then advanced up into the ascending aorta. A 5 French pigtail catheter was advanced over this. Contrast  aortogram was then obtained and a 52 LAO projection. This showed patency of the innominate artery left common carotid and left subclavian artery. The ulcer was noted in the proximal third of the descending thoracic aorta.  The pigtail catheter was then rewired with an 035 double curved Lunderquist wire. This was advanced up to the aortic valve. The pigtail catheter was removed over the guidewire. The 9 French sheath was removed over the guidewire and replaced with a 22 French dry seal sheath. This advanced easily up into the abdominal aorta. The patient was given 7,000 units of intravenous heparin The pigtail catheter was then advanced once again using a buddy wire technique over the Bentson wire up into the ascending aorta. With the pigtail catheter left in place an 035 double curved Lunderquist wire was advanced so that it was sitting on the aortic valve. The 34 x 20 main body cTAG device was then advanced over the Lunderquist wire to the mid descending thoracic aorta. A repeat contrast angiogram was obtained and the level of the left subclavian and left common carotid arteries were marked. The gold band of the main body device was then advanced all the way up to the distal edge of the left subclavian artery. Device was then deployed in this position. This was done holding the wire in the  outer curve of the aorta. The delivery system was removed. At this point the Omni flush catheter was rewired and pulled down. It was advanced up through the endograft. Repeat contrast angiogram was performed which showed complete exclusion of the ulcer but there was still some late filling of the sac.  A tri lobe balloon was placed back over the Bentsen wire and the segment of graft at the ulcer gently ballooned for 2 inflations.  Repeat angiogram showed good wall apposition on the greater curve. There was no proximal endoleak. The pigtail catheter was then pulled down to the mid descending thoracic aorta and a lateral view of  the descending thoracic and upper abdominal aorta was performed to determine the precise location of the celiac origin.  A completion arteriogram was performed of the distal end which confirmed that the celiac artery was widely patent and the graft was about 6 cm proximal. At this point the Lunderquist wire was removed. The sheath was then removed over the guidewire and holding pressure above and below the entry site the Proglide were secured down. There was still some oozing so one additional proglide was fired in an AP direction.  There was good hemostasis at this point. The guidewire was removed. Direct pressure was held for approximately 10 minutes while 50 mg of protamine was administered. The needle hole was then sutured closed with a single 4-0 Vicryl stitch. The patient was confirmed to have dorsalis pedis pulse at the end of the case.    Fabienne Brunsharles Fields, MD  [facility]  Office: [phone]  Pager: [phone]

## 2014-08-23 NOTE — Progress Notes (Signed)
Patient ID: [name], female   DOB: [birth year], 48 y.o.   MRN: [id number] EVENING ROUNDS NOTE :     [address]       [place] [id number]             [phone]                 Day of Surgery Procedure(s) (LRB): ENDOVASCULAR THORACIC AORTIC  STENT GRAFT (N/A)  Total Length of Stay:  LOS: 1 day  BP 151/71  Pulse 47  Temp(Src) 97.9 F (36.6 C) (Oral)  Resp 11  Ht 5\' 6"  (1.676 m)  Wt 140 lb (63.504 kg)  BMI 22.61 kg/m2  SpO2 100%  .Intake/Output     10/01 0701 - 10/02 0700 10/02 0701 - 10/03 0700   I.V. (mL/kg) 706.3 (11.1) 1825 (28.7)   IV Piggyback 250    Total Intake(mL/kg) 956.3 (15.1) 1825 (28.7)   Urine (mL/kg/hr) 480 610 (0.9)   Blood  100 (0.1)   Total Output 480 710   Net +476.3 +1115          . dextrose 5 % and 0.45 % NaCl with KCl 20 mEq/L    . dextrose 5 % and 0.9% NaCl 125 mL/hr at 08/23/14 1200  . lactated ringers 50 mL/hr at 08/23/14 1315  . lactated ringers       Lab Results  Component Value Date   WBC 4.9 08/23/2014   HGB 9.6* 08/23/2014   HCT 29.9* 08/23/2014   PLT 242 08/23/2014   GLUCOSE 108* 08/23/2014   CHOL 201* 06/30/2014   TRIG 96 06/30/2014   HDL 53 06/30/2014   LDLCALC 213129* 06/30/2014   ALT 34 08/04/2014   AST 33 08/04/2014   NA 142 08/23/2014   K 3.6* 08/23/2014   CL 107 08/23/2014   CREATININE 1.10 08/23/2014   BUN 12 08/23/2014   CO2 24 08/23/2014   TSH 1.500 07/30/2014   INR 1.18 08/23/2014   HGBA1C 5.7* 06/30/2014   Back in unit post op Stridor in recovery room,  Awake and alert   Delight OvensEdward B Calani Gick MD  Beeper [phone] Office [phone] 08/23/2014 6:09 PM

## 2014-08-23 NOTE — Progress Notes (Signed)
Notified Dr. Donata ClayVan Trigt of decreased UOP (total 50cc over past 3 hours) and current BP 90s-50s maps lower 60s.  Orders received.  Will continue to monitor.  Roselie AwkwardShannon Jem Castro, RN

## 2014-08-23 NOTE — Anesthesia Preprocedure Evaluation (Signed)
Anesthesia Evaluation  Patient identified by MRN, date of birth, ID band Patient awake    Reviewed: Allergy & Precautions, H&P , NPO status , Patient's Chart, lab work & pertinent test results, reviewed documented beta blocker date and time   Airway Mallampati: I TM Distance: >3 FB Neck ROM: Full    Dental  (+) Teeth Intact, Dental Advisory Given   Pulmonary former smoker,  breath sounds clear to auscultation        Cardiovascular hypertension, Pt. on medications and Pt. on home beta blockers + Peripheral Vascular Disease Rhythm:Regular Rate:Normal     Neuro/Psych    GI/Hepatic GERD-  Medicated and Controlled,  Endo/Other    Renal/GU      Musculoskeletal   Abdominal   Peds  Hematology   Anesthesia Other Findings   Reproductive/Obstetrics                           Anesthesia Physical Anesthesia Plan  ASA: IV  Anesthesia Plan: General   Post-op Pain Management:    Induction: Intravenous  Airway Management Planned: Oral ETT  Additional Equipment: Arterial line, CVP and Ultrasound Guidance Line Placement  Intra-op Plan:   Post-operative Plan: Extubation in OR  Informed Consent: I have reviewed the patients History and Physical, chart, labs and discussed the procedure including the risks, benefits and alternatives for the proposed anesthesia with the patient or authorized representative who has indicated his/her understanding and acceptance.   Dental advisory given  Plan Discussed with: Anesthesiologist, Surgeon and CRNA  Anesthesia Plan Comments:         Anesthesia Quick Evaluation

## 2014-08-23 NOTE — Progress Notes (Signed)
Pt had brief episode of stridor type respirations with desaturation into 70's after transferring from stretcher to bed. Episode resolved within a minute upon raising HOB,  with oxygen saturation returning to 100% and no further stridor-sounding respirations.

## 2014-08-23 NOTE — Care Management Note (Addendum)
    Page 1 of 2   08/26/2014     3:58:47 PM CARE MANAGEMENT NOTE 08/26/2014  Patient:  Charlotte [name],Charlotte [name]   Account Number:  [id number]  Date Initiated:  08/23/2014  Documentation initiated by:  MAYO,HENRIETTA  Subjective/Objective Assessment:   dx thoracic aortic dissection; lives with dtr; active with [facility] for RN, PT, and SW    PCP Kevan Ny     Action/Plan:   Pt lives at home with 2 daughters.   Anticipated DC Date:  08/26/2014   Anticipated DC Plan:  [place]  In-house referral  [place]  CM consult      [facility] Choice  Resumption Of Svcs/PTA Provider   Choice offered to / List presented to:  C-1 Patient        [place] arranged  HH-1 RN  [place].   Status of service:  Completed, signed off Medicare Important Message given?   (If response is "NO", the following Medicare IM given date fields will be blank) Date Medicare IM given:   Medicare IM given by:   Date Additional Medicare IM given:   Additional Medicare IM given by:    Discharge Disposition:  [place]  Per UR Regulation:  Reviewed for med. necessity/level of care/duration of stay  If discussed at [place] of Stay Meetings, dates discussed:    Comments:  08/26/14 Ellan Lambert, RN, BSN [phone] Pt for dc home today.  Met with pt and daughter to discuss dc arrangements.  Resumption orders for [facility] received; plan to continue [facility] per [facility] as prior to admission.  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  Checked with Development worker, community, and she states Medicaid app is pending.Marland KitchenMarland Kitchenpt will have to cont to wait for Medicaid to come through.  Pt states [place] card has expired; she needs to have card reactivated, and has appt at the "old [facility]" clinic to do this.  Pt gets meds through [employer] program.  Pt not eligible for  any assistance through [facility], as has used this calendar year.  Pt has hospital bed, BSC, RW, WC, and shower chair at home.  08/23/14 [place] MSN BSN CCM Received referral stating pt had been set up with [facility] but no one from the agency had visited.  TC to liaison reveals home visits documented on 9/4, 9/11, 9/14, 9/23, 9/25, and 9/29.

## 2014-08-23 NOTE — Progress Notes (Signed)
Utilization review completed. Annabeth Tortora, RN, BSN. 

## 2014-08-23 NOTE — Brief Op Note (Signed)
      [address]       [place] [id number]             [phone]       08/23/2014  3:59 PM  PATIENT:  Rowe RobertHelene J Kopko  48 y.o. 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   Delay start of Pharmacological VTE agent (>24hrs) due to surgical blood loss or risk of bleeding: yes

## 2014-08-23 NOTE — Progress Notes (Signed)
Dr Sampson GoonFitzgerald updated  re events here upon tx to SICU.  Pt is awake, SB 50's, cuff BP 140's/73, art line higher (as it has been in PACU) running 180/'s/70's. O2 sat 97-100% on 2 L [place]. Has a few rhonchi, no stridor at pres. Dr Sampson GoonFitzgerald came to see pt. No new orders X put pt on humidified O2.

## 2014-08-23 NOTE — Anesthesia Procedure Notes (Addendum)
Procedure Name: Intubation Date/Time: 08/23/2014 2:24 PM Performed by: Jerilee HohMUMM, [place] Hudman N Pre-anesthesia Checklist: Patient identified, Emergency Drugs available, Suction available and Patient being monitored Patient Re-evaluated:Patient Re-evaluated prior to inductionOxygen Delivery Method: Circle system utilized Preoxygenation: Pre-oxygenation with 100% oxygen Intubation Type: IV induction Ventilation: Mask ventilation without difficulty and Oral airway inserted - appropriate to patient size Laryngoscope Size: Mac and 3 Grade View: Grade II Tube type: Oral Tube size: 7.5 mm Number of attempts: 1 Airway Equipment and Method: Stylet Placement Confirmation: ETT inserted through vocal cords under direct vision,  positive ETCO2 and breath sounds checked- equal and bilateral Secured at: 22 cm Tube secured with: Tape Dental Injury: Teeth and Oropharynx as per pre-operative assessment       Image of needle in R IJ vein for CVC placement.

## 2014-08-24 LAB — URINE CULTURE
CULTURE: NO GROWTH
Colony Count: NO GROWTH

## 2014-08-24 LAB — BASIC METABOLIC PANEL
Anion gap: 12 (ref 5–15)
BUN: 13 mg/dL (ref 6–23)
CO2: 26 mEq/L (ref 19–32)
Calcium: 8.8 mg/dL (ref 8.4–10.5)
Chloride: 104 mEq/L (ref 96–112)
Creatinine, Ser: 1.3 mg/dL — ABNORMAL HIGH (ref 0.50–1.10)
GFR calc Af Amer: 54 mL/min — ABNORMAL LOW (ref >90)
GFR calc non Af Amer: 47 mL/min — ABNORMAL LOW (ref >90)
Glucose, Bld: 120 mg/dL — ABNORMAL HIGH (ref 70–99)
Potassium: 3.2 mEq/L — ABNORMAL LOW (ref 3.7–5.3)
Sodium: 142 mEq/L (ref 137–147)

## 2014-08-24 LAB — CBC
HCT: 30.8 % — ABNORMAL LOW (ref 36.0–46.0)
Hemoglobin: 9.8 g/dL — ABNORMAL LOW (ref 12.0–15.0)
MCH: 26.6 pg (ref 26.0–34.0)
MCHC: 31.8 g/dL (ref 30.0–36.0)
MCV: 83.5 fL (ref 78.0–100.0)
Platelets: 253 10*3/uL (ref 150–400)
RBC: 3.69 MIL/uL — ABNORMAL LOW (ref 3.87–5.11)
RDW: 14.3 % (ref 11.5–15.5)
WBC: 6.6 10*3/uL (ref 4.0–10.5)

## 2014-08-24 MED ORDER — ENOXAPARIN SODIUM 30 MG/0.3ML ~~LOC~~ SOLN: 30.0000 mg | SUBCUTANEOUS | Status: DC

## 2014-08-24 MED ORDER — POTASSIUM CHLORIDE 10 MEQ/50ML IV SOLN
10.0000 meq | INTRAVENOUS | Status: AC | PRN
  Administered 2014-08-24 (×3): 10 meq via INTRAVENOUS

## 2014-08-24 NOTE — Progress Notes (Signed)
Patient ID: [name], female   DOB: 1966-08-04, 48 y.o.   MRN: [id number] EVENING ROUNDS NOTE :     [address]       [place] [id number]             [phone]                 1 Day Post-Op Procedure(s) (LRB): ENDOVASCULAR THORACIC AORTIC  STENT GRAFT (N/A)  Total Length of Stay:  LOS: 2 days  BP 140/73  Pulse 75  Temp(Src) 97.6 F (36.4 C) (Oral)  Resp 18  Ht 5\' 6"  (1.676 m)  Wt 144 lb 6.4 oz (65.5 kg)  BMI 23.32 kg/m2  SpO2 100%  .Intake/Output     10/02 0701 - 10/03 0700 10/03 0701 - 10/04 0700   P.O. 240 480   I.V. (mL/kg) 2425 (37) 450 (6.9)   IV Piggyback 150 100   Total Intake(mL/kg) 2815 (43) 1030 (15.7)   Urine (mL/kg/hr) 2660 (1.7) 650 (0.8)   Blood 100 (0.1)    Total Output 2760 650   Net +55 +380          . dextrose 5 % and 0.45 % NaCl with KCl 20 mEq/L 50 mL/hr at 08/24/14 0959  . dextrose 5 % and 0.9% NaCl 125 mL/hr at 08/23/14 1200  . lactated ringers 50 mL/hr at 08/23/14 1315  . lactated ringers       Lab Results  Component Value Date   WBC 6.6 08/24/2014   HGB 9.8* 08/24/2014   HCT 30.8* 08/24/2014   PLT 253 08/24/2014   GLUCOSE 120* 08/24/2014   CHOL 201* 06/30/2014   TRIG 96 06/30/2014   HDL 53 06/30/2014   LDLCALC 213129* 06/30/2014   ALT 34 08/04/2014   AST 33 08/04/2014   NA 142 08/24/2014   K 3.2* 08/24/2014   CL 104 08/24/2014   CREATININE 1.30* 08/24/2014   BUN 13 08/24/2014   CO2 26 08/24/2014   TSH 1.500 07/30/2014   INR 1.18 08/23/2014   HGBA1C 5.7* 06/30/2014   Stable day, sat in chair today To floor in am  Delight OvensEdward B Mak Bonny MD  Beeper [phone] Office [phone] 08/24/2014 7:00 PM

## 2014-08-24 NOTE — Progress Notes (Signed)
[facility]  Subjective  - Some groin pain, still some chest pain   Objective 141/71 75 98.9 F (37.2 C) (Oral) 18 99%  Intake/Output Summary (Last 24 hours) at 08/24/14 16100807 Last data filed at 08/24/14 0600  Gross per 24 hour  Intake   2690 ml  Output   2710 ml  Net    -20 ml   Right groin puncture site no hematoma Moves toes but not legs 2+ DP pulses bilaterally  Assessment/Planning: POD #1 s/p TEVAR, doing well from my standpoint Dr Tyrone SageGerhardt evaluating chest pain Hypokalemia will treat  Charlotte [name] E 08/24/2014 8:07 AM --  Laboratory Lab Results:  Recent Labs  08/23/14 1630 08/24/14 0338  WBC 4.9 6.6  HGB 9.6* 9.8*  HCT 29.9* 30.8*  PLT 242 253   BMET  Recent Labs  08/23/14 1630 08/24/14 0338  NA 142 142  K 3.6* 3.2*  CL 107 104  CO2 24 26  GLUCOSE 108* 120*  BUN 12 13  CREATININE 1.10 1.30*  CALCIUM 8.8 8.8    COAG Lab Results  Component Value Date   INR 1.18 08/23/2014   INR 1.09 08/22/2014   INR 1.11 08/04/2014   No results found for this basename: PTT

## 2014-08-24 NOTE — Progress Notes (Signed)
Patient ID: Charlotte [name], female   DOB: [birth year], 48 y.o.   MRN: [id number] T[facility] DAILY ICU PROGRESS NOTE                   [address]            [place] [address]          [phone]   1 Day Post-Op Procedure(s) (LRB): ENDOVASCULAR THORACIC AORTIC  STENT GRAFT (N/A)  Total Length of Stay:  LOS: 2 days   Subjective: No complaints this am, says chest feels better  Objective: Vital signs in last 24 hours: Temp:  [97.5 F (36.4 C)-98.9 F (37.2 C)] 98.9 F (37.2 C) (10/03 0400) Pulse Rate:  [46-91] 82 (10/03 0800) Cardiac Rhythm:  [-] Normal sinus rhythm (10/03 0800) Resp:  [3-26] 18 (10/03 0800) BP: (105-159)/(52-109) 132/67 mmHg (10/03 0800) SpO2:  [85 %-100 %] 100 % (10/03 0800) Arterial Line BP: (145-188)/(60-87) 153/68 mmHg (10/03 0800) Weight:  [144 lb 6.4 oz (65.5 kg)] 144 lb 6.4 oz (65.5 kg) (10/03 0600)  Filed Weights   08/22/14 1321 08/24/14 0600  Weight: 140 lb (63.504 kg) 144 lb 6.4 oz (65.5 kg)    Weight change: 4 lb 6.4 oz (1.996 kg)   Hemodynamic parameters for last 24 hours:    Intake/Output from previous day: 10/02 0701 - 10/03 0700 In: 2815 [P.O.:240; I.V.:2425; IV Piggyback:150] Out: 2760 [Urine:2660; Blood:100]  Intake/Output this shift:    Current Meds: Scheduled Meds: . atorvastatin  20 mg Oral q1800  . docusate sodium  100 mg Oral Daily  . enoxaparin (LOVENOX) injection  30 mg Subcutaneous Q24H  . gabapentin  200 mg Oral TID  . hydrALAZINE  100 mg Oral BID  . labetalol  300 mg Oral BID  . pantoprazole  40 mg Oral Daily  . sertraline  100 mg Oral Daily  . sodium chloride  3 mL Intravenous Q12H   Continuous Infusions: . dextrose 5 % and 0.45 % NaCl with KCl 20 mEq/L 50 mL/hr (08/23/14 1832)  . dextrose 5 % and 0.9% NaCl 125 mL/hr at 08/23/14 1200  . lactated ringers 50 mL/hr at 08/23/14 1315  . lactated ringers     PRN Meds:.sodium chloride, acetaminophen, acetaminophen, ALPRAZolam, alum & mag hydroxide-simeth,  docusate sodium, guaiFENesin-dextromethorphan, hydrALAZINE, HYDROcodone-acetaminophen, labetalol, metoprolol, ondansetron, ondansetron, phenol, polyethylene glycol, potassium chloride, potassium chloride, sodium chloride, traMADol  General appearance: alert, cooperative, appears older than stated age and no distress Neurologic: lower extremities unchanged Heart: regular rate and rhythm, S1, S2 normal, no murmur, click, rub or gallop Lungs: clear to auscultation bilaterally Abdomen: soft, non-tender; bowel sounds normal; no masses,  no organomegaly Extremities: no swelling, palpable pulses pedal Wound: rt groin site with dressing no hematoma  Lab Results: CBC: Recent Labs  08/23/14 1630 08/24/14 0338  WBC 4.9 6.6  HGB 9.6* 9.8*  HCT 29.9* 30.8*  PLT 242 253   BMET:  Recent Labs  08/23/14 1630 08/24/14 0338  NA 142 142  K 3.6* 3.2*  CL 107 104  CO2 24 26  GLUCOSE 108* 120*  BUN 12 13  CREATININE 1.10 1.30*  CALCIUM 8.8 8.8    PT/INR:  Recent Labs  08/23/14 1630  LABPROT 15.0  INR 1.18   Radiology: Dg Chest Portable 1 View  08/23/2014   CLINICAL DATA:  Central line placement  EXAM: PORTABLE CHEST - 1 VIEW  COMPARISON:  CT 08/22/2014, chest radiograph 08/22/2014  FINDINGS: Right central venous line placement  with tip in the distal SVC. No pneumothorax. Endovascular stent graft noted which appears well expanded. Mild left basilar atelectasis.  IMPRESSION: Right central venous line in good position without pneumothorax.  Endovascular aortic stent graft appears well expanded.   Electronically Signed   By: Genevive BiStewart  Edmunds M.D.   On: 08/23/2014 16:33   Dg Chest Port 1 View  08/22/2014   CLINICAL DATA:  Chest pain and cardiac palpitations; history of thoracic aortic intramural hematoma  EXAM: PORTABLE CHEST - 1 VIEW  COMPARISON:  August 04, 2014 chest radiograph and chest CT August 01, 2014  FINDINGS: Prominence of the descending thoracic aorta remain stable. There is no  edema or consolidation. Heart size and pulmonary vascular normal. No adenopathy. No pneumothorax. No bone lesions.  IMPRESSION: Persistent prominence of the descending thoracic aorta. No change in cardiac silhouette. No edema or consolidation.   Electronically Signed   By: Bretta BangWilliam  Woodruff M.D.   On: 08/22/2014 13:50   Ct Angio Chest Aorta W/cm &/or Wo/cm  08/22/2014   CLINICAL DATA:  Chest pain  EXAM: CT ANGIOGRAPHY CHEST, ABDOMEN AND PELVIS  TECHNIQUE: Multidetector CT imaging through the chest, abdomen and pelvis was performed using the standard protocol during bolus administration of intravenous contrast. Multiplanar reconstructed images and MIPs were obtained and reviewed to evaluate the vascular anatomy.  CONTRAST:  100mL OMNIPAQUE IOHEXOL 350 MG/ML SOLN  COMPARISON:  08/01/2014  FINDINGS: CTA CHEST FINDINGS  The descending aortic dissection is again noted. It has significantly changed in morphology since the prior study. Within the proximal descending aorta, there is no a large fenestration with contrast opacifying the false lumen posteriorly. The AP diameter of the aorta has increased to 3.7 cm. The area of all opacification in the false lumen measures 4.7 cm in vertical length. Superior to the pooling of contrast, the false lumen is thrombosed. There is also a focal area of contrast opacification in the false lumen on image 102 of series 501 which is not significantly changed.  Great vessels are patent within the confines of the exam. There is no evidence of extension of the dissection into the left subclavian artery. Bilateral vertebral arteries are patent within the confines of the exam. Bilateral common carotid arteries are patent.  There is no evidence of ascending aortic dissection.  Small bilateral effusions.  Dependent atelectasis bilaterally.  Stable nodule in the left breast on image 73 of series 501.  Review of the MIP images confirms the above findings.  CTA ABDOMEN AND PELVIS FINDINGS   Within the abdomen, there are 2 areas of contrast opacification within the false lumen posteriorly on images 157 and 169. These areas may represent back filling from the lumbar arteries. They are not significantly changed. Beyond these areas of opacification in the false lumen, the dissection has largely regressed with resolution of the intramural hematoma.  Stable mild narrowing at the origin of the celiac axis. Branch vessels are patent. Accessory left hepatic artery  SMA is patent.  Branch vessels are patent.  Mild disease at the origin of the IMA.  Branch vessels are patent.  Single renal arteries are patent.  Bilateral common, internal, and external iliac arteries are patent with mild atherosclerotic changes.  Innumerable cysts in the liver and kidneys are stable. Stable calculus in the lower pole of the left kidney.  Gallbladder, spleen, pancreas, adrenal glands are within normal limits.  Uterine fibroids.  Bladder is within normal limits.  No free-fluid.  No obvious abnormal retroperitoneal adenopathy.  Review  of the MIP images confirms the above findings.  IMPRESSION: Compared to the prior study, the morphology of the dissection in the proximal descending thoracic aorta has changed. It is now fenestrated with a large opening with contrast opacification of a 4.7 cm vertical segment of the proximal thoracic aorta. The aorta has also expanded in the AP diameter, now within AP diameter of 3.7 cm. This is likely the source of the patient's new onset chest pain. Urgent correction of hypertension is recommended.  There is a focal area of contrast opacification in the distal thoracic aorta false lumen and areas within the abdomen that are not significantly changed.  Abdominal organs are stable.  Bibasilar atelectasis and small pleural effusions.   Electronically Signed   By: Maryclare Bean M.D.   On: 08/22/2014 15:45      Assessment/Plan: S/P Procedure(s) (LRB): ENDOVASCULAR THORACIC AORTIC  STENT GRAFT  (N/A) Mobilize Diuresis d/c tubes/lines repace k for hypokalemia Pt ot to see Control bp      Keymarion Bearman B 08/24/2014 8:31 AM

## 2014-08-25 LAB — URINE CULTURE
Colony Count: NO GROWTH
Culture: NO GROWTH

## 2014-08-25 LAB — CBC
HCT: 27.6 % — ABNORMAL LOW (ref 36.0–46.0)
Hemoglobin: 9 g/dL — ABNORMAL LOW (ref 12.0–15.0)
MCH: 26.9 pg (ref 26.0–34.0)
MCHC: 32.6 g/dL (ref 30.0–36.0)
MCV: 82.6 fL (ref 78.0–100.0)
Platelets: 219 10*3/uL (ref 150–400)
RBC: 3.34 MIL/uL — ABNORMAL LOW (ref 3.87–5.11)
RDW: 14.3 % (ref 11.5–15.5)
WBC: 7.5 10*3/uL (ref 4.0–10.5)

## 2014-08-25 LAB — BASIC METABOLIC PANEL
Anion gap: 11 (ref 5–15)
BUN: 17 mg/dL (ref 6–23)
CO2: 25 mEq/L (ref 19–32)
Calcium: 8.7 mg/dL (ref 8.4–10.5)
Chloride: 100 mEq/L (ref 96–112)
Creatinine, Ser: 1.44 mg/dL — ABNORMAL HIGH (ref 0.50–1.10)
GFR calc Af Amer: 48 mL/min — ABNORMAL LOW (ref >90)
GFR calc non Af Amer: 42 mL/min — ABNORMAL LOW (ref >90)
Glucose, Bld: 115 mg/dL — ABNORMAL HIGH (ref 70–99)
Potassium: 3.7 mEq/L (ref 3.7–5.3)
Sodium: 136 mEq/L — ABNORMAL LOW (ref 137–147)

## 2014-08-25 MED ORDER — ALPRAZOLAM 0.25 MG PO TABS
0.2500 mg | ORAL_TABLET | Freq: Three times a day (TID) | ORAL | Status: DC | PRN
  Administered 2014-08-25 (×2): 0.25 mg via ORAL
  Filled 2014-08-25 (×2): qty 1

## 2014-08-25 MED ORDER — SERTRALINE HCL 100 MG PO TABS
100.0000 mg | ORAL_TABLET | Freq: Every day | ORAL | Status: DC
Start: 2014-08-25 — End: 2014-08-26
  Administered 2014-08-25 – 2014-08-26 (×2): 100 mg via ORAL
  Filled 2014-08-25 (×2): qty 1

## 2014-08-25 MED ORDER — POTASSIUM CHLORIDE CRYS ER 20 MEQ PO TBCR
20.0000 meq | EXTENDED_RELEASE_TABLET | Freq: Two times a day (BID) | ORAL | Status: DC
  Administered 2014-08-25 – 2014-08-26 (×3): 20 meq via ORAL
  Filled 2014-08-25 (×4): qty 1

## 2014-08-25 MED ORDER — HYDRALAZINE HCL 50 MG PO TABS
100.0000 mg | ORAL_TABLET | Freq: Two times a day (BID) | ORAL | Status: DC
  Administered 2014-08-25 – 2014-08-26 (×3): 100 mg via ORAL
  Filled 2014-08-25 (×4): qty 2

## 2014-08-25 MED ORDER — ATORVASTATIN CALCIUM 20 MG PO TABS
20.0000 mg | ORAL_TABLET | Freq: Every day | ORAL | Status: DC
  Administered 2014-08-25: 20 mg via ORAL
  Filled 2014-08-25 (×2): qty 1

## 2014-08-25 MED ORDER — TRAMADOL HCL 50 MG PO TABS
50.0000 mg | ORAL_TABLET | Freq: Four times a day (QID) | ORAL | Status: DC | PRN
  Administered 2014-08-25 – 2014-08-26 (×3): 50 mg via ORAL
  Filled 2014-08-25 (×3): qty 1

## 2014-08-25 MED ORDER — FAMOTIDINE 20 MG PO TABS
20.0000 mg | ORAL_TABLET | Freq: Two times a day (BID) | ORAL | Status: DC
  Administered 2014-08-25 – 2014-08-26 (×3): 20 mg via ORAL
  Filled 2014-08-25 (×4): qty 1

## 2014-08-25 MED ORDER — POTASSIUM CHLORIDE 10 MEQ/50ML IV SOLN
10.0000 meq | INTRAVENOUS | Status: DC
  Administered 2014-08-25 (×3): 10 meq via INTRAVENOUS
  Filled 2014-08-25 (×2): qty 50

## 2014-08-25 MED ORDER — LABETALOL HCL 300 MG PO TABS
300.0000 mg | ORAL_TABLET | Freq: Two times a day (BID) | ORAL | Status: DC
  Administered 2014-08-25 – 2014-08-26 (×3): 300 mg via ORAL
  Filled 2014-08-25 (×4): qty 1

## 2014-08-25 MED ORDER — GABAPENTIN 100 MG PO CAPS
200.0000 mg | ORAL_CAPSULE | Freq: Three times a day (TID) | ORAL | Status: DC
  Administered 2014-08-25 – 2014-08-26 (×4): 200 mg via ORAL
  Filled 2014-08-25 (×6): qty 2

## 2014-08-25 MED ORDER — POLYETHYLENE GLYCOL 3350 17 G PO PACK
17.0000 g | PACK | Freq: Every day | ORAL | Status: DC
  Administered 2014-08-25: 17 g via ORAL
  Filled 2014-08-25 (×2): qty 1

## 2014-08-25 MED ORDER — BISACODYL 10 MG RE SUPP
10.0000 mg | Freq: Every day | RECTAL | Status: DC
  Administered 2014-08-25: 10 mg via RECTAL
  Filled 2014-08-25: qty 1

## 2014-08-25 MED ORDER — HYDROCHLOROTHIAZIDE 12.5 MG PO CAPS
12.5000 mg | ORAL_CAPSULE | Freq: Every day | ORAL | Status: DC
  Administered 2014-08-25 – 2014-08-26 (×2): 12.5 mg via ORAL
  Filled 2014-08-25 (×2): qty 1

## 2014-08-25 MED ORDER — ENSURE COMPLETE PO LIQD
237.0000 mL | Freq: Three times a day (TID) | ORAL | Status: DC
  Administered 2014-08-25 – 2014-08-26 (×4): 237 mL via ORAL

## 2014-08-25 MED ORDER — INFLUENZA VAC SPLIT QUAD 0.5 ML IM SUSY
0.5000 mL | PREFILLED_SYRINGE | INTRAMUSCULAR | Status: AC
  Administered 2014-08-26: 0.5 mL via INTRAMUSCULAR
  Filled 2014-08-25: qty 0.5

## 2014-08-25 MED ORDER — METHOCARBAMOL 500 MG PO TABS
500.0000 mg | ORAL_TABLET | Freq: Four times a day (QID) | ORAL | Status: DC | PRN
  Administered 2014-08-25 – 2014-08-26 (×2): 500 mg via ORAL
  Filled 2014-08-25 (×2): qty 1

## 2014-08-25 NOTE — Discharge Summary (Signed)
Physician Discharge Summary  Patient ID: TRINATY BUNDRICK MRN: [id number] DOB/AGE: 04-16-1966 48 y.o.  Admit date: 08/22/2014 Discharge date: 08/25/2014  Admission Diagnoses:Type B thoracic aortic dissection (change in the descending thoracic aortic dissection)  .  History of Present Illness:  This is a 48 year old African American woman, previously known to Dr. Tyrone Sage from previous admission for Type B thoracic aortic dissection. She was first admitted in August 2015 for this. 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 She was discharged from the hospital on 07/25/14. Approximately four days later, she went to [facility] ER with a chief complaint of chest pain again. She ruled out for myocardial infarction. She was discharged on 07/31/14. She returned to the emergency room again on 08/02/14 with chest pain and was evaluated and released. Since discharge from the hospital, she has not yet started home physical therapy. Apparently, her referrals are in process for home health nurse and physical therapist to help her with leg strength.  She has had some mild lightheaded episodes. She has not had syncope. She has a history of tobacco abuse but stopped smoking when she had her aortic dissection and her stroke last month. She has had some atypical left-sided chest discomfort under the left lateral breast. According to the daughter, associated with this was shortness of breath and diaphoresis. EKG apparently showed SR, probable LVH and LA enlargement. Initial Troponin <0.30.CT angio of the chest, abdomen, and pelvis showed dissection in the proximal descending thoracic aorta has changed and is now fenestrated with a large opening with contrast opacification of a 4.7 cm vertical segment of the proximal thoracic aorta. The aorta has also expanded in the AP diameter, now within AP diameter of 3.7 cm. She has bibasilar atelectasis and small bilateral  pleural effusions. In addition, there are innumerable cysts in the liver and kidneys are stable. Stable calculus in the lower pole of the left kidney.Dr.Gerhardt was  asked to admit the patient for further evaluation and management. Past Medical History   Diagnosis  Date   .  Hypertension    .  Family history of anesthesia complication      "alot of back pains from the epidurals"   .  Complication of anesthesia      "alot of back pains from the epidurals"   .  Anemia    .  Depression    .  GERD (gastroesophageal reflux disease)    .  Daily headache    .  Migraine      "sometimes twice/day" (07/05/2014)   .  Stroke  06/29/2014     residual BLE weakness   .  Dissecting aneurysm of thoracic aorta, Stanford type B  06/29/2014     [name] 07/05/2014   .  Polycystic kidney disease      /notes 07/05/2014   .  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  Arthritis      "shoulders" (07/05/2014)   .  Bipolar disorder    .  Anxiety     Past Surgical History   Procedure  Laterality  Date   .  Cesarean section   1986   .  Tubal ligation   1986   .  Spinal drain placement   06/29/2014   .  Spinal drain removal   07/02/2014   .  Thoracentesis   07/05/2014     /  notes 07/05/2014    History   Smoking status   .  Former Smoker -- 1.00 packs/day for 37 years   .  Types:  Cigarettes   Smokeless tobacco   .  Never Used    History   Alcohol Use   .  Yes     Comment: 07/05/2014 "recovering alcoholic since 2013"    History    Social History   .  Marital Status:  Single     Spouse Name:  N/A     Number of Children:  N/A   .  Years of Education:  N/A    Social History Main Topics   .  Smoking status:  Former Smoker -- 1.00 packs/day for 37 years     Types:  Cigarettes   .  Smokeless tobacco:  Never Used   .  Alcohol Use:  Yes      Comment: 07/05/2014 "recovering alcoholic since 2013"   .  Drug Use:  Yes     Special:  "Crack" cocaine      Comment:  07/05/2014 "recovering cocaine addict since 2013"   .  Sexual Activity:  Not on file    Allergies   Allergen  Reactions   .  Seroquel [Quetiapine]  Other (See Comments)     Night sweats, dizziness and possible confusion   .  Olanzapine  Nausea And Vomiting    No current facility-administered medications for this encounter.    Current Outpatient Prescriptions   Medication  Sig  Dispense  Refill   .  ALPRAZolam (XANAX) 0.25 MG tablet  Take 1 tablet (0.25 mg total) by mouth 3 (three) times daily as needed for anxiety.  30 tablet  0   .  atorvastatin (LIPITOR) 20 MG tablet  Take 1 tablet (20 mg total) by mouth daily at 6 PM.  30 tablet  1   .  bisacodyl (DULCOLAX) 10 MG suppository  Place 1 suppository (10 mg total) rectally daily at 6 (six) AM. For bowel program.  30 suppository  0   .  famotidine (PEPCID) 20 MG tablet  Take 1 tablet (20 mg total) by mouth 2 (two) times daily.  60 tablet  1   .  feeding supplement, ENSURE COMPLETE, (ENSURE COMPLETE) LIQD  Take 237 mLs by mouth 3 (three) times daily between meals.     .  gabapentin (NEURONTIN) 100 MG capsule  Take 2 capsules (200 mg total) by mouth 3 (three) times daily.  180 capsule  1   .  hydrALAZINE (APRESOLINE) 100 MG tablet  Take 100 mg by mouth 2 (two) times daily.     .  hydrochlorothiazide (MICROZIDE) 12.5 MG capsule  Take 1 capsule (12.5 mg total) by mouth daily.  30 capsule  1   .  labetalol (NORMODYNE) 300 MG tablet  Take 1 tablet (300 mg total) by mouth 2 (two) times daily.  60 tablet  1   .  methocarbamol (ROBAXIN) 500 MG tablet  Take 1 tablet (500 mg total) by mouth every 6 (six) hours as needed for muscle spasms.  60 tablet  0   .  polyethylene glycol (MIRALAX / GLYCOLAX) packet  Take 17 g by mouth daily after supper.  30 each  0   .  potassium chloride SA (K-DUR,KLOR-CON) 20 MEQ tablet  Take 1 tablet (20 mEq total) by mouth 2 (two) times daily.  60 tablet  1   .  sertraline (ZOLOFT) 100 MG tablet  Take 1  tablet (100 mg total) by  mouth daily.  30 tablet  1   .  traMADol (ULTRAM) 50 MG tablet  Take 1 tablet (50 mg total) by mouth every 6 (six) hours as needed for moderate pain.  45 tablet  0     (Not in a hospital admission)  Family History   Problem  Relation  Age of Onset   .  Stroke  Father    .  Diabetes type II  Other         Discharge Diagnoses:  Active Problems:   Thoracic aortic dissection  Patient Active Problem List   Diagnosis Date Noted  . Thoracic aortic dissection 08/22/2014  . Other secondary hypertension 08/01/2014  . Anxiety 07/29/2014  . Type 3 dissection of thoracic aorta 07/29/2014  . Chronic renal insufficiency, stage III (moderate) 07/19/2014  . Dyslipidemia 07/19/2014  . 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 Polycystic kidney disease 06/29/2014  . ANEMIA 02/09/2011  . TOBACCO ABUSE 02/09/2011  . COCAINE ABUSE 02/09/2011  . HTN (hypertension), malignant 02/09/2011   Problem List Items Addressed This Visit   None    Visit Diagnoses   Dissection of aorta    -  Primary    Relevant Medications       enoxaparin (LOVENOX) injection 30 mg       furosemide (LASIX) injection 40 mg (Completed)       hydrALAZINE (APRESOLINE) tablet 100 mg       atorvastatin (LIPITOR) tablet 20 mg (Start on 08/25/2014  6:00 PM)       hydrochlorothiazide (MICROZIDE) capsule 12.5 mg       labetalol (NORMODYNE) tablet 300 mg      Discharged Condition: stable  Hospital Course: The patient was admitted for further management and treatment of her aortic dissection. He was felt to be a candidate for endovascular stenting. This was scheduled and performed on 08/23/2014 with the following procedure performed:   Procedure: Thoracic aortic stent graft (Gore cTAG) repair of thoracic aortic penetrating ulcer  Preoperative diagnosis: Thoracic dissection with penetrating ulcer  Postoperative diagnosis: Same  Anesthesia: Gen.  Surgeon- Fabienne Bruns MD Co Surgeon: Ofilia Neas,  MD  Operative findings: #1 22 French dry seal sheath right femoral artery  #2 Proglide closure right femoral artery  #3 34 x 20 main body device extending from just distal to left subclavian artery to mid descending thoracic aorta  She tolerated the procedure well and was taken to the surgical intensive care unit in stable condition.    Postoperative hospital course: She has overall progressed nicely. She has remained unchanged regarding her neurologic exam. All tubes and lines have been discontinued in the standard fashion. She has remained hemodynamically stable. She has been able to sit in a chair. Groin site is  healing without evidence of infection/hematoma . Overall status is felt to be tentatively stable for discharge in the morning pending ongoing reevaluation of her recovery.    Consults: vascular surgery  Significant Diagnostic Studies: angiography: CT chest and abdomen  Treatments: TEVAR  Discharge Exam: Blood pressure 160/78, pulse 81, temperature 98.8 F (37.1 C), temperature source Oral, resp. rate 18, height 5\' 6"  (1.676 m), weight 145 lb 15.1 oz (66.2 kg), SpO2 96.00%.  General appearance: alert, cooperative and no distress  Heart: regular rate and rhythm  Lungs: clear to auscultation bilaterally  Abdomen: benign  Extremities: no edema, + DP pulses  Wound: ok, no hematoma  Disposition: 01-Home or Self Care  Medications at time of discharge:   Medication List         ALPRAZolam 0.25 MG tablet  Commonly known as:  XANAX  Take 1 tablet (0.25 mg total) by mouth 3 (three) times daily as needed for anxiety.     atorvastatin 20 MG tablet  Commonly known as:  LIPITOR  Take 1 tablet (20 mg total) by mouth daily at 6 PM.     bisacodyl 10 MG suppository  Commonly known as:  DULCOLAX  Place 1 suppository (10 mg total) rectally daily at 6 (six) AM. For bowel program.     famotidine 20 MG tablet  Commonly known as:  PEPCID  Take 1 tablet (20 mg total) by mouth 2 (two)  times daily.     feeding supplement (ENSURE COMPLETE) Liqd  Take 237 mLs by mouth 3 (three) times daily between meals.     gabapentin 100 MG capsule  Commonly known as:  NEURONTIN  Take 2 capsules (200 mg total) by mouth 3 (three) times daily.     hydrALAZINE 100 MG tablet  Commonly known as:  APRESOLINE  Take 1 tablet (100 mg total) by mouth 2 (two) times daily.     hydrochlorothiazide 12.5 MG capsule  Commonly known as:  MICROZIDE  Take 1 capsule (12.5 mg total) by mouth daily.     labetalol 300 MG tablet  Commonly known as:  NORMODYNE  Take 1 tablet (300 mg total) by mouth 2 (two) times daily.     methocarbamol 500 MG tablet  Commonly known as:  ROBAXIN  Take 1 tablet (500 mg total) by mouth every 6 (six) hours as needed for muscle spasms.     polyethylene glycol packet  Commonly known as:  MIRALAX / GLYCOLAX  Take 17 g by mouth daily after supper.     potassium chloride SA 20 MEQ tablet  Commonly known as:  K-DUR,KLOR-CON  Take 1 tablet (20 mEq total) by mouth 2 (two) times daily.     sertraline 100 MG tablet  Commonly known as:  ZOLOFT  Take 1 tablet (100 mg total) by mouth daily.     traMADol 50 MG tablet  Commonly known as:  ULTRAM  Take 1 tablet (50 mg total) by mouth every 6 (six) hours as needed for moderate pain.       Follow-up Information   Follow up with GERHARDT,EDWARD B, MD. (The office will contact you regarding an appointment in 4-6 weeks.)    Specialty:  Cardiothoracic Surgery   Contact information:   [address] [place] [address] [phone]      Signed: Rowe ClackGOLD,Lilleigh Hechavarria E 08/25/2014, 12:15 PM

## 2014-08-25 NOTE — Progress Notes (Addendum)
PT Cancellation Note  Patient Details Name: Rowe RobertHelene J Farha MRN: [id number] DOB: 1966-02-10   Cancelled Treatment:    Reason Eval/Treat Not Completed: Other (comment) (Pt confident she can return home with daughter assisting as before and doesn't need acute PT). Pt has all needed equipment at home. Recommend she resume HHPT at dc.   Jaishawn Witzke 08/25/2014, 1:48 PM

## 2014-08-25 NOTE — Op Note (Signed)
Charlotte [name], [name]:  [id number]  MEDICAL RECORD NO.:  [id number]  LOCATION:  1O10R                        FACILITY:  [facility]  PHYSICIAN:  Charlotte Plane, MD    DATE OF BIRTH:  Sep 21, 1966  DATE OF PROCEDURE:  08/23/2014 DATE OF DISCHARGE:                              OPERATIVE REPORT   PROCEDURE PERFORMED: 1. Placement of thoracic aortic stent graft Gore C-Tag 34 mm x 20 cm,     extending from distal to the left subclavian artery (without coverage of subclavian) to the mid     descending thoracic aorta for repair of thoracic aortic penetrating     ulcer. 2. ProGlide closure of the right femoral artery. 3. Cannulation of aorta with aortogram via right femoral artery.  CO-SURGEONS:  Dr. Sheliah [name], Thoracic Surgery; Dr. Fabienne Bruns, Vascular Surgery.  BRIEF HISTORY:  The patient is a 48 year old female, who approximately 2 months prior presented with a type 3 aortic dissection, primarily characterized by intramural hematoma of the descending thoracic aorta. Starting after the takeoff of the left subclavian. The patient originally presented with complete paraplegia.  The time when she was first seen, an epidural catheter was placed and intrathecal pressures were decreased with some improvement in lower extremity function, however, she remains paraplegic and not ambulatory.  She was transferred to rehab for a period of time and ultimately went home.  Since original presentation, she has had 2 CT scans that showed healing of the area; however, on the night prior to surgery, she presented to the emergency room again with chest pain and a repeat CTA of the chest revealed a large penetrating ulcer with persistent filling with the remainder of the aorta with resolving hematoma.  With this change from the previous scans, it was felt that placing a thoracic stent graft to cover this area would decrease the risk of rupture in the future.  This was discussed  with the patient.  She was aware that it is unlikely that any intervention would result with improvement of her neurologic status. Risks and options were discussed with the patient, who agreed and signed informed consent.  DESCRIPTION OF PROCEDURE:  Procedure was done with Vascular Surgery and Thoracic Surgery as co-surgeons.  The patient was placed in the supine position.  In the hybrid OR, central line and arterial lines had been placed.  She underwent general endotracheal anesthesia without incident. The skin of the chest down to the knees was prepped with Betadine and draped in usual sterile manner.  We initially gained access to the vascular tree through the right femoral artery as described in Dr. Darrick Penna note.  A Benson wire was threaded into the descending thoracic aorta under fluoroscopic guidance and 9-French sheath was used to dilate the track . Two ProGlide devices were placed over the guidewire and removed, 1 at 10 o'clock and 1 at 2 o'clock.  The [facility] wire was then advanced into the ascending aorta.  Then a 5-French Pigtail catheter was advanced and a contrast aortogram was obtained with 40 mL/20 at a 52 degree LAO projection.  This showed the patency of the innominate artery.  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  A Lunderquist wire was then placed through the pigtail and positioned into the ascending aorta.  The patient was given intravenous heparin.  The 9- French sheath was removed and replaced with a 22-French CryoSeal sheath and positioned into the right iliac artery.  From preoperative measurements, we selected a 34 x 20 C-Tag Gore device to deploy.  Prior to placing the device, buddy wire was placed with the Bentson wire and a second aortogram marking the takeoff of the left subclavian was used. We then positioned the device up first into the arch and then bringing it back so as  not to cover the left subclavian and to position along the outside curve of the aorta.  With the device in good position and holding the wire against the outer curve, the device was deployed satisfactorily.  The pigtail catheter was then positioned back through the device and a repeat aortogram was performed that showed that the ulcer had been covered.  Very late in the aortogram phase, there was a suggestion of slight contrast filling the sac late, though this was not definitive, we decided to balloon slightly using a trial balloon placed back over the Bentson. 2 gentle inflations were done to insure good apposition along the greater curve.  There was no evidence of proximal or distal of proximal endoleak. The pigtail catheter was then pulled back into the graft and an additional aortogram was done to confirm the patency of the celiac vessels which were proven to be widely patent. With satisfactory deployment of the graft, the deployment sheath was then removed over the guidewire.  The previously placed ProGlide's were secured in place keeping control vessel with the guidewire.  Because of some slight continued oozing, a 3rd ProGlide device was placed with good hemostasis.  The guidewire was removed.  Direct pressure was held for approximately 10 minutes and 50 mg of protamine was administered.  The wound was closed as described by Dr. Darrick PennaFields.  At the completion of procedure, sponge and needle count was reported as correct.  Estimated blood loss was less than 100 mL.  At the completion the procedure, the patient had easily palpable pedal pulses in both lower extremities.  She was awakened in the operating room, extubated, and transferred to the recovery room for postoperative observation.     Charlotte PlaneEdward Laresa Oshiro, MD     EG/MEDQ  D:  08/25/2014  T:  08/25/2014  Job:  [id number]

## 2014-08-25 NOTE — Progress Notes (Signed)
Patient ID: Charlotte [name], female   DOB: 09/13/1966, 48 y.o.   MRN: [id number] TCTS DAILY ICU PROGRESS NOTE                   [address]            [place] [address]          [phone]   2 Days Post-Op Procedure(s) (LRB): ENDOVASCULAR THORACIC AORTIC  STENT GRAFT (N/A)  Total Length of Stay:  LOS: 3 days   Subjective: No new complaints  Objective: Vital signs in last 24 hours: Temp:  [97.6 F (36.4 C)-100.1 F (37.8 C)] 97.7 F (36.5 C) (10/04 0400) Pulse Rate:  [63-96] 96 (10/04 0700) Cardiac Rhythm:  [-] Normal sinus rhythm (10/03 2000) Resp:  [12-29] 19 (10/04 0700) BP: (85-160)/(48-78) 107/58 mmHg (10/04 0700) SpO2:  [92 %-100 %] 97 % (10/04 0700) Arterial Line BP: (153-171)/(68-71) 171/71 mmHg (10/03 0900) Weight:  [145 lb 15.1 oz (66.2 kg)] 145 lb 15.1 oz (66.2 kg) (10/04 0600)  Filed Weights   08/22/14 1321 08/24/14 0600 08/25/14 0600  Weight: 140 lb (63.504 kg) 144 lb 6.4 oz (65.5 kg) 145 lb 15.1 oz (66.2 kg)    Weight change: 1 lb 8.7 oz (0.7 kg)   Hemodynamic parameters for last 24 hours:    Intake/Output from previous day: 10/03 0701 - 10/04 0700 In: 2210 [P.O.:960; I.V.:1100; IV Piggyback:150] Out: 650 [Urine:650]  Intake/Output this shift:    Current Meds: Scheduled Meds: . atorvastatin  20 mg Oral q1800  . docusate sodium  100 mg Oral Daily  . enoxaparin (LOVENOX) injection  30 mg Subcutaneous Q24H  . gabapentin  200 mg Oral TID  . hydrALAZINE  100 mg Oral BID  . labetalol  300 mg Oral BID  . pantoprazole  40 mg Oral Daily  . potassium chloride  10 mEq Intravenous Q1 Hr x 3  . sertraline  100 mg Oral Daily  . sodium chloride  3 mL Intravenous Q12H   Continuous Infusions: . dextrose 5 % and 0.45 % NaCl with KCl 20 mEq/L 50 mL/hr (08/25/14 0601)  . dextrose 5 % and 0.9% NaCl 125 mL/hr at 08/23/14 1200  . lactated ringers 50 mL/hr at 08/23/14 1315  . lactated ringers     PRN Meds:.sodium chloride, acetaminophen,  acetaminophen, ALPRAZolam, alum & mag hydroxide-simeth, docusate sodium, guaiFENesin-dextromethorphan, hydrALAZINE, HYDROcodone-acetaminophen, labetalol, metoprolol, ondansetron, ondansetron, phenol, polyethylene glycol, potassium chloride, sodium chloride, traMADol  General appearance: alert and cooperative Neurologic: neuro unchanged Heart: regular rate and rhythm, S1, S2 normal, no murmur, click, rub or gallop Lungs: clear to auscultation bilaterally Abdomen: soft, non-tender; bowel sounds normal; no masses,  no organomegaly Extremities: extremities normal, atraumatic, no cyanosis or edema and Homans sign is negative, no sign of DVT Wound: rt groin intact  Lab Results: CBC: Recent Labs  08/24/14 0338 08/25/14 0410  WBC 6.6 7.5  HGB 9.8* 9.0*  HCT 30.8* 27.6*  PLT 253 219   BMET:  Recent Labs  08/24/14 0338 08/25/14 0410  NA 142 136*  K 3.2* 3.7  CL 104 100  CO2 26 25  GLUCOSE 120* 115*  BUN 13 17  CREATININE 1.30* 1.44*  CALCIUM 8.8 8.7    PT/INR:  Recent Labs  08/23/14 1630  LABPROT 15.0  INR 1.18   Radiology: Dg Chest Portable 1 View  08/23/2014   CLINICAL DATA:  Central line placement  EXAM: PORTABLE CHEST - 1 VIEW  COMPARISON:  CT  08/22/2014, chest radiograph 08/22/2014  FINDINGS: Right central venous line placement with tip in the distal SVC. No pneumothorax. Endovascular stent graft noted which appears well expanded. Mild left basilar atelectasis.  IMPRESSION: Right central venous line in good position without pneumothorax.  Endovascular aortic stent graft appears well expanded.   Electronically Signed   By: Genevive BiStewart  Edmunds M.D.   On: 08/23/2014 16:33     Assessment/Plan: S/P Procedure(s) (LRB): ENDOVASCULAR THORACIC AORTIC  STENT GRAFT (N/A) Plan for transfer to step-down: see transfer orders Stable after stent graft Poss d/c tomorrow   Geneva Barrero B 08/25/2014 7:56 AM

## 2014-08-25 NOTE — Discharge Instructions (Signed)
Know About Your Bypass: C You have a __thoracic aortic stent graft____________________________________ bypass graft. Tell your other doctors and dentists that you have an aortic stent graft. For the next 6 months you may need to take antibiotics before any surgery or procedures that require an incision. Incision Care: C You may shower. Wash the incisions gently with soap and water and rinse well. Pat the incisions dry. Do not take a tub bath for a minimum of two weeks or if there are any open areas on the incision line. C Check your incisions daily for redness, swelling or drainage. C It is normal for your incisions to be sensitive and tender to touch. You may experience burning, tingling and numbness on the inner thigh below the incision lines. These feelings are normal and part of the healing process. The sensations may continue for 3 to 6 months. C Do not apply any creams, lotions, ointments or powders to the incision lines. Activity: C Do not drive for 2 weeks after surgery or if you are taking pain medications. If you have pain that makes turning or bending difficult, do not drive a car until you can move quickly without pain. You may ride in a car. C Do not lift anything heavier than 5 pounds for 2 weeks after surgery. A gallon of milk weighs about 8 pounds. C You may go up and down the stairs if you were able previously C Gradually increase your activity until you can do what is your usual routine. Walking is the best form of exercise after surgery if you are able . Start out slowly and gradually increase your walking distance. It is common to tire easily after surgery. This will improve each week. Try and space your activities and allow times to rest. Diet: Eating a well balanced diet will help your incisions heal. You may resume your usual diet unless you received special diet instructions before your discharge. Constipation: It is not uncommon to have constipation after  surgery. Some pain medications may also cause constipation. Make sure you are eating enough fiber in your diet and drinking plenty of fluids. You may take the laxative of your choice. If a laxative doesnt work, you may use an enema. Recovery: It is normal for your appetite, bowel movements, energy level and sleep patterns to change after surgery. These things will slowly return to normal as you recover and return to your normal routines and activities. Complete recovery may take several months so dont be discouraged. Remember, each persons recovery time is different. Because you have an aortic stent graft in place, you will require periodic CT scans of your chest after surgery. If a CT is not done before you leave the hospital, it will be done the day of your first follow up visit with the surgeon. CT scans will be done at least every six months for the first year and one time each year after that. What to do for long term success: C Do not smoke. Smoking causes the arteries to narrow and can prevent blood flow to vital organs. It contributes to "hardening of the arteries". If you need more information on how to quit smoking, please ask your doctor or nurse. C Control your blood pressure.  Please take any blood pressure medications as ordered. Follow up regularly with your medical doctor. C Control your cholesterol.  Eat foods low in saturated fats (animal fats) and trans fats. If you are on a cholesterol lowering medication, take it as prescribed. If needed,  a dietitian can speak with you and your family regarding healthy food choices. C Control your blood sugar. Keeping your blood sugar under control will help reduce your health risks from diabetes. This can affect not only your circulation, but your vision, kidney functions and general health. C Exercise regularly.  Begin to exercise on a regular basis. Exercise will help you maintain a normal body weight. Walking is the best  exercise to help improve circulation to your legs. C Protect your feet.  Never go barefoot. Wear well fitting shoes. Do not use sharp instruments to remove calluses from your feet. Keep your feet well moisturized with lanolin based lotion daily. Inspect your feet daily for any injuries. C Keep your regularly scheduled doctor appointments. When to call your doctor: Call your doctor if you have any of the following problems: < Redness, increased pain, swelling or drainage from your incisions < A temperature of 101 degrees or higher < If your leg would suddenly become cold, pale or painful or if you have difficulty moving or feeling your legs. < A sudden onset of unusual or severe pain in your legs or abdomen. < Nausea or vomiting < Pain or heaviness in your chest or arms or difficulty breathing. < Please call the office with any questions or concerns or if you are unsure of what to do when you go home.

## 2014-08-25 NOTE — Progress Notes (Signed)
[facility]  Subjective  - No complaints   Objective 119/64 91 97.7 F (36.5 C) (Oral) 17 96%  Intake/Output Summary (Last 24 hours) at 08/25/14 0827 Last data filed at 08/25/14 0800  Gross per 24 hour  Intake   2260 ml  Output    550 ml  Net   1710 ml   2+ DP pulses Right groin no hematoma Moves toes otherwise baseline paraplegia  Assessment/Planning: Doing well post TEVAR chest pain resolved Pt will follow up with Dr Caro LarocheGerhardt  FIELDS,CHARLES E 08/25/2014 8:27 AM --  Laboratory Lab Results:  Recent Labs  08/24/14 0338 08/25/14 0410  WBC 6.6 7.5  HGB 9.8* 9.0*  HCT 30.8* 27.6*  PLT 253 219   BMET  Recent Labs  08/24/14 0338 08/25/14 0410  NA 142 136*  K 3.2* 3.7  CL 104 100  CO2 26 25  GLUCOSE 120* 115*  BUN 13 17  CREATININE 1.30* 1.44*  CALCIUM 8.8 8.7    COAG Lab Results  Component Value Date   INR 1.18 08/23/2014   INR 1.09 08/22/2014   INR 1.11 08/04/2014   No results found for this basename: PTT

## 2014-08-25 NOTE — Progress Notes (Signed)
Transferred to 2W27 via bed. Portable monitor on. Report given to Darreld Mcleanee Murphy, RN. No changes.

## 2014-08-26 ENCOUNTER — Encounter (HOSPITAL_COMMUNITY): Payer: Self-pay | Admitting: Vascular Surgery

## 2014-08-26 LAB — TYPE AND SCREEN
ABO/RH(D): O POS
Antibody Screen: NEGATIVE
UNIT DIVISION: 0
UNIT DIVISION: 0
Unit division: 0
Unit division: 0

## 2014-08-26 MED ORDER — ALPRAZOLAM 0.25 MG PO TABS: 0.2500 mg | ORAL_TABLET | Freq: Three times a day (TID) | ORAL | Status: DC | PRN

## 2014-08-26 MED ORDER — ATORVASTATIN CALCIUM 20 MG PO TABS: 20.0000 mg | ORAL_TABLET | Freq: Every day | ORAL | Status: DC

## 2014-08-26 MED ORDER — METHOCARBAMOL 500 MG PO TABS: 500.0000 mg | ORAL_TABLET | Freq: Four times a day (QID) | ORAL | Status: DC | PRN

## 2014-08-26 MED ORDER — SERTRALINE HCL 100 MG PO TABS: 100.0000 mg | ORAL_TABLET | Freq: Every day | ORAL | Status: DC

## 2014-08-26 MED ORDER — GABAPENTIN 100 MG PO CAPS: 200.0000 mg | ORAL_CAPSULE | Freq: Three times a day (TID) | ORAL | Status: DC

## 2014-08-26 MED ORDER — FAMOTIDINE 20 MG PO TABS: 20.0000 mg | ORAL_TABLET | Freq: Two times a day (BID) | ORAL | Status: DC

## 2014-08-26 MED ORDER — HYDRALAZINE HCL 100 MG PO TABS: 100.0000 mg | ORAL_TABLET | Freq: Two times a day (BID) | ORAL | Status: DC

## 2014-08-26 MED ORDER — POTASSIUM CHLORIDE CRYS ER 20 MEQ PO TBCR: 20.0000 meq | EXTENDED_RELEASE_TABLET | Freq: Two times a day (BID) | ORAL | Status: DC

## 2014-08-26 MED ORDER — LABETALOL HCL 300 MG PO TABS: 300.0000 mg | ORAL_TABLET | Freq: Two times a day (BID) | ORAL | Status: DC

## 2014-08-26 MED ORDER — HYDROCHLOROTHIAZIDE 12.5 MG PO CAPS: 12.5000 mg | ORAL_CAPSULE | Freq: Every day | ORAL | Status: DC

## 2014-08-26 MED ORDER — TRAMADOL HCL 50 MG PO TABS: 50.0000 mg | ORAL_TABLET | Freq: Four times a day (QID) | ORAL | Status: DC | PRN

## 2014-08-26 NOTE — Progress Notes (Signed)
     Subjective  - doing well.  No new back or chest pain.   Objective 128/73 90 98.1 F (36.7 C) (Oral) 18 97%  Intake/Output Summary (Last 24 hours) at 08/26/14 0728 Last data filed at 08/25/14 2031  Gross per 24 hour  Intake    630 ml  Output      0 ml  Net    630 ml    Palpable DP 2+ bilateral Paraplegia   Assessment/Planning: POD #3 TEVAR    Charlotte GallantCOLLINS, EMMA [facility] Center[facility]EN 08/26/2014 7:28 AM --  Laboratory Lab Results:  Recent Labs  08/24/14 0338 08/25/14 0410  WBC 6.6 7.5  HGB 9.8* 9.0*  HCT 30.8* 27.6*  PLT 253 219   BMET  Recent Labs  08/24/14 0338 08/25/14 0410  NA 142 136*  K 3.2* 3.7  CL 104 100  CO2 26 25  GLUCOSE 120* 115*  BUN 13 17  CREATININE 1.30* 1.44*  CALCIUM 8.8 8.7    COAG Lab Results  Component Value Date   INR 1.18 08/23/2014   INR 1.09 08/22/2014   INR 1.11 08/04/2014   No results found for this basename: PTT

## 2014-08-26 NOTE — Progress Notes (Addendum)
      [address]       [place] [id number]             [phone]      3 Days Post-Op Procedure(s) (LRB): ENDOVASCULAR THORACIC AORTIC  STENT GRAFT (N/A) Subjective: Feels ok, wants to go home   Objective: Vital signs in last 24 hours: Temp:  [98.1 F (36.7 C)-98.8 F (37.1 C)] 98.1 F (36.7 C) (10/05 0505) Pulse Rate:  [64-93] 90 (10/05 0505) Cardiac Rhythm:  [-] Normal sinus rhythm (10/04 2000) Resp:  [17-21] 18 (10/05 0505) BP: (119-160)/(64-79) 128/73 mmHg (10/05 0505) SpO2:  [93 %-97 %] 97 % (10/05 0505)  Hemodynamic parameters for last 24 hours:    Intake/Output from previous day: 10/04 0701 - 10/05 0700 In: 630 [P.O.:480; I.V.:50; IV Piggyback:100] Out: -  Intake/Output this shift:    General appearance: alert, cooperative and no distress Heart: regular rate and rhythm Lungs: clear to auscultation bilaterally Abdomen: benign Extremities: no edema, + DP pulses Wound: ok  Lab Results:  Recent Labs  08/24/14 0338 08/25/14 0410  WBC 6.6 7.5  HGB 9.8* 9.0*  HCT 30.8* 27.6*  PLT 253 219   BMET:  Recent Labs  08/24/14 0338 08/25/14 0410  NA 142 136*  K 3.2* 3.7  CL 104 100  CO2 26 25  GLUCOSE 120* 115*  BUN 13 17  CREATININE 1.30* 1.44*  CALCIUM 8.8 8.7    PT/INR:  Recent Labs  08/23/14 1630  LABPROT 15.0  INR 1.18   ABG    Component Value Date/Time   TCO2 25 08/01/2014 1043   CBG (last 3)  No results found for this basename: GLUCAP,  in the last 72 hours  Meds Scheduled Meds: . atorvastatin  20 mg Oral q1800  . bisacodyl  10 mg Rectal Q0600  . docusate sodium  100 mg Oral Daily  . enoxaparin (LOVENOX) injection  30 mg Subcutaneous Q24H  . famotidine  20 mg Oral BID  . feeding supplement (ENSURE COMPLETE)  237 mL Oral TID BM  . gabapentin  200 mg Oral TID  . hydrALAZINE  100 mg Oral BID  . hydrochlorothiazide  12.5 mg Oral Daily  . Influenza vac split quadrivalent PF  0.5 mL Intramuscular Tomorrow-1000  .  labetalol  300 mg Oral BID  . polyethylene glycol  17 g Oral QPC supper  . potassium chloride SA  20 mEq Oral BID  . sertraline  100 mg Oral Daily   Continuous Infusions:  PRN Meds:.acetaminophen, acetaminophen, ALPRAZolam, alum & mag hydroxide-simeth, methocarbamol, traMADol  Xrays No results found.  Assessment/Plan: S/P Procedure(s) (LRB): ENDOVASCULAR THORACIC AORTIC  STENT GRAFT (N/A) Plan for discharge: see discharge orders   LOS: 4 days    GOLD,WAYNE E 08/26/2014  BP meds renewed Home today Follow up cta chest 5-6 weeks I have seen and examined Charlotte RobertHelene J [name] and agree with the above assessment  and plan.  Charlotte OvensEdward B Louna Rothgeb MD [facility] [phone] Office [phone] 08/26/2014 12:21 PM

## 2014-08-26 NOTE — Progress Notes (Signed)
OT Cancellation Note  Patient Details Name: Rowe RobertHelene J Mindel MRN: [id number] DOB: 10-09-1966   Cancelled Treatment:    Reason Eval/Treat Not Completed: OT screened, no needs identified, will sign off Will defer OT eval and [facility] as pt is discharging home today.   Vincent Streater OTR/L 08/26/2014, 2:00 PM

## 2014-09-02 ENCOUNTER — Other Ambulatory Visit: Payer: Self-pay | Admitting: Cardiothoracic Surgery

## 2014-09-02 DIAGNOSIS — I71019 Dissection of thoracic aorta, unspecified: Secondary | ICD-10-CM

## 2014-09-02 DIAGNOSIS — I7101 Dissection of thoracic aorta: Secondary | ICD-10-CM

## 2014-09-04 ENCOUNTER — Telehealth: Payer: Self-pay

## 2014-09-04 NOTE — Telephone Encounter (Signed)
Agree 

## 2014-09-04 NOTE — Telephone Encounter (Signed)
Patient's daughter calling saying her mother is having "lung pain."  She st that she made her mom lay on her side and she breathes better. I asked to speak with the patient, and she was breathing so quickly I could not understand her words.  Pt's daughter said she thinks the pt is having a panic attack, that she does not have any more Xanax.  Spoke with Norma FredricksonLori Gerhardt, and per Lawson FiscalLori told pt's daughter to call her PCP and explain symptoms and get a refill for her Xanax.  Explained to pt's daughter if she cannot calm down, they will not refill the medication, or breathing does not get better, she would have to call EMS.  Pt's daughter agrees with treatment plan.

## 2014-09-05 ENCOUNTER — Ambulatory Visit: Payer: Medicaid Other | Admitting: Cardiothoracic Surgery

## 2014-09-05 ENCOUNTER — Other Ambulatory Visit: Payer: Self-pay | Admitting: *Deleted

## 2014-09-05 DIAGNOSIS — I7101 Dissection of thoracic aorta: Secondary | ICD-10-CM

## 2014-09-05 DIAGNOSIS — I71019 Dissection of thoracic aorta, unspecified: Secondary | ICD-10-CM

## 2014-09-10 ENCOUNTER — Encounter: Payer: Medicaid Other | Attending: Physical Medicine & Rehabilitation | Admitting: Physical Medicine & Rehabilitation

## 2014-10-10 ENCOUNTER — Ambulatory Visit: Payer: Self-pay | Admitting: Cardiothoracic Surgery

## 2014-10-10 ENCOUNTER — Encounter (HOSPITAL_COMMUNITY): Payer: Self-pay | Admitting: *Deleted

## 2014-10-10 ENCOUNTER — Other Ambulatory Visit: Payer: Self-pay

## 2014-10-10 ENCOUNTER — Emergency Department (HOSPITAL_COMMUNITY)
Admission: EM | Admit: 2014-10-10 | Discharge: 2014-10-10 | Disposition: A | Discharge status: Home / Self Care | Payer: Medicaid Other | Attending: Emergency Medicine | Admitting: Emergency Medicine

## 2014-10-10 ENCOUNTER — Emergency Department (HOSPITAL_COMMUNITY): Payer: Medicaid Other

## 2014-10-10 DIAGNOSIS — R0682 Tachypnea, not elsewhere classified: Secondary | ICD-10-CM | POA: Insufficient documentation

## 2014-10-10 DIAGNOSIS — R0602 Shortness of breath: Secondary | ICD-10-CM

## 2014-10-10 DIAGNOSIS — Z79899 Other long term (current) drug therapy: Secondary | ICD-10-CM | POA: Insufficient documentation

## 2014-10-10 DIAGNOSIS — Z862 Personal history of diseases of the blood and blood-forming organs and certain disorders involving the immune mechanism: Secondary | ICD-10-CM | POA: Insufficient documentation

## 2014-10-10 DIAGNOSIS — Z8673 Personal history of transient ischemic attack (TIA), and cerebral infarction without residual deficits: Secondary | ICD-10-CM | POA: Insufficient documentation

## 2014-10-10 DIAGNOSIS — R079 Chest pain, unspecified: Secondary | ICD-10-CM | POA: Diagnosis not present

## 2014-10-10 DIAGNOSIS — G43909 Migraine, unspecified, not intractable, without status migrainosus: Secondary | ICD-10-CM | POA: Diagnosis not present

## 2014-10-10 DIAGNOSIS — F329 Major depressive disorder, single episode, unspecified: Secondary | ICD-10-CM | POA: Diagnosis not present

## 2014-10-10 DIAGNOSIS — Z720 Tobacco use: Secondary | ICD-10-CM | POA: Diagnosis not present

## 2014-10-10 DIAGNOSIS — Z87718 Personal history of other specified (corrected) congenital malformations of genitourinary system: Secondary | ICD-10-CM | POA: Diagnosis not present

## 2014-10-10 DIAGNOSIS — M1990 Unspecified osteoarthritis, unspecified site: Secondary | ICD-10-CM | POA: Insufficient documentation

## 2014-10-10 DIAGNOSIS — K219 Gastro-esophageal reflux disease without esophagitis: Secondary | ICD-10-CM | POA: Diagnosis not present

## 2014-10-10 DIAGNOSIS — F419 Anxiety disorder, unspecified: Secondary | ICD-10-CM | POA: Insufficient documentation

## 2014-10-10 DIAGNOSIS — I10 Essential (primary) hypertension: Secondary | ICD-10-CM | POA: Insufficient documentation

## 2014-10-10 LAB — I-STAT TROPONIN, ED
TROPONIN I, POC: 0 ng/mL (ref 0.00–0.08)
TROPONIN I, POC: 0.01 ng/mL (ref 0.00–0.08)

## 2014-10-10 LAB — I-STAT CHEM 8, ED
BUN: 24 mg/dL — ABNORMAL HIGH (ref 6–23)
CREATININE: 1.4 mg/dL — AB (ref 0.50–1.10)
Calcium, Ion: 1.35 mmol/L — ABNORMAL HIGH (ref 1.12–1.23)
Chloride: 107 mEq/L (ref 96–112)
Glucose, Bld: 96 mg/dL (ref 70–99)
HEMATOCRIT: 42 % (ref 36.0–46.0)
Hemoglobin: 14.3 g/dL (ref 12.0–15.0)
POTASSIUM: 3.2 meq/L — AB (ref 3.7–5.3)
SODIUM: 143 meq/L (ref 137–147)
TCO2: 24 mmol/L (ref 0–100)

## 2014-10-10 LAB — CBC WITH DIFFERENTIAL/PLATELET
BASOS ABS: 0 10*3/uL (ref 0.0–0.1)
BASOS PCT: 1 % (ref 0–1)
EOS ABS: 0.2 10*3/uL (ref 0.0–0.7)
Eosinophils Relative: 5 % (ref 0–5)
HCT: 40.1 % (ref 36.0–46.0)
Hemoglobin: 12.5 g/dL (ref 12.0–15.0)
LYMPHS ABS: 1.7 10*3/uL (ref 0.7–4.0)
Lymphocytes Relative: 36 % (ref 12–46)
MCH: 25.7 pg — AB (ref 26.0–34.0)
MCHC: 31.2 g/dL (ref 30.0–36.0)
MCV: 82.3 fL (ref 78.0–100.0)
Monocytes Absolute: 0.4 10*3/uL (ref 0.1–1.0)
Monocytes Relative: 8 % (ref 3–12)
NEUTROS PCT: 50 % (ref 43–77)
Neutro Abs: 2.5 10*3/uL (ref 1.7–7.7)
PLATELETS: 266 10*3/uL (ref 150–400)
RBC: 4.87 MIL/uL (ref 3.87–5.11)
RDW: 16.2 % — AB (ref 11.5–15.5)
WBC: 4.9 10*3/uL (ref 4.0–10.5)

## 2014-10-10 MED ORDER — HYDROCODONE-ACETAMINOPHEN 5-325 MG PO TABS
1.0000 | ORAL_TABLET | Freq: Once | ORAL | Status: AC
  Administered 2014-10-10: 1 via ORAL
  Filled 2014-10-10: qty 1

## 2014-10-10 MED ORDER — POTASSIUM CHLORIDE CRYS ER 20 MEQ PO TBCR
40.0000 meq | EXTENDED_RELEASE_TABLET | Freq: Once | ORAL | Status: AC
  Administered 2014-10-10: 40 meq via ORAL
  Filled 2014-10-10: qty 2

## 2014-10-10 MED ORDER — LORAZEPAM 2 MG/ML IJ SOLN
1.0000 mg | Freq: Once | INTRAMUSCULAR | Status: AC
  Administered 2014-10-10: 1 mg via INTRAVENOUS
  Filled 2014-10-10: qty 1

## 2014-10-10 MED ORDER — IOHEXOL 350 MG/ML SOLN
100.0000 mL | Freq: Once | INTRAVENOUS | Status: AC | PRN
  Administered 2014-10-10: 80 mL via INTRAVENOUS

## 2014-10-10 NOTE — ED Provider Notes (Signed)
Medical screening examination/treatment/procedure(s) were conducted as a shared visit with non-physician practitioner(s) and myself.  I personally evaluated the patient during the encounter.   EKG Interpretation   Date/Time:  Thursday October 10 2014 15:25:37 EST Ventricular Rate:  123 PR Interval:  129 QRS Duration: 85 QT Interval:  319 QTC Calculation: 456 R Axis:   69 Text Interpretation:  Sinus tachycardia Probable LVH with secondary repol  abnrm Confirmed by WARD,  DO, KRISTEN [phone] on 10/10/2014 3:40:28 PM      Pt is a 48 y.o. 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 She is also experiencing shortness of breath. She appears very anxious on exam, tachycardic, tachypneic, hyperventilating but is able to be redirected and once she is more calm her symptoms improved.  CT scan of her chest, abdomen and pelvis shows no new dissection, graft appears patent. No large pulmonary embolus. Cardiac enzymes negative. Suspect symptoms are largely secondary to anxiety. Patient family agree. We'll discharge home with close outpatient follow-up. Case management has seen the patient to help establish a PCP.  Layla MawKristen N Ward, DO 10/10/14 1941

## 2014-10-10 NOTE — Discharge Instructions (Signed)
Continue your regular home medications as directed. [facility] Monday as Charlotte [name] has scheduled for you. Return to the ED for new or worsening symptoms.

## 2014-10-10 NOTE — ED Provider Notes (Signed)
CSN: [id number]     Arrival date & time 10/10/14  1519 History   First MD Initiated Contact with Patient 10/10/14 1523     Chief Complaint  Patient presents with  . Shortness of Breath     (Consider location/radiation/quality/duration/timing/severity/associated sxs/prior Treatment) Patient is a 48 y.o. female presenting with shortness of breath. The history is provided by the patient and medical records.  Shortness of Breath   This is a 48 y.o. 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  Daughter who lives with patient that reports patient began feeling SOB earlier this morning while talking to her on the phone.  Daughter went home to check on her mother and found her hyperventilating.  Daughter reports patient has been out of 2 of her medications for the past month, unsure which 2 because her sister usually manages her medications.  States they attempted to call PCP to get scripts filled for more anxiety medication but she did not improve so was told to bring her to the ED.  EMS reports that patient hyperventilated all the way to the ED.  Daughter notes that patient has been having intermittent anxiety attacks since Mid-August.  States when this occurs, she will hyperventilate (similar to today) clutch her chest, and complain that her heart is going to explode.  No reported fever, chills, sweats, cough, or other URI symptoms.  No nausea or vomiting.  Patient does not currently have a PCP as her orange card has expired.    Past Medical History  Diagnosis Date  . Hypertension   . Family history of anesthesia complication     "alot of back pains from the epidurals"  . Complication of anesthesia     "alot of back pains from the epidurals"  . Anemia   . Depression   . GERD (gastroesophageal reflux disease)   . Daily headache   .  Migraine     "sometimes twice/day" (07/05/2014)  . Stroke 06/29/2014    residual BLE weakness  . Dissecting aneurysm of thoracic aorta, Stanford type B 06/29/2014    /notes 07/05/2014  . Polycystic kidney disease     /notes 07/05/2014  . 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 Arthritis     "shoulders" (07/05/2014)  . Bipolar disorder   . Anxiety    Past Surgical History  Procedure Laterality Date  . Cesarean section  1986  . Tubal ligation  1986  . Spinal drain placement  06/29/2014  . Spinal drain removal  07/02/2014  . Thoracentesis  07/05/2014    /notes 07/05/2014  . Thoracic aortic endovascular stent graft N/A 08/23/2014    Procedure: ENDOVASCULAR THORACIC AORTIC  STENT GRAFT;  Surgeon: Charles E Fields, MD;  Location: [facility] OR;  Service: Vascular;  Laterality: N/A;   Family History  Problem Relation Age of Onset  . Stroke Father   . Diabetes type II Other    History  Substance Use Topics  . Smoking status: Current Every Day Smoker -- 1.00 packs/day for 37 years    Types: Cigarettes  . Smokeless tobacco: Never Used  . Alcohol Use: Yes     Comment: 07/05/2014 "recovering alcoholic since 2013"   OB History    No data available     Review of Systems  Respiratory: Positive for   shortness of breath.   Psychiatric/Behavioral: The patient is nervous/anxious.   All other systems reviewed and are negative.     Allergies  Seroquel and Olanzapine  Home Medications   Prior to Admission medications   Medication Sig Start Date End Date Taking? Authorizing Provider  ALPRAZolam (XANAX) 0.25 MG tablet Take 1 tablet (0.25 mg total) by mouth 3 (three) times daily as needed for anxiety. 08/26/14   Wayne E Gold, PA-C  atorvastatin (LIPITOR) 20 MG tablet Take 1 tablet (20 mg total) by mouth daily at 6 PM. 08/26/14   Wayne E Gold, PA-C  bisacodyl (DULCOLAX) 10 MG suppository Place 1 suppository (10 mg total) rectally daily at 6 (six) AM. For bowel  program. 07/25/14   Ivan Anchors Love, PA-C  famotidine (PEPCID) 20 MG tablet Take 1 tablet (20 mg total) by mouth 2 (two) times daily. 08/26/14   John Giovanni, PA-C  feeding supplement, ENSURE COMPLETE, (ENSURE COMPLETE) LIQD Take 237 mLs by mouth 3 (three) times daily between meals. 07/08/14   Delfina Redwood, MD  gabapentin (NEURONTIN) 100 MG capsule Take 2 capsules (200 mg total) by mouth 3 (three) times daily. 08/26/14   Wayne E Gold, PA-C  hydrALAZINE (APRESOLINE) 100 MG tablet Take 1 tablet (100 mg total) by mouth 2 (two) times daily. 08/26/14   Wayne E Gold, PA-C  hydrochlorothiazide (MICROZIDE) 12.5 MG capsule Take 1 capsule (12.5 mg total) by mouth daily. 08/26/14   Wayne E Gold, PA-C  labetalol (NORMODYNE) 300 MG tablet Take 1 tablet (300 mg total) by mouth 2 (two) times daily. 08/26/14   Wayne E Gold, PA-C  methocarbamol (ROBAXIN) 500 MG tablet Take 1 tablet (500 mg total) by mouth every 6 (six) hours as needed for muscle spasms. 08/26/14   Wayne E Gold, PA-C  polyethylene glycol (MIRALAX / GLYCOLAX) packet Take 17 g by mouth daily after supper. 07/25/14   Ivan Anchors Love, PA-C  potassium chloride SA (K-DUR,KLOR-CON) 20 MEQ tablet Take 1 tablet (20 mEq total) by mouth 2 (two) times daily. 08/26/14   Wayne E Gold, PA-C  sertraline (ZOLOFT) 100 MG tablet Take 1 tablet (100 mg total) by mouth daily. 08/26/14   Wayne E Gold, PA-C  traMADol (ULTRAM) 50 MG tablet Take 1 tablet (50 mg total) by mouth every 6 (six) hours as needed for moderate pain. 08/26/14   Wayne E Gold, PA-C   BP 145/76 mmHg  Pulse 110  Temp(Src) 97.5 F (36.4 C) (Oral)  Resp 15  SpO2 100%   Physical Exam  Constitutional: [name], place, and time. She appears well-developed and well-nourished. No distress.  HENT:  Head: Normocephalic and atraumatic.  Mouth/Throat: Oropharynx is clear and moist.  Eyes: Conjunctivae and EOM are normal. Pupils are equal, round, and reactive to light.  Neck: Normal range of motion.  Neck supple.  Cardiovascular: Normal rate, regular rhythm, normal heart sounds, intact distal pulses and normal pulses.   DP pulses intact bilaterally  Pulmonary/Chest: Effort normal and breath sounds normal. Tachypnea noted. She has no wheezes. She has no rhonchi.  Tachypneic, hyperventilating  Abdominal: Soft. Bowel sounds are normal. There is no tenderness. There is no guarding.  Musculoskeletal: Normal range of motion.  Neurological: She is alert and oriented to person, place, and time.  Skin: Skin is warm and dry. She is not diaphoretic.  Psychiatric: Her mood appears anxious.  Very anxious appearing  Nursing note and vitals reviewed.   ED Course  Procedures (including critical  care time) Labs Review Labs Reviewed  CBC WITH DIFFERENTIAL - Abnormal; Notable for the following:    MCH 25.7 (*)    RDW 16.2 (*)    All other components within normal limits  I-STAT CHEM 8, ED - Abnormal; Notable for the following:    Potassium 3.2 (*)    BUN 24 (*)    Creatinine, Ser 1.40 (*)    Calcium, Ion 1.35 (*)    All other components within normal limits  I-STAT TROPOININ, ED  Randolm Idol, ED    Imaging Review Dg Chest Port 1 View  10/10/2014   CLINICAL DATA:  Short of breath, chest pain  EXAM: PORTABLE CHEST - 1 VIEW  COMPARISON:  Radiograph 08/23/2014  FINDINGS: Normal cardiac silhouette. Thoracic aortic stent graft is in place appears well expanded. No pulmonary edema. No pleural fluid. No pneumothorax. Removal of right central venous line.  IMPRESSION: 1. No acute cardiopulmonary process. 2. Aortic stent graft appears well expanded.   Electronically Signed   By: Suzy Bouchard M.D.   On: 10/10/2014 16:49   Ct Angio Chest Aorta W/cm &/or Wo/cm  10/10/2014   CLINICAL DATA:  Shortness of breath this morning, anxiety, hyperventilation, chest pain, history Stanford Type B aortic dissection, hypertension, smoker  EXAM: CT ANGIOGRAPHY CHEST, ABDOMEN AND PELVIS  TECHNIQUE: Multidetector  CT imaging through the chest, abdomen and pelvis was performed using the standard protocol during bolus administration of intravenous contrast. Multiplanar reconstructed images and MIPs were obtained and reviewed to evaluate the vascular anatomy.  CONTRAST:  30m OMNIPAQUE IOHEXOL 350 MG/ML SOLN IV  COMPARISON:  CTA chest abdomen and pelvis 08/22/2014  FINDINGS: CTA CHEST FINDINGS  Atherosclerotic calcification at aortic arch.  Endostent extending from aortic arch through descending thoracic aorta new since prior exam.  Aorta appears patent with normal enhancement of the aortic lumen and a stent.  False lumen of aortic dissection seen previously is no longer identified.  No evidence of periaortic hemorrhage or intramural hematoma.  Great vessels patent without aneurysm or dissection.  Ascending aorta normal caliber.  Pulmonary arteries patent.  No thoracic adenopathy.  Dependent atelectasis in BILATERAL lower lobes.  No pulmonary infiltrate, pleural effusion or pneumothorax.  No acute osseous findings.  Review of the MIP images confirms the above findings.  CTA ABDOMEN AND PELVIS FINDINGS  Scattered atherosclerotic calcification abdominal aorta without aneurysm.  Two small collections of extraluminal contrast or seen along the posterior wall of the abdominal aorta images 155 and 168 compatible with areas of known dissection, occurring at lumbar artery origins, unchanged.  No new areas of dissection, wall thickening, or periaortic hemorrhage.  Scattered at calcifications of the iliac systems which are patent and normal caliber.  Enlarged polycystic kidneys with extensive hepatic cystic disease as well similar to previous exam.  Pancreas, spleen, and adrenal glands unremarkable.  Bladder, uterus and adnexae normal appearance.  Normal appendix.  Stomach and small bowel loops unremarkable.  Unable to exclude wall thickening of the transverse colon or rectum, which are under distended.  Scattered colonic diverticulosis  without diverticulitis changes.  No mass, adenopathy, or free fluid.  Review of the MIP images confirms the above findings.  IMPRESSION: Interval endostenting of the descending thoracic aorta from arch to diaphragm.  Previously identified false lumen of the descending thoracic aortic dissection is no longer seen.  Dependent atelectasis in both lower lobes.  No additional acute intra thoracic abnormalities.  Polycystic kidney disease with multi-cystic changes of the liver as well.  Two small foci of residual contrast opacification of the false lumen of the abdominal aortic dissection are still identified posterior to the abdominal aorta at areas of lumbar artery origins, similar to previous exam.  No new intra-abdominal or intrapelvic abnormalities.   Electronically Signed   By: Lavonia Dana M.D.   On: 10/10/2014 19:23   Ct Angio Abd/pel W/ And/or W/o  10/10/2014   CLINICAL DATA:  Shortness of breath this morning, anxiety, hyperventilation, chest pain, history Stanford Type B aortic dissection, hypertension, smoker  EXAM: CT ANGIOGRAPHY CHEST, ABDOMEN AND PELVIS  TECHNIQUE: Multidetector CT imaging through the chest, abdomen and pelvis was performed using the standard protocol during bolus administration of intravenous contrast. Multiplanar reconstructed images and MIPs were obtained and reviewed to evaluate the vascular anatomy.  CONTRAST:  48m OMNIPAQUE IOHEXOL 350 MG/ML SOLN IV  COMPARISON:  CTA chest abdomen and pelvis 08/22/2014  FINDINGS: CTA CHEST FINDINGS  Atherosclerotic calcification at aortic arch.  Endostent extending from aortic arch through descending thoracic aorta new since prior exam.  Aorta appears patent with normal enhancement of the aortic lumen and a stent.  False lumen of aortic dissection seen previously is no longer identified.  No evidence of periaortic hemorrhage or intramural hematoma.  Great vessels patent without aneurysm or dissection.  Ascending aorta normal caliber.  Pulmonary  arteries patent.  No thoracic adenopathy.  Dependent atelectasis in BILATERAL lower lobes.  No pulmonary infiltrate, pleural effusion or pneumothorax.  No acute osseous findings.  Review of the MIP images confirms the above findings.  CTA ABDOMEN AND PELVIS FINDINGS  Scattered atherosclerotic calcification abdominal aorta without aneurysm.  Two small collections of extraluminal contrast or seen along the posterior wall of the abdominal aorta images 155 and 168 compatible with areas of known dissection, occurring at lumbar artery origins, unchanged.  No new areas of dissection, wall thickening, or periaortic hemorrhage.  Scattered at calcifications of the iliac systems which are patent and normal caliber.  Enlarged polycystic kidneys with extensive hepatic cystic disease as well similar to previous exam.  Pancreas, spleen, and adrenal glands unremarkable.  Bladder, uterus and adnexae normal appearance.  Normal appendix.  Stomach and small bowel loops unremarkable.  Unable to exclude wall thickening of the transverse colon or rectum, which are under distended.  Scattered colonic diverticulosis without diverticulitis changes.  No mass, adenopathy, or free fluid.  Review of the MIP images confirms the above findings.  IMPRESSION: Interval endostenting of the descending thoracic aorta from arch to diaphragm.  Previously identified false lumen of the descending thoracic aortic dissection is no longer seen.  Dependent atelectasis in both lower lobes.  No additional acute intra thoracic abnormalities.  Polycystic kidney disease with multi-cystic changes of the liver as well.  Two small foci of residual contrast opacification of the false lumen of the abdominal aortic dissection are still identified posterior to the abdominal aorta at areas of lumbar artery origins, similar to previous exam.  No new intra-abdominal or intrapelvic abnormalities.   Electronically Signed   By: MLavonia DanaM.D.   On: 10/10/2014 19:23     EKG  Interpretation   Date/Time:  Thursday October 10 2014 15:25:37 EST Ventricular Rate:  123 PR Interval:  129 QRS Duration: 85 QT Interval:  319 QTC Calculation: 456 R Axis:   69 Text Interpretation:  Sinus tachycardia Probable LVH with secondary repol  abnrm Confirmed by WARD,  DO, KRISTEN (([id number] on 10/10/2014 3:40:28 PM      MDM   Final  diagnoses:  SOB (shortness of breath)  Chest pain   48 year old female status post repair of type B thoracic aneurysm with ulcer last month, presenting with shortness of breath and chest pain. On arrival, patient is very anxious appearing and hyperventilating.  She is tachycardic and tachypnea at present, actively hyperventilating.  Will plans for EKG, labs, portable x-ray and repeat CTA of chest and abdomen to check patency of graft/evaluate for new dissection.  Dose of ativan given.  EKG with sinus tach, no acute ischemic changes.  Trop negative.  CXR clear.  Lab work with mild hypokalemia, SrCr appears baseline for patient when compared with previous.  40 KCl given.  CTA chest/abdomen negative for acute findings (small area of dissection noted in abdominal aorta, unchanged from previous), graft appears patent, no evidence of PE.  Delta trop was obtained which is negative.  Potassium was replaced in the ED.  Patient's VS have stabilized, she is now breathing normally without any distress.  Feel that majority of her symptoms are due to anxiety and she is stable for discharge home.  Case management has met with patient and family-- has arranged [facility]U app[facility] on Monday, patient does have enough medications to last until appt time.  Discussed plan with patient, he/she acknowledged understanding and agreed with plan of care.  Return precautions given for new or worsening symptoms.  Larene Pickett, PA-C 10/10/14 [phone]

## 2014-10-10 NOTE — ED Notes (Signed)
Pt states she began to feel SOB this AM while talking to her daughter on the phone.  Pt states she has a hx of anxiety and has been out of 2 medications that she can't recall for 2 months.  EMS stated she hyperventalated the entire trip on the way to the ER.  Pt presents with tearful, vocally anxious and hyperventalating to the point of becoming out of breath.

## 2014-10-11 ENCOUNTER — Telehealth: Payer: Self-pay | Admitting: *Deleted

## 2014-10-11 NOTE — Care Management (Signed)
  CARE MANAGEMENT ED NOTE 10/11/2014  Patient:  Charlotte [name],Charlotte [name]   Account Number:  [id number]  Date Initiated:  10/11/2014  Documentation initiated by:  [facility],Loriana Samad  Subjective/Objective Assessment:     Subjective/Objective Assessment Detail:   48 y.o. 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 She is also experiencing shortness of breath. Daughter states, that mom is out of medication because she has does not have a PCP visit establsihed. Patient is in a w/c and ha     Action/Plan:   [facility]/ Pharmacy  Medicaid Transportation   Action/Plan Detail:   Anticipated DC Date:  10/10/2014     Status Recommendation to Physician:   Result of Recommendation:  Agreed  Other ED Services  Appropriate Status Consult    DC Planning Services  CM consult  Follow-up appt scheduled  PCP issues    Choice offered to / List presented to:  C-1 Patient          Status of service:  Completed, signed off  ED Comments:   ED Comments Detail:  ED CM in to meet with patient and daughter at bedside. Daughter states, that mom is out of medication because she has does not have a PCP visit establsihed. According to the daughter they did not know mom had Medicaid until today. Patient is in a w/c family does not have a vehicle and needs assistance to appts. [facility].  Patient and  Daughter agreeable to establioshing care. Offered to assist with scheduling  f/u appt to establsih care agreeable to the assistance. Scheduled appt with [facility] for 11/23 at 1230 for establsihing care, and [facility] for [facility]t, explained that someone will contact patient from the office [phone], provided patient with number. Pt and daughter  both verbalized understanding teach pack was done. No further ED CM needs identified.

## 2014-10-14 ENCOUNTER — Inpatient Hospital Stay: Payer: Medicaid Other

## 2014-10-16 ENCOUNTER — Ambulatory Visit: Payer: Medicaid Other | Attending: Internal Medicine | Admitting: Internal Medicine

## 2014-10-16 ENCOUNTER — Encounter: Payer: Self-pay | Admitting: Internal Medicine

## 2014-10-16 VITALS — BP 126/75 | HR 90 | Temp 98.0°F | Resp 16

## 2014-10-16 DIAGNOSIS — E785 Hyperlipidemia, unspecified: Secondary | ICD-10-CM | POA: Diagnosis not present

## 2014-10-16 DIAGNOSIS — M62838 Other muscle spasm: Secondary | ICD-10-CM | POA: Diagnosis not present

## 2014-10-16 DIAGNOSIS — Z8673 Personal history of transient ischemic attack (TIA), and cerebral infarction without residual deficits: Secondary | ICD-10-CM | POA: Insufficient documentation

## 2014-10-16 DIAGNOSIS — F17210 Nicotine dependence, cigarettes, uncomplicated: Secondary | ICD-10-CM | POA: Diagnosis not present

## 2014-10-16 DIAGNOSIS — I71019 Dissection of thoracic aorta, unspecified: Secondary | ICD-10-CM

## 2014-10-16 DIAGNOSIS — K219 Gastro-esophageal reflux disease without esophagitis: Secondary | ICD-10-CM | POA: Diagnosis not present

## 2014-10-16 DIAGNOSIS — N289 Disorder of kidney and ureter, unspecified: Secondary | ICD-10-CM

## 2014-10-16 DIAGNOSIS — F419 Anxiety disorder, unspecified: Secondary | ICD-10-CM

## 2014-10-16 DIAGNOSIS — F319 Bipolar disorder, unspecified: Secondary | ICD-10-CM | POA: Insufficient documentation

## 2014-10-16 DIAGNOSIS — E876 Hypokalemia: Secondary | ICD-10-CM | POA: Diagnosis not present

## 2014-10-16 DIAGNOSIS — I10 Essential (primary) hypertension: Secondary | ICD-10-CM | POA: Diagnosis not present

## 2014-10-16 DIAGNOSIS — Z95828 Presence of other vascular implants and grafts: Secondary | ICD-10-CM | POA: Insufficient documentation

## 2014-10-16 DIAGNOSIS — G9511 Acute infarction of spinal cord (embolic) (nonembolic): Secondary | ICD-10-CM

## 2014-10-16 DIAGNOSIS — Z79899 Other long term (current) drug therapy: Secondary | ICD-10-CM | POA: Insufficient documentation

## 2014-10-16 DIAGNOSIS — I7101 Dissection of thoracic aorta: Secondary | ICD-10-CM

## 2014-10-16 DIAGNOSIS — R110 Nausea: Secondary | ICD-10-CM

## 2014-10-16 DIAGNOSIS — G8220 Paraplegia, unspecified: Secondary | ICD-10-CM

## 2014-10-16 DIAGNOSIS — F1020 Alcohol dependence, uncomplicated: Secondary | ICD-10-CM | POA: Diagnosis not present

## 2014-10-16 MED ORDER — HYDRALAZINE HCL 100 MG PO TABS: 100.0000 mg | ORAL_TABLET | Freq: Two times a day (BID) | ORAL | Status: DC

## 2014-10-16 MED ORDER — TRAMADOL HCL 50 MG PO TABS: 50.0000 mg | ORAL_TABLET | Freq: Four times a day (QID) | ORAL | Status: DC | PRN

## 2014-10-16 MED ORDER — LISINOPRIL-HYDROCHLOROTHIAZIDE 20-12.5 MG PO TABS: 1.0000 | ORAL_TABLET | Freq: Every day | ORAL | Status: DC

## 2014-10-16 MED ORDER — LABETALOL HCL 300 MG PO TABS: 300.0000 mg | ORAL_TABLET | Freq: Two times a day (BID) | ORAL | Status: DC

## 2014-10-16 MED ORDER — MECLIZINE HCL 25 MG PO TABS: 25.0000 mg | ORAL_TABLET | Freq: Three times a day (TID) | ORAL | Status: DC | PRN

## 2014-10-16 MED ORDER — FAMOTIDINE 20 MG PO TABS: 20.0000 mg | ORAL_TABLET | Freq: Two times a day (BID) | ORAL | Status: DC

## 2014-10-16 MED ORDER — ATORVASTATIN CALCIUM 20 MG PO TABS: 20.0000 mg | ORAL_TABLET | Freq: Every day | ORAL | Status: DC

## 2014-10-16 MED ORDER — POTASSIUM CHLORIDE CRYS ER 20 MEQ PO TBCR: 20.0000 meq | EXTENDED_RELEASE_TABLET | Freq: Two times a day (BID) | ORAL | Status: DC

## 2014-10-16 MED ORDER — METHOCARBAMOL 500 MG PO TABS: 500.0000 mg | ORAL_TABLET | Freq: Four times a day (QID) | ORAL | Status: DC | PRN

## 2014-10-16 MED ORDER — DIAPERS & SUPPLIES MISC: Status: DC

## 2014-10-16 MED ORDER — ALPRAZOLAM 0.25 MG PO TABS: 0.2500 mg | ORAL_TABLET | Freq: Three times a day (TID) | ORAL | Status: DC | PRN

## 2014-10-16 MED ORDER — SERTRALINE HCL 100 MG PO TABS: 100.0000 mg | ORAL_TABLET | Freq: Every day | ORAL | Status: DC

## 2014-10-16 NOTE — Progress Notes (Signed)
Patient here for hospital follow up Has a history of stroke Recently had stent put into her heart Patient requesting refills on her medications

## 2014-10-16 NOTE — Progress Notes (Signed)
Patient Demographics  Charlotte [name], is a 48 y.o. female  ONG:[id number]:[id number]  GMW:[id number]  DOB - [birth year]  CC:  Chief Complaint  Patient presents with  . Hospitalization Follow-up       HPI: Charlotte LarssonHelene Delashmit is a 48 y.o. 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 Patient has No headache, No chest pain, No abdominal pain - No Nausea, No new weakness tingling or numbness, No Cough - SOB.  Allergies  Allergen Reactions  . Seroquel [Quetiapine] Other (See Comments)    Night sweats, dizziness and possible confusion  . Olanzapine Nausea And Vomiting   Past Medical History  Diagnosis Date  . Hypertension   . Family history of anesthesia complication     "alot of back pains from the epidurals"  . Complication of anesthesia     "alot of back pains from the epidurals"  . Anemia   . Depression   . GERD (gastroesophageal reflux disease)   . Daily headache   . Migraine     "sometimes twice/day" (07/05/2014)  . Stroke 06/29/2014    residual BLE weakness  . Dissecting aneurysm of thoracic aorta, Stanford type B 06/29/2014    [name]/notes 07/05/2014  . Polycystic kidney disease     /notes 07/05/2014  . 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 Arthritis     "shoulders"  (07/05/2014)  . Bipolar disorder   . Anxiety    Current Outpatient Prescriptions on File Prior to Visit  Medication Sig Dispense Refill  . bisacodyl (DULCOLAX) 10 MG suppository Place 1 suppository (10 mg total) rectally daily at 6 (six) AM. For bowel program. 30 suppository 0  . feeding supplement, ENSURE COMPLETE, (ENSURE COMPLETE) LIQD Take 237 mLs by mouth 3 (three) times daily between meals.    . gabapentin (NEURONTIN) 100 MG capsule Take 2 capsules (200 mg total) by mouth 3 (three) times daily. 180 capsule 1  . polyethylene glycol (MIRALAX / GLYCOLAX) packet Take 17 g by mouth daily after supper. 30 each 0   No current facility-administered medications on file prior to visit.   Family History  Problem Relation Age of Onset  . Stroke Father   . Hypertension Father   . Diabetes type II Other   . Hypertension Mother   . Hypertension Sister   . Diabetes Paternal Grandfather    History   Social History  . Marital Status: Single    Spouse Name: N/A    Number of Children: N/A  . Years of Education: N/A   Occupational History  . Not on file.   Social History Main Topics  . Smoking status: Current Every Day Smoker -- 1.00 packs/day for 37 years    Types: Cigarettes  . Smokeless tobacco: Never Used  . Alcohol Use: 0.0 oz/week    0 Not specified per  week     Comment: 07/05/2014 "recovering alcoholic since 2013"  . Drug Use: No     Comment: 07/05/2014 "recovering cocaine addict since 2013"  . Sexual Activity: Not on file   Other Topics Concern  . Not on file   Social History Narrative    Review of Systems: Constitutional: Negative for fever, chills, diaphoresis, activity change, appetite change and fatigue. HENT: Negative for ear pain, nosebleeds, congestion, facial swelling, rhinorrhea, neck pain, neck stiffness and ear discharge.  Eyes: Negative for pain, discharge, redness, itching and visual disturbance. Respiratory: Negative for cough, choking, chest tightness,  shortness of breath, wheezing and stridor.  Cardiovascular: Negative for chest pain, palpitations and leg swelling. Gastrointestinal: Negative for abdominal distention. Genitourinary: Negative for dysuria, urgency, frequency, hematuria, flank pain, decreased urine volume, difficulty urinating and dyspareunia.  Musculoskeletal: Negative for back pain, joint swelling, arthralgia and gait problem. Neurological: Negative for dizziness, tremors, seizures, syncope, facial asymmetry, speech difficulty, weakness, light-headedness, numbness and headaches.  Hematological: Negative for adenopathy. Does not bruise/bleed easily. Psychiatric/Behavioral: Negative for hallucinations, behavioral problems, confusion, dysphoric mood, decreased concentration and agitation.    Objective:   Filed Vitals:   10/16/14 1539  BP: 126/75  Pulse: 90  Temp: 98 F (36.7 C)  Resp: 16    Physical Exam: Constitutional: Patient is in wheelchair. HENT: Normocephalic, atraumatic, External right and left ear normal. Oropharynx is clear and moist.  Eyes: Conjunctivae and EOM are normal. PERRLA, no scleral icterus. Neck: Normal ROM. Neck supple. No JVD. No tracheal deviation. No thyromegaly. CVS: RRR, S1/S2 +, no murmurs, no gallops, no carotid bruit.  Pulmonary: Effort and breath sounds normal, no stridor, rhonchi, wheezes, rales.  Abdominal: Soft. BS +, no distension, tenderness, rebound or guarding.  Musculoskeletal: Normal range of motion. No edema and no tenderness.bilateral lower extremities 4/5 strength  Skin: Skin is warm and dry. No rash noted. Not diaphoretic. No erythema. No pallor. Psychiatric: Normal mood and affect. Behavior, judgment, thought content normal.  Lab Results  Component Value Date   WBC 4.9 10/10/2014   HGB 14.3 10/10/2014   HCT 42.0 10/10/2014   MCV 82.3 10/10/2014   PLT 266 10/10/2014   Lab Results  Component Value Date   CREATININE 1.40* 10/10/2014   BUN 24* 10/10/2014   NA 143  10/10/2014   K 3.2* 10/10/2014   CL 107 10/10/2014   CO2 25 08/25/2014    Lab Results  Component Value Date   HGBA1C 5.7* 06/30/2014   Lipid Panel     Component Value Date/Time   CHOL 201* 06/30/2014 0255   TRIG 96 06/30/2014 0255   HDL 53 06/30/2014 0255   CHOLHDL 3.8 06/30/2014 0255   VLDL 19 06/30/2014 0255   LDLCALC 129* 06/30/2014 0255       Assessment and plan:   1. Thoracic aortic dissection Status post graft placement, patient follow with cardiacthoracic surgery.  2. Dyslipidemia  - atorvastatin (LIPITOR) 20 MG tablet; Take 1 tablet (20 mg total) by mouth daily at 6 PM.  Dispense: 30 tablet; Refill: 3  3. Anxiety  - ALPRAZolam (XANAX) 0.25 MG tablet; Take 1 tablet (0.25 mg total) by mouth 3 (three) times daily as needed for anxiety.  Dispense: 30 tablet; Refill: 3 - sertraline (ZOLOFT) 100 MG tablet; Take 1 tablet (100 mg total) by mouth daily.  Dispense: 30 tablet; Refill: 2 - Ambulatory referral to Psychiatry  4. Renal insufficiency Will repeat blood chemistry. - COMPLETE METABOLIC PANEL WITH GFR  5. Spinal cord ischemia  causing lower extremity paraparesis  6. Essential hypertension Advised patient for DASH diet, medication refill done. - hydrALAZINE (APRESOLINE) 100 MG tablet; Take 1 tablet (100 mg total) by mouth 2 (two) times daily.  Dispense: 60 tablet; Refill: 3 - lisinopril-hydrochlorothiazide (PRINZIDE,ZESTORETIC) 20-12.5 MG per tablet; Take 1 tablet by mouth daily.  Dispense: 30 tablet; Refill: 2 - labetalol (NORMODYNE) 300 MG tablet; Take 1 tablet (300 mg total) by mouth 2 (two) times daily.  Dispense: 60 tablet; Refill: 2  7. Gastroesophageal reflux disease, esophagitis presence not specified  - famotidine (PEPCID) 20 MG tablet; Take 1 tablet (20 mg total) by mouth 2 (two) times daily.  Dispense: 60 tablet; Refill: 3  8. Nausea  - meclizine (ANTIVERT) 25 MG tablet; Take 1 tablet (25 mg total) by mouth 3 (three) times daily as needed for  nausea.  Dispense: 30 tablet; Refill: 1  9. Hypokalemia Will repeat blood chemistry. - potassium chloride SA (K-DUR,KLOR-CON) 20 MEQ tablet; Take 1 tablet (20 mEq total) by mouth 2 (two) times daily.  Dispense: 60 tablet; Refill: 2 - COMPLETE METABOLIC PANEL WITH GFR  10. Muscle spasm Patient is on Robaxin.      Health Maintenance uptodate with flu shot   Return in about 3 months (around 01/16/2015) for hypertension, anxiety.   Doris CheadleADVANI, Maurico Perrell, MD

## 2014-10-17 LAB — COMPLETE METABOLIC PANEL WITH GFR
ALK PHOS: 85 U/L (ref 39–117)
ALT: 10 U/L (ref 0–35)
AST: 14 U/L (ref 0–37)
Albumin: 4.1 g/dL (ref 3.5–5.2)
BUN: 18 mg/dL (ref 6–23)
CHLORIDE: 105 meq/L (ref 96–112)
CO2: 22 meq/L (ref 19–32)
Calcium: 9.6 mg/dL (ref 8.4–10.5)
Creat: 1.03 mg/dL (ref 0.50–1.10)
GFR, EST AFRICAN AMERICAN: 73 mL/min
GFR, Est Non African American: 64 mL/min
GLUCOSE: 118 mg/dL — AB (ref 70–99)
Potassium: 3.1 mEq/L — ABNORMAL LOW (ref 3.5–5.3)
Sodium: 142 mEq/L (ref 135–145)
Total Bilirubin: 0.4 mg/dL (ref 0.2–1.2)
Total Protein: 7.2 g/dL (ref 6.0–8.3)

## 2014-10-21 ENCOUNTER — Telehealth: Payer: Self-pay | Admitting: *Deleted

## 2014-10-21 NOTE — Telephone Encounter (Signed)
-----   Message from Doris Cheadleeepak Advani, MD sent at 10/21/2014 11:36 AM EST ----- Call and let the patient know that her potassium is low, advise patient to take the potassium supplement 20 meq 2 times a day as prescribed.

## 2014-10-21 NOTE — Telephone Encounter (Signed)
Pt aware of lab results, advise to take potassium as prescribed

## 2014-10-28 ENCOUNTER — Ambulatory Visit: Payer: Medicaid Other | Attending: Internal Medicine

## 2014-10-28 DIAGNOSIS — G839 Paralytic syndrome, unspecified: Secondary | ICD-10-CM | POA: Insufficient documentation

## 2014-10-28 DIAGNOSIS — G8220 Paraplegia, unspecified: Secondary | ICD-10-CM

## 2014-10-28 NOTE — Therapy (Signed)
[facility]patient [facility] [address] [place], [place], [address] Phone: [phone]   Fax:  [phone]  Physical Therapy Evaluation  Patient Details  Name: Charlotte [name] MRN: [id number] Date of Birth: 08/19/1966  Encounter Date: 10/28/2014      PT End of Session - 10/28/14 1817    Visit Number 1   Number of Visits 1   PT Start Time 1030   PT Stop Time 1100   PT Time Calculation (min) 30 min   Activity Tolerance Patient tolerated treatment well      Past Medical History  Diagnosis Date  . Hypertension   . Family history of anesthesia complication     "alot of back pains from the epidurals"  . Complication of anesthesia     "alot of back pains from the epidurals"  . Anemia   . Depression   . GERD (gastroesophageal reflux disease)   . Daily headache   . Migraine     "sometimes twice/day" (07/05/2014)  . Stroke 06/29/2014    residual BLE weakness  . Dissecting aneurysm of thoracic aorta, Stanford type B 06/29/2014    [name]/notes 07/05/2014  . Polycystic kidney disease     /notes 07/05/2014  . 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 Arthritis     "shoulders" (07/05/2014)  . Bipolar disorder   . Anxiety     Past Surgical History  Procedure Laterality Date  . Cesarean section  1986  . Tubal ligation  1986  . Spinal drain placement  06/29/2014  . Spinal drain removal  07/02/2014  . Thoracentesis  07/05/2014    [name]/notes 07/05/2014  . Thoracic aortic endovascular stent graft N/A 08/23/2014    Procedure: ENDOVASCULAR THORACIC AORTIC  STENT GRAFT;  Surgeon: Sherren Kernsharles E Fields, MD;  [facility];  Service: Vascular;  Laterality: N/A;    There were no vitals taken for this visit.  Visit Diagnosis:  Paraparesis - Plan: PT plan of care cert/re-cert      Subjective Assessment - 10/28/14 1038    Symptoms Pt had a stroke June 29, 2014. Pt presented with bilat paraparesis after stroke resulting in inability to walk.  Pt had home health services at daughter's home, but pt demanded to be moved back to her own home. Pt was unsafe to be living alone without care during day, so home health services withdrew due to unsafe in home environment. Despite being told that pt is unsafe in home, she continues to live there.  Daughter is present with other family member ; their goal for treatment today is to progress HEP .  Pt is wheelchair bound and requires assist for all transfers.    Pertinent History CVA, Bilat Paraparesis   Limitations Standing;Walking   How long can you stand comfortably? unable   How long can you walk comfortably? unable   Currently in Pain? Yes   Pain Score 8    Pain Location Leg   Pain Orientation Left   Pain Descriptors / Indicators Aching   Pain Type Acute pain          [facility] PT Assessment - 10/28/14 1339    Assessment   Medical Diagnosis Difficulty with walking, Paraparesis   Onset Date 06/29/14   Prior Therapy HH   Precautions   Precautions Fall   Restrictions   Weight Bearing Restrictions No   Balance Screen   Has the patient fallen in the past 6 months Yes  Home Environment   Living Enviornment Private residence   Living Arrangements Alone;Other (Comment)   Available Help at Discharge --  Family, daughter   Prior Function   Level of Independence Needs assistance with ADLs;Needs assistance with homemaking;Needs assistance with transfers   AROM   Overall AROM  --  L knee flexion contracture   Strength   Overall Strength Deficits  Bilat LE paraparesis (3/5 or less throughout)    Transfers   Transfers Sit to Stand;Stand to Sit;Stand Pivot Transfers   Sit to Stand 3: Mod assist;With upper extremity assist;From toilet;Other/comment   Sit to Stand Details (indicate cue type and reason) x 2 person assist   Stand to Sit 3: Mod assist   Stand to Sit Details x 2 person assist   Stand Pivot Transfers 3: Mod assist   Stand Pivot Transfer Details (indicate cue type and reason) x  2 person assist   Ambulation/Gait   Ambulation/Gait No  non ambulatory   Wheelchair Mobility   Wheelchair Mobility Yes   Wheelchair Assistance 5: Supervision            PT Education - 10/28/14 1813    Education provided Yes   Education Details PT explained to pt and family that pt is not safe to live a home alone, even if it was handicapped accessible, because she requires assist with transfers. PT explained to pt that she would best be cared for in her daughter's house where she could receive HH services. Instructed pt and CGs in daily standing program at sink, but not to practice without asisist. Recommended continue HH HEP.     Person(s) Educated Patient;Child(ren);Caregiver(s)   Methods Explanation;Demonstration;Verbal cues   Comprehension Verbalized understanding;Returned demonstration;Verbal cues required          PT Short Term Goals - 10/28/14 1821    PT SHORT TERM GOAL #1   Title Est a HEP   Time 1   Period Days   Status Achieved            Plan - 10/28/14 1818    Clinical Impression Statement Pt presents with debility, difficulty in walking and with transfers, and paraparesis. Pt was evaluated today for self care education and to progress HEP. Pt is evaluation only due to only one visit approved by Medicaid.    Pt will benefit from skilled therapeutic intervention in order to improve on the following deficits Decreased strength;Pain;Decreased activity tolerance;Decreased endurance;Decreased range of motion;Difficulty walking;Decreased safety awareness   Rehab Potential Fair   PT Frequency 1x / week   PT Duration --  1 week   PT Treatment/Interventions ADLs/Self Care Home Management   PT Next Visit Plan Eval only visit                              Problem List Patient Active Problem List   Diagnosis Date Noted  . Renal insufficiency 10/16/2014  . Essential hypertension 10/16/2014  . Esophageal reflux 10/16/2014  . Muscle spasm  10/16/2014  . Thoracic aortic dissection 08/22/2014  . Other secondary hypertension 08/01/2014  . Anxiety 07/29/2014  . Type 3 dissection of thoracic aorta 07/29/2014  . Chronic renal insufficiency, stage III (moderate) 07/19/2014  . Dyslipidemia 07/19/2014  . 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 Polycystic kidney disease 06/29/2014  . ANEMIA 02/09/2011  . TOBACCO ABUSE 02/09/2011  . COCAINE ABUSE 02/09/2011  . HTN (hypertension), malignant 02/09/2011   Haze Rushing,  PT, DPT 10/28/2014 6:24 PM Phone: [phone] Fax: [phone]

## 2014-11-07 ENCOUNTER — Ambulatory Visit (INDEPENDENT_AMBULATORY_CARE_PROVIDER_SITE_OTHER): Payer: Medicaid Other | Admitting: Cardiology

## 2014-11-07 ENCOUNTER — Telehealth: Payer: Self-pay | Admitting: Internal Medicine

## 2014-11-07 ENCOUNTER — Encounter: Payer: Self-pay | Admitting: Cardiology

## 2014-11-07 VITALS — BP 150/90 | HR 58 | Ht 66.0 in | Wt 126.0 lb

## 2014-11-07 DIAGNOSIS — G9511 Acute infarction of spinal cord (embolic) (nonembolic): Secondary | ICD-10-CM

## 2014-11-07 DIAGNOSIS — G8220 Paraplegia, unspecified: Secondary | ICD-10-CM

## 2014-11-07 DIAGNOSIS — Z720 Tobacco use: Secondary | ICD-10-CM

## 2014-11-07 DIAGNOSIS — I10 Essential (primary) hypertension: Secondary | ICD-10-CM

## 2014-11-07 DIAGNOSIS — I71012 Dissection of descending thoracic aorta: Secondary | ICD-10-CM

## 2014-11-07 DIAGNOSIS — F17200 Nicotine dependence, unspecified, uncomplicated: Secondary | ICD-10-CM

## 2014-11-07 DIAGNOSIS — I7101 Dissection of thoracic aorta: Secondary | ICD-10-CM

## 2014-11-07 MED ORDER — LISINOPRIL-HYDROCHLOROTHIAZIDE 20-12.5 MG PO TABS: 1.0000 | ORAL_TABLET | Freq: Two times a day (BID) | ORAL | Status: DC

## 2014-11-07 NOTE — Telephone Encounter (Signed)
Pt.'s daughter is calling to follow up in regards to paperwork that was dropped off over a week and a half ago...please follow up [phone] Orpah ClintonAshley Claros...[name].[name].this is an urgent matter for patient, this is all that she needs in order to have a nurse available 24/7 at home.[name].[name]..[name]

## 2014-11-07 NOTE — Assessment & Plan Note (Signed)
The patient has cut back on her cigarettes but has not totally quit.  We have asked her to quit altogether in view of her other serious cardiovascular problems

## 2014-11-07 NOTE — Patient Instructions (Signed)
INCREASE YOUR LISINOPRIL HCT TO TWICE A DAY, NEW RX SENT TO [facility]  Your physician recommends that you schedule a follow-up appointment in:  3 MONTH OV/EKG/BMET

## 2014-11-07 NOTE — Assessment & Plan Note (Signed)
Blood pressure is still not optimal.  We will increase her lisinopril HCT to twice a day

## 2014-11-07 NOTE — Assessment & Plan Note (Signed)
She is not having any new chest or back pain.

## 2014-11-07 NOTE — Progress Notes (Signed)
Charlotte [name] Date of Birth:  1966-06-10 [facility] [address] [place], [place]  [address] [phone]        Fax   [phone]   History of Present Illness: History of Present Illness: This pleasant 48 year old [place] woman is seen for a follow-up 3 month office visit. I had seen her on rounds at: Hospital during her admission there. She was admitted in August 2015 with a dissected aortic aneurysm of the thoracic aorta. It was a type B aortic dissection. 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 She was discharged from, hospital on 07/25/14. 4 days later she went to [facility] or chest pain. She ruled out for myocardial infarction. She was discharged on 07/31/14.[name]. She went back to come hospital on 08/02/14 to the emergency room and was evaluated and released. In October she apparently had another hospital admission secondary to tachycardia and anxiety..  She has not had syncope. She is a former smoker. She stopped smoking when she had her aortic dissection and her stroke last month. She has had some atypical left-sided chest discomfort under the left lateral breast. No precordial chest discomfort is noted. She continues to smoke a few cigarettes a day. She is unable to stand unassisted.  She did have a few physical therapy visits at home.  She has a walker but cannot stand unassisted. Current Outpatient Prescriptions  Medication Sig Dispense Refill  . ALPRAZolam (XANAX) 0.25 MG tablet Take 1 tablet (0.25 mg total) by mouth 3 (three) times daily as needed for anxiety. 30 tablet 3  . atorvastatin (LIPITOR) 20 MG tablet Take 1 tablet (20 mg total) by mouth daily at 6 PM. 30 tablet 3  . Diapers & Supplies MISC Use as directed 50 each 12  . famotidine (PEPCID) 20 MG tablet Take 1 tablet (20 mg total) by mouth 2 (two) times daily. 60 tablet 3  . feeding supplement, ENSURE  COMPLETE, (ENSURE COMPLETE) LIQD Take 237 mLs by mouth 3 (three) times daily between meals.    . gabapentin (NEURONTIN) 100 MG capsule Take 2 capsules (200 mg total) by mouth 3 (three) times daily. 180 capsule 1  . hydrALAZINE (APRESOLINE) 100 MG tablet Take 1 tablet (100 mg total) by mouth 2 (two) times daily. 60 tablet 3  . labetalol (NORMODYNE) 300 MG tablet Take 1 tablet (300 mg total) by mouth 2 (two) times daily. 60 tablet 2  . lisinopril-hydrochlorothiazide (PRINZIDE,ZESTORETIC) 20-12.5 MG per tablet Take 1 tablet by mouth 2 (two) times daily. 60 tablet 5  . meclizine (ANTIVERT) 25 MG tablet Take 1 tablet (25 mg total) by mouth 3 (three) times daily as needed for nausea. 30 tablet 1  . methocarbamol (ROBAXIN) 500 MG tablet Take 1 tablet (500 mg total) by mouth every 6 (six) hours as needed for muscle spasms. 60 tablet 2  . Oxycodone HCl 10 MG TABS Take by mouth.    . polyethylene glycol (MIRALAX / GLYCOLAX) packet Take 17 g by mouth daily after supper. 30 each 0  . potassium chloride SA (K-DUR,KLOR-CON) 20 MEQ tablet Take 1 tablet (20 mEq total) by mouth 2 (two) times daily. 60 tablet 2  . sertraline (ZOLOFT) 100 MG tablet Take 1 tablet (100 mg total) by mouth daily. 30 tablet 2  . traMADol (ULTRAM) 50 MG tablet Take 1 tablet (50 mg total) by mouth every 6 (six) hours as needed for moderate pain.  45 tablet 0   No current facility-administered medications for this visit.    Allergies  Allergen Reactions  . Seroquel [Quetiapine] Other (See Comments)    Night sweats, dizziness and possible confusion  . Olanzapine Nausea And Vomiting    Patient Active Problem List   Diagnosis Date Noted  . Renal insufficiency 10/16/2014  . Essential hypertension 10/16/2014  . Esophageal reflux 10/16/2014  . Muscle spasm 10/16/2014  . Thoracic aortic dissection 08/22/2014  . Other secondary hypertension 08/01/2014  . Anxiety 07/29/2014  . Type 3 dissection of thoracic aorta 07/29/2014  . Chronic  renal insufficiency, stage III (moderate) 07/19/2014  . Dyslipidemia 07/19/2014  . 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 Polycystic kidney disease 06/29/2014  . ANEMIA 02/09/2011  . TOBACCO ABUSE 02/09/2011  . COCAINE ABUSE 02/09/2011  . HTN (hypertension), malignant 02/09/2011    History  Smoking status  . Current Every Day Smoker -- 1.00 packs/day for 37 years  . Types: Cigarettes  Smokeless tobacco  . Never Used    History  Alcohol Use  . 0.0 oz/week  . 0 Not specified per week    Comment: 07/05/2014 "recovering alcoholic since 2013"    Family History  Problem Relation Age of Onset  . Stroke Father   . Hypertension Father   . Diabetes type II Other   . Hypertension Mother   . Hypertension Sister   . Diabetes Paternal Grandfather     Review of Systems: Constitutional: no fever chills diaphoresis or fatigue or change in weight.  Head and neck: no hearing loss, no epistaxis, no photophobia or visual disturbance. Respiratory: No cough, shortness of breath or wheezing. Cardiovascular: No chest pain peripheral edema, palpitations. Gastrointestinal: No abdominal distention, no abdominal pain, no change in bowel habits hematochezia or melena. Genitourinary: No dysuria, no frequency, no urgency, no nocturia. Musculoskeletal:No arthralgias, no back pain, no gait disturbance or myalgias. Neurological: No dizziness, no headaches, no numbness, no seizures, no syncope, positive for lower extremity weakness,  Hematologic: No lymphadenopathy, no easy bruising. Psychiatric: No confusion, no hallucinations, no sleep disturbance.   Wt Readings from Last 3 Encounters:  11/07/14 126 lb (57.153 kg)  08/25/14 145 lb 15.1 oz (66.2 kg)  08/07/14 140 lb (63.504 kg)    Physical Exam: Filed Vitals:   11/07/14 1637  BP: 150/90  Pulse: 58  The patient appears to be in no distress.  Head and neck exam reveals that the pupils are equal and reactive.   The extraocular movements are full.  There is no scleral icterus.  Mouth and pharynx are benign.  No lymphadenopathy.  No carotid bruits.  The jugular venous pressure is normal.  Thyroid is not enlarged or tender.  Chest is clear to percussion and auscultation.  No rales or rhonchi.  Expansion of the chest is symmetrical.  Heart reveals no abnormal lift or heave.  First and second heart sounds are normal.  There is no murmur gallop rub or click.  The abdomen is soft and nontender.  Bowel sounds are normoactive.  There is no hepatosplenomegaly or mass.  There are no abdominal bruits.  Extremities reveal no phlebitis or edema.  Pedal pulses are good.  There is no cyanosis or clubbing.  Neurologic exam reveals that she is in a wheelchair.  Gait not tested.  Family indicates she is unable to stand unassisted.  Integument reveals no rash    Assessment / Plan:  1. Status post type B. aortic dissection with resultant  paraparesis of the lower extremities secondary to spinal cord ischemia. 2. hypertensive cardiovascular disease 3. bipolar disorder 4. chronic renal insufficiency Plan: Her systolic blood pressure is still elevated.  We will increase her lisinopril HCT up to twice a day.  Continue other medicines the same.  Stop smoking.  Counseled concerning this.  Recheck in 3 months for office visit EKG and basal metabolic panel

## 2014-11-11 NOTE — Telephone Encounter (Signed)
Pt.'s daughter is calling to follow up in regards to paperwork that was dropped off over a week and a half ago...please follow up [phone] [name] Campise.....this is an urgent matter for patient, this is all that she needs in order to have a nurse available 24/7 at home.... °

## 2014-11-17 ENCOUNTER — Encounter (HOSPITAL_COMMUNITY): Payer: Self-pay | Admitting: *Deleted

## 2014-11-17 ENCOUNTER — Emergency Department (HOSPITAL_COMMUNITY)
Admission: EM | Admit: 2014-11-17 | Discharge: 2014-11-17 | Disposition: A | Discharge status: Home / Self Care | Payer: Medicaid Other | Attending: Emergency Medicine | Admitting: Emergency Medicine

## 2014-11-17 ENCOUNTER — Emergency Department (HOSPITAL_COMMUNITY): Payer: Medicaid Other

## 2014-11-17 DIAGNOSIS — G43909 Migraine, unspecified, not intractable, without status migrainosus: Secondary | ICD-10-CM | POA: Insufficient documentation

## 2014-11-17 DIAGNOSIS — F319 Bipolar disorder, unspecified: Secondary | ICD-10-CM | POA: Insufficient documentation

## 2014-11-17 DIAGNOSIS — F419 Anxiety disorder, unspecified: Secondary | ICD-10-CM | POA: Diagnosis not present

## 2014-11-17 DIAGNOSIS — Z79899 Other long term (current) drug therapy: Secondary | ICD-10-CM | POA: Diagnosis not present

## 2014-11-17 DIAGNOSIS — Z8673 Personal history of transient ischemic attack (TIA), and cerebral infarction without residual deficits: Secondary | ICD-10-CM | POA: Insufficient documentation

## 2014-11-17 DIAGNOSIS — R079 Chest pain, unspecified: Secondary | ICD-10-CM

## 2014-11-17 DIAGNOSIS — Z87718 Personal history of other specified (corrected) congenital malformations of genitourinary system: Secondary | ICD-10-CM | POA: Insufficient documentation

## 2014-11-17 DIAGNOSIS — M1990 Unspecified osteoarthritis, unspecified site: Secondary | ICD-10-CM | POA: Insufficient documentation

## 2014-11-17 DIAGNOSIS — Z862 Personal history of diseases of the blood and blood-forming organs and certain disorders involving the immune mechanism: Secondary | ICD-10-CM | POA: Diagnosis not present

## 2014-11-17 DIAGNOSIS — R531 Weakness: Secondary | ICD-10-CM | POA: Diagnosis not present

## 2014-11-17 DIAGNOSIS — Z720 Tobacco use: Secondary | ICD-10-CM | POA: Insufficient documentation

## 2014-11-17 DIAGNOSIS — R61 Generalized hyperhidrosis: Secondary | ICD-10-CM | POA: Diagnosis not present

## 2014-11-17 DIAGNOSIS — R110 Nausea: Secondary | ICD-10-CM | POA: Insufficient documentation

## 2014-11-17 DIAGNOSIS — I10 Essential (primary) hypertension: Secondary | ICD-10-CM | POA: Insufficient documentation

## 2014-11-17 DIAGNOSIS — R0789 Other chest pain: Secondary | ICD-10-CM | POA: Diagnosis not present

## 2014-11-17 LAB — I-STAT TROPONIN, ED: TROPONIN I, POC: 0.02 ng/mL (ref 0.00–0.08)

## 2014-11-17 LAB — I-STAT CHEM 8, ED
BUN: 34 mg/dL — ABNORMAL HIGH (ref 6–23)
Calcium, Ion: 1.18 mmol/L (ref 1.12–1.23)
Chloride: 107 mEq/L (ref 96–112)
Creatinine, Ser: 1.2 mg/dL — ABNORMAL HIGH (ref 0.50–1.10)
GLUCOSE: 103 mg/dL — AB (ref 70–99)
HCT: 44 % (ref 36.0–46.0)
Hemoglobin: 15 g/dL (ref 12.0–15.0)
POTASSIUM: 3.5 mmol/L (ref 3.5–5.1)
Sodium: 143 mmol/L (ref 135–145)
TCO2: 22 mmol/L (ref 0–100)

## 2014-11-17 LAB — CBC WITH DIFFERENTIAL/PLATELET
Basophils Absolute: 0 10*3/uL (ref 0.0–0.1)
Basophils Relative: 1 % (ref 0–1)
Eosinophils Absolute: 0.1 10*3/uL (ref 0.0–0.7)
Eosinophils Relative: 2 % (ref 0–5)
HCT: 39.6 % (ref 36.0–46.0)
Hemoglobin: 13 g/dL (ref 12.0–15.0)
LYMPHS ABS: 2.5 10*3/uL (ref 0.7–4.0)
Lymphocytes Relative: 39 % (ref 12–46)
MCH: 27.1 pg (ref 26.0–34.0)
MCHC: 32.8 g/dL (ref 30.0–36.0)
MCV: 82.7 fL (ref 78.0–100.0)
Monocytes Absolute: 0.4 10*3/uL (ref 0.1–1.0)
Monocytes Relative: 6 % (ref 3–12)
NEUTROS ABS: 3.4 10*3/uL (ref 1.7–7.7)
NEUTROS PCT: 52 % (ref 43–77)
PLATELETS: 195 10*3/uL (ref 150–400)
RBC: 4.79 MIL/uL (ref 3.87–5.11)
RDW: 16.2 % — ABNORMAL HIGH (ref 11.5–15.5)
WBC: 6.4 10*3/uL (ref 4.0–10.5)

## 2014-11-17 LAB — ETHANOL

## 2014-11-17 MED ORDER — IOHEXOL 350 MG/ML SOLN
100.0000 mL | Freq: Once | INTRAVENOUS | Status: AC | PRN
  Administered 2014-11-17: 100 mL via INTRAVENOUS

## 2014-11-17 MED ORDER — METHOCARBAMOL 500 MG PO TABS
500.0000 mg | ORAL_TABLET | Freq: Once | ORAL | Status: DC
  Filled 2014-11-17: qty 1

## 2014-11-17 NOTE — ED Notes (Addendum)
Upon entering the room to administer patient her medicine, patient begins cursing and yelling at nurse, stating she wants to "get the f out of here." patient refused robaxin, stating that is "not what she asked for." Doctor PIckering made aware that patient does not want to take robaxin that was ordered for her pain. Also aware of patients elevated bp, Dr. Rubin PayorPickering still advised to discharge patient home, patient to take her next dose of bp meds when she gets home.

## 2014-11-17 NOTE — ED Notes (Signed)
Attempt IV start x2 (once in [facility] and once in R[facility]) both with 20g, not able to advance IV, unable to draw all ordered labs.  Explained to pt and family, second RN to attempt

## 2014-11-17 NOTE — ED Provider Notes (Signed)
CSN: [id number]     Arrival date & time 11/17/14  1509 History   First MD Initiated Contact with Patient 11/17/14 1516     Chief Complaint  Patient presents with  . Chest Pain     (Consider location/radiation/quality/duration/timing/severity/associated sxs/prior Treatment) Patient is a 48 y.o. female presenting with chest pain. The history is provided by the patient.  Chest Pain Associated symptoms: diaphoresis, nausea and weakness   Associated symptoms: no back pain and no shortness of breath    patient presents with chest pain. Has a history of type B aortic aneurysm. Recently had endovascular stenting around 2 months ago. She states today she had been drinking and began to think about the holidays and some family members and began to cry. She then developed chest pain diaphoresis and some nausea. Also had some shortness of breath. Patient is unable to quantify much of this. She states the pain is improved now. She has some chronic weakness in her legs from previous ischemia from her aortic dissection. She is still somewhat tearful. She cannot describe what the chest pain was like. She cannot tell me if she has had chest pain since her stenting.   Past Medical History  Diagnosis Date  . Hypertension   . Family history of anesthesia complication     "alot of back pains from the epidurals"  . Complication of anesthesia     "alot of back pains from the epidurals"  . Anemia   . Depression   . GERD (gastroesophageal reflux disease)   . Daily headache   . Migraine     "sometimes twice/day" (07/05/2014)  . Stroke 06/29/2014    residual BLE weakness  . Dissecting aneurysm of thoracic aorta, [place] type B 06/29/2014    [name]/notes 07/05/2014  . Polycystic kidney disease     /notes 07/05/2014  . 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 Arthritis     "shoulders" (07/05/2014)  . Bipolar disorder   . Anxiety    Past Surgical History  Procedure  Laterality Date  . Cesarean section  1986  . Tubal ligation  1986  . Spinal drain placement  06/29/2014  . Spinal drain removal  07/02/2014  . Thoracentesis  07/05/2014    [name]/notes 07/05/2014  . Thoracic aortic endovascular stent graft N/A 08/23/2014    Procedure: ENDOVASCULAR THORACIC AORTIC  STENT GRAFT;  Surgeon: Sherren Kernsharles E Fields, MD;  [facility];  Service: Vascular;  Laterality: N/A;   Family History  Problem Relation Age of Onset  . Stroke Father   . Hypertension Father   . Diabetes type II Other   . Hypertension Mother   . Hypertension Sister   . Diabetes Paternal Grandfather    History  Substance Use Topics  . Smoking status: Current Every Day Smoker -- 1.00 packs/day for 37 years    Types: Cigarettes  . Smokeless tobacco: Never Used  . Alcohol Use: 0.0 oz/week    0 Not specified per week     Comment: 07/05/2014 "recovering alcoholic since 2013"   OB History    No data available     Review of Systems  Unable to perform ROS Constitutional: Positive for diaphoresis.  Respiratory: Negative for shortness of breath.   Cardiovascular: Positive for chest pain.  Gastrointestinal: Positive for nausea.  Genitourinary: Negative for flank pain.  Musculoskeletal: Negative for back pain.  Neurological: Positive for weakness.      Allergies  Seroquel  and Olanzapine  Home Medications   Prior to Admission medications   Medication Sig Start Date End Date Taking? Authorizing Provider  ALPRAZolam (XANAX) 0.25 MG tablet Take 1 tablet (0.25 mg total) by mouth 3 (three) times daily as needed for anxiety. 10/16/14  Yes Doris Cheadle, MD  atorvastatin (LIPITOR) 20 MG tablet Take 1 tablet (20 mg total) by mouth daily at 6 PM. 10/16/14  Yes Doris Cheadle, MD  Diapers & Supplies MISC Use as directed 10/16/14  Yes Doris Cheadle, MD  famotidine (PEPCID) 20 MG tablet Take 1 tablet (20 mg total) by mouth 2 (two) times daily. 10/16/14  Yes Doris Cheadle, MD  feeding supplement, ENSURE  COMPLETE, (ENSURE COMPLETE) LIQD Take 237 mLs by mouth 3 (three) times daily between meals. 07/08/14  Yes Christiane Ha, MD  gabapentin (NEURONTIN) 100 MG capsule Take 2 capsules (200 mg total) by mouth 3 (three) times daily. 08/26/14  Yes Wayne E Gold, PA-C  hydrALAZINE (APRESOLINE) 100 MG tablet Take 1 tablet (100 mg total) by mouth 2 (two) times daily. 10/16/14  Yes Doris Cheadle, MD  labetalol (NORMODYNE) 300 MG tablet Take 1 tablet (300 mg total) by mouth 2 (two) times daily. 10/16/14  Yes Doris Cheadle, MD  lisinopril-hydrochlorothiazide (PRINZIDE,ZESTORETIC) 20-12.5 MG per tablet Take 1 tablet by mouth 2 (two) times daily. 11/07/14  Yes Cassell Clement, MD  meclizine (ANTIVERT) 25 MG tablet Take 1 tablet (25 mg total) by mouth 3 (three) times daily as needed for nausea. 10/16/14  Yes Doris Cheadle, MD  methocarbamol (ROBAXIN) 500 MG tablet Take 1 tablet (500 mg total) by mouth every 6 (six) hours as needed for muscle spasms. 10/16/14  Yes Doris Cheadle, MD  Oxycodone HCl 10 MG TABS Take 10 mg by mouth daily.    Yes Historical Provider, MD  polyethylene glycol (MIRALAX / GLYCOLAX) packet Take 17 g by mouth daily after supper. 07/25/14  Yes Evlyn Kanner Love, PA-C  potassium chloride SA (K-DUR,KLOR-CON) 20 MEQ tablet Take 1 tablet (20 mEq total) by mouth 2 (two) times daily. 10/16/14  Yes Doris Cheadle, MD  sertraline (ZOLOFT) 100 MG tablet Take 1 tablet (100 mg total) by mouth daily. 10/16/14  Yes Doris Cheadle, MD  traMADol (ULTRAM) 50 MG tablet Take 1 tablet (50 mg total) by mouth every 6 (six) hours as needed for moderate pain. 10/16/14  Yes Deepak Advani, MD   BP 186/88 mmHg  Pulse 67  Temp(Src) 97.6 F (36.4 C) (Oral)  Resp 15  SpO2 98% Physical Exam  Constitutional: [name], place, and time. She appears well-developed and well-nourished.  HENT:  Head: Normocephalic and atraumatic.  Eyes: EOM are normal. Pupils are equal, round, and reactive to light.  Neck: Normal  range of motion. Neck supple.  Cardiovascular: Normal rate, regular rhythm and normal heart sounds.   No murmur heard. Pulmonary/Chest: Effort normal and breath sounds normal. No respiratory distress. She has no wheezes. She has no rales.  Abdominal: Soft. Bowel sounds are normal. She exhibits no distension. There is no tenderness. There is no rebound and no guarding.  Musculoskeletal: Normal range of motion.  Neurological: She is alert and oriented to person, place, and time. No cranial nerve deficit.  Some wasting of both lower extremities.  Skin: Skin is warm and dry.  Psychiatric: Her speech is normal.  Patient is tearful.  Nursing note and vitals reviewed.   ED Course  Procedures (including critical care time) Labs Review Labs Reviewed  CBC WITH DIFFERENTIAL -  Abnormal; Notable for the following:    RDW 16.2 (*)    All other components within normal limits  I-STAT CHEM 8, ED - Abnormal; Notable for the following:    BUN 34 (*)    Creatinine, Ser 1.20 (*)    Glucose, Bld 103 (*)    All other components within normal limits  ETHANOL  I-STAT TROPOININ, ED    Imaging Review Ct Angio Chest Aorta W/cm &/or Wo/cm  11/17/2014   CLINICAL DATA:  Acute chest pain, hypertension, diaphoresis shortness of breath earlier today.  EXAM: CT ANGIOGRAPHY CHEST, ABDOMEN AND PELVIS  TECHNIQUE: Multidetector CT imaging through the chest, abdomen and pelvis was performed using the standard protocol during bolus administration of intravenous contrast. Multiplanar reconstructed images and MIPs were obtained and reviewed to evaluate the vascular anatomy.  CONTRAST:  OMNIPAQUE IOHEXOL 350 MG/ML SOLN  COMPARISON:  10/10/2014  FINDINGS: CTA CHEST FINDINGS  Patient is status post thoracic aortic stent graft of the descending thoracic aorta. No evidence of endoleak. Stent graft appears patent. Thoracic aorta demonstrates no acute process including dissection or aneurysm. Major branch vessels are patent.  No mediastinal hemorrhage or hematoma. No pericardial or pleural effusion. Visualized pulmonary arteries appear patent without significant central pulmonary embolus. Normal heart size.  Lung windows demonstrate a No focal airspace process, collapse or consolidation. Minor basilar atelectasis. No interstitial process or edema. Negative for pneumothorax.  No acute osseous finding.  Review of the MIP images confirms the above findings.  CTA ABDOMEN AND PELVIS FINDINGS  Intact abdominal aorta. Mild atherosclerotic change. Negative for aneurysm, dissection, retroperitoneal hemorrhage or acute vascular process. 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 SMA and IMA are widely patent. Single main renal arteries are patent bilaterally.  Common, internal, and external iliac arteries demonstrate atherosclerosis but no aneurysm, dissection or acute vascular process. Visualized common femoral, proximal profunda femoral, and proximal superficial femoral arteries demonstrated are all patent without acute finding.  Nonvascular: Scattered numerous varying sized hepatic cysts as before. No biliary dilatation. Gallbladder, biliary system, pancreas, spleen, and adrenal glands are within normal limits for age and demonstrate no acute process by arterial phase imaging. Kidneys are enlarged with extensive polycystic disease, grossly unchanged. No renal obstruction or hydronephrosis.  No abdominal or pelvic free fluid, fluid collection, hemorrhage, hematoma, abscess or adenopathy.  Negative for bowel obstruction, dilatation, ileus, or free air. Exam of the bowel is limited without oral contrast for a CTA exam.  Trace pelvic free fluid, likely physiologic. Uterus and adnexal normal in size.  No acute osseous finding.  Review of the MIP images confirms the above findings.  IMPRESSION: Stable appearance of the thoracic aorta following descending thoracic aortic stent graft repair of a type B dissection.   No acute aortic finding or abnormality.  Polycystic kidneys disease with associated diffuse hepatic cysts  No acute intrathoracic, abdominal or pelvic finding   Electronically Signed   By: Ruel Favors M.D.   On: 11/17/2014 18:25   Ct Cta Abd/pel W/cm &/or W/o Cm  11/17/2014   CLINICAL DATA:  Acute chest pain, hypertension, diaphoresis shortness of breath earlier today.  EXAM: CT ANGIOGRAPHY CHEST, ABDOMEN AND PELVIS  TECHNIQUE: Multidetector CT imaging through the chest, abdomen and pelvis was performed using the standard protocol during bolus administration of intravenous contrast. Multiplanar reconstructed images and MIPs were obtained and reviewed to evaluate the vascular anatomy.  CONTRAST:  OMNIPAQUE IOHEXOL 350 MG/ML SOLN  COMPARISON:  10/10/2014  FINDINGS: CTA CHEST FINDINGS  Patient is status post thoracic aortic stent graft of the descending thoracic aorta. No evidence of endoleak. Stent graft appears patent. Thoracic aorta demonstrates no acute process including dissection or aneurysm. Major branch vessels are patent. No mediastinal hemorrhage or hematoma. No pericardial or pleural effusion. Visualized pulmonary arteries appear patent without significant central pulmonary embolus. Normal heart size.  Lung windows demonstrate a No focal airspace process, collapse or consolidation. Minor basilar atelectasis. No interstitial process or edema. Negative for pneumothorax.  No acute osseous finding.  Review of the MIP images confirms the above findings.  CTA ABDOMEN AND PELVIS FINDINGS  Intact abdominal aorta. Mild atherosclerotic change. Negative for aneurysm, dissection, retroperitoneal hemorrhage or acute vascular process. 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 SMA and IMA are widely patent. Single main renal arteries are patent bilaterally.  Common, internal, and external iliac arteries demonstrate atherosclerosis but no aneurysm, dissection or acute  vascular process. Visualized common femoral, proximal profunda femoral, and proximal superficial femoral arteries demonstrated are all patent without acute finding.  Nonvascular: Scattered numerous varying sized hepatic cysts as before. No biliary dilatation. Gallbladder, biliary system, pancreas, spleen, and adrenal glands are within normal limits for age and demonstrate no acute process by arterial phase imaging. Kidneys are enlarged with extensive polycystic disease, grossly unchanged. No renal obstruction or hydronephrosis.  No abdominal or pelvic free fluid, fluid collection, hemorrhage, hematoma, abscess or adenopathy.  Negative for bowel obstruction, dilatation, ileus, or free air. Exam of the bowel is limited without oral contrast for a CTA exam.  Trace pelvic free fluid, likely physiologic. Uterus and adnexal normal in size.  No acute osseous finding.  Review of the MIP images confirms the above findings.  IMPRESSION: Stable appearance of the thoracic aorta following descending thoracic aortic stent graft repair of a type B dissection.  No acute aortic finding or abnormality.  Polycystic kidneys disease with associated diffuse hepatic cysts  No acute intrathoracic, abdominal or pelvic finding   Electronically Signed   By: Ruel Favorsrevor  Shick M.D.   On: 11/17/2014 18:25     EKG Interpretation   Date/Time:  Sunday November 17 2014 15:24:12 EST Ventricular Rate:  74 PR Interval:  126 QRS Duration: 78 QT Interval:  454 QTC Calculation: 504 R Axis:   65 Text Interpretation:  Sinus rhythm Probable LVH with secondary repol abnrm  Prolonged QT interval HEART RATE DECREASED SINCE Since last tracing  Confirmed by Rubin PayorPICKERING  MD, Harrold DonathNATHAN [phone] on 11/17/2014 3:31:48 PM      MDM   Final diagnoses:  Chest pain     patient with chest pain began with agitation. History of aortic dissection with repair. CT reassuring  Postrepair. EKG reassuring. Doubt coronary artery disease. Will discharge  home.    Juliet RudeNathan R. Rubin PayorPickering, MD 11/18/14 [phone]

## 2014-11-17 NOTE — ED Notes (Signed)
Pt has had a little alcohol today, was having lunch and talking about lost loved ones when she became tearful, she was crying and upset, she then developed chest pain and diaphoresis and some sob.  EKG WNL for ems, once pt calmed down the pain and other symptoms subsided.  Recent hx of MI and CVA in 08/2014, pt is not ambulatory since stroke

## 2014-11-17 NOTE — ED Notes (Signed)
Patient transported to CT 

## 2014-11-17 NOTE — Discharge Instructions (Signed)

## 2014-11-20 ENCOUNTER — Telehealth: Payer: Self-pay | Admitting: Internal Medicine

## 2014-11-20 NOTE — Telephone Encounter (Signed)
Forms were faxed over from [facility] for supplies ex. Diapers about 2 days ago.    And another form from housing authority was faxed since she is trying to move into a handicap able apartment complex. Was faxed attention to denise in nov.   Please follow up with pt. As patient is having difficulties using the bathroom.

## 2014-11-25 NOTE — Telephone Encounter (Signed)
Pt's daughter calling to confirm whether orders to [facility] have been faxed back to [facility].

## 2014-11-26 NOTE — Telephone Encounter (Signed)
Expand All Collapse All   Pt's daughter calling to [facility]            Dr. Orpah CobbAdvani, [facility], handicap/housing authority forms Please f/u

## 2015-01-16 ENCOUNTER — Inpatient Hospital Stay (HOSPITAL_COMMUNITY): Payer: Medicaid Other

## 2015-01-16 ENCOUNTER — Emergency Department (HOSPITAL_COMMUNITY): Payer: Medicaid Other

## 2015-01-16 ENCOUNTER — Inpatient Hospital Stay (HOSPITAL_COMMUNITY)
Admission: EM | Admit: 2015-01-16 | Discharge: 2015-01-20 | DRG: 303 | Disposition: A | Discharge status: Home / Self Care | Payer: Medicaid Other | Attending: Internal Medicine | Admitting: Internal Medicine

## 2015-01-16 ENCOUNTER — Encounter (HOSPITAL_COMMUNITY): Payer: Self-pay | Admitting: Emergency Medicine

## 2015-01-16 DIAGNOSIS — Z8673 Personal history of transient ischemic attack (TIA), and cerebral infarction without residual deficits: Secondary | ICD-10-CM

## 2015-01-16 DIAGNOSIS — Z8679 Personal history of other diseases of the circulatory system: Secondary | ICD-10-CM

## 2015-01-16 DIAGNOSIS — I25110 Atherosclerotic heart disease of native coronary artery with unstable angina pectoris: Secondary | ICD-10-CM | POA: Diagnosis present

## 2015-01-16 DIAGNOSIS — Z8249 Family history of ischemic heart disease and other diseases of the circulatory system: Secondary | ICD-10-CM | POA: Diagnosis not present

## 2015-01-16 DIAGNOSIS — Z7982 Long term (current) use of aspirin: Secondary | ICD-10-CM

## 2015-01-16 DIAGNOSIS — Z720 Tobacco use: Secondary | ICD-10-CM

## 2015-01-16 DIAGNOSIS — Z9889 Other specified postprocedural states: Secondary | ICD-10-CM | POA: Diagnosis not present

## 2015-01-16 DIAGNOSIS — E860 Dehydration: Secondary | ICD-10-CM | POA: Diagnosis present

## 2015-01-16 DIAGNOSIS — N183 Chronic kidney disease, stage 3 unspecified: Secondary | ICD-10-CM | POA: Diagnosis present

## 2015-01-16 DIAGNOSIS — Z993 Dependence on wheelchair: Secondary | ICD-10-CM | POA: Diagnosis not present

## 2015-01-16 DIAGNOSIS — Z823 Family history of stroke: Secondary | ICD-10-CM

## 2015-01-16 DIAGNOSIS — D649 Anemia, unspecified: Secondary | ICD-10-CM | POA: Diagnosis present

## 2015-01-16 DIAGNOSIS — F17210 Nicotine dependence, cigarettes, uncomplicated: Secondary | ICD-10-CM | POA: Diagnosis present

## 2015-01-16 DIAGNOSIS — F319 Bipolar disorder, unspecified: Secondary | ICD-10-CM | POA: Diagnosis present

## 2015-01-16 DIAGNOSIS — N179 Acute kidney failure, unspecified: Secondary | ICD-10-CM | POA: Diagnosis present

## 2015-01-16 DIAGNOSIS — N189 Chronic kidney disease, unspecified: Secondary | ICD-10-CM

## 2015-01-16 DIAGNOSIS — I200 Unstable angina: Secondary | ICD-10-CM | POA: Diagnosis present

## 2015-01-16 DIAGNOSIS — G9511 Acute infarction of spinal cord (embolic) (nonembolic): Secondary | ICD-10-CM

## 2015-01-16 DIAGNOSIS — Q613 Polycystic kidney, unspecified: Secondary | ICD-10-CM

## 2015-01-16 DIAGNOSIS — G8220 Paraplegia, unspecified: Secondary | ICD-10-CM | POA: Diagnosis present

## 2015-01-16 DIAGNOSIS — I10 Essential (primary) hypertension: Secondary | ICD-10-CM

## 2015-01-16 DIAGNOSIS — K219 Gastro-esophageal reflux disease without esophagitis: Secondary | ICD-10-CM | POA: Diagnosis present

## 2015-01-16 DIAGNOSIS — I129 Hypertensive chronic kidney disease with stage 1 through stage 4 chronic kidney disease, or unspecified chronic kidney disease: Secondary | ICD-10-CM | POA: Diagnosis present

## 2015-01-16 DIAGNOSIS — R072 Precordial pain: Secondary | ICD-10-CM

## 2015-01-16 DIAGNOSIS — I4581 Long QT syndrome: Secondary | ICD-10-CM | POA: Diagnosis present

## 2015-01-16 DIAGNOSIS — F419 Anxiety disorder, unspecified: Secondary | ICD-10-CM | POA: Diagnosis present

## 2015-01-16 DIAGNOSIS — R079 Chest pain, unspecified: Secondary | ICD-10-CM | POA: Diagnosis present

## 2015-01-16 DIAGNOSIS — F17200 Nicotine dependence, unspecified, uncomplicated: Secondary | ICD-10-CM | POA: Diagnosis present

## 2015-01-16 DIAGNOSIS — I7100 Dissection of unspecified site of aorta: Secondary | ICD-10-CM

## 2015-01-16 HISTORY — DX: Other chronic pain: G89.29

## 2015-01-16 HISTORY — DX: Disorder of kidney and ureter, unspecified: N28.9

## 2015-01-16 HISTORY — DX: Low back pain: M54.5

## 2015-01-16 LAB — BASIC METABOLIC PANEL
Anion gap: 7 (ref 5–15)
BUN: 15 mg/dL (ref 6–23)
CHLORIDE: 108 mmol/L (ref 96–112)
CO2: 24 mmol/L (ref 19–32)
Calcium: 9.4 mg/dL (ref 8.4–10.5)
Creatinine, Ser: 1.41 mg/dL — ABNORMAL HIGH (ref 0.50–1.10)
GFR calc non Af Amer: 43 mL/min — ABNORMAL LOW (ref >90)
GFR, EST AFRICAN AMERICAN: 49 mL/min — AB (ref >90)
Glucose, Bld: 119 mg/dL — ABNORMAL HIGH (ref 70–99)
POTASSIUM: 3.6 mmol/L (ref 3.5–5.1)
SODIUM: 139 mmol/L (ref 135–145)

## 2015-01-16 LAB — CBC
HEMATOCRIT: 40.1 % (ref 36.0–46.0)
HEMOGLOBIN: 13.4 g/dL (ref 12.0–15.0)
MCH: 28.4 pg (ref 26.0–34.0)
MCHC: 33.4 g/dL (ref 30.0–36.0)
MCV: 85 fL (ref 78.0–100.0)
Platelets: 208 10*3/uL (ref 150–400)
RBC: 4.72 MIL/uL (ref 3.87–5.11)
RDW: 15.1 % (ref 11.5–15.5)
WBC: 7.5 10*3/uL (ref 4.0–10.5)

## 2015-01-16 LAB — TROPONIN I

## 2015-01-16 LAB — MRSA PCR SCREENING: MRSA by PCR: NEGATIVE

## 2015-01-16 LAB — BRAIN NATRIURETIC PEPTIDE: B NATRIURETIC PEPTIDE 5: 69.4 pg/mL (ref 0.0–100.0)

## 2015-01-16 LAB — I-STAT TROPONIN, ED: Troponin i, poc: 0 ng/mL (ref 0.00–0.08)

## 2015-01-16 LAB — MAGNESIUM: MAGNESIUM: 2 mg/dL (ref 1.5–2.5)

## 2015-01-16 MED ORDER — OXYCODONE-ACETAMINOPHEN 5-325 MG PO TABS
2.0000 | ORAL_TABLET | Freq: Once | ORAL | Status: AC
  Administered 2015-01-16: 2 via ORAL
  Filled 2015-01-16: qty 2

## 2015-01-16 MED ORDER — IOHEXOL 350 MG/ML SOLN
100.0000 mL | Freq: Once | INTRAVENOUS | Status: AC | PRN
  Administered 2015-01-16: 100 mL via INTRAVENOUS

## 2015-01-16 MED ORDER — TRAMADOL HCL 50 MG PO TABS
50.0000 mg | ORAL_TABLET | Freq: Four times a day (QID) | ORAL | Status: DC | PRN
  Administered 2015-01-16 – 2015-01-20 (×5): 50 mg via ORAL
  Filled 2015-01-16 (×5): qty 1

## 2015-01-16 MED ORDER — METHOCARBAMOL 500 MG PO TABS
500.0000 mg | ORAL_TABLET | Freq: Four times a day (QID) | ORAL | Status: DC | PRN
Start: 2015-01-16 — End: 2015-01-20
  Administered 2015-01-18: 500 mg via ORAL
  Filled 2015-01-16: qty 1

## 2015-01-16 MED ORDER — ATORVASTATIN CALCIUM 20 MG PO TABS
20.0000 mg | ORAL_TABLET | Freq: Every day | ORAL | Status: DC
  Administered 2015-01-16 – 2015-01-19 (×4): 20 mg via ORAL
  Filled 2015-01-16 (×4): qty 1

## 2015-01-16 MED ORDER — METHOCARBAMOL 500 MG PO TABS
500.0000 mg | ORAL_TABLET | Freq: Once | ORAL | Status: AC
  Administered 2015-01-16: 500 mg via ORAL
  Filled 2015-01-16: qty 1

## 2015-01-16 MED ORDER — SODIUM CHLORIDE 0.9 % IJ SOLN
3.0000 mL | Freq: Two times a day (BID) | INTRAMUSCULAR | Status: DC
  Administered 2015-01-16 – 2015-01-18 (×2): 3 mL via INTRAVENOUS

## 2015-01-16 MED ORDER — POTASSIUM CHLORIDE CRYS ER 20 MEQ PO TBCR
20.0000 meq | EXTENDED_RELEASE_TABLET | Freq: Two times a day (BID) | ORAL | Status: DC
  Administered 2015-01-16 – 2015-01-17 (×3): 20 meq via ORAL
  Filled 2015-01-16 (×3): qty 1

## 2015-01-16 MED ORDER — LISINOPRIL-HYDROCHLOROTHIAZIDE 20-12.5 MG PO TABS: 1.0000 | ORAL_TABLET | Freq: Two times a day (BID) | ORAL | Status: DC

## 2015-01-16 MED ORDER — POLYETHYLENE GLYCOL 3350 17 G PO PACK
17.0000 g | PACK | Freq: Every day | ORAL | Status: DC
  Administered 2015-01-16 – 2015-01-19 (×4): 17 g via ORAL
  Filled 2015-01-16 (×4): qty 1

## 2015-01-16 MED ORDER — ASPIRIN 81 MG PO CHEW
81.0000 mg | CHEWABLE_TABLET | Freq: Every day | ORAL | Status: DC
  Administered 2015-01-17 – 2015-01-18 (×2): 81 mg via ORAL
  Filled 2015-01-16 (×2): qty 1

## 2015-01-16 MED ORDER — ACETAMINOPHEN 325 MG PO TABS: 650.0000 mg | ORAL_TABLET | ORAL | Status: DC | PRN

## 2015-01-16 MED ORDER — NITROGLYCERIN 2 % TD OINT
1.0000 [in_us] | TOPICAL_OINTMENT | Freq: Once | TRANSDERMAL | Status: AC
  Administered 2015-01-16: 1 [in_us] via TOPICAL
  Filled 2015-01-16: qty 1

## 2015-01-16 MED ORDER — GABAPENTIN 100 MG PO CAPS
200.0000 mg | ORAL_CAPSULE | Freq: Three times a day (TID) | ORAL | Status: DC
  Administered 2015-01-16 – 2015-01-17 (×4): 200 mg via ORAL
  Filled 2015-01-16 (×5): qty 2

## 2015-01-16 MED ORDER — SODIUM CHLORIDE 0.9 % IJ SOLN
3.0000 mL | INTRAMUSCULAR | Status: DC | PRN
  Administered 2015-01-17: 3 mL via INTRAVENOUS
  Filled 2015-01-16: qty 3

## 2015-01-16 MED ORDER — ENSURE COMPLETE PO LIQD
237.0000 mL | Freq: Three times a day (TID) | ORAL | Status: DC
  Administered 2015-01-16 – 2015-01-20 (×11): 237 mL via ORAL

## 2015-01-16 MED ORDER — FAMOTIDINE 20 MG PO TABS
20.0000 mg | ORAL_TABLET | Freq: Two times a day (BID) | ORAL | Status: DC
  Administered 2015-01-16 – 2015-01-20 (×8): 20 mg via ORAL
  Filled 2015-01-16 (×8): qty 1

## 2015-01-16 MED ORDER — ONDANSETRON HCL 4 MG/2ML IJ SOLN
4.0000 mg | Freq: Three times a day (TID) | INTRAMUSCULAR | Status: AC | PRN
Start: 2015-01-16 — End: 2015-01-17

## 2015-01-16 MED ORDER — HEPARIN (PORCINE) IN NACL 100-0.45 UNIT/ML-% IJ SOLN
700.0000 [IU]/h | INTRAMUSCULAR | Status: DC
  Filled 2015-01-16: qty 250

## 2015-01-16 MED ORDER — LABETALOL HCL 100 MG PO TABS
300.0000 mg | ORAL_TABLET | Freq: Two times a day (BID) | ORAL | Status: DC
  Administered 2015-01-17 – 2015-01-20 (×6): 300 mg via ORAL
  Filled 2015-01-16 (×14): qty 1

## 2015-01-16 MED ORDER — LISINOPRIL 20 MG PO TABS
20.0000 mg | ORAL_TABLET | Freq: Every day | ORAL | Status: DC
  Administered 2015-01-17: 20 mg via ORAL
  Filled 2015-01-16: qty 1

## 2015-01-16 MED ORDER — HEPARIN BOLUS VIA INFUSION
3000.0000 [IU] | Freq: Once | INTRAVENOUS | Status: DC
  Filled 2015-01-16: qty 3000

## 2015-01-16 MED ORDER — SODIUM CHLORIDE 0.9 % IV SOLN
250.0000 mL | INTRAVENOUS | Status: DC | PRN
  Administered 2015-01-16: 250 mL via INTRAVENOUS

## 2015-01-16 MED ORDER — HYDROCHLOROTHIAZIDE 12.5 MG PO CAPS
12.5000 mg | ORAL_CAPSULE | Freq: Every day | ORAL | Status: DC
  Administered 2015-01-17 – 2015-01-18 (×2): 12.5 mg via ORAL
  Filled 2015-01-16 (×2): qty 1

## 2015-01-16 MED ORDER — SERTRALINE HCL 100 MG PO TABS
100.0000 mg | ORAL_TABLET | Freq: Every day | ORAL | Status: DC
  Administered 2015-01-16 – 2015-01-20 (×5): 100 mg via ORAL
  Filled 2015-01-16 (×5): qty 1

## 2015-01-16 MED ORDER — NITROGLYCERIN 2 % TD OINT
1.0000 [in_us] | TOPICAL_OINTMENT | Freq: Three times a day (TID) | TRANSDERMAL | Status: DC
  Administered 2015-01-16 – 2015-01-18 (×5): 1 [in_us] via TOPICAL
  Filled 2015-01-16: qty 30

## 2015-01-16 MED ORDER — OXYCODONE HCL 5 MG PO TABS
10.0000 mg | ORAL_TABLET | Freq: Every day | ORAL | Status: DC
  Administered 2015-01-16 – 2015-01-20 (×5): 10 mg via ORAL
  Filled 2015-01-16 (×6): qty 2

## 2015-01-16 MED ORDER — HEPARIN (PORCINE) IN NACL 100-0.45 UNIT/ML-% IJ SOLN
750.0000 [IU]/h | INTRAMUSCULAR | Status: DC
  Administered 2015-01-16: 700 [IU]/h via INTRAVENOUS
  Filled 2015-01-16: qty 250

## 2015-01-16 MED ORDER — HYDRALAZINE HCL 50 MG PO TABS
100.0000 mg | ORAL_TABLET | Freq: Two times a day (BID) | ORAL | Status: DC
  Administered 2015-01-16 – 2015-01-18 (×4): 100 mg via ORAL
  Filled 2015-01-16 (×8): qty 2

## 2015-01-16 MED ORDER — ONDANSETRON HCL 4 MG/2ML IJ SOLN: 4.0000 mg | Freq: Four times a day (QID) | INTRAMUSCULAR | Status: DC | PRN

## 2015-01-16 MED ORDER — ALPRAZOLAM 0.25 MG PO TABS: 0.2500 mg | ORAL_TABLET | Freq: Three times a day (TID) | ORAL | Status: DC | PRN

## 2015-01-16 MED ORDER — NICOTINE 14 MG/24HR TD PT24
14.0000 mg | MEDICATED_PATCH | Freq: Every day | TRANSDERMAL | Status: DC
  Administered 2015-01-16 – 2015-01-20 (×5): 14 mg via TRANSDERMAL
  Filled 2015-01-16 (×5): qty 1

## 2015-01-16 MED ORDER — HEPARIN BOLUS VIA INFUSION
3000.0000 [IU] | Freq: Once | INTRAVENOUS | Status: AC
  Administered 2015-01-16: 3000 [IU] via INTRAVENOUS
  Filled 2015-01-16: qty 3000

## 2015-01-16 MED ORDER — REGADENOSON 0.4 MG/5ML IV SOLN
0.4000 mg | Freq: Once | INTRAVENOUS | Status: AC
  Administered 2015-01-17: 0.4 mg via INTRAVENOUS
  Filled 2015-01-16: qty 5

## 2015-01-16 NOTE — Progress Notes (Signed)
Spoke with Charlotte LeatherwoodKatherine at [facility]. Pts BP at 151/85 and HR at 59. Gave Hydralazine but held 300mg  Normodyne for low HR and informed provider of action

## 2015-01-16 NOTE — H&P (Addendum)
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 Following discharge from the hospital she has had 2 ED visits in November and December 2015 for chest pain, shortness of breath, hyperventilation and anxiety and ruled out for aortic dissection with CT angiogram. Patient also has history of stroke with lower extremity weakness, chronic kidney disease stage II, ongoing tobacco use, hypertension, arthritis, bipolar disorder.  At baseline patient is wheelchair bound since having paraparesis last year. This morning when she woke up she experienced substernal chest pressure which was squeezing in nature, 10/10 in severity , nonradiating and without any aggravating or relieving factors.  She denies any headache, blurred vision, back pain, shortness of breath, diaphoresis, palpitations . Reports current symptoms to be different from previous visits for chest pain symptoms.  Patient denies  dizziness, fever, chills, nausea , vomiting,  abdominal pain, bowel or urinary symptoms. Denies change in weight or appetite. Patient continues to smoke half a pack of cigarettes per day. She reports that she has not taken most of her medication for the last 1 week. (Especially her pain medications).  Course in the ED Patient's vitals were stable. Blood will done showed normal CBC, hemoglobin of 13.4, hematocrit of 40, platelets of 208, sodium  of 139, K of 3.6, chloride 100, CO2 24, BUN of 15 and creatinine 1.4, glucose 119. Chest x-ray was unremarkable. EKG showed sinus rhythm at 85 with prolonged QTC of 558, no ST-T changes.  Patient had received aspirin and sublingual nitroglycerin given by EMS and that what her pain down to 8/10. Hospital admission requested.    Review of Systems:  Constitutional: Denies fever, chills, diaphoresis, appetite change and fatigue.  HEENT: Denies photophobia, eye pain, redness, hearing loss, ear pain, congestion, sore throat,  mouth sores, trouble swallowing, neck pain, Respiratory: Chest tightness, Denies SOB, DOE, cough,  and wheezing.   Cardiovascular: chest pain+, denies palpitations and leg swelling.  Gastrointestinal: Denies nausea, vomiting, abdominal pain, diarrhea, constipation, blood in stool and abdominal distention.  Genitourinary: Denies dysuria, hematuria, flank pain and difficulty urinating.  Endocrine: Denies: hot or cold intolerance,  polyuria, polydipsia. Musculoskeletal: chr back pain, denies myalgias,  joint pain or swelling,  Skin: Denies pallor, rash and wound.  Neurological: Denies dizziness, seizures, syncope, weakness, light-headedness, numbness and headaches.  Hematological: Denies adenopathy.  Psychiatric/Behavioral: Denies  confusion, nervousness, sleep disturbance and agitation   Past Medical History  Diagnosis Date  . Hypertension   . Family history of anesthesia complication     "alot of back pains from the epidurals"  . Complication of anesthesia     "alot of back pains from the epidurals"  . Anemia   . Depression   . GERD (gastroesophageal reflux disease)   . Daily headache   . Migraine     "sometimes twice/day" (07/05/2014)  . Stroke 06/29/2014    residual BLE weakness  . 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 Polycystic kidney  disease     [name] 07/05/2014  . 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 Arthritis     "shoulders" (07/05/2014)  . Bipolar disorder   . Anxiety    Past Surgical History  Procedure Laterality Date  . Cesarean section  1986  . Tubal ligation  1986  . Spinal drain placement  06/29/2014  . Spinal drain removal  07/02/2014  . Thoracentesis  07/05/2014    [name] 07/05/2014  . Thoracic aortic endovascular stent graft N/A 08/23/2014    Procedure: ENDOVASCULAR THORACIC AORTIC  STENT GRAFT;  Surgeon: Sherren Kerns, MD;  Location: [facility];  Service: Vascular;  Laterality: N/A;   Social History:  reports that she has been smoking Cigarettes.  She has a 37 pack-year smoking history. She has never used smokeless tobacco. She reports that she drinks alcohol. She reports that she does not use illicit drugs.  Allergies  Allergen Reactions  . Seroquel [Quetiapine] Other (See Comments)    Night sweats, dizziness and possible confusion  . Olanzapine Nausea And Vomiting    Family History  Problem Relation Age of Onset  . Stroke Father   . Hypertension Father   . Diabetes type II Other   . Hypertension Mother   . Hypertension Sister   . Diabetes Paternal Grandfather     Prior to Admission medications   Medication Sig Start Date End Date Taking? Authorizing Provider  ALPRAZolam (XANAX) 0.25 MG tablet Take 1 tablet (0.25 mg total) by mouth 3 (three) times daily as needed for anxiety. 10/16/14  Yes Doris Cheadle, MD  atorvastatin (LIPITOR) 20 MG tablet Take 1 tablet (20 mg total) by mouth daily at 6 PM. 10/16/14  Yes Doris Cheadle, MD  Diapers & Supplies MISC Use as directed 10/16/14  Yes Doris Cheadle, MD  famotidine (PEPCID) 20 MG tablet Take 1 tablet (20 mg total) by mouth 2 (two) times daily. 10/16/14  Yes Doris Cheadle, MD  feeding supplement, ENSURE COMPLETE, (ENSURE COMPLETE) LIQD Take 237 mLs by mouth 3 (three) times daily between meals. 07/08/14  Yes Christiane Ha, MD  gabapentin (NEURONTIN) 100 MG capsule Take 2 capsules  (200 mg total) by mouth 3 (three) times daily. 08/26/14  Yes Wayne E Gold, PA-C  hydrALAZINE (APRESOLINE) 100 MG tablet Take 1 tablet (100 mg total) by mouth 2 (two) times daily. 10/16/14  Yes Doris Cheadle, MD  labetalol (NORMODYNE) 300 MG tablet Take 1 tablet (300 mg total) by mouth 2 (two) times daily. 10/16/14  Yes Doris Cheadle, MD  lisinopril-hydrochlorothiazide (PRINZIDE,ZESTORETIC) 20-12.5 MG per tablet Take 1 tablet by mouth 2 (two) times daily. 11/07/14  Yes Cassell Clement, MD  methocarbamol (ROBAXIN) 500 MG tablet Take 1 tablet (500 mg total) by mouth every 6 (six) hours as needed for muscle spasms. 10/16/14  Yes Doris Cheadle, MD  Oxycodone HCl 10 MG TABS Take 10 mg by mouth daily.    Yes Historical Provider, MD  polyethylene glycol (MIRALAX / GLYCOLAX) packet Take 17 g by mouth daily after supper. 07/25/14  Yes Evlyn Kanner Love, PA-C  potassium chloride SA (K-DUR,KLOR-CON) 20 MEQ tablet Take 1 tablet (20 mEq total) by mouth 2 (two) times daily. 10/16/14  Yes Doris Cheadle, MD  sertraline (ZOLOFT) 100 MG tablet Take 1 tablet (100 mg total) by mouth daily. 10/16/14  Yes Doris Cheadle, MD  traMADol (ULTRAM) 50 MG tablet Take 1 tablet (50 mg total) by mouth every 6 (six) hours  as needed for moderate pain. 10/16/14  Yes Doris Cheadle, MD  meclizine (ANTIVERT) 25 MG tablet Take 1 tablet (25 mg total) by mouth 3 (three) times daily as needed for nausea. Patient not taking: Reported on 01/16/2015 10/16/14   Doris Cheadle, MD     Physical Exam:  Filed Vitals:   01/16/15 1333 01/16/15 1410 01/16/15 1500 01/16/15 1515  BP: 132/72 128/76 143/69 133/74  Pulse: 70 79 82 80  Temp:      TempSrc:      Resp: Height:     (1.676 m)  Weight:    57.2 kg (126 lb 1.7 oz)  SpO2: 99% 100% 99% 99%    Constitutional: Vital signs reviewed.  Middle aged female lying in bed in no acute distress HEENT: no pallor, no icterus, moist oral mucosa, no cervical lymphadenopathy, no  JVD Cardiovascular: RRR, S1 normal, S2 normal, no MRG Chest: CTAB, no wheezes, rales, or rhonchi Abdominal: Soft. Non-tender, non-distended, bowel sounds are normal,  Ext: warm, no edema Neurological: A&O x3,paraplegic  Labs on Admission:  Basic Metabolic Panel:  Recent Labs Lab 01/16/15 1315  NA 139  K 3.6  CL 108  CO2 24  GLUCOSE 119*  BUN 15  CREATININE 1.41*  CALCIUM 9.4   Liver Function Tests: No results for input(s): AST, ALT, ALKPHOS, BILITOT, PROT, ALBUMIN in the last 168 hours. No results for input(s): LIPASE, AMYLASE in the last 168 hours. No results for input(s): AMMONIA in the last 168 hours. CBC:  Recent Labs Lab 01/16/15 1315  WBC 7.5  HGB 13.4  HCT 40.1  MCV 85.0  PLT 208   Cardiac Enzymes: No results for input(s): CKTOTAL, CKMB, CKMBINDEX, TROPONINI in the last 168 hours. BNP: Invalid input(s): POCBNP CBG: No results for input(s): GLUCAP in the last 168 hours.  Radiological Exams on Admission: Dg Chest Port 1 View  01/16/2015   CLINICAL DATA:  Acute chest pain.  EXAM: PORTABLE CHEST - 1 VIEW  COMPARISON:  October 10, 2014.  FINDINGS: Stable cardiomediastinal silhouette. Stable presence of stent graft in the descending thoracic aorta. No pneumothorax or pleural effusion is noted. No acute pulmonary disease is noted. Bony thorax is intact.  IMPRESSION: No acute cardiopulmonary abnormality seen.   Electronically Signed   By: Lupita Raider, M.D.   On: 01/16/2015 14:36    EKG: Normal sinus rhythm at 85, prolonged QTC of 558, no ST-T changes  Assessment/Plan  Principal Problem:   Unstable angina Admit to stepdown. -Rule out ACS with serial troponin and EKG. Would place on full dose aspirin 325 mg daily, Nitropaste. Continue labetalol and statin. -hold of on anticoagulation until CT angiogram of the chest results available. -Patient's heart score is 5. Cardiology consulted.( Follows with Dr. Patty Sermons as outpatient). Last 2D echo from 06/2014  showing an EF of 65-70%  with grade 1 diastolic dysfunction and no wall motion abnormality. No history of stress test  Active Problems:   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 in 08/2014 Will rule out dissection or leak with CT angiogram and chest and abdomen. If negative will start IV heparin.     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     Chronic renal insufficiency, stage III (moderate) at baseline.    Essential hypertension Resume home blood pressure medications  Tobacco abuse Counseled on cessation. Will order nicotine  patch.   Prolonged Qtc  check mg level.  Diet:cardiac  DVT prophylaxis: pending CT results   Code Status: full code Family Communication: discussed with  Daughter at bedside Disposition Plan:admit to stepdown  Eddie NorthDHUNGEL, Ellarie Picking [facility] Pager [phone]  Total time spent on admission :70 minutes  If 7PM-7AM, please contact night-coverage [email or web address] Password [email or web address] 01/16/2015, 3:29 PM

## 2015-01-16 NOTE — ED Provider Notes (Signed)
CSN: [id number]     Arrival date & time 01/16/15  1246 History   First MD Initiated Contact with Patient 01/16/15 1302     Chief Complaint  Patient presents with  . Chest Pain     HPI Patient presents to emergency room with chest pain shortness of breath which started approximately 8 AM.  Patient has had previous history of ACS and has 1 stent in place.  Patient also has had previous history of stroke.  Patient is wheelchair-bound at home.  Family reports she has some episodes of anxiety occasionally.  She was also noted to have a dissecting aneurysm which was repaired in the last 12 months. Past Medical History  Diagnosis Date  . Hypertension   . Family history of anesthesia complication     "alot of back pains from the epidurals"  . Anemia   . Depression   . GERD (gastroesophageal reflux disease)   . 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 Bipolar disorder   . Anxiety   . Complication of anesthesia     "alot of back pains from the epidurals"  . Daily headache   . Migraine     "sometimes twice/day" (01/16/2015)  . Stroke 06/29/2014    residual BLE weakness  . Arthritis     "shoulders; hands" (01/16/2015)  . Chronic lower back pain   . Polycystic kidney disease     /notes 07/05/2014  . Renal insufficiency    Past Surgical History  Procedure Laterality Date  . Cesarean section  1986  . Tubal ligation  1986  . Spinal drain placement  06/29/2014  . Spinal drain removal  07/02/2014  . Thoracentesis  07/05/2014    [name] 07/05/2014  . Thoracic aortic endovascular stent graft N/A 08/23/2014    Procedure: ENDOVASCULAR THORACIC AORTIC  STENT GRAFT;  Surgeon: Sherren Kerns, MD;  Location: [facility] OR;  Service: Vascular;  Laterality: N/A;   Family History  Problem Relation Age of Onset  . Stroke Father   . Hypertension Father   . Diabetes type II Other   .  Hypertension Mother   . Hypertension Sister   . Diabetes Paternal Grandfather    History  Substance Use Topics  . Smoking status: Current Every Day Smoker -- 1.50 packs/day for 37 years    Types: Cigarettes  . Smokeless tobacco: Never Used  . Alcohol Use: 0.0 oz/week    0 Standard drinks or equivalent per week     Comment: "recovering alcoholic since 2013"   OB History    No data available     Review of Systems  Unable to perform ROS: Other      Allergies  Seroquel and Olanzapine  Home Medications   Prior to Admission medications   Medication Sig Start Date End Date Taking? Authorizing Provider  ALPRAZolam (XANAX) 0.25 MG tablet Take 1 tablet (0.25 mg total) by mouth 3 (three) times daily as needed for anxiety. 10/16/14  Yes Doris Cheadle, MD  atorvastatin (LIPITOR) 20 MG tablet Take 1 tablet (20 mg total) by mouth daily at 6 PM. 10/16/14  Yes Doris Cheadle, MD  Diapers & Supplies MISC Use as directed 10/16/14  Yes Doris Cheadle, MD  famotidine (PEPCID) 20 MG tablet Take 1 tablet (20 mg total) by mouth 2 (two) times daily. 10/16/14  Yes Doris Cheadle, MD  feeding supplement, ENSURE COMPLETE, (ENSURE COMPLETE) LIQD Take 237 mLs by mouth 3 (three) times daily between meals. 07/08/14  Yes Christiane Ha, MD  gabapentin (NEURONTIN) 100 MG capsule Take 2 capsules (200 mg total) by mouth 3 (three) times daily. 08/26/14  Yes Wayne E Gold, PA-C  hydrALAZINE (APRESOLINE) 100 MG tablet Take 1 tablet (100 mg total) by mouth 2 (two) times daily. 10/16/14  Yes Doris Cheadle, MD  labetalol (NORMODYNE) 300 MG tablet Take 1 tablet (300 mg total) by mouth 2 (two) times daily. 10/16/14  Yes Doris Cheadle, MD  lisinopril-hydrochlorothiazide (PRINZIDE,ZESTORETIC) 20-12.5 MG per tablet Take 1 tablet by mouth 2 (two) times daily. 11/07/14  Yes Cassell Clement, MD  methocarbamol (ROBAXIN) 500 MG tablet Take 1 tablet (500 mg total) by mouth every 6 (six) hours as needed for muscle spasms. 10/16/14   Yes Doris Cheadle, MD  Oxycodone HCl 10 MG TABS Take 10 mg by mouth daily.    Yes Historical Provider, MD  polyethylene glycol (MIRALAX / GLYCOLAX) packet Take 17 g by mouth daily after supper. 07/25/14  Yes Evlyn Kanner Love, PA-C  potassium chloride SA (K-DUR,KLOR-CON) 20 MEQ tablet Take 1 tablet (20 mEq total) by mouth 2 (two) times daily. 10/16/14  Yes Doris Cheadle, MD  sertraline (ZOLOFT) 100 MG tablet Take 1 tablet (100 mg total) by mouth daily. 10/16/14  Yes Doris Cheadle, MD  traMADol (ULTRAM) 50 MG tablet Take 1 tablet (50 mg total) by mouth every 6 (six) hours as needed for moderate pain. 10/16/14  Yes Doris Cheadle, MD  meclizine (ANTIVERT) 25 MG tablet Take 1 tablet (25 mg total) by mouth 3 (three) times daily as needed for nausea. Patient not taking: Reported on 01/16/2015 10/16/14   Doris Cheadle, MD   BP 103/62 mmHg  Pulse 79  Temp(Src) 97.6 F (36.4 C) (Oral)  Resp 16  Ht 5\' 6"  (1.676 m)  Wt 134 lb (60.782 kg)  BMI 21.64 kg/m2  SpO2 98% Physical Exam  Constitutional: She appears well-developed and well-nourished. No distress.  HENT:  Head: Normocephalic and atraumatic.  Eyes: Pupils are equal, round, and reactive to light.  Neck: Normal range of motion.  Cardiovascular: Normal rate and intact distal pulses.   Pulmonary/Chest: Tachypnea noted. No respiratory distress. She has decreased breath sounds. She has no wheezes.  Abdominal: Normal appearance. She exhibits no distension.  Musculoskeletal: Normal range of motion.  Neurological: She is alert. No cranial nerve deficit.  Skin: Skin is warm and dry. No rash noted.  Psychiatric: She has a normal mood and affect. Her behavior is normal.  Nursing note and vitals reviewed.   ED Course  Procedures (including critical care time) Labs Review Labs Reviewed  BASIC METABOLIC PANEL - Abnormal; Notable for the following:    Glucose, Bld 119 (*)    Creatinine, Ser 1.41 (*)    GFR calc non Af Amer 43 (*)    GFR calc Af Amer 49  (*)    All other components within normal limits  TROPONIN I - Abnormal; Notable for the following:    Troponin I 0.05 (*)    All other components within normal limits  CBC - Abnormal; Notable for the following:    RDW 15.9 (*)    All other components within normal limits  MRSA PCR SCREENING  CBC  BRAIN NATRIURETIC PEPTIDE  TROPONIN I  TROPONIN I  MAGNESIUM  HEPARIN LEVEL (UNFRACTIONATED)  HEPARIN LEVEL (UNFRACTIONATED)  I-STAT TROPOININ, ED    Imaging Review Dg  Chest Port 1 View  01/16/2015   CLINICAL DATA:  Acute chest pain.  EXAM: PORTABLE CHEST - 1 VIEW  COMPARISON:  October 10, 2014.  FINDINGS: Stable cardiomediastinal silhouette. Stable presence of stent graft in the descending thoracic aorta. No pneumothorax or pleural effusion is noted. No acute pulmonary disease is noted. Bony thorax is intact.  IMPRESSION: No acute cardiopulmonary abnormality seen.   Electronically Signed   By: Lupita Raider, M.D.   On: 01/16/2015 14:36   Ct Angio Chest Aorta W/cm &/or Wo/cm  01/16/2015   CLINICAL DATA:  History of dissection (8/25) post aortic stent placement, now with chest pain and shortness of breath for 1 day. History of smoking, hypertension, polycystic kidney disease.  EXAM: CT ANGIOGRAPHY CHEST, ABDOMEN AND PELVIS  TECHNIQUE: Multidetector CT imaging through the chest, abdomen and pelvis was performed using the standard protocol during bolus administration of intravenous contrast. Multiplanar reconstructed images and MIPs were obtained and reviewed to evaluate the vascular anatomy.  CONTRAST:  OMNIPAQUE IOHEXOL 350 MG/ML SOLN  COMPARISON:  CT of the chest, abdomen and pelvis - 11/17/2014  FINDINGS: Vascular Findings of the chest:  Stable sequela of endovascular repair of descending thoracic aortic dissection. Review of the precontrast images are negative for the presence of an intramural hematoma. The anterior aspect of the proximal end of the stent graft again is noted to project  into the lumen of the aortic arch (sagittal image 87, series 503), unchanged. Otherwise, the proximal end of the stent graft is well apposed against the walls of the distal aspect of the aortic arch wall the distal end of the stent graft is well apposed against the walls of the distal descending thoracic aorta. The stent graft remains widely patent.  The excluded descending thoracic aorta is well apposed against the walls of the stent graft. No evidence of endoleak. No definite thoracic aortic dissection or periaortic stranding.  Normal heart size. Trace amount of pericardial fluid, presumably physiologic. Scattered minimal coronary artery calcifications. There is a minimal amount of ill-defined soft tissue within the anterior mediastinum, similar to the prior examination and favored to represent residual thymic tissue.  Although this examination was not tailored for the evaluation of the pulmonary arteries, there are no discrete filling defects within the central pulmonary arterial tree to suggest central pulmonary embolism. Normal caliber the main pulmonary artery.  -------------------------------------------------------------  Thoracic aortic measurements:  Sinotubular junction  31 mm as measured in greatest oblique coronal dimension.  Proximal ascending aorta  35 mm as measured in greatest oblique axial dimension at the level of the main pulmonary artery. And approximately 36 mm in greatest oblique coronal dimension (image 49, series 502).  Aortic arch aorta  26 mm as measured in greatest oblique coronal dimension (image 57, series 502).  Proximal descending thoracic aorta  37 mm as measured in greatest oblique axial dimension at the level of the main pulmonary artery.  Distal descending thoracic aorta  27 mm as measured in greatest oblique axial dimension at the level of the diaphragmatic hiatus.  Review of the MIP images confirms the above findings.   -------------------------------------------------------------  Non-Vascular Findings of the chest:  Minimal bibasilar dependent ground-glass atelectasis. No discrete focal airspace opacities. No pleural effusion or pneumothorax. The central pulmonary airways appear widely patent.  No discrete pulmonary nodules. No mediastinal, hilar or axillary lymphadenopathy.  No acute or aggressive osseus abnormalities within the chest. Regional soft tissues appear normal.  ---------------------------------------------------------------  Vascular Findings of the abdomen  and pelvis:  Abdominal aorta: There is a moderate amount of eccentric mixed calcified and noncalcified atherosclerotic plaque throughout the normal caliber abdominal aorta, not resulting in a hemodynamically significant stenosis. No abdominal aortic dissection or periaortic stranding.  Celiac artery: There is grossly unchanged eccentric slightly irregular noncalcified atherosclerotic plaque involving the origin the celiac artery, likely resulting in approximately 50% luminal narrowing. The left gastric artery is initially noted to supine an accessory left hepatic artery. Otherwise, conventional branching pattern.  SMA: Widely patent. Conventional branching pattern. The distal tributaries of the SMA appear widely patent throughout their imaged course.  Right Renal artery: Solitary ; there is a minimal amount of eccentric mixed calcified and noncalcified atherosclerotic plaque involving the caudal proximal aspect of the right renal artery, not resulting in hemodynamically significant stenosis.  Left Renal artery: Solitary with early bifurcation. Widely patent without hemodynamically significant stenosis.  IMA: Widely patent without hemodynamically significant stenosis.  Pelvic vasculature: There is a minimal amount of eccentric mixed calcified and noncalcified atherosclerotic plaque throughout the bilateral common iliac arteries, not resulting in hemodynamically  significant stenosis. The bilateral internal iliac arteries are mildly disease, left greater than right, though patent and of normal caliber. The bilateral external iliac arteries are widely patent without hemodynamically significant narrowing. There is a minimal amount of eccentric mixed calcified and noncalcified atherosclerotic plaque within the bilateral common femoral arteries, not resulting in hemodynamically significant stenosis.  Review of the MIP images confirms the above findings.   --------------------------------------------------------------------------------  Nonvascular Findings of the abdomen and pelvis:  Re- demonstrated sequela of polycystic kidney disease with multiple hepatic cysts also re- demonstrated. Normal hepatic contour. Normal appearance of the gallbladder given underdistention. No radiopaque gallstones. No intra or extrahepatic biliary duct dilatation. No ascites.  There is symmetric enhancement of the bilateral kidneys. No definite urinary obstruction. No perinephric stranding. Normal appearance of the bilateral adrenal glands, pancreas and spleen.  The bowel is normal in course and caliber without wall thickening or evidence of obstruction. Normal appearance of the appendix. No pneumoperitoneum, pneumatosis or portal venous gas.  No retroperitoneal, mesenteric, pelvic or inguinal lymphadenopathy.  There is heterogeneous enhancement of the uterus with a suspected approximately 4.3 x 4.3 cm heterogeneously enhancing fibroid within the anterior aspect of the uterus (image 244, series 501). Otherwise, normal appearance of the pelvic organs. Normal appearance of the urinary bladder given degree distention. No free fluid in the pole the cul-de-sac.  No acute or aggressive osseous abnormalities.  Regional soft tissues appear normal.  Review of the MIP images confirms the above findings.  IMPRESSION: Vascular Impression of the chest:  1. Stable sequela of endovascular repair of the descending  thoracic aorta without evidence of complication. Specifically, no evidence of endoleak or evidence of recurrent dissection. 2. Normal caliber and appearance of the ascending thoracic aorta. 3. Coronary artery calcifications. Nonvascular Impression of the chest:  1. No acute cardiopulmonary disease. ---------------------------------------------------------------  Vascular Impression of the abdomen and pelvis:  1. Moderate amount of eccentric mixed calcified and noncalcified atherosclerotic plaque scattered within a normal caliber abdominal aorta, not resulting in a hemodynamically significant stenosis. No abdominal aortic dissection or periaortic stranding. 2. Eccentric noncalcified atherosclerotic plaque involving the origin the celiac artery likely results in approximately 50% luminal narrowing, grossly unchanged. Nonvascular Impression of the abdomen and pelvis:  1. No acute findings within the abdomen or pelvis. 2. Similar findings of polycystic kidney disease and multiple hepatic cysts. 3. Myomatous uterus including an approximately 4.3 cm heterogeneously enhancing fibroid  within the anterior wall of the uterus.   Electronically Signed   By: Simonne Come M.D.   On: 01/16/2015 17:03   Ct Angio Abd/pel W/ And/or W/o  01/16/2015   CLINICAL DATA:  History of dissection (8/25) post aortic stent placement, now with chest pain and shortness of breath for 1 day. History of smoking, hypertension, polycystic kidney disease.  EXAM: CT ANGIOGRAPHY CHEST, ABDOMEN AND PELVIS  TECHNIQUE: Multidetector CT imaging through the chest, abdomen and pelvis was performed using the standard protocol during bolus administration of intravenous contrast. Multiplanar reconstructed images and MIPs were obtained and reviewed to evaluate the vascular anatomy.  CONTRAST:  OMNIPAQUE IOHEXOL 350 MG/ML SOLN  COMPARISON:  CT of the chest, abdomen and pelvis - 11/17/2014  FINDINGS: Vascular Findings of the chest:  Stable sequela of  endovascular repair of descending thoracic aortic dissection. Review of the precontrast images are negative for the presence of an intramural hematoma. The anterior aspect of the proximal end of the stent graft again is noted to project into the lumen of the aortic arch (sagittal image 87, series 503), unchanged. Otherwise, the proximal end of the stent graft is well apposed against the walls of the distal aspect of the aortic arch wall the distal end of the stent graft is well apposed against the walls of the distal descending thoracic aorta. The stent graft remains widely patent.  The excluded descending thoracic aorta is well apposed against the walls of the stent graft. No evidence of endoleak. No definite thoracic aortic dissection or periaortic stranding.  Normal heart size. Trace amount of pericardial fluid, presumably physiologic. Scattered minimal coronary artery calcifications. There is a minimal amount of ill-defined soft tissue within the anterior mediastinum, similar to the prior examination and favored to represent residual thymic tissue.  Although this examination was not tailored for the evaluation of the pulmonary arteries, there are no discrete filling defects within the central pulmonary arterial tree to suggest central pulmonary embolism. Normal caliber the main pulmonary artery.  -------------------------------------------------------------  Thoracic aortic measurements:  Sinotubular junction  31 mm as measured in greatest oblique coronal dimension.  Proximal ascending aorta  35 mm as measured in greatest oblique axial dimension at the level of the main pulmonary artery. And approximately 36 mm in greatest oblique coronal dimension (image 49, series 502).  Aortic arch aorta  26 mm as measured in greatest oblique coronal dimension (image 57, series 502).  Proximal descending thoracic aorta  37 mm as measured in greatest oblique axial dimension at the level of the main pulmonary artery.  Distal  descending thoracic aorta  27 mm as measured in greatest oblique axial dimension at the level of the diaphragmatic hiatus.  Review of the MIP images confirms the above findings.  -------------------------------------------------------------  Non-Vascular Findings of the chest:  Minimal bibasilar dependent ground-glass atelectasis. No discrete focal airspace opacities. No pleural effusion or pneumothorax. The central pulmonary airways appear widely patent.  No discrete pulmonary nodules. No mediastinal, hilar or axillary lymphadenopathy.  No acute or aggressive osseus abnormalities within the chest. Regional soft tissues appear normal.  ---------------------------------------------------------------  Vascular Findings of the abdomen and pelvis:  Abdominal aorta: There is a moderate amount of eccentric mixed calcified and noncalcified atherosclerotic plaque throughout the normal caliber abdominal aorta, not resulting in a hemodynamically significant stenosis. No abdominal aortic dissection or periaortic stranding.  Celiac artery: There is grossly unchanged eccentric slightly irregular noncalcified atherosclerotic plaque involving the origin the celiac artery, likely resulting in approximately  50% luminal narrowing. The left gastric artery is initially noted to supine an accessory left hepatic artery. Otherwise, conventional branching pattern.  SMA: Widely patent. Conventional branching pattern. The distal tributaries of the SMA appear widely patent throughout their imaged course.  Right Renal artery: Solitary ; there is a minimal amount of eccentric mixed calcified and noncalcified atherosclerotic plaque involving the caudal proximal aspect of the right renal artery, not resulting in hemodynamically significant stenosis.  Left Renal artery: Solitary with early bifurcation. Widely patent without hemodynamically significant stenosis.  IMA: Widely patent without hemodynamically significant stenosis.  Pelvic vasculature:  There is a minimal amount of eccentric mixed calcified and noncalcified atherosclerotic plaque throughout the bilateral common iliac arteries, not resulting in hemodynamically significant stenosis. The bilateral internal iliac arteries are mildly disease, left greater than right, though patent and of normal caliber. The bilateral external iliac arteries are widely patent without hemodynamically significant narrowing. There is a minimal amount of eccentric mixed calcified and noncalcified atherosclerotic plaque within the bilateral common femoral arteries, not resulting in hemodynamically significant stenosis.  Review of the MIP images confirms the above findings.   --------------------------------------------------------------------------------  Nonvascular Findings of the abdomen and pelvis:  Re- demonstrated sequela of polycystic kidney disease with multiple hepatic cysts also re- demonstrated. Normal hepatic contour. Normal appearance of the gallbladder given underdistention. No radiopaque gallstones. No intra or extrahepatic biliary duct dilatation. No ascites.  There is symmetric enhancement of the bilateral kidneys. No definite urinary obstruction. No perinephric stranding. Normal appearance of the bilateral adrenal glands, pancreas and spleen.  The bowel is normal in course and caliber without wall thickening or evidence of obstruction. Normal appearance of the appendix. No pneumoperitoneum, pneumatosis or portal venous gas.  No retroperitoneal, mesenteric, pelvic or inguinal lymphadenopathy.  There is heterogeneous enhancement of the uterus with a suspected approximately 4.3 x 4.3 cm heterogeneously enhancing fibroid within the anterior aspect of the uterus (image 244, series 501). Otherwise, normal appearance of the pelvic organs. Normal appearance of the urinary bladder given degree distention. No free fluid in the pole the cul-de-sac.  No acute or aggressive osseous abnormalities.  Regional soft tissues  appear normal.  Review of the MIP images confirms the above findings.  IMPRESSION: Vascular Impression of the chest:  1. Stable sequela of endovascular repair of the descending thoracic aorta without evidence of complication. Specifically, no evidence of endoleak or evidence of recurrent dissection. 2. Normal caliber and appearance of the ascending thoracic aorta. 3. Coronary artery calcifications. Nonvascular Impression of the chest:  1. No acute cardiopulmonary disease. ---------------------------------------------------------------  Vascular Impression of the abdomen and pelvis:  1. Moderate amount of eccentric mixed calcified and noncalcified atherosclerotic plaque scattered within a normal caliber abdominal aorta, not resulting in a hemodynamically significant stenosis. No abdominal aortic dissection or periaortic stranding. 2. Eccentric noncalcified atherosclerotic plaque involving the origin the celiac artery likely results in approximately 50% luminal narrowing, grossly unchanged. Nonvascular Impression of the abdomen and pelvis:  1. No acute findings within the abdomen or pelvis. 2. Similar findings of polycystic kidney disease and multiple hepatic cysts. 3. Myomatous uterus including an approximately 4.3 cm heterogeneously enhancing fibroid within the anterior wall of the uterus.   Electronically Signed   By: Simonne ComeJohn  Watts M.D.   On: 01/16/2015 17:03     EKG Interpretation   Date/Time:  Thursday January 16 2015 12:49:16 EST Ventricular Rate:  85 PR Interval:  136 QRS Duration: 94 QT Interval:  469 QTC Calculation: 558 R Axis:  54 Text Interpretation:  Sinus rhythm Atrial premature complex Probable left  atrial enlargement Probable left ventricular hypertrophy Prolonged QT  interval Baseline wander in lead(s) II aVR No significant change since  last tracing Confirmed by Piera Downs  MD, Kimi Kroft ([id number]) on 01/16/2015 12:55:54  PM      MDM   Final diagnoses:  Chest pain, unspecified chest  pain type        Nelia Shi, MD 01/17/15 [id number]

## 2015-01-16 NOTE — Consult Note (Addendum)
Patient ID: Charlotte [name] MRN: [id number], DOB/AGE: January 08, 1966   Admit date: 01/16/2015   Reason for Consult: Chest Pain Referring MD: [facility]   Primary Physician: Doris Cheadle, MD Primary Cardiologist: Dr. Patty Sermons  Pt. 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   Problem List  Past Medical History  Diagnosis Date  . Hypertension   . Family history of anesthesia complication     "alot of back pains from the epidurals"  . Complication of anesthesia     "alot of back pains from the epidurals"  . Anemia   . Depression   . GERD (gastroesophageal reflux disease)   . Daily headache   . Migraine     "sometimes twice/day" (07/05/2014)  . Stroke 06/29/2014    residual BLE weakness  . 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 Polycystic kidney disease     /notes 07/05/2014  . 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 Arthritis     "shoulders" (07/05/2014)  . Bipolar disorder   . Anxiety   . Renal insufficiency     Past Surgical History  Procedure Laterality Date  . Cesarean section  1986  . Tubal ligation  1986  . Spinal drain placement  06/29/2014  . Spinal drain removal  07/02/2014  . Thoracentesis  07/05/2014    [name] 07/05/2014  . Thoracic aortic endovascular stent graft N/A 08/23/2014    Procedure: ENDOVASCULAR THORACIC AORTIC  STENT GRAFT;  Surgeon: Sherren Kerns, MD;  Location: [facility] OR;  Service: Vascular;  Laterality: N/A;     Allergies  Allergies  Allergen Reactions  . Seroquel [Quetiapine] Other (See Comments)    Night sweats, dizziness and possible confusion  . 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 This resulted in spinal cord ischemia and subsequent lower extremity paresthesias. This was repaired by a graft stent. Her other history is notable for CVA, stage III CKD, tobacco abuse (1ppd) and HTN. She has a history of normal LVF. 2D echo 06/2014 demonstrated EF of 60-65% w/ G1DD and no WMA. She is followed by Dr. Patty Sermons. She is now wheelchair bound.  She presents to the [facility] ER today with complaints of chest pain. Symptoms occurred this am at rest. Described as a sharp + burning sensation. Substernal and radiating to chest. No radiation to back. Not as severe as pain in October when she had her dissection. Some improvement with nitro patch. She reports full medication compliance. Unfortunately she has resumed smoking (1ppd).   CT of chest, abdomen and pelvis shows stable sequela of endovascular repair of descending thoracic aortic dissection.  The sent graft remains widely patent. No evidence for endoleak. No definite thoracic aortic dissection or periaortic stranding. There is mention of trace amount of pericardial fluid and scattered minimal coronary artery calcifications. No defects within the central pulmonary arterial tree to suggest PE. CXR is unremarkable. EKG shows SR with PACs and prolonged QT/QTc of 469/558. POC troponin is negative x 1. BP is stable at 124/70. HR in  the 80s. SCr 1.41.    Home Medications  Prior to Admission medications   Medication Sig Start Date End Date Taking? Authorizing Provider  ALPRAZolam (XANAX) 0.25 MG tablet Take 1 tablet (0.25 mg total) by mouth 3 (three) times daily as needed for anxiety. 10/16/14  Yes Doris Cheadleeepak Advani, MD  atorvastatin (LIPITOR) 20 MG tablet Take 1 tablet (20 mg total) by mouth daily at 6 PM. 10/16/14  Yes Doris Cheadleeepak Advani, MD  Diapers & Supplies MISC Use as directed 10/16/14  Yes Doris Cheadleeepak Advani, MD  famotidine (PEPCID) 20 MG tablet Take 1 tablet (20 mg total) by  mouth 2 (two) times daily. 10/16/14  Yes Doris Cheadleeepak Advani, MD  feeding supplement, ENSURE COMPLETE, (ENSURE COMPLETE) LIQD Take 237 mLs by mouth 3 (three) times daily between meals. 07/08/14  Yes Christiane Haorinna L Sullivan, MD  gabapentin (NEURONTIN) 100 MG capsule Take 2 capsules (200 mg total) by mouth 3 (three) times daily. 08/26/14  Yes Wayne E Gold, PA-C  hydrALAZINE (APRESOLINE) 100 MG tablet Take 1 tablet (100 mg total) by mouth 2 (two) times daily. 10/16/14  Yes Doris Cheadleeepak Advani, MD  labetalol (NORMODYNE) 300 MG tablet Take 1 tablet (300 mg total) by mouth 2 (two) times daily. 10/16/14  Yes Doris Cheadleeepak Advani, MD  lisinopril-hydrochlorothiazide (PRINZIDE,ZESTORETIC) 20-12.5 MG per tablet Take 1 tablet by mouth 2 (two) times daily. 11/07/14  Yes Cassell Clementhomas Brackbill, MD  methocarbamol (ROBAXIN) 500 MG tablet Take 1 tablet (500 mg total) by mouth every 6 (six) hours as needed for muscle spasms. 10/16/14  Yes Doris Cheadleeepak Advani, MD  Oxycodone HCl 10 MG TABS Take 10 mg by mouth daily.    Yes Historical Provider, MD  polyethylene glycol (MIRALAX / GLYCOLAX) packet Take 17 g by mouth daily after supper. 07/25/14  Yes Evlyn KannerPamela S Love, PA-C  potassium chloride SA (K-DUR,KLOR-CON) 20 MEQ tablet Take 1 tablet (20 mEq total) by mouth 2 (two) times daily. 10/16/14  Yes Doris Cheadleeepak Advani, MD  sertraline (ZOLOFT) 100 MG tablet Take 1 tablet (100 mg total) by mouth daily. 10/16/14  Yes Doris Cheadleeepak Advani, MD  traMADol (ULTRAM) 50 MG tablet Take 1 tablet (50 mg total) by mouth every 6 (six) hours as needed for moderate pain. 10/16/14  Yes Doris Cheadleeepak Advani, MD  meclizine (ANTIVERT) 25 MG tablet Take 1 tablet (25 mg total) by mouth 3 (three) times daily as needed for nausea. Patient not taking: Reported on 01/16/2015 10/16/14   Doris Cheadleeepak Advani, MD    Family History  Family History  Problem Relation Age of Onset  . Stroke Father   . Hypertension Father   . Diabetes type II Other   . Hypertension Mother   . Hypertension Sister   . Diabetes Paternal  Grandfather     Social History  History   Social History  . Marital Status: Single    Spouse Name: N/A  . Number of Children: N/A  . Years of Education: N/A   Occupational History  . Not on file.   Social History Main Topics  . Smoking status: Current Every Day Smoker -- 1.00 packs/day for 37 years    Types: Cigarettes  . Smokeless tobacco: Never Used  . Alcohol Use: 0.0 oz/week    0 Standard drinks or equivalent per week     Comment: 07/05/2014 "recovering alcoholic since 2013"  . Drug Use: No     Comment: 07/05/2014 "recovering cocaine addict since 2013"  . Sexual Activity: Not on file   Other Topics Concern  . Not on file  Social History Narrative     Review of Systems General:  No chills, fever, night sweats or weight changes.  Cardiovascular:  No chest pain, dyspnea on exertion, edema, orthopnea, palpitations, paroxysmal nocturnal dyspnea. Dermatological: No rash, lesions/masses Respiratory: No cough, dyspnea Urologic: No hematuria, dysuria Abdominal:   No nausea, vomiting, diarrhea, bright red blood per rectum, melena, or hematemesis Neurologic:  No visual changes, wkns, changes in mental status. All other systems reviewed and are otherwise negative except as noted above.  Physical Exam  Blood pressure 124/70, pulse 88, temperature 97.4 F (36.3 C), temperature source Oral, resp. rate 20, height  (1.676 m), weight 126 lb 1.7 oz (57.2 kg), SpO2 99 %.  General: Pleasant, NAD Psych: Normal affect. Neuro: Alert and oriented X 3. Moves all extremities spontaneously. HEENT: Normal  Neck: Supple without bruits or JVD. Lungs:  Resp regular and unlabored, CTA. Heart: RRR no s3, s4, or murmurs. Abdomen: Soft, non-tender, non-distended, BS + x 4.  Extremities: No clubbing, cyanosis or edema. DP/PT/Radials 2+ and equal bilaterally.  Labs  Troponin (Point of Care Test)  Recent Labs  01/16/15 1340  TROPIPOC 0.00   No results for input(s): CKTOTAL, CKMB,  TROPONINI in the last 72 hours. Lab Results  Component Value Date   WBC 7.5 01/16/2015   HGB 13.4 01/16/2015   HCT 40.1 01/16/2015   MCV 85.0 01/16/2015   PLT 208 01/16/2015    Recent Labs Lab 01/16/15 1315  NA 139  K 3.6  CL 108  CO2 24  BUN 15  CREATININE 1.41*  CALCIUM 9.4  GLUCOSE 119*   Lab Results  Component Value Date   CHOL 201* 06/30/2014   HDL 53 06/30/2014   LDLCALC 129* 06/30/2014   TRIG 96 06/30/2014   No results found for: DDIMER   Radiology/Studies  Dg Chest Port 1 View  01/16/2015   CLINICAL DATA:  Acute chest pain.  EXAM: PORTABLE CHEST - 1 VIEW  COMPARISON:  October 10, 2014.  FINDINGS: Stable cardiomediastinal silhouette. Stable presence of stent graft in the descending thoracic aorta. No pneumothorax or pleural effusion is noted. No acute pulmonary disease is noted. Bony thorax is intact.  IMPRESSION: No acute cardiopulmonary abnormality seen.   Electronically Signed   By: Lupita Raider, M.D.   On: 01/16/2015 14:36    ECG  NSR with PACs  ASSESSMENT AND PLAN  1. Chest Pain: CT of chest, abdomen and pelvis shows no acute findings. Aorta is stable w/o evidence for dissection. Sent graft remains widely patent. No evidence for endoleak. Also no evidence for PE. EKG nonischemic. Agree with plan outlined by TRH. Continue to cycle cardiac enzymes x 3 to rule out ACS. Continue medical therapy. Plan for possible NST in the am if enzymes are negative.   2. 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 in 08/2014: CT negative for acute findings. Stable graft site. BP well controlled. Smoking cessation strongly encouraged.    3. Spinal Cord Ischemia w/ LE Paresthesia: result of problem #2. Wheelchair bound at baseline.   4. HTN: well controlled. Continue current regimen.   5. CKD: Scr 1.41.  6. Tobacco abuse: smokes 1ppd. Smoking cessation strongly advised given her significant vascular history.   Signed, Robbie Lis,  PA-C 01/16/2015, 4:39 PM   The patient was seen, examined and discussed with Brittainy M. Sharol Harness, PA-C and I agree with the above.   49 year old female with significant PMH for type B aortic dissection in September  2015 with spinal cord ischemia and subsequent lower extremity paresthesias, s/p repair. She presented today with typical retrosternal pressure like pain 10/10 that improved with NTG patch to 8/10. CT chest abdomen showed no endoleak. She has no prior cardiac history or testing.  The first troponin is negative, ECG doesn't show any ischemic changes and is unchanged from prior. We will continue to cycle troponin and ECGs, and keep NPO. If pain resolves and no troponin elevation we will schedule a Lexiscan myoview, if any troponin elevation we will consider cath. However, stress preferred considering recent aortic repair and CKD stage 3. On heparin drip for unstable angina.   Continue atorvastatin, labetalol, add aspirin.    Lars Masson 01/16/2015

## 2015-01-16 NOTE — ED Notes (Signed)
TO ED via [facility] from home, with c/o chest pain, started at approx 8am, pt received ASA 324mg  , NTG 1 sl enroute. Pt is wheelchair bound at home, on 2nd story.

## 2015-01-16 NOTE — ED Notes (Signed)
Family at bedside. Daughters -- [place]bor[place] and [place]

## 2015-01-16 NOTE — ED Notes (Addendum)
Pt c/o cramping in legs and pain. Has not taken any of her medicines this morning. Dr. Radford PaxBeaton notified.

## 2015-01-16 NOTE — Progress Notes (Signed)
ANTICOAGULATION CONSULT NOTE - Initial Consult  Pharmacy Consult for Heparin Indication: chest pain/ACS  Allergies  Allergen Reactions  . Seroquel [Quetiapine] Other (See Comments)    Night sweats, dizziness and possible confusion  . Olanzapine Nausea And Vomiting    Patient Measurements: Height: 5\' 6"  (167.6 cm) Weight: 126 lb 1.7 oz (57.2 kg) (stated) IBW/kg (Calculated) : 59.3 Heparin Dosing Weight: 57 kg  Vital Signs: Temp: 97.4 F (36.3 C) (02/25 1258) Temp Source: Oral (02/25 1258) BP: 133/74 mmHg (02/25 1515) Pulse Rate: 80 (02/25 1515)  Labs:  Recent Labs  01/16/15 1315  HGB 13.4  HCT 40.1  PLT 208  CREATININE 1.41*    CrCl cannot be calculated (Unknown ideal weight.).   Medical History: Past Medical History  Diagnosis Date  . Hypertension   . Family history of anesthesia complication     "alot of back pains from the epidurals"  . Complication of anesthesia     "alot of back pains from the epidurals"  . Anemia   . Depression   . GERD (gastroesophageal reflux disease)   . Daily headache   . Migraine     "sometimes twice/day" (07/05/2014)  . Stroke 06/29/2014    residual BLE weakness  . Dissecting aneurysm of thoracic aorta, Stanford type B 06/29/2014    [name]/notes 07/05/2014  . Polycystic kidney disease     /notes 07/05/2014  . 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 Arthritis     "shoulders" (07/05/2014)  . Bipolar disorder   . Anxiety     Medications:   (Not in a hospital admission) Scheduled:  Infusions:   Assessment: 49yo female with history of anemia, stroke and dissecting aneurysm in 06/2014, HTN and paraparesis of lower extremities presents with chest pain. Pharmacy is consulted to dose heparin for ACS/chest pain. CBC is wnl, BNP 69.4, Trop neg x1, sCr 1.41.  Goal of Therapy:  Heparin level 0.3-0.7 units/ml Monitor platelets by anticoagulation protocol: Yes   Plan:  Give 3000 units bolus x  1 Start heparin infusion at 700 units/hr Check anti-Xa level in 6 hours and daily while on heparin Continue to monitor H&H and platelets  Monitor s/sx of bleeding   Arlean Hoppingorey M. Newman PiesBall, PharmD Clinical Pharmacist Pager [phone] 01/16/2015,3:26 PM

## 2015-01-17 ENCOUNTER — Inpatient Hospital Stay (HOSPITAL_COMMUNITY): Payer: Medicaid Other

## 2015-01-17 DIAGNOSIS — R079 Chest pain, unspecified: Secondary | ICD-10-CM

## 2015-01-17 LAB — TROPONIN I
Troponin I: <0.03 ng/mL (ref <0.031)
Troponin I: 0.05 ng/mL — ABNORMAL HIGH (ref <0.031)
Troponin I: <0.03 ng/mL (ref <0.031)

## 2015-01-17 LAB — CBC
HCT: 38.2 % (ref 36.0–46.0)
Hemoglobin: 12 g/dL (ref 12.0–15.0)
MCH: 27 pg (ref 26.0–34.0)
MCHC: 31.4 g/dL (ref 30.0–36.0)
MCV: 86 fL (ref 78.0–100.0)
PLATELETS: 240 10*3/uL (ref 150–400)
RBC: 4.44 MIL/uL (ref 3.87–5.11)
RDW: 15.9 % — AB (ref 11.5–15.5)
WBC: 6.9 10*3/uL (ref 4.0–10.5)

## 2015-01-17 LAB — HEPARIN LEVEL (UNFRACTIONATED)
Heparin Unfractionated: 0.3 IU/mL (ref 0.30–0.70)
Heparin Unfractionated: 0.42 IU/mL (ref 0.30–0.70)

## 2015-01-17 MED ORDER — GI COCKTAIL ~~LOC~~
30.0000 mL | Freq: Three times a day (TID) | ORAL | Status: DC | PRN
  Administered 2015-01-17: 30 mL via ORAL
  Filled 2015-01-17: qty 30

## 2015-01-17 MED ORDER — TECHNETIUM TC 99M SESTAMIBI GENERIC - CARDIOLITE
10.0000 | Freq: Once | INTRAVENOUS | Status: AC | PRN
Start: 2015-01-17 — End: 2015-01-17
  Administered 2015-01-17: 10 via INTRAVENOUS

## 2015-01-17 MED ORDER — TECHNETIUM TC 99M SESTAMIBI GENERIC - CARDIOLITE
30.0000 | Freq: Once | INTRAVENOUS | Status: AC | PRN
  Administered 2015-01-17: 30 via INTRAVENOUS

## 2015-01-17 MED ORDER — GI COCKTAIL ~~LOC~~: 30.0000 mL | Freq: Once | ORAL | Status: DC

## 2015-01-17 MED ORDER — REGADENOSON 0.4 MG/5ML IV SOLN
INTRAVENOUS | Status: AC
  Administered 2015-01-17: 0.4 mg via INTRAVENOUS
  Filled 2015-01-17: qty 5

## 2015-01-17 MED ORDER — IPRATROPIUM-ALBUTEROL 0.5-2.5 (3) MG/3ML IN SOLN
3.0000 mL | Freq: Three times a day (TID) | RESPIRATORY_TRACT | Status: DC
Start: 2015-01-17 — End: 2015-01-19
  Administered 2015-01-17 – 2015-01-19 (×6): 3 mL via RESPIRATORY_TRACT
  Filled 2015-01-17 (×6): qty 3

## 2015-01-17 MED ORDER — PANTOPRAZOLE SODIUM 40 MG PO TBEC
40.0000 mg | DELAYED_RELEASE_TABLET | Freq: Two times a day (BID) | ORAL | Status: DC
  Administered 2015-01-17 – 2015-01-20 (×6): 40 mg via ORAL
  Filled 2015-01-17 (×7): qty 1

## 2015-01-17 NOTE — Progress Notes (Signed)
  NST results are as follows: IMPRESSION: 1. No reversible ischemia or infarction. Diaphragmatic attenuation of the inferior wall  2. Normal left ventricular wall motion.  3. Left ventricular ejection fraction 75%  4. Low-risk stress test findings*.  RN and primary team made aware.   Charlotte [name], Charlotte [name] 01/17/2015 3:33 PM

## 2015-01-17 NOTE — Progress Notes (Signed)
UR Completed Damonie Ellenwood Graves-Bigelow, RN,BSN [phone]  

## 2015-01-17 NOTE — Progress Notes (Addendum)
Patient Name: Charlotte [name] Date of Encounter: 01/17/2015  Primary Cardiologist: Dr. 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 aspirin  81 mg Oral Daily  . atorvastatin  20 mg Oral q1800  . famotidine  20 mg Oral BID  . feeding supplement (ENSURE COMPLETE)  237 mL Oral TID BM  . gabapentin  200 mg Oral TID  . hydrALAZINE  100 mg Oral BID  . lisinopril  20 mg Oral Daily   And  . hydrochlorothiazide  12.5 mg Oral Daily  . labetalol  300 mg Oral BID  . nicotine  14 mg Transdermal Daily  . nitroGLYCERIN  1 inch Topical 3 times per day  . oxyCODONE  10 mg Oral Daily  . polyethylene glycol  17 g Oral QPC supper  . potassium chloride SA  20 mEq Oral BID  . regadenoson  0.4 mg Intravenous Once  . sertraline  100 mg Oral Daily  . sodium chloride  3 mL Intravenous Q12H    OBJECTIVE  Filed Vitals:   01/16/15 2200 01/17/15 0432 01/17/15 0500 01/17/15 0753  BP: 163/79  133/65 103/62  Pulse: 58  67 79  Temp:  97.4 F (36.3 C)  97.6 F (36.4 C)  TempSrc:  Oral  Oral  Resp: Height:      Weight:  134 lb (60.782 kg)    SpO2: 99%  94% 98%    Intake/Output Summary (Last 24 hours) at 01/17/15 1006 Last data filed at 01/17/15 0750  Gross per 24 hour  Intake    237 ml  Output      0 ml  Net    237 ml   Filed Weights   01/16/15 1515 01/16/15 1702 01/17/15 0432  Weight: 126 lb 1.7 oz (57.2 kg) 125 lb 6.4 oz (56.881 kg) 134 lb (60.782 kg)    PHYSICAL EXAM  General: Pleasant, NAD. Neuro: Alert and oriented X 3. Moves all extremities spontaneously. Psych: Normal affect. HEENT:  Normal  Neck:  Supple without bruits or JVD. Lungs:  Resp regular and unlabored, CTA. Heart: RRR no s3, s4. 1/6 systolic murmur Abdomen: Soft, non-tender, non-distended, BS + x 4.  Extremities: No clubbing, cyanosis or edema. Paraplegic Accessory Clinical Findings  CBC  Recent Labs  01/16/15 1315 01/17/15 0400  WBC 7.5 6.9  HGB 13.4 12.0  HCT 40.1 38.2  MCV 85.0 86.0  PLT 208 240   Basic Metabolic Panel  Recent Labs  01/16/15 1315 01/16/15 2143  NA 139  --   K 3.6  --   CL 108  --   CO2 24  --   GLUCOSE 119*  --   BUN 15  --   CREATININE 1.41*  --   CALCIUM 9.4  --   MG  --  2.0   Cardiac Enzymes  Recent Labs  01/16/15 1716 01/16/15 2250 01/17/15 0400  TROPONINI <0.03 <0.03 0.05*    TELE NSR with HR 70-80s    ECG  NSR with nonspecific T wave changes  Echocardiogram 06/30/2014  LV EF: 60% -  65%  -------------------------------------------------------------------  Indications:   Aortic Dissection Distal To the Left Subclavion (441).  ------------------------------------------------------------------- History:  Risk factors: Current tobacco use. Hypertension.  ------------------------------------------------------------------- Study Conclusions  - Left ventricle: The cavity size was normal. There was moderate concentric hypertrophy. Systolic function was normal. The estimated ejection fraction was in the range of 60% to 65%. Wall motion was normal; there were no regional wall motion abnormalities. Doppler parameters are consistent with abnormal left ventricular relaxation (grade 1 diastolic dysfunction). The E/e&' ratio is <8, suggesting normal LV filling pressure. - Aorta: No clear dissection flap visualized in this study. The aortic arch size is generous, the descending aorta is well visualized and no evidence for dissection is noted. - Left atrium: The atrium was normal in size. - Tricuspid valve: There was mild regurgitation. -  Pulmonary arteries: PA peak pressure: 26 mm Hg (S). - Pericardium, extracardiac: There was no pericardial effusion.  Impressions:  - LVEF 60-65%, moderate concentric LVH, normal LA size, generous aortic arch without clear visualization of a dissection flap, normal descending aorta, diastolic dysfunction, normal LV fililng pressure, no pericardial effusion.    Radiology/Studies  Dg Chest Port 1 View  01/16/2015   CLINICAL DATA:  Acute chest pain.  EXAM: PORTABLE CHEST - 1 VIEW  COMPARISON:  October 10, 2014.  FINDINGS: Stable cardiomediastinal silhouette. Stable presence of stent graft in the descending thoracic aorta. No pneumothorax or pleural effusion is noted. No acute pulmonary disease is noted. Bony thorax is intact.  IMPRESSION: No acute cardiopulmonary abnormality seen.   Electronically Signed   By: Lupita Raider, M.D.   On: 01/16/2015 14:36   Ct Angio Chest Aorta W/cm &/or Wo/cm  01/16/2015   CLINICAL DATA:  History of dissection (8/25) post aortic stent placement, now with chest pain and shortness of breath for 1 day. History of smoking, hypertension, polycystic kidney disease.  EXAM: CT ANGIOGRAPHY CHEST, ABDOMEN AND PELVIS  TECHNIQUE: Multidetector CT imaging through the chest, abdomen and pelvis was performed using the standard protocol during bolus administration of intravenous contrast. Multiplanar reconstructed images and MIPs were obtained and reviewed to evaluate the vascular anatomy.  CONTRAST:  OMNIPAQUE IOHEXOL 350 MG/ML SOLN  COMPARISON:  CT of the chest, abdomen and pelvis - 11/17/2014  FINDINGS: Vascular Findings of the chest:  Stable sequela of endovascular repair of descending thoracic aortic dissection. Review of the precontrast images are negative for the presence of an intramural hematoma. The anterior aspect of the proximal end of the stent graft again is noted to project into the lumen of the aortic arch (sagittal image 87, series 503), unchanged.  Otherwise, the proximal end of the stent graft is well apposed against the walls of the distal aspect of the aortic arch wall the distal end of the stent graft is well apposed against the walls of the distal descending thoracic aorta. The stent graft remains widely patent.  The excluded descending thoracic aorta is well apposed against the walls of the stent graft. No evidence of endoleak. No definite thoracic aortic dissection or periaortic stranding.  Normal heart size. Trace amount of pericardial fluid, presumably physiologic. Scattered minimal coronary artery calcifications. There is a minimal amount of ill-defined soft tissue within the anterior mediastinum, similar to the prior examination and favored to represent residual thymic tissue.  Although this examination was not tailored for the evaluation of the pulmonary arteries, there are no discrete filling defects within the central pulmonary arterial tree to suggest central pulmonary embolism. Normal caliber the main pulmonary artery.  -------------------------------------------------------------  Thoracic aortic measurements:  Sinotubular junction  31 mm as measured in greatest oblique coronal dimension.  Proximal ascending aorta  35 mm as measured in greatest oblique axial dimension at the level of the main pulmonary artery. And approximately 36 mm in greatest oblique coronal dimension (image 49, series 502).  Aortic arch aorta  26 mm as measured in greatest oblique coronal dimension (image 57, series 502).  Proximal descending thoracic aorta  37 mm as measured in greatest oblique axial dimension at the level of the main pulmonary artery.  Distal descending thoracic aorta  27 mm as measured in greatest oblique axial dimension at the level of the diaphragmatic hiatus.  Review of the MIP images confirms the above findings.  -------------------------------------------------------------  Non-Vascular Findings of the chest:  Minimal bibasilar dependent  ground-glass atelectasis. No discrete focal airspace opacities. No pleural effusion or pneumothorax. The central pulmonary airways appear widely patent.  No discrete pulmonary nodules. No mediastinal, hilar or axillary lymphadenopathy.  No acute or aggressive osseus abnormalities within the chest. Regional soft tissues appear normal.  ---------------------------------------------------------------  Vascular Findings of the abdomen and pelvis:  Abdominal aorta: There is a moderate amount of eccentric mixed calcified and noncalcified atherosclerotic plaque throughout the normal caliber abdominal aorta, not resulting in a hemodynamically significant stenosis. No abdominal aortic dissection or periaortic stranding.  Celiac artery: There is grossly unchanged eccentric slightly irregular noncalcified atherosclerotic plaque involving the origin the celiac artery, likely resulting in approximately 50% luminal narrowing. The left gastric artery is initially noted to supine an accessory left hepatic artery. Otherwise, conventional branching pattern.  SMA: Widely patent. Conventional branching pattern. The distal tributaries of the SMA appear widely patent throughout their imaged course.  Right Renal artery: Solitary ; there is a minimal amount of eccentric mixed calcified and noncalcified atherosclerotic plaque involving the caudal proximal aspect of the right renal artery, not resulting in hemodynamically significant stenosis.  Left Renal artery: Solitary with early bifurcation. Widely patent without hemodynamically significant stenosis.  IMA: Widely patent without hemodynamically significant stenosis.  Pelvic vasculature: There is a minimal amount of eccentric mixed calcified and noncalcified atherosclerotic plaque throughout the bilateral common iliac arteries, not resulting in hemodynamically significant stenosis. The bilateral internal iliac arteries are mildly disease, left greater than right, though patent and of  normal caliber. The bilateral external iliac arteries are widely patent without hemodynamically significant narrowing. There is a minimal amount of eccentric mixed calcified and noncalcified atherosclerotic plaque within the bilateral common femoral arteries, not resulting in hemodynamically significant stenosis.  Review of the MIP images confirms the above findings.   --------------------------------------------------------------------------------  Nonvascular Findings of the abdomen and pelvis:  Re- demonstrated sequela of polycystic kidney disease with multiple hepatic cysts also re- demonstrated. Normal hepatic contour. Normal appearance of the gallbladder given underdistention. No radiopaque gallstones. No intra or extrahepatic biliary duct dilatation. No ascites.  There is symmetric enhancement of the bilateral kidneys. No definite urinary obstruction. No perinephric stranding. Normal appearance of the bilateral adrenal glands, pancreas and spleen.  The bowel is normal in course and caliber without wall thickening or evidence of obstruction. Normal appearance of the appendix. No pneumoperitoneum, pneumatosis or portal venous gas.  No retroperitoneal, mesenteric, pelvic or inguinal lymphadenopathy.  There is heterogeneous enhancement of the uterus with a suspected approximately 4.3 x 4.3 cm heterogeneously enhancing fibroid within the anterior aspect of the uterus (image 244, series 501). Otherwise, normal appearance of the pelvic organs. Normal appearance of the urinary bladder given degree distention. No  free fluid in the pole the cul-de-sac.  No acute or aggressive osseous abnormalities.  Regional soft tissues appear normal.  Review of the MIP images confirms the above findings.  IMPRESSION: Vascular Impression of the chest:  1. Stable sequela of endovascular repair of the descending thoracic aorta without evidence of complication. Specifically, no evidence of endoleak or evidence of recurrent dissection. 2.  Normal caliber and appearance of the ascending thoracic aorta. 3. Coronary artery calcifications. Nonvascular Impression of the chest:  1. No acute cardiopulmonary disease. ---------------------------------------------------------------  Vascular Impression of the abdomen and pelvis:  1. Moderate amount of eccentric mixed calcified and noncalcified atherosclerotic plaque scattered within a normal caliber abdominal aorta, not resulting in a hemodynamically significant stenosis. No abdominal aortic dissection or periaortic stranding. 2. Eccentric noncalcified atherosclerotic plaque involving the origin the celiac artery likely results in approximately 50% luminal narrowing, grossly unchanged. Nonvascular Impression of the abdomen and pelvis:  1. No acute findings within the abdomen or pelvis. 2. Similar findings of polycystic kidney disease and multiple hepatic cysts. 3. Myomatous uterus including an approximately 4.3 cm heterogeneously enhancing fibroid within the anterior wall of the uterus.   Electronically Signed   By: Simonne Come M.D.   On: 01/16/2015 17:03   Ct Angio Abd/pel W/ And/or W/o  01/16/2015   CLINICAL DATA:  History of dissection (8/25) post aortic stent placement, now with chest pain and shortness of breath for 1 day. History of smoking, hypertension, polycystic kidney disease.  EXAM: CT ANGIOGRAPHY CHEST, ABDOMEN AND PELVIS  TECHNIQUE: Multidetector CT imaging through the chest, abdomen and pelvis was performed using the standard protocol during bolus administration of intravenous contrast. Multiplanar reconstructed images and MIPs were obtained and reviewed to evaluate the vascular anatomy.  CONTRAST:  OMNIPAQUE IOHEXOL 350 MG/ML SOLN  COMPARISON:  CT of the chest, abdomen and pelvis - 11/17/2014  FINDINGS: Vascular Findings of the chest:  Stable sequela of endovascular repair of descending thoracic aortic dissection. Review of the precontrast images are negative for the presence of an  intramural hematoma. The anterior aspect of the proximal end of the stent graft again is noted to project into the lumen of the aortic arch (sagittal image 87, series 503), unchanged. Otherwise, the proximal end of the stent graft is well apposed against the walls of the distal aspect of the aortic arch wall the distal end of the stent graft is well apposed against the walls of the distal descending thoracic aorta. The stent graft remains widely patent.  The excluded descending thoracic aorta is well apposed against the walls of the stent graft. No evidence of endoleak. No definite thoracic aortic dissection or periaortic stranding.  Normal heart size. Trace amount of pericardial fluid, presumably physiologic. Scattered minimal coronary artery calcifications. There is a minimal amount of ill-defined soft tissue within the anterior mediastinum, similar to the prior examination and favored to represent residual thymic tissue.  Although this examination was not tailored for the evaluation of the pulmonary arteries, there are no discrete filling defects within the central pulmonary arterial tree to suggest central pulmonary embolism. Normal caliber the main pulmonary artery.  -------------------------------------------------------------  Thoracic aortic measurements:  Sinotubular junction  31 mm as measured in greatest oblique coronal dimension.  Proximal ascending aorta  35 mm as measured in greatest oblique axial dimension at the level of the main pulmonary artery. And approximately 36 mm in greatest oblique coronal dimension (image 49, series 502).  Aortic arch aorta  26 mm as measured in  greatest oblique coronal dimension (image 57, series 502).  Proximal descending thoracic aorta  37 mm as measured in greatest oblique axial dimension at the level of the main pulmonary artery.  Distal descending thoracic aorta  27 mm as measured in greatest oblique axial dimension at the level of the diaphragmatic hiatus.  Review of  the MIP images confirms the above findings.  -------------------------------------------------------------  Non-Vascular Findings of the chest:  Minimal bibasilar dependent ground-glass atelectasis. No discrete focal airspace opacities. No pleural effusion or pneumothorax. The central pulmonary airways appear widely patent.  No discrete pulmonary nodules. No mediastinal, hilar or axillary lymphadenopathy.  No acute or aggressive osseus abnormalities within the chest. Regional soft tissues appear normal.  ---------------------------------------------------------------  Vascular Findings of the abdomen and pelvis:  Abdominal aorta: There is a moderate amount of eccentric mixed calcified and noncalcified atherosclerotic plaque throughout the normal caliber abdominal aorta, not resulting in a hemodynamically significant stenosis. No abdominal aortic dissection or periaortic stranding.  Celiac artery: There is grossly unchanged eccentric slightly irregular noncalcified atherosclerotic plaque involving the origin the celiac artery, likely resulting in approximately 50% luminal narrowing. The left gastric artery is initially noted to supine an accessory left hepatic artery. Otherwise, conventional branching pattern.  SMA: Widely patent. Conventional branching pattern. The distal tributaries of the SMA appear widely patent throughout their imaged course.  Right Renal artery: Solitary ; there is a minimal amount of eccentric mixed calcified and noncalcified atherosclerotic plaque involving the caudal proximal aspect of the right renal artery, not resulting in hemodynamically significant stenosis.  Left Renal artery: Solitary with early bifurcation. Widely patent without hemodynamically significant stenosis.  IMA: Widely patent without hemodynamically significant stenosis.  Pelvic vasculature: There is a minimal amount of eccentric mixed calcified and noncalcified atherosclerotic plaque throughout the bilateral common iliac  arteries, not resulting in hemodynamically significant stenosis. The bilateral internal iliac arteries are mildly disease, left greater than right, though patent and of normal caliber. The bilateral external iliac arteries are widely patent without hemodynamically significant narrowing. There is a minimal amount of eccentric mixed calcified and noncalcified atherosclerotic plaque within the bilateral common femoral arteries, not resulting in hemodynamically significant stenosis.  Review of the MIP images confirms the above findings.   --------------------------------------------------------------------------------  Nonvascular Findings of the abdomen and pelvis:  Re- demonstrated sequela of polycystic kidney disease with multiple hepatic cysts also re- demonstrated. Normal hepatic contour. Normal appearance of the gallbladder given underdistention. No radiopaque gallstones. No intra or extrahepatic biliary duct dilatation. No ascites.  There is symmetric enhancement of the bilateral kidneys. No definite urinary obstruction. No perinephric stranding. Normal appearance of the bilateral adrenal glands, pancreas and spleen.  The bowel is normal in course and caliber without wall thickening or evidence of obstruction. Normal appearance of the appendix. No pneumoperitoneum, pneumatosis or portal venous gas.  No retroperitoneal, mesenteric, pelvic or inguinal lymphadenopathy.  There is heterogeneous enhancement of the uterus with a suspected approximately 4.3 x 4.3 cm heterogeneously enhancing fibroid within the anterior aspect of the uterus (image 244, series 501). Otherwise, normal appearance of the pelvic organs. Normal appearance of the urinary bladder given degree distention. No free fluid in the pole the cul-de-sac.  No acute or aggressive osseous abnormalities.  Regional soft tissues appear normal.  Review of the MIP images confirms the above findings.  IMPRESSION: Vascular Impression of the chest:  1. Stable  sequela of endovascular repair of the descending thoracic aorta without evidence of complication. Specifically, no evidence of endoleak or evidence  of recurrent dissection. 2. Normal caliber and appearance of the ascending thoracic aorta. 3. Coronary artery calcifications. Nonvascular Impression of the chest:  1. No acute cardiopulmonary disease. ---------------------------------------------------------------  Vascular Impression of the abdomen and pelvis:  1. Moderate amount of eccentric mixed calcified and noncalcified atherosclerotic plaque scattered within a normal caliber abdominal aorta, not resulting in a hemodynamically significant stenosis. No abdominal aortic dissection or periaortic stranding. 2. Eccentric noncalcified atherosclerotic plaque involving the origin the celiac artery likely results in approximately 50% luminal narrowing, grossly unchanged. Nonvascular Impression of the abdomen and pelvis:  1. No acute findings within the abdomen or pelvis. 2. Similar findings of polycystic kidney disease and multiple hepatic cysts. 3. Myomatous uterus including an approximately 4.3 cm heterogeneously enhancing fibroid within the anterior wall of the uterus.   Electronically Signed   By: Simonne Come M.D.   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   1. Chest pain  - CT of chest, abdomen and pelvis shows no acute finding  - pending myoview today, would like to avoid cath if possible given h/o type B dissection and CKD  2. H/o repair of dissecting aneurysm of descending thoracic aortia in 08/2014  3. Spinal cord ischemia w/ LE paresthesia: wheelchair bound 4. HTN 5. CKD, stage III 6. Tobacco abuse  Signed, Azalee Course  PA-C Pager: [phone] Patient seen and examined. I agree with the assessment and plan as detailed above. Stress myoview is normal. No further ischemic evaluation is necessary. Smoking cessation advised. She can f/u with Dr. Patty Sermons.   Brittyn Salaz 4:18 PM 01/17/2015

## 2015-01-17 NOTE — Progress Notes (Addendum)
ANTICOAGULATION CONSULT NOTE - Follow Up Consult  Pharmacy Consult for heparin Indication: chest pain/ACS  Labs:  Recent Labs  01/16/15 1315 01/16/15 1716 01/16/15 2250 01/17/15 0400  HGB 13.4  --   --  12.0  HCT 40.1  --   --  38.2  PLT 208  --   --  240  HEPARINUNFRC  --   --   --  0.30  CREATININE 1.41*  --   --   --   TROPONINI  --  <0.03 <0.03  --     Assessment: 49yo female therapeutic on heparin with initial dosing for CP though at very low end of goal.  Goal of Therapy:  Heparin level 0.3-0.7 units/ml   Plan:  Will increase heparin gtt slightly to 750 units/hr and check level in 8hr.  Vernard GamblesVeronda Kemisha Bonnette, PharmD, BCPS  01/17/2015,5:43 AM

## 2015-01-17 NOTE — Progress Notes (Signed)
Echocardiogram 2D Echocardiogram has been performed.  Charlotte [name], Charlotte [name] M 01/17/2015, 9:14 AM

## 2015-01-17 NOTE — Progress Notes (Signed)
Pt complaining of left sided chest pain irradiating to the back, severity 8/10. Pt alert and oriented in no distress BP: 110/61 P: 70 Cardiology PA notified and  EKG obtained. Order given for STAT troponin level.  Colleen Canesar Kellsey Sansone, RN

## 2015-01-17 NOTE — Progress Notes (Signed)
ANTICOAGULATION CONSULT NOTE - Follow Up Consult  Pharmacy Consult for Heparin Indication: chest pain/ACS  Allergies  Allergen Reactions  . Seroquel [Quetiapine] Other (See Comments)    Night sweats, dizziness and possible confusion  . Olanzapine Nausea And Vomiting    Patient Measurements: Height: 5\' 6"  (167.6 cm) Weight: 134 lb (60.782 kg) IBW/kg (Calculated) : 59.3 Heparin Dosing Weight: 60 kg  Vital Signs: Temp: 97.6 F (36.4 C) (02/26 0753) Temp Source: Oral (02/26 0753) BP: 131/69 mmHg (02/26 1408) Pulse Rate: 87 (02/26 1408)  Labs:  Recent Labs  01/16/15 1315  01/16/15 2250 01/17/15 0400 01/17/15 1412  HGB 13.4  --   --  12.0  --   HCT 40.1  --   --  38.2  --   PLT 208  --   --  240  --   HEPARINUNFRC  --   --   --  0.30 0.42  CREATININE 1.41*  --   --   --   --   TROPONINI  --   < > <0.03 0.05* <0.03  < > = values in this interval not displayed.  Estimated Creatinine Clearance: 44.7 mL/min (by C-G formula based on Cr of 1.41).   Medications:  Scheduled:  . aspirin  81 mg Oral Daily  . atorvastatin  20 mg Oral q1800  . famotidine  20 mg Oral BID  . feeding supplement (ENSURE COMPLETE)  237 mL Oral TID BM  . gabapentin  200 mg Oral TID  . hydrALAZINE  100 mg Oral BID  . lisinopril  20 mg Oral Daily   And  . hydrochlorothiazide  12.5 mg Oral Daily  . ipratropium-albuterol  3 mL Nebulization TID  . labetalol  300 mg Oral BID  . nicotine  14 mg Transdermal Daily  . nitroGLYCERIN  1 inch Topical 3 times per day  . oxyCODONE  10 mg Oral Daily  . pantoprazole  40 mg Oral BID AC  . polyethylene glycol  17 g Oral QPC supper  . potassium chloride SA  20 mEq Oral BID  . sertraline  100 mg Oral Daily  . sodium chloride  3 mL Intravenous Q12H   Infusions:  . heparin 750 Units/hr (01/17/15 [id number])    Assessment: [age] yo F presented to ED 2/25 with chest pain.  Pt is s/p myoview which showed low risk.  Continues on heparin pending further cardiac plans.   Heparin is therapeutic on 750 units/hr.  Goal of Therapy:  Heparin level 0.3-0.7 units/ml Monitor platelets by anticoagulation protocol: Yes   Plan:  Continue heparin at 750 units/hr. Heparin level and CBC daily while on heparin.  Toys 'R' UsKimberly Vaneta Hammontree, Pharm.D., BCPS Clinical Pharmacist Pager [phone] 01/17/2015 3:34 PM

## 2015-01-17 NOTE — Progress Notes (Signed)
UR Completed Daulton Harbaugh Graves-Bigelow, RN,BSN [phone]  

## 2015-01-17 NOTE — Progress Notes (Signed)
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 Following discharge from the hospital she has had 2 ED visits in November and December 2015 for chest pain, shortness of breath, hyperventilation and anxiety and ruled out for aortic dissection with CT angiogram. Patient also has history of stroke with lower extremity weakness, chronic kidney disease stage II, ongoing tobacco use, hypertension, arthritis, bipolar disorder. At baseline patient is wheelchair bound since having paraparesis last year.  She presented with she experienced substernal chest pressure and burning, 10/10 radiating to her left back. She denied any headache, blurred vision, back pain, shortness of breath, diaphoresis, palpitations and rash . Reports current symptoms to be different from previous visits for chest pain symptoms.   Assessment/Plan  Chest pain initially thought to be due to unstable angina, heart score 5.  Ddx includes early shingles, GERD, possible COPD.   -  Mild rise in troponin this AM -  On heparin gtt -  Continue daily aspirin -  Continue BB and statin -  NM stress test low risk -  ECHO with normal EF, grade 1 DD -  Trial of duonebs and BID PPI with GI cocktail   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 in 08/2014 Will rule out dissection or leak with CT angiogram and chest and abdomen. If negative will start IV heparin.  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  Chronic renal insufficiency, stage III (moderate) at baseline.  Essential hypertension Continue labetalol, HCTZ, lisinopril  Tobacco abuse Continue nicotine patch.  Prolonged Qtc  Diet:  full Access:  PIV IVF:  off Proph:  heparin  Code Status: full Family Communication: patient, daughter Disposition Plan: likely home tomorrow if chest pain improves   Consultants:  Cardiology  Procedures:  ECHO  NM Stress test  CT angio chest/a/p  Antibiotics:  none   HPI/Subjective:  Patient continues to have chest pain that is a "burning sensation" that wraps around to the left side  Objective: Filed Vitals:   01/17/15 1218 01/17/15 1220 01/17/15 1222 01/17/15 1408  BP: 104/50 133/67 133/57 131/69  Pulse: 89 137 122 87  Temp:      TempSrc:      Resp:      Height:      Weight:      SpO2:        Intake/Output Summary (Last 24 hours) at 01/17/15 1458 Last data filed at 01/17/15 0750  Gross per 24 hour  Intake    237 ml  Output      0 ml  Net    237 ml   Filed Weights   01/16/15 1515 01/16/15 1702 01/17/15 0432  Weight: 57.2 kg (126 lb 1.7 oz) 56.881 kg (125 lb 6.4 oz) 60.782 kg (134 lb)    Exam:   General:  BF, No acute distress  HEENT:  NCAT, MMM  Cardiovascular:  RRR, nl S1, S2 no mrg, 2+ pulses, warm extremities  Respiratory:  CTAB, no increased WOB  Abdomen:   NABS, soft, NT/ND  MSK:  Normal tone and bulk, no LEE  Neuro:  Grossly intact  Skin:  No rash under left breast, left abdominal wall or flank.    Data Reviewed: Basic Metabolic Panel:  Recent Labs Lab 01/16/15 1315 01/16/15 2143  NA 139  --   K 3.6  --   CL 108  --   CO2 24  --   GLUCOSE 119*  --   BUN 15  --   CREATININE 1.41*  --   CALCIUM 9.4  --   MG  --  2.0   Liver Function Tests: No results for input(s): AST, ALT, ALKPHOS, BILITOT, PROT, ALBUMIN in the last 168 hours. No results for input(s): LIPASE, AMYLASE in the  last 168 hours. No results for input(s): AMMONIA in the last 168 hours. CBC:  Recent Labs Lab 01/16/15 1315 01/17/15 0400  WBC 7.5 6.9  HGB 13.4 12.0  HCT 40.1 38.2  MCV 85.0 86.0  PLT 208 240   Cardiac Enzymes:  Recent Labs Lab 01/16/15 1716 01/16/15 2250 01/17/15 0400  TROPONINI <0.03 <0.03 0.05*   BNP (last 3 results)  Recent Labs  01/16/15 1315  BNP 69.4    ProBNP (last 3 results)  Recent Labs  08/01/14 1035 08/22/14 1350  PROBNP 300.2* 386.7*    CBG: No results for input(s): GLUCAP in the last 168 hours.  Recent Results (from the past 240 hour(s))  MRSA PCR Screening     Status: None   Collection Time: 01/16/15  5:03 PM  Result Value Ref Range Status   MRSA by PCR NEGATIVE NEGATIVE Final    Comment:        The GeneXpert MRSA Assay (FDA approved for NASAL specimens only), is one component of a comprehensive MRSA colonization surveillance program. It is not intended to diagnose MRSA infection nor to guide or monitor treatment for MRSA infections.      Studies: Nm Myocar Multi W/spect W/wall Motion / Ef  01/17/2015   CLINICAL DATA:  Chest pain.  History of stroke.  EXAM: MYOCARDIAL IMAGING WITH SPECT (REST AND PHARMACOLOGIC-STRESS)  GATED LEFT VENTRICULAR WALL MOTION STUDY  LEFT VENTRICULAR EJECTION FRACTION  TECHNIQUE: Standard myocardial SPECT imaging was performed after resting intravenous injection of 10 mCi Tc-52m sestamibi. Subsequently, intravenous infusion of Lexiscan was performed under the supervision of the Cardiology staff. At peak effect of the drug, 30 mCi Tc-3m sestamibi was injected intravenously and standard myocardial SPECT imaging was performed. Quantitative gated imaging was also performed to evaluate left ventricular wall motion, and estimate left ventricular ejection fraction.  COMPARISON:  None.  FINDINGS: Perfusion: No decreased activity in the left ventricle on stress imaging to suggest reversible ischemia or infarction.  Diaphragmatic attenuation of the inferior wall.  Wall Motion: Normal left ventricular wall motion. No left ventricular dilation.  Left Ventricular Ejection Fraction: 75 %  End diastolic volume 79 ml  End systolic volume 20 ml  IMPRESSION: 1. No reversible ischemia or infarction. Diaphragmatic attenuation of the inferior wall  2. Normal left ventricular wall motion.  3. Left ventricular ejection fraction 75%  4. Low-risk stress test findings*.  *2012 Appropriate Use Criteria for Coronary Revascularization Focused Update: J Am Coll Cardiol. 2012;59(9):857-881. http://content.dementiazones.com.aspx?articleid=[email or web address]   Electronically Signed   By: Rudie Meyer M.D.   On: 01/17/2015 14:44   Dg Chest Port 1 View  01/16/2015   CLINICAL DATA:  Acute chest pain.  EXAM: PORTABLE CHEST - 1 VIEW  COMPARISON:  October 10, 2014.  FINDINGS: Stable cardiomediastinal silhouette. Stable  presence of stent graft in the descending thoracic aorta. No pneumothorax or pleural effusion is noted. No acute pulmonary disease is noted. Bony thorax is intact.  IMPRESSION: No acute cardiopulmonary abnormality seen.   Electronically Signed   By: Lupita Raider, M.D.   On: 01/16/2015 14:36   Ct Angio Chest Aorta W/cm &/or Wo/cm  01/16/2015   CLINICAL DATA:  History of dissection (8/25) post aortic stent placement, now with chest pain and shortness of breath for 1 day. History of smoking, hypertension, polycystic kidney disease.  EXAM: CT ANGIOGRAPHY CHEST, ABDOMEN AND PELVIS  TECHNIQUE: Multidetector CT imaging through the chest, abdomen and pelvis was performed using the standard protocol during bolus administration of intravenous contrast. Multiplanar reconstructed images and MIPs were obtained and reviewed to evaluate the vascular anatomy.  CONTRAST:  OMNIPAQUE IOHEXOL 350 MG/ML SOLN  COMPARISON:  CT of the chest, abdomen and pelvis - 11/17/2014  FINDINGS: Vascular Findings of the chest:  Stable sequela of endovascular repair  of descending thoracic aortic dissection. Review of the precontrast images are negative for the presence of an intramural hematoma. The anterior aspect of the proximal end of the stent graft again is noted to project into the lumen of the aortic arch (sagittal image 87, series 503), unchanged. Otherwise, the proximal end of the stent graft is well apposed against the walls of the distal aspect of the aortic arch wall the distal end of the stent graft is well apposed against the walls of the distal descending thoracic aorta. The stent graft remains widely patent.  The excluded descending thoracic aorta is well apposed against the walls of the stent graft. No evidence of endoleak. No definite thoracic aortic dissection or periaortic stranding.  Normal heart size. Trace amount of pericardial fluid, presumably physiologic. Scattered minimal coronary artery calcifications. There is a minimal amount of ill-defined soft tissue within the anterior mediastinum, similar to the prior examination and favored to represent residual thymic tissue.  Although this examination was not tailored for the evaluation of the pulmonary arteries, there are no discrete filling defects within the central pulmonary arterial tree to suggest central pulmonary embolism. Normal caliber the main pulmonary artery.  -------------------------------------------------------------  Thoracic aortic measurements:  Sinotubular junction  31 mm as measured in greatest oblique coronal dimension.  Proximal ascending aorta  35 mm as measured in greatest oblique axial dimension at the level of the main pulmonary artery. And approximately 36 mm in greatest oblique coronal dimension (image 49, series 502).  Aortic arch aorta  26 mm as measured in greatest oblique coronal dimension (image 57, series 502).  Proximal descending thoracic aorta  37 mm as measured in greatest oblique axial dimension at the level of the main pulmonary artery.  Distal descending thoracic  aorta  27 mm as measured in greatest oblique axial dimension at the level of the diaphragmatic hiatus.  Review of the MIP images confirms the above findings.  -------------------------------------------------------------  Non-Vascular Findings of the chest:  Minimal bibasilar dependent ground-glass atelectasis. No discrete focal airspace opacities. No pleural effusion or pneumothorax. The central pulmonary airways appear widely patent.  No discrete pulmonary nodules. No mediastinal, hilar or axillary lymphadenopathy.  No acute or aggressive osseus abnormalities within the chest. Regional soft tissues appear normal.  ---------------------------------------------------------------  Vascular Findings of the abdomen and pelvis:  Abdominal aorta: There is a moderate amount of eccentric mixed calcified and noncalcified atherosclerotic plaque throughout the normal caliber abdominal aorta, not resulting in a hemodynamically significant stenosis. No abdominal  aortic dissection or periaortic stranding.  Celiac artery: There is grossly unchanged eccentric slightly irregular noncalcified atherosclerotic plaque involving the origin the celiac artery, likely resulting in approximately 50% luminal narrowing. The left gastric artery is initially noted to supine an accessory left hepatic artery. Otherwise, conventional branching pattern.  SMA: Widely patent. Conventional branching pattern. The distal tributaries of the SMA appear widely patent throughout their imaged course.  Right Renal artery: Solitary ; there is a minimal amount of eccentric mixed calcified and noncalcified atherosclerotic plaque involving the caudal proximal aspect of the right renal artery, not resulting in hemodynamically significant stenosis.  Left Renal artery: Solitary with early bifurcation. Widely patent without hemodynamically significant stenosis.  IMA: Widely patent without hemodynamically significant stenosis.  Pelvic vasculature: There is a minimal  amount of eccentric mixed calcified and noncalcified atherosclerotic plaque throughout the bilateral common iliac arteries, not resulting in hemodynamically significant stenosis. The bilateral internal iliac arteries are mildly disease, left greater than right, though patent and of normal caliber. The bilateral external iliac arteries are widely patent without hemodynamically significant narrowing. There is a minimal amount of eccentric mixed calcified and noncalcified atherosclerotic plaque within the bilateral common femoral arteries, not resulting in hemodynamically significant stenosis.  Review of the MIP images confirms the above findings.   --------------------------------------------------------------------------------  Nonvascular Findings of the abdomen and pelvis:  Re- demonstrated sequela of polycystic kidney disease with multiple hepatic cysts also re- demonstrated. Normal hepatic contour. Normal appearance of the gallbladder given underdistention. No radiopaque gallstones. No intra or extrahepatic biliary duct dilatation. No ascites.  There is symmetric enhancement of the bilateral kidneys. No definite urinary obstruction. No perinephric stranding. Normal appearance of the bilateral adrenal glands, pancreas and spleen.  The bowel is normal in course and caliber without wall thickening or evidence of obstruction. Normal appearance of the appendix. No pneumoperitoneum, pneumatosis or portal venous gas.  No retroperitoneal, mesenteric, pelvic or inguinal lymphadenopathy.  There is heterogeneous enhancement of the uterus with a suspected approximately 4.3 x 4.3 cm heterogeneously enhancing fibroid within the anterior aspect of the uterus (image 244, series 501). Otherwise, normal appearance of the pelvic organs. Normal appearance of the urinary bladder given degree distention. No free fluid in the pole the cul-de-sac.  No acute or aggressive osseous abnormalities.  Regional soft tissues appear normal.   Review of the MIP images confirms the above findings.  IMPRESSION: Vascular Impression of the chest:  1. Stable sequela of endovascular repair of the descending thoracic aorta without evidence of complication. Specifically, no evidence of endoleak or evidence of recurrent dissection. 2. Normal caliber and appearance of the ascending thoracic aorta. 3. Coronary artery calcifications. Nonvascular Impression of the chest:  1. No acute cardiopulmonary disease. ---------------------------------------------------------------  Vascular Impression of the abdomen and pelvis:  1. Moderate amount of eccentric mixed calcified and noncalcified atherosclerotic plaque scattered within a normal caliber abdominal aorta, not resulting in a hemodynamically significant stenosis. No abdominal aortic dissection or periaortic stranding. 2. Eccentric noncalcified atherosclerotic plaque involving the origin the celiac artery likely results in approximately 50% luminal narrowing, grossly unchanged. Nonvascular Impression of the abdomen and pelvis:  1. No acute findings within the abdomen or pelvis. 2. Similar findings of polycystic kidney disease and multiple hepatic cysts. 3. Myomatous uterus including an approximately 4.3 cm heterogeneously enhancing fibroid within the anterior wall of the uterus.   Electronically Signed   By: Simonne Come M.D.   On: 01/16/2015 17:03   Ct Angio Abd/pel W/ And/or W/o  01/16/2015  CLINICAL DATA:  History of dissection (8/25) post aortic stent placement, now with chest pain and shortness of breath for 1 day. History of smoking, hypertension, polycystic kidney disease.  EXAM: CT ANGIOGRAPHY CHEST, ABDOMEN AND PELVIS  TECHNIQUE: Multidetector CT imaging through the chest, abdomen and pelvis was performed using the standard protocol during bolus administration of intravenous contrast. Multiplanar reconstructed images and MIPs were obtained and reviewed to evaluate the vascular anatomy.  CONTRAST:   OMNIPAQUE IOHEXOL 350 MG/ML SOLN  COMPARISON:  CT of the chest, abdomen and pelvis - 11/17/2014  FINDINGS: Vascular Findings of the chest:  Stable sequela of endovascular repair of descending thoracic aortic dissection. Review of the precontrast images are negative for the presence of an intramural hematoma. The anterior aspect of the proximal end of the stent graft again is noted to project into the lumen of the aortic arch (sagittal image 87, series 503), unchanged. Otherwise, the proximal end of the stent graft is well apposed against the walls of the distal aspect of the aortic arch wall the distal end of the stent graft is well apposed against the walls of the distal descending thoracic aorta. The stent graft remains widely patent.  The excluded descending thoracic aorta is well apposed against the walls of the stent graft. No evidence of endoleak. No definite thoracic aortic dissection or periaortic stranding.  Normal heart size. Trace amount of pericardial fluid, presumably physiologic. Scattered minimal coronary artery calcifications. There is a minimal amount of ill-defined soft tissue within the anterior mediastinum, similar to the prior examination and favored to represent residual thymic tissue.  Although this examination was not tailored for the evaluation of the pulmonary arteries, there are no discrete filling defects within the central pulmonary arterial tree to suggest central pulmonary embolism. Normal caliber the main pulmonary artery.  -------------------------------------------------------------  Thoracic aortic measurements:  Sinotubular junction  31 mm as measured in greatest oblique coronal dimension.  Proximal ascending aorta  35 mm as measured in greatest oblique axial dimension at the level of the main pulmonary artery. And approximately 36 mm in greatest oblique coronal dimension (image 49, series 502).  Aortic arch aorta  26 mm as measured in greatest oblique coronal dimension (image 57,  series 502).  Proximal descending thoracic aorta  37 mm as measured in greatest oblique axial dimension at the level of the main pulmonary artery.  Distal descending thoracic aorta  27 mm as measured in greatest oblique axial dimension at the level of the diaphragmatic hiatus.  Review of the MIP images confirms the above findings.  -------------------------------------------------------------  Non-Vascular Findings of the chest:  Minimal bibasilar dependent ground-glass atelectasis. No discrete focal airspace opacities. No pleural effusion or pneumothorax. The central pulmonary airways appear widely patent.  No discrete pulmonary nodules. No mediastinal, hilar or axillary lymphadenopathy.  No acute or aggressive osseus abnormalities within the chest. Regional soft tissues appear normal.  ---------------------------------------------------------------  Vascular Findings of the abdomen and pelvis:  Abdominal aorta: There is a moderate amount of eccentric mixed calcified and noncalcified atherosclerotic plaque throughout the normal caliber abdominal aorta, not resulting in a hemodynamically significant stenosis. No abdominal aortic dissection or periaortic stranding.  Celiac artery: There is grossly unchanged eccentric slightly irregular noncalcified atherosclerotic plaque involving the origin the celiac artery, likely resulting in approximately 50% luminal narrowing. The left gastric artery is initially noted to supine an accessory left hepatic artery. Otherwise, conventional branching pattern.  SMA: Widely patent. Conventional branching pattern. The distal tributaries of the SMA appear  widely patent throughout their imaged course.  Right Renal artery: Solitary ; there is a minimal amount of eccentric mixed calcified and noncalcified atherosclerotic plaque involving the caudal proximal aspect of the right renal artery, not resulting in hemodynamically significant stenosis.  Left Renal artery: Solitary with early  bifurcation. Widely patent without hemodynamically significant stenosis.  IMA: Widely patent without hemodynamically significant stenosis.  Pelvic vasculature: There is a minimal amount of eccentric mixed calcified and noncalcified atherosclerotic plaque throughout the bilateral common iliac arteries, not resulting in hemodynamically significant stenosis. The bilateral internal iliac arteries are mildly disease, left greater than right, though patent and of normal caliber. The bilateral external iliac arteries are widely patent without hemodynamically significant narrowing. There is a minimal amount of eccentric mixed calcified and noncalcified atherosclerotic plaque within the bilateral common femoral arteries, not resulting in hemodynamically significant stenosis.  Review of the MIP images confirms the above findings.   --------------------------------------------------------------------------------  Nonvascular Findings of the abdomen and pelvis:  Re- demonstrated sequela of polycystic kidney disease with multiple hepatic cysts also re- demonstrated. Normal hepatic contour. Normal appearance of the gallbladder given underdistention. No radiopaque gallstones. No intra or extrahepatic biliary duct dilatation. No ascites.  There is symmetric enhancement of the bilateral kidneys. No definite urinary obstruction. No perinephric stranding. Normal appearance of the bilateral adrenal glands, pancreas and spleen.  The bowel is normal in course and caliber without wall thickening or evidence of obstruction. Normal appearance of the appendix. No pneumoperitoneum, pneumatosis or portal venous gas.  No retroperitoneal, mesenteric, pelvic or inguinal lymphadenopathy.  There is heterogeneous enhancement of the uterus with a suspected approximately 4.3 x 4.3 cm heterogeneously enhancing fibroid within the anterior aspect of the uterus (image 244, series 501). Otherwise, normal appearance of the pelvic organs. Normal appearance  of the urinary bladder given degree distention. No free fluid in the pole the cul-de-sac.  No acute or aggressive osseous abnormalities.  Regional soft tissues appear normal.  Review of the MIP images confirms the above findings.  IMPRESSION: Vascular Impression of the chest:  1. Stable sequela of endovascular repair of the descending thoracic aorta without evidence of complication. Specifically, no evidence of endoleak or evidence of recurrent dissection. 2. Normal caliber and appearance of the ascending thoracic aorta. 3. Coronary artery calcifications. Nonvascular Impression of the chest:  1. No acute cardiopulmonary disease. ---------------------------------------------------------------  Vascular Impression of the abdomen and pelvis:  1. Moderate amount of eccentric mixed calcified and noncalcified atherosclerotic plaque scattered within a normal caliber abdominal aorta, not resulting in a hemodynamically significant stenosis. No abdominal aortic dissection or periaortic stranding. 2. Eccentric noncalcified atherosclerotic plaque involving the origin the celiac artery likely results in approximately 50% luminal narrowing, grossly unchanged. Nonvascular Impression of the abdomen and pelvis:  1. No acute findings within the abdomen or pelvis. 2. Similar findings of polycystic kidney disease and multiple hepatic cysts. 3. Myomatous uterus including an approximately 4.3 cm heterogeneously enhancing fibroid within the anterior wall of the uterus.   Electronically Signed   By: Simonne Come M.D.   On: 01/16/2015 17:03    Scheduled Meds: . aspirin  81 mg Oral Daily  . atorvastatin  20 mg Oral q1800  . famotidine  20 mg Oral BID  . feeding supplement (ENSURE COMPLETE)  237 mL Oral TID BM  . gabapentin  200 mg Oral TID  . hydrALAZINE  100 mg Oral BID  . lisinopril  20 mg Oral Daily   And  . hydrochlorothiazide  12.5 mg  Oral Daily  . labetalol  300 mg Oral BID  . nicotine  14 mg Transdermal Daily  .  nitroGLYCERIN  1 inch Topical 3 times per day  . oxyCODONE  10 mg Oral Daily  . polyethylene glycol  17 g Oral QPC supper  . potassium chloride SA  20 mEq Oral BID  . sertraline  100 mg Oral Daily  . sodium chloride  3 mL Intravenous Q12H   Continuous Infusions: . heparin 750 Units/hr (01/17/15 1610)    Principal Problem:   Unstable angina Active Problems:   TOBACCO ABUSE   Spinal cord ischemia causing lower extremity paraparesis   Chronic renal insufficiency, stage III (moderate)   Essential hypertension   Chest pain   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   H/O: CVA (cerebrovascular accident)    Time spent: 30 min    Dezerae Freiberger, [facility]  [facility] Pager [phone]. If 7PM-7AM, please contact night-coverage at [email or web address], password [facility] 01/17/2015, 2:58 PM  LOS: 1 day

## 2015-01-17 NOTE — Care Management Note (Addendum)
    Page 1 of 2   01/20/2015     10:20:30 AM CARE MANAGEMENT NOTE 01/20/2015  Patient:  Charlotte [name],Charlotte [name]   Account Number:  [id number]  Date Initiated:  01/17/2015  Documentation initiated by:  GRAVES-BIGELOW,Alleya Demeter  Subjective/Objective Assessment:   Pt admitted for cp. Pt is from home and plans to return home with daughter.     Action/Plan:   Referral received for RW, Hoyer lift and medication assistance. CM unable to assist with medications at this time due to pt has insurance and cost should be between $1.50-$3.00.   Anticipated DC Date:  01/18/2015   Anticipated DC Plan:  HOME W HOME HEALTH SERVICES      DC Planning Services  CM consult      PAC Choice  DURABLE MEDICAL EQUIPMENT  HOME HEALTH   Choice offered to / List presented to:  C-4 Adult Children   DME arranged  WALKER - ROLLING  OTHER - SEE COMMENT      DME agency  [facility]     [facility]  HH-1 RN  HH-10 DISEASE MANAGEMENT      HH agency  [facility]   Status of service:  Completed, signed off Medicare Important Message given?  NO (If response is "NO", the following Medicare IM given date fields will be blank) Date Medicare IM given:   Medicare IM given by:   Date Additional Medicare IM given:   Additional Medicare IM given by:    Discharge Disposition:  HOME W HOME HEALTH SERVICES  Per UR Regulation:  Reviewed for med. necessity/level of care/duration of stay  If discussed at Long Length of Stay Meetings, dates discussed:    Comments:   01-20-15 [address] Tomi BambergerBrenda Graves-Bigelow, [place] [phone] Plan for pt to transfer home via ambulance. [facility] and pt already has RW rollator at home. Will deliver Michiel SitesHoyer to pt's daughters home.  01-17-15 1632 Tomi BambergerBrenda Graves-Bigelow, RN,BSN [phone] CM did speak with daughter Charlotte [name], Pt will be d/c to daughters home [address], [place], [place] [id number]. CM did make pt and daughter aware that PCP will have to fill out  paperwork for motorized wheelchair. [facility] and will be delivered to room. Hoyer lift to be delivered to home. [facility] and [facility] to begin within 24-48 hours post d/c. Unable to receive PT services at this time. [facility] stated they would look at notes to see if pt has qualifying diagnosis. No further needs from CM at this time.  01-17-15 1521 Tomi BambergerBrenda Graves-Bigelow, RN,BSN [phone] CM did speak with pt and she asked me to come back and speak with daughter in [facility].

## 2015-01-17 NOTE — Progress Notes (Signed)
NUTRITION BRIEF NOTE  Pt identified due to Malnutrition Screening Tool  Wt Readings from Last 10 Encounters:  01/17/15 134 lb (60.782 kg)  11/07/14 126 lb (57.153 kg)  08/25/14 145 lb 15.1 oz (66.2 kg)  08/07/14 140 lb (63.504 kg)  08/01/14 140 lb (63.504 kg)  07/30/14 141 lb 15.6 oz (64.4 kg)  07/24/14 140 lb 4.8 oz (63.64 kg)  02/09/11 131 lb 8 oz (59.648 kg)   Body mass index is 21.64 kg/(m^2). Pt meets criteria for Normal based on BMI.  Current diet order is Regular. Pt reported feeling hungry and denied nausea, vomiting or abdominal pain. Labs and medications reviewed.   Pt is currently receiving Ensure TID.   No nutrition interventions warranted at this time. Please consult RD if nutrition issues arise.  Cristela FeltElisa Caroline Longie, MS Dietetic Intern Pager: [phone]

## 2015-01-18 DIAGNOSIS — N183 Chronic kidney disease, stage 3 unspecified: Secondary | ICD-10-CM

## 2015-01-18 DIAGNOSIS — N179 Acute kidney failure, unspecified: Secondary | ICD-10-CM

## 2015-01-18 LAB — CREATININE, URINE, RANDOM: Creatinine, Urine: 142.75 mg/dL

## 2015-01-18 LAB — CBC
HCT: 32.5 % — ABNORMAL LOW (ref 36.0–46.0)
HEMOGLOBIN: 10.4 g/dL — AB (ref 12.0–15.0)
MCH: 27.7 pg (ref 26.0–34.0)
MCHC: 32 g/dL (ref 30.0–36.0)
MCV: 86.7 fL (ref 78.0–100.0)
Platelets: 229 10*3/uL (ref 150–400)
RBC: 3.75 MIL/uL — AB (ref 3.87–5.11)
RDW: 16.1 % — AB (ref 11.5–15.5)
WBC: 4.9 10*3/uL (ref 4.0–10.5)

## 2015-01-18 LAB — IRON AND TIBC
Iron: 75 ug/dL (ref 42–145)
Saturation Ratios: 42 % (ref 20–55)
TIBC: 180 ug/dL — AB (ref 250–470)
UIBC: 105 ug/dL — AB (ref 125–400)

## 2015-01-18 LAB — BASIC METABOLIC PANEL
Anion gap: 7 (ref 5–15)
BUN: 25 mg/dL — ABNORMAL HIGH (ref 6–23)
CHLORIDE: 108 mmol/L (ref 96–112)
CO2: 21 mmol/L (ref 19–32)
CREATININE: 1.91 mg/dL — AB (ref 0.50–1.10)
Calcium: 8.7 mg/dL (ref 8.4–10.5)
GFR calc Af Amer: 34 mL/min — ABNORMAL LOW (ref >90)
GFR, EST NON AFRICAN AMERICAN: 30 mL/min — AB (ref >90)
GLUCOSE: 94 mg/dL (ref 70–99)
Potassium: 4.4 mmol/L (ref 3.5–5.1)
SODIUM: 136 mmol/L (ref 135–145)

## 2015-01-18 LAB — URINALYSIS, ROUTINE W REFLEX MICROSCOPIC
Bilirubin Urine: NEGATIVE
GLUCOSE, UA: NEGATIVE mg/dL
Hgb urine dipstick: NEGATIVE
Ketones, ur: NEGATIVE mg/dL
Leukocytes, UA: NEGATIVE
Nitrite: NEGATIVE
PH: 5 (ref 5.0–8.0)
PROTEIN: NEGATIVE mg/dL
SPECIFIC GRAVITY, URINE: 1.027 (ref 1.005–1.030)
UROBILINOGEN UA: 0.2 mg/dL (ref 0.0–1.0)

## 2015-01-18 LAB — SODIUM, URINE, RANDOM: Sodium, Ur: 52 mmol/L

## 2015-01-18 LAB — TSH: TSH: 0.484 u[IU]/mL (ref 0.350–4.500)

## 2015-01-18 MED ORDER — GABAPENTIN 100 MG PO CAPS
200.0000 mg | ORAL_CAPSULE | Freq: Two times a day (BID) | ORAL | Status: DC
  Administered 2015-01-18 – 2015-01-20 (×5): 200 mg via ORAL
  Filled 2015-01-18 (×5): qty 2

## 2015-01-18 MED ORDER — SODIUM CHLORIDE 0.9 % IV SOLN
INTRAVENOUS | Status: DC
  Administered 2015-01-18: 75 mL/h via INTRAVENOUS
  Administered 2015-01-20: 1000 mL via INTRAVENOUS

## 2015-01-18 NOTE — Care Management (Signed)
CM met with patient at bedside reviewed disposition plan. Patient has had 4 ED visits and 4 hospitalization in the past 6 months.  [place], DME  hoyer lift to be deliver to daughter's home, spoke with Jeneen Rinks in [facility]- will deliver rolling walker to patient's room today prior to d/c.  Scheduled f/u visit with PCP at [facility] 2/29 at 2:15p. Patient and daughter verbalized understanding and agreeable with disposition plan, teach back done. Updated Marcie Bal RN on disposition plan once medically cleared

## 2015-01-18 NOTE — Progress Notes (Addendum)
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 Following discharge from the hospital she has had 2 ED visits in November and December 2015 for chest pain, shortness of breath, hyperventilation and anxiety and ruled out for aortic dissection with CT angiogram. Patient also has history of stroke with lower extremity weakness, chronic kidney disease stage II, ongoing tobacco use, hypertension, arthritis, bipolar disorder. At baseline patient is wheelchair bound since having paraparesis last year.  She presented with she experienced substernal chest pressure and burning, 10/10 radiating to her left back. She denied any headache, blurred vision, back pain, shortness of breath, diaphoresis, palpitations and rash . Reports current symptoms to be different from previous visits for chest pain symptoms.   Assessment/Plan  Chest pain initially thought to be due to unstable angina, heart score 5.  Ddx includes early shingles, GERD, possible COPD.   -  Troponin trended down -  Heparin gtt off -  Continue daily aspirin -  Continue BB and statin -  NM stress test low risk -  ECHO with normal EF, grade 1 DD -  Trial of duonebs and BID PPI with GI cocktail   Mild hypotension in patient with essential hypertension, ddx includes dehydration, hemorrhage (given decreased hemoglobin) -  IVF -  Repeat hgb in AM  -  Monitor for signs of bleeding -  D/c HCTZ, hydralazine, ACEI -  Hold parameters placed  on labetalol -  Monitor for fevers/signs of infection  Acute on Chronic renal insufficiency, stage III (moderate) -  UA and FENa -  Start IVF -  Repeat BMP  Normocytic anemia, no evidence of bleeding.  If dehydrated, as suspected, would have expected hgb to rise, not fall -  Occult stool -  Iron studies, b12, folate, TSH  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 in 08/2014 No evidence of dissection or leak with CT angiogram and chest and abdomen.  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  Tobacco abuse Continue nicotine patch.  Diet:  full Access:  PIV IVF:  off Proph:  scds  Code Status: full Family Communication: patient, daughter Disposition Plan:  home once blood counts stable and AKI improving   Consultants:  Cardiology  Procedures:  ECHO  NM Stress test  CT angio chest/a/p  Antibiotics:  none   HPI/Subjective:  Feels week, somewhat lightheaded when sitting up.  Denies SOB.  Still having some chest pains  Objective: Filed Vitals:   01/18/15 1022 01/18/15 1033 01/18/15 1125 01/18/15 1132  BP: 116/62 116/62 102/57 100/56  Pulse: 88  71   Temp:   98.6 F (37 C)   TempSrc:   Oral   Resp:   18   Height:      Weight:      SpO2:   97%     Intake/Output Summary (Last 24 hours) at 01/18/15 1426 Last data filed at 01/18/15 1034  Gross per 24 hour  Intake    718 ml  Output      0 ml  Net  718 ml   Filed Weights   01/16/15 1702 01/17/15 0432 01/18/15 0430  Weight: 56.881 kg (125 lb 6.4 oz) 60.782 kg (134 lb) 59.104 kg (130 lb 4.8 oz)    Exam:   General:  BF, No acute distress  HEENT:  NCAT, MMM  Cardiovascular:  RRR, nl S1, S2 no mrg, 2+ pulses, warm extremities  Respiratory:  CTAB, no increased WOB  Abdomen:   NABS, soft, NT/ND  MSK:  Increased tone and decreased bulk bilateral lower extremities, no LEE  Neuro:  Paralysis of bilateral lower extremities. Left leg  is 2/5, right leg 3/5 strength  Data Reviewed: Basic Metabolic Panel:  Recent Labs Lab 01/16/15 1315 01/16/15 2143 01/18/15 0410  NA 139  --  136  K 3.6  --  4.4  CL 108  --  108  CO2 24  --  21  GLUCOSE 119*  --  94  BUN 15  --  25*  CREATININE 1.41*  --  1.91*  CALCIUM 9.4  --  8.7  MG  --  2.0  --    Liver Function Tests: No results for input(s): AST, ALT, ALKPHOS, BILITOT, PROT, ALBUMIN in the last 168 hours. No results for input(s): LIPASE, AMYLASE in the last 168 hours. No results for input(s): AMMONIA in the last 168 hours. CBC:  Recent Labs Lab 01/16/15 1315 01/17/15 0400 01/18/15 0410  WBC 7.5 6.9 4.9  HGB 13.4 12.0 10.4*  HCT 40.1 38.2 32.5*  MCV 85.0 86.0 86.7  PLT 208 240 229   Cardiac Enzymes:  Recent Labs Lab 01/16/15 1716 01/16/15 2250 01/17/15 0400 01/17/15 1412  TROPONINI <0.03 <0.03 0.05* <0.03   BNP (last 3 results)  Recent Labs  01/16/15 1315  BNP 69.4    ProBNP (last 3 results)  Recent Labs  08/01/14 1035 08/22/14 1350  PROBNP 300.2* 386.7*    CBG: No results for input(s): GLUCAP in the last 168 hours.  Recent Results (from the past 240 hour(s))  MRSA PCR Screening     Status: None   Collection Time: 01/16/15  5:03 PM  Result Value Ref Range Status   MRSA by PCR NEGATIVE NEGATIVE Final    Comment:        The GeneXpert MRSA Assay (FDA approved for NASAL specimens only), is one component of a comprehensive MRSA colonization surveillance program. It is not intended to diagnose MRSA infection nor to guide or monitor treatment for MRSA infections.      Studies: Nm Myocar Multi W/spect W/wall Motion / Ef  01/17/2015   CLINICAL DATA:  Chest pain.  History of stroke.  EXAM: MYOCARDIAL IMAGING WITH SPECT (REST AND PHARMACOLOGIC-STRESS)  GATED LEFT VENTRICULAR WALL MOTION STUDY  LEFT VENTRICULAR EJECTION FRACTION  TECHNIQUE: Standard myocardial SPECT imaging was performed after resting intravenous injection of 10 mCi  [id number] sestamibi. Subsequently, intravenous infusion of Lexiscan was performed under the supervision of the Cardiology staff. At peak effect of the drug, 30 mCi [id number] sestamibi was injected intravenously and standard myocardial SPECT imaging was performed. Quantitative gated imaging was also performed to evaluate left ventricular wall motion, and estimate left ventricular ejection fraction.  COMPARISON:  None.  FINDINGS: Perfusion: No decreased activity in the left ventricle on stress imaging to suggest reversible ischemia or infarction. Diaphragmatic attenuation of the inferior wall.  Wall Motion: Normal left ventricular wall motion. No left ventricular dilation.  Left Ventricular Ejection Fraction: 75 %  End diastolic volume 79 ml  End systolic volume 20 ml  IMPRESSION: 1. No reversible ischemia or infarction. Diaphragmatic attenuation of the inferior wall  2. Normal left ventricular wall motion.  3. Left ventricular ejection fraction 75%  4. Low-risk stress test findings*.  *2012 Appropriate Use Criteria for Coronary Revascularization Focused Update: J Am Coll Cardiol. 2012;59(9):857-881. http://content.dementiazones.com.aspx?articleid=[email or web address]   Electronically Signed   By: Rudie Meyer M.D.   On: 01/17/2015 14:44   Ct Angio Chest Aorta W/cm &/or Wo/cm  01/16/2015   CLINICAL DATA:  History of dissection (8/25) post aortic stent placement, now with chest pain and shortness of breath for 1 day. History of smoking, hypertension, polycystic kidney disease.  EXAM: CT ANGIOGRAPHY CHEST, ABDOMEN AND PELVIS  TECHNIQUE: Multidetector CT imaging through the chest, abdomen and pelvis was performed using the standard protocol during bolus administration of intravenous contrast. Multiplanar reconstructed images and MIPs were obtained and reviewed to evaluate the vascular anatomy.  CONTRAST:  OMNIPAQUE IOHEXOL 350 MG/ML SOLN  COMPARISON:  CT of the chest, abdomen and pelvis - 11/17/2014  FINDINGS: Vascular  Findings of the chest:  Stable sequela of endovascular repair of descending thoracic aortic dissection. Review of the precontrast images are negative for the presence of an intramural hematoma. The anterior aspect of the proximal end of the stent graft again is noted to project into the lumen of the aortic arch (sagittal image 87, series 503), unchanged. Otherwise, the proximal end of the stent graft is well apposed against the walls of the distal aspect of the aortic arch wall the distal end of the stent graft is well apposed against the walls of the distal descending thoracic aorta. The stent graft remains widely patent.  The excluded descending thoracic aorta is well apposed against the walls of the stent graft. No evidence of endoleak. No definite thoracic aortic dissection or periaortic stranding.  Normal heart size. Trace amount of pericardial fluid, presumably physiologic. Scattered minimal coronary artery calcifications. There is a minimal amount of ill-defined soft tissue within the anterior mediastinum, similar to the prior examination and favored to represent residual thymic tissue.  Although this examination was not tailored for the evaluation of the pulmonary arteries, there are no discrete filling defects within the central pulmonary arterial tree to suggest central pulmonary embolism. Normal caliber the main pulmonary artery.  -------------------------------------------------------------  Thoracic aortic measurements:  Sinotubular junction  31 mm as measured in greatest oblique coronal dimension.  Proximal ascending aorta  35 mm as measured in greatest oblique axial dimension at the level of the main pulmonary artery. And approximately 36 mm in greatest oblique coronal dimension (image 49, series 502).  Aortic arch aorta  26 mm as measured in greatest oblique coronal dimension (image 57, series 502).  Proximal descending thoracic aorta  37 mm as measured in greatest oblique axial dimension at the  level of the main pulmonary artery.  Distal descending thoracic aorta  27 mm as measured in greatest oblique axial dimension at the level of the diaphragmatic hiatus.  Review of the MIP images confirms the above findings.  -------------------------------------------------------------  Non-Vascular Findings of the chest:  Minimal bibasilar dependent ground-glass atelectasis. No discrete focal airspace opacities. No pleural effusion or pneumothorax. The central pulmonary airways appear widely patent.  No discrete pulmonary nodules. No mediastinal, hilar or axillary lymphadenopathy.  No acute or aggressive osseus abnormalities within the chest. Regional soft tissues appear normal.  ---------------------------------------------------------------  Vascular Findings of the abdomen and pelvis:  Abdominal aorta: There is a moderate amount of eccentric mixed calcified and noncalcified atherosclerotic plaque  throughout the normal caliber abdominal aorta, not resulting in a hemodynamically significant stenosis. No abdominal aortic dissection or periaortic stranding.  Celiac artery: There is grossly unchanged eccentric slightly irregular noncalcified atherosclerotic plaque involving the origin the celiac artery, likely resulting in approximately 50% luminal narrowing. The left gastric artery is initially noted to supine an accessory left hepatic artery. Otherwise, conventional branching pattern.  SMA: Widely patent. Conventional branching pattern. The distal tributaries of the SMA appear widely patent throughout their imaged course.  Right Renal artery: Solitary ; there is a minimal amount of eccentric mixed calcified and noncalcified atherosclerotic plaque involving the caudal proximal aspect of the right renal artery, not resulting in hemodynamically significant stenosis.  Left Renal artery: Solitary with early bifurcation. Widely patent without hemodynamically significant stenosis.  IMA: Widely patent without hemodynamically  significant stenosis.  Pelvic vasculature: There is a minimal amount of eccentric mixed calcified and noncalcified atherosclerotic plaque throughout the bilateral common iliac arteries, not resulting in hemodynamically significant stenosis. The bilateral internal iliac arteries are mildly disease, left greater than right, though patent and of normal caliber. The bilateral external iliac arteries are widely patent without hemodynamically significant narrowing. There is a minimal amount of eccentric mixed calcified and noncalcified atherosclerotic plaque within the bilateral common femoral arteries, not resulting in hemodynamically significant stenosis.  Review of the MIP images confirms the above findings.   --------------------------------------------------------------------------------  Nonvascular Findings of the abdomen and pelvis:  Re- demonstrated sequela of polycystic kidney disease with multiple hepatic cysts also re- demonstrated. Normal hepatic contour. Normal appearance of the gallbladder given underdistention. No radiopaque gallstones. No intra or extrahepatic biliary duct dilatation. No ascites.  There is symmetric enhancement of the bilateral kidneys. No definite urinary obstruction. No perinephric stranding. Normal appearance of the bilateral adrenal glands, pancreas and spleen.  The bowel is normal in course and caliber without wall thickening or evidence of obstruction. Normal appearance of the appendix. No pneumoperitoneum, pneumatosis or portal venous gas.  No retroperitoneal, mesenteric, pelvic or inguinal lymphadenopathy.  There is heterogeneous enhancement of the uterus with a suspected approximately 4.3 x 4.3 cm heterogeneously enhancing fibroid within the anterior aspect of the uterus (image 244, series 501). Otherwise, normal appearance of the pelvic organs. Normal appearance of the urinary bladder given degree distention. No free fluid in the pole the cul-de-sac.  No acute or aggressive  osseous abnormalities.  Regional soft tissues appear normal.  Review of the MIP images confirms the above findings.  IMPRESSION: Vascular Impression of the chest:  1. Stable sequela of endovascular repair of the descending thoracic aorta without evidence of complication. Specifically, no evidence of endoleak or evidence of recurrent dissection. 2. Normal caliber and appearance of the ascending thoracic aorta. 3. Coronary artery calcifications. Nonvascular Impression of the chest:  1. No acute cardiopulmonary disease. ---------------------------------------------------------------  Vascular Impression of the abdomen and pelvis:  1. Moderate amount of eccentric mixed calcified and noncalcified atherosclerotic plaque scattered within a normal caliber abdominal aorta, not resulting in a hemodynamically significant stenosis. No abdominal aortic dissection or periaortic stranding. 2. Eccentric noncalcified atherosclerotic plaque involving the origin the celiac artery likely results in approximately 50% luminal narrowing, grossly unchanged. Nonvascular Impression of the abdomen and pelvis:  1. No acute findings within the abdomen or pelvis. 2. Similar findings of polycystic kidney disease and multiple hepatic cysts. 3. Myomatous uterus including an approximately 4.3 cm heterogeneously enhancing fibroid within the anterior wall of the uterus.   Electronically Signed   By: Holland Commons.D.  On: 01/16/2015 17:03   Ct Angio Abd/pel W/ And/or W/o  01/16/2015   CLINICAL DATA:  History of dissection (8/25) post aortic stent placement, now with chest pain and shortness of breath for 1 day. History of smoking, hypertension, polycystic kidney disease.  EXAM: CT ANGIOGRAPHY CHEST, ABDOMEN AND PELVIS  TECHNIQUE: Multidetector CT imaging through the chest, abdomen and pelvis was performed using the standard protocol during bolus administration of intravenous contrast. Multiplanar reconstructed images and MIPs were obtained and  reviewed to evaluate the vascular anatomy.  CONTRAST:  OMNIPAQUE IOHEXOL 350 MG/ML SOLN  COMPARISON:  CT of the chest, abdomen and pelvis - 11/17/2014  FINDINGS: Vascular Findings of the chest:  Stable sequela of endovascular repair of descending thoracic aortic dissection. Review of the precontrast images are negative for the presence of an intramural hematoma. The anterior aspect of the proximal end of the stent graft again is noted to project into the lumen of the aortic arch (sagittal image 87, series 503), unchanged. Otherwise, the proximal end of the stent graft is well apposed against the walls of the distal aspect of the aortic arch wall the distal end of the stent graft is well apposed against the walls of the distal descending thoracic aorta. The stent graft remains widely patent.  The excluded descending thoracic aorta is well apposed against the walls of the stent graft. No evidence of endoleak. No definite thoracic aortic dissection or periaortic stranding.  Normal heart size. Trace amount of pericardial fluid, presumably physiologic. Scattered minimal coronary artery calcifications. There is a minimal amount of ill-defined soft tissue within the anterior mediastinum, similar to the prior examination and favored to represent residual thymic tissue.  Although this examination was not tailored for the evaluation of the pulmonary arteries, there are no discrete filling defects within the central pulmonary arterial tree to suggest central pulmonary embolism. Normal caliber the main pulmonary artery.  -------------------------------------------------------------  Thoracic aortic measurements:  Sinotubular junction  31 mm as measured in greatest oblique coronal dimension.  Proximal ascending aorta  35 mm as measured in greatest oblique axial dimension at the level of the main pulmonary artery. And approximately 36 mm in greatest oblique coronal dimension (image 49, series 502).  Aortic arch aorta  26 mm  as measured in greatest oblique coronal dimension (image 57, series 502).  Proximal descending thoracic aorta  37 mm as measured in greatest oblique axial dimension at the level of the main pulmonary artery.  Distal descending thoracic aorta  27 mm as measured in greatest oblique axial dimension at the level of the diaphragmatic hiatus.  Review of the MIP images confirms the above findings.  -------------------------------------------------------------  Non-Vascular Findings of the chest:  Minimal bibasilar dependent ground-glass atelectasis. No discrete focal airspace opacities. No pleural effusion or pneumothorax. The central pulmonary airways appear widely patent.  No discrete pulmonary nodules. No mediastinal, hilar or axillary lymphadenopathy.  No acute or aggressive osseus abnormalities within the chest. Regional soft tissues appear normal.  ---------------------------------------------------------------  Vascular Findings of the abdomen and pelvis:  Abdominal aorta: There is a moderate amount of eccentric mixed calcified and noncalcified atherosclerotic plaque throughout the normal caliber abdominal aorta, not resulting in a hemodynamically significant stenosis. No abdominal aortic dissection or periaortic stranding.  Celiac artery: There is grossly unchanged eccentric slightly irregular noncalcified atherosclerotic plaque involving the origin the celiac artery, likely resulting in approximately 50% luminal narrowing. The left gastric artery is initially noted to supine an accessory left hepatic artery. Otherwise, conventional branching  pattern.  SMA: Widely patent. Conventional branching pattern. The distal tributaries of the SMA appear widely patent throughout their imaged course.  Right Renal artery: Solitary ; there is a minimal amount of eccentric mixed calcified and noncalcified atherosclerotic plaque involving the caudal proximal aspect of the right renal artery, not resulting in hemodynamically  significant stenosis.  Left Renal artery: Solitary with early bifurcation. Widely patent without hemodynamically significant stenosis.  IMA: Widely patent without hemodynamically significant stenosis.  Pelvic vasculature: There is a minimal amount of eccentric mixed calcified and noncalcified atherosclerotic plaque throughout the bilateral common iliac arteries, not resulting in hemodynamically significant stenosis. The bilateral internal iliac arteries are mildly disease, left greater than right, though patent and of normal caliber. The bilateral external iliac arteries are widely patent without hemodynamically significant narrowing. There is a minimal amount of eccentric mixed calcified and noncalcified atherosclerotic plaque within the bilateral common femoral arteries, not resulting in hemodynamically significant stenosis.  Review of the MIP images confirms the above findings.   --------------------------------------------------------------------------------  Nonvascular Findings of the abdomen and pelvis:  Re- demonstrated sequela of polycystic kidney disease with multiple hepatic cysts also re- demonstrated. Normal hepatic contour. Normal appearance of the gallbladder given underdistention. No radiopaque gallstones. No intra or extrahepatic biliary duct dilatation. No ascites.  There is symmetric enhancement of the bilateral kidneys. No definite urinary obstruction. No perinephric stranding. Normal appearance of the bilateral adrenal glands, pancreas and spleen.  The bowel is normal in course and caliber without wall thickening or evidence of obstruction. Normal appearance of the appendix. No pneumoperitoneum, pneumatosis or portal venous gas.  No retroperitoneal, mesenteric, pelvic or inguinal lymphadenopathy.  There is heterogeneous enhancement of the uterus with a suspected approximately 4.3 x 4.3 cm heterogeneously enhancing fibroid within the anterior aspect of the uterus (image 244, series 501).  Otherwise, normal appearance of the pelvic organs. Normal appearance of the urinary bladder given degree distention. No free fluid in the pole the cul-de-sac.  No acute or aggressive osseous abnormalities.  Regional soft tissues appear normal.  Review of the MIP images confirms the above findings.  IMPRESSION: Vascular Impression of the chest:  1. Stable sequela of endovascular repair of the descending thoracic aorta without evidence of complication. Specifically, no evidence of endoleak or evidence of recurrent dissection. 2. Normal caliber and appearance of the ascending thoracic aorta. 3. Coronary artery calcifications. Nonvascular Impression of the chest:  1. No acute cardiopulmonary disease. ---------------------------------------------------------------  Vascular Impression of the abdomen and pelvis:  1. Moderate amount of eccentric mixed calcified and noncalcified atherosclerotic plaque scattered within a normal caliber abdominal aorta, not resulting in a hemodynamically significant stenosis. No abdominal aortic dissection or periaortic stranding. 2. Eccentric noncalcified atherosclerotic plaque involving the origin the celiac artery likely results in approximately 50% luminal narrowing, grossly unchanged. Nonvascular Impression of the abdomen and pelvis:  1. No acute findings within the abdomen or pelvis. 2. Similar findings of polycystic kidney disease and multiple hepatic cysts. 3. Myomatous uterus including an approximately 4.3 cm heterogeneously enhancing fibroid within the anterior wall of the uterus.   Electronically Signed   By: Simonne Come M.D.   On: 01/16/2015 17:03    Scheduled Meds: . aspirin  81 mg Oral Daily  . atorvastatin  20 mg Oral q1800  . famotidine  20 mg Oral BID  . feeding supplement (ENSURE COMPLETE)  237 mL Oral TID BM  . gabapentin  200 mg Oral BID  . hydrALAZINE  100 mg Oral BID  .  hydrochlorothiazide  12.5 mg Oral Daily  . ipratropium-albuterol  3 mL Nebulization TID  .  labetalol  300 mg Oral BID  . nicotine  14 mg Transdermal Daily  . nitroGLYCERIN  1 inch Topical 3 times per day  . oxyCODONE  10 mg Oral Daily  . pantoprazole  40 mg Oral BID AC  . polyethylene glycol  17 g Oral QPC supper  . sertraline  100 mg Oral Daily  . sodium chloride  3 mL Intravenous Q12H   Continuous Infusions: . sodium chloride 75 mL/hr (01/18/15 0857)    Principal Problem:   Unstable angina Active Problems:   TOBACCO ABUSE   Spinal cord ischemia causing lower extremity paraparesis   Chronic renal insufficiency, stage III (moderate)   Essential hypertension   Chest pain   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   H/O: CVA (cerebrovascular accident)   Pain in the chest    Time spent: 30 min    Naje Rice, [facility]  [facility] Pager [phone]. If 7PM-7AM, please contact [email or web address] at [email or web address], password [facility] 01/18/2015, 2:26 PM  LOS: 2 days

## 2015-01-19 DIAGNOSIS — N183 Chronic kidney disease, stage 3 (moderate): Secondary | ICD-10-CM

## 2015-01-19 DIAGNOSIS — N179 Acute kidney failure, unspecified: Secondary | ICD-10-CM

## 2015-01-19 DIAGNOSIS — E860 Dehydration: Secondary | ICD-10-CM

## 2015-01-19 LAB — BASIC METABOLIC PANEL
ANION GAP: 7 (ref 5–15)
BUN: 22 mg/dL (ref 6–23)
CHLORIDE: 109 mmol/L (ref 96–112)
CO2: 23 mmol/L (ref 19–32)
CREATININE: 1.75 mg/dL — AB (ref 0.50–1.10)
Calcium: 8.9 mg/dL (ref 8.4–10.5)
GFR calc Af Amer: 38 mL/min — ABNORMAL LOW (ref >90)
GFR calc non Af Amer: 33 mL/min — ABNORMAL LOW (ref >90)
GLUCOSE: 96 mg/dL (ref 70–99)
POTASSIUM: 4 mmol/L (ref 3.5–5.1)
Sodium: 139 mmol/L (ref 135–145)

## 2015-01-19 LAB — CBC
HEMATOCRIT: 31.4 % — AB (ref 36.0–46.0)
Hemoglobin: 10.3 g/dL — ABNORMAL LOW (ref 12.0–15.0)
MCH: 28.1 pg (ref 26.0–34.0)
MCHC: 32.8 g/dL (ref 30.0–36.0)
MCV: 85.6 fL (ref 78.0–100.0)
Platelets: 198 10*3/uL (ref 150–400)
RBC: 3.67 MIL/uL — AB (ref 3.87–5.11)
RDW: 15.8 % — ABNORMAL HIGH (ref 11.5–15.5)
WBC: 6 10*3/uL (ref 4.0–10.5)

## 2015-01-19 LAB — FERRITIN: FERRITIN: 99 ng/mL (ref 10–291)

## 2015-01-19 LAB — VITAMIN B12: VITAMIN B 12: 392 pg/mL (ref 211–911)

## 2015-01-19 LAB — TRANSFERRIN: Transferrin: 153 mg/dL — ABNORMAL LOW (ref 200–370)

## 2015-01-19 MED ORDER — HYDRALAZINE HCL 50 MG PO TABS
50.0000 mg | ORAL_TABLET | Freq: Two times a day (BID) | ORAL | Status: DC
  Administered 2015-01-19 – 2015-01-20 (×3): 50 mg via ORAL
  Filled 2015-01-19 (×3): qty 1

## 2015-01-19 NOTE — Discharge Summary (Signed)
Physician Discharge Summary  [name] ZOX:[id number] DOB: [birth year] DOA: 01/16/2015  PCP: Doris Cheadle, MD  Admit date: 01/16/2015 Discharge date: 01/20/2015  Recommendations for Outpatient Follow-up:  1. F/u with primary care doctor in 1 week for repeat BMP to check kidney function, CBC for anemia, f/u folate.  Needs occuult stool and possible referral to GI for anemia.   2. PCP to please recheck blood pressure and resume some BP medications if needed in about one week 3. Cardiology clinic will call and notify of follow up appointment time and date 4. Set up for home health services and equipment.  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  Most notably, she was diagnosed with dissecting aneurysm of descending thoracic aorta with paraparesis of lower extremity due to spinal cord ischemia in September 2015.  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  She underwent repair with a graft stent. Following discharge from the hospital she had ED visits in November and December  2015 for chest pain, shortness of breath, hyperventilation and anxiety and each time she ruled out for aortic dissection with CT angiogram.  She lives at home and is wheelchair dependent.    She presented with substernal chest pressure and burning, 10/10, radiating to her left back. She denied headache, blurred vision, back pain, shortness of breath, diaphoresis, palpitations and rash.  She was seen by cardiology and underwent a negative NM stress test. Her course was complicated by acute kidney injury related to dehydration and progressive mild anemia.    Hospital Course:   Chest pain initially thought to be due to unstable angina, heart score 5.no evidence of shingles on exam.  Considered GERD and possible COPD, however GI cocktail and duonebs did not help much. Most likely, she had some discomfort related to dehydration since IVF improved her pain. - Troponins were generally negative except for one which was mildly positive, likely false positive - Heparin gtt was started initially but discontinued after cardiology evaluated patient - Continue daily aspirin - Continue BB and statin - NM stress test low risk - ECHO with normal EF, grade 1 DD  Mild hypotension in patient with essential hypertension, ddx includes dehydration, hemorrhage (given decreased hemoglobin) - resolved with IVF - Continue to hold HCTZ, ACEI, and hydralazine at discharge - resumed labetalol  Acute on Chronic renal insufficiency, stage III (moderate). Baseline creatinine 1.2, creatinine peaked at 1.91 and trended down to 1.44 with fluids - UA and FENa: Consistent with dehydration - Repeat BMP in 1 week  Normocytic anemia, no evidence of bleeding.  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  Tobacco abuse Continued nicotine patch.  Consultants:  Cardiology  Procedures:  ECHO  NM Stress test  CT angio chest/a/p  Antibiotics:  none  Discharge Exam: Filed Vitals:   01/20/15 0830  BP: 125/58  Pulse: 65  Temp: 98.6 F (37 C)  Resp: 16   Filed Vitals:   01/19/15 2014 01/20/15 0000 01/20/15 0400 01/20/15 0830  BP: 139/69 113/56 141/73 125/58  Pulse: 67 67 73 65  Temp: 98.8 F (37.1 C) 98.3 F (36.8 C) 99.1 F (37.3 C) 98.6 F (37 C)  TempSrc: Oral Oral Oral Oral  Resp: 18 18 18 16   Height:      Weight:   62.3 kg (137 lb 5.6 oz)   SpO2: 97% 97% 94% 95%     General: BF, No acute distress, feeling better this morning, sitting up in bed  HEENT: NCAT, MMM  Cardiovascular: RRR, nl S1, S2 no mrg, 2+ pulses, warm extremities  Respiratory: CTAB, no increased WOB  Abdomen: NABS, soft, NT/ND  MSK: Increased tone and decreased bulk bilateral lower extremities, no LEE  Neuro: Paralysis of bilateral lower extremities, able to sit up. Left leg is 2/5, right leg 3/5 strength  Discharge Instructions      Discharge Instructions    Call MD for:  difficulty breathing, headache or visual disturbances    Complete by:  As directed      Call MD for:  extreme fatigue    Complete by:  As directed      Call MD for:  hives    Complete by:  As directed      Call MD for:  persistant dizziness or light-headedness    Complete by:  As directed      Call MD for:  persistant nausea and vomiting    Complete by:  As directed      Call MD for:  severe uncontrolled pain    Complete by:  As directed      Call MD for:  temperature >100.4    Complete by:  As directed      Diet - low sodium heart healthy    Complete by:  As directed      Discharge instructions    Complete by:  As directed   You were hospitalized with chest pain.  You were found to be dehydrated.   Please drink at least 8 glasses of 8 ounces of water, juice, etc. During the day.  Your kidneys were injured by your dehydration.  Please avoid any medications that include ibuprofen, naprosyn, and aspirin.  Please stop taking your lisinopril-HCTZ and hydralazine because of your mildly low blood pressures.  Also some of these medications are not good for your kidneys when you have recently been dehydrated.  Stop your potassium also because you will likely not need it if you are not taking your HCTZ.     Increase activity slowly    Complete by:  As directed             Medication List    STOP taking these medications        hydrALAZINE 100 MG tablet  Commonly known as:  APRESOLINE     lisinopril-hydrochlorothiazide 20-12.5 MG per tablet  Commonly known as:  PRINZIDE,ZESTORETIC     meclizine 25 MG tablet  Commonly known as:  ANTIVERT     potassium chloride SA 20 MEQ tablet  Commonly known as:  K-DUR,KLOR-CON      TAKE these medications        ALPRAZolam 0.25 MG tablet  Commonly known as:  XANAX  Take 1 tablet (0.25 mg total) by mouth 3 (three) times daily as needed for anxiety.     atorvastatin 20 MG tablet  Commonly known as:  LIPITOR  Take 1 tablet (20 mg total) by mouth daily at 6 PM.     Diapers & Supplies Misc  Use as directed     famotidine 20 MG tablet  Commonly known as:  PEPCID  Take 1 tablet (20 mg total) by mouth 2 (two) times daily.     feeding supplement (ENSURE COMPLETE) Liqd  Take 237 mLs by mouth 3 (three) times daily between meals.     gabapentin 100 MG capsule  Commonly known as:  NEURONTIN  Take 2 capsules (200 mg total) by mouth 3 (three) times daily.     labetalol 300 MG tablet  Commonly known as:  NORMODYNE  Take 1 tablet (300 mg total) by mouth 2 (two) times daily.     methocarbamol 500 MG tablet  Commonly known as:  ROBAXIN  Take 1 tablet (500 mg total) by mouth every 6 (six) hours as needed for muscle spasms.     nicotine 14 mg/24hr  patch  Commonly known as:  NICODERM CQ - dosed in mg/24 hours  Place 1 patch (14 mg total) onto the skin daily.     Oxycodone HCl 10 MG Tabs  Take 1 tablet (10 mg total) by mouth daily.     polyethylene glycol packet  Commonly known as:  MIRALAX / GLYCOLAX  Take 17 g by mouth daily after supper.     sertraline 100 MG tablet  Commonly known as:  ZOLOFT  Take 1 tablet (100 mg total) by mouth daily.     traMADol 50 MG tablet  Commonly known as:  ULTRAM  Take 1 tablet (50 mg total) by mouth every 6 (six) hours as needed for moderate pain.       Follow-up Information    Follow up with [facility].   Why:  Registered Nurse   Contact information:   [address] [place] [address] [phone]       Follow up with [facility].   Why:  Rolling walker and [employer].    Contact information:   [address] [place] [address] [phone]       Follow up with Doris CheadleADVANI, DEEPAK, MD On 01/20/2015.   Specialty:  Internal Medicine   Why:  Appointment scheduled for f/u on Monday 2/29 at 2:15p   Contact information:   [address] [place] [address] [phone]        The results of significant diagnostics from this hospitalization (including imaging, microbiology, ancillary and laboratory) are listed below for reference.    Significant Diagnostic Studies: Nm Myocar Multi W/spect W/wall Motion / Ef  01/17/2015   CLINICAL DATA:  Chest pain.  History of stroke.  EXAM: MYOCARDIAL IMAGING WITH SPECT (REST AND PHARMACOLOGIC-STRESS)  GATED LEFT VENTRICULAR WALL MOTION STUDY  LEFT VENTRICULAR EJECTION FRACTION  TECHNIQUE: Standard myocardial SPECT imaging was performed after resting intravenous injection of 10 mCi Tc-4555m sestamibi. Subsequently, intravenous infusion of  Lexiscan was performed under the supervision of the Cardiology staff. At peak effect of the drug, 30 mCi Tc-52m sestamibi was injected intravenously and  standard myocardial SPECT imaging was performed. Quantitative gated imaging was also performed to evaluate left ventricular wall motion, and estimate left ventricular ejection fraction.  COMPARISON:  None.  FINDINGS: Perfusion: No decreased activity in the left ventricle on stress imaging to suggest reversible ischemia or infarction. Diaphragmatic attenuation of the inferior wall.  Wall Motion: Normal left ventricular wall motion. No left ventricular dilation.  Left Ventricular Ejection Fraction: 75 %  End diastolic volume 79 ml  End systolic volume 20 ml  IMPRESSION: 1. No reversible ischemia or infarction. Diaphragmatic attenuation of the inferior wall  2. Normal left ventricular wall motion.  3. Left ventricular ejection fraction 75%  4. Low-risk stress test findings*.  *2012 Appropriate Use Criteria for Coronary Revascularization Focused Update: J Am Coll Cardiol. 2012;59(9):857-881. http://content.dementiazones.com.aspx?articleid=1201161   Electronically Signed   By: Rudie Meyer M.D.   On: 01/17/2015 14:44   Dg Chest Port 1 View  01/16/2015   CLINICAL DATA:  Acute chest pain.  EXAM: PORTABLE CHEST - 1 VIEW  COMPARISON:  October 10, 2014.  FINDINGS: Stable cardiomediastinal silhouette. Stable presence of stent graft in the descending thoracic aorta. No pneumothorax or pleural effusion is noted. No acute pulmonary disease is noted. Bony thorax is intact.  IMPRESSION: No acute cardiopulmonary abnormality seen.   Electronically Signed   By: Lupita Raider, M.D.   On: 01/16/2015 14:36   Ct Angio Chest Aorta W/cm &/or Wo/cm  01/16/2015   CLINICAL DATA:  History of dissection (8/25) post aortic stent placement, now with chest pain and shortness of breath for 1 day. History of smoking, hypertension, polycystic kidney disease.  EXAM: CT ANGIOGRAPHY CHEST, ABDOMEN AND PELVIS  TECHNIQUE: Multidetector CT imaging through the chest, abdomen and pelvis was performed using the standard protocol during bolus  administration of intravenous contrast. Multiplanar reconstructed images and MIPs were obtained and reviewed to evaluate the vascular anatomy.  CONTRAST:  OMNIPAQUE IOHEXOL 350 MG/ML SOLN  COMPARISON:  CT of the chest, abdomen and pelvis - 11/17/2014  FINDINGS: Vascular Findings of the chest:  Stable sequela of endovascular repair of descending thoracic aortic dissection. Review of the precontrast images are negative for the presence of an intramural hematoma. The anterior aspect of the proximal end of the stent graft again is noted to project into the lumen of the aortic arch (sagittal image 87, series 503), unchanged. Otherwise, the proximal end of the stent graft is well apposed against the walls of the distal aspect of the aortic arch wall the distal end of the stent graft is well apposed against the walls of the distal descending thoracic aorta. The stent graft remains widely patent.  The excluded descending thoracic aorta is well apposed against the walls of the stent graft. No evidence of endoleak. No definite thoracic aortic dissection or periaortic stranding.  Normal heart size. Trace amount of pericardial fluid, presumably physiologic. Scattered minimal coronary artery calcifications. There is a minimal amount of ill-defined soft tissue within the anterior mediastinum, similar to the prior examination and favored to represent residual thymic tissue.  Although this examination was not tailored for the evaluation of the pulmonary arteries, there are no discrete filling defects within the central pulmonary arterial tree to suggest central pulmonary embolism. Normal caliber the main pulmonary artery.  -------------------------------------------------------------  Thoracic aortic measurements:  Sinotubular junction  31 mm as measured in  greatest oblique coronal dimension.  Proximal ascending aorta  35 mm as measured in greatest oblique axial dimension at the level of the main pulmonary artery. And  approximately 36 mm in greatest oblique coronal dimension (image 49, series 502).  Aortic arch aorta  26 mm as measured in greatest oblique coronal dimension (image 57, series 502).  Proximal descending thoracic aorta  37 mm as measured in greatest oblique axial dimension at the level of the main pulmonary artery.  Distal descending thoracic aorta  27 mm as measured in greatest oblique axial dimension at the level of the diaphragmatic hiatus.  Review of the MIP images confirms the above findings.  -------------------------------------------------------------  Non-Vascular Findings of the chest:  Minimal bibasilar dependent ground-glass atelectasis. No discrete focal airspace opacities. No pleural effusion or pneumothorax. The central pulmonary airways appear widely patent.  No discrete pulmonary nodules. No mediastinal, hilar or axillary lymphadenopathy.  No acute or aggressive osseus abnormalities within the chest. Regional soft tissues appear normal.  ---------------------------------------------------------------  Vascular Findings of the abdomen and pelvis:  Abdominal aorta: There is a moderate amount of eccentric mixed calcified and noncalcified atherosclerotic plaque throughout the normal caliber abdominal aorta, not resulting in a hemodynamically significant stenosis. No abdominal aortic dissection or periaortic stranding.  Celiac artery: There is grossly unchanged eccentric slightly irregular noncalcified atherosclerotic plaque involving the origin the celiac artery, likely resulting in approximately 50% luminal narrowing. The left gastric artery is initially noted to supine an accessory left hepatic artery. Otherwise, conventional branching pattern.  SMA: Widely patent. Conventional branching pattern. The distal tributaries of the SMA appear widely patent throughout their imaged course.  Right Renal artery: Solitary ; there is a minimal amount of eccentric mixed calcified and noncalcified atherosclerotic  plaque involving the caudal proximal aspect of the right renal artery, not resulting in hemodynamically significant stenosis.  Left Renal artery: Solitary with early bifurcation. Widely patent without hemodynamically significant stenosis.  IMA: Widely patent without hemodynamically significant stenosis.  Pelvic vasculature: There is a minimal amount of eccentric mixed calcified and noncalcified atherosclerotic plaque throughout the bilateral common iliac arteries, not resulting in hemodynamically significant stenosis. The bilateral internal iliac arteries are mildly disease, left greater than right, though patent and of normal caliber. The bilateral external iliac arteries are widely patent without hemodynamically significant narrowing. There is a minimal amount of eccentric mixed calcified and noncalcified atherosclerotic plaque within the bilateral common femoral arteries, not resulting in hemodynamically significant stenosis.  Review of the MIP images confirms the above findings.   --------------------------------------------------------------------------------  Nonvascular Findings of the abdomen and pelvis:  Re- demonstrated sequela of polycystic kidney disease with multiple hepatic cysts also re- demonstrated. Normal hepatic contour. Normal appearance of the gallbladder given underdistention. No radiopaque gallstones. No intra or extrahepatic biliary duct dilatation. No ascites.  There is symmetric enhancement of the bilateral kidneys. No definite urinary obstruction. No perinephric stranding. Normal appearance of the bilateral adrenal glands, pancreas and spleen.  The bowel is normal in course and caliber without wall thickening or evidence of obstruction. Normal appearance of the appendix. No pneumoperitoneum, pneumatosis or portal venous gas.  No retroperitoneal, mesenteric, pelvic or inguinal lymphadenopathy.  There is heterogeneous enhancement of the uterus with a suspected approximately 4.3 x 4.3 cm  heterogeneously enhancing fibroid within the anterior aspect of the uterus (image 244, series 501). Otherwise, normal appearance of the pelvic organs. Normal appearance of the urinary bladder given degree distention. No free fluid in the pole the cul-de-sac.  No acute or  aggressive osseous abnormalities.  Regional soft tissues appear normal.  Review of the MIP images confirms the above findings.  IMPRESSION: Vascular Impression of the chest:  1. Stable sequela of endovascular repair of the descending thoracic aorta without evidence of complication. Specifically, no evidence of endoleak or evidence of recurrent dissection. 2. Normal caliber and appearance of the ascending thoracic aorta. 3. Coronary artery calcifications. Nonvascular Impression of the chest:  1. No acute cardiopulmonary disease. ---------------------------------------------------------------  Vascular Impression of the abdomen and pelvis:  1. Moderate amount of eccentric mixed calcified and noncalcified atherosclerotic plaque scattered within a normal caliber abdominal aorta, not resulting in a hemodynamically significant stenosis. No abdominal aortic dissection or periaortic stranding. 2. Eccentric noncalcified atherosclerotic plaque involving the origin the celiac artery likely results in approximately 50% luminal narrowing, grossly unchanged. Nonvascular Impression of the abdomen and pelvis:  1. No acute findings within the abdomen or pelvis. 2. Similar findings of polycystic kidney disease and multiple hepatic cysts. 3. Myomatous uterus including an approximately 4.3 cm heterogeneously enhancing fibroid within the anterior wall of the uterus.   Electronically Signed   By: Simonne Come M.D.   On: 01/16/2015 17:03   Ct Angio Abd/pel W/ And/or W/o  01/16/2015   CLINICAL DATA:  History of dissection (8/25) post aortic stent placement, now with chest pain and shortness of breath for 1 day. History of smoking, hypertension, polycystic kidney disease.   EXAM: CT ANGIOGRAPHY CHEST, ABDOMEN AND PELVIS  TECHNIQUE: Multidetector CT imaging through the chest, abdomen and pelvis was performed using the standard protocol during bolus administration of intravenous contrast. Multiplanar reconstructed images and MIPs were obtained and reviewed to evaluate the vascular anatomy.  CONTRAST:  OMNIPAQUE IOHEXOL 350 MG/ML SOLN  COMPARISON:  CT of the chest, abdomen and pelvis - 11/17/2014  FINDINGS: Vascular Findings of the chest:  Stable sequela of endovascular repair of descending thoracic aortic dissection. Review of the precontrast images are negative for the presence of an intramural hematoma. The anterior aspect of the proximal end of the stent graft again is noted to project into the lumen of the aortic arch (sagittal image 87, series 503), unchanged. Otherwise, the proximal end of the stent graft is well apposed against the walls of the distal aspect of the aortic arch wall the distal end of the stent graft is well apposed against the walls of the distal descending thoracic aorta. The stent graft remains widely patent.  The excluded descending thoracic aorta is well apposed against the walls of the stent graft. No evidence of endoleak. No definite thoracic aortic dissection or periaortic stranding.  Normal heart size. Trace amount of pericardial fluid, presumably physiologic. Scattered minimal coronary artery calcifications. There is a minimal amount of ill-defined soft tissue within the anterior mediastinum, similar to the prior examination and favored to represent residual thymic tissue.  Although this examination was not tailored for the evaluation of the pulmonary arteries, there are no discrete filling defects within the central pulmonary arterial tree to suggest central pulmonary embolism. Normal caliber the main pulmonary artery.  -------------------------------------------------------------  Thoracic aortic measurements:  Sinotubular junction  31 mm as  measured in greatest oblique coronal dimension.  Proximal ascending aorta  35 mm as measured in greatest oblique axial dimension at the level of the main pulmonary artery. And approximately 36 mm in greatest oblique coronal dimension (image 49, series 502).  Aortic arch aorta  26 mm as measured in greatest oblique coronal dimension (image 57, series 502).  Proximal descending  thoracic aorta  37 mm as measured in greatest oblique axial dimension at the level of the main pulmonary artery.  Distal descending thoracic aorta  27 mm as measured in greatest oblique axial dimension at the level of the diaphragmatic hiatus.  Review of the MIP images confirms the above findings.  -------------------------------------------------------------  Non-Vascular Findings of the chest:  Minimal bibasilar dependent ground-glass atelectasis. No discrete focal airspace opacities. No pleural effusion or pneumothorax. The central pulmonary airways appear widely patent.  No discrete pulmonary nodules. No mediastinal, hilar or axillary lymphadenopathy.  No acute or aggressive osseus abnormalities within the chest. Regional soft tissues appear normal.  ---------------------------------------------------------------  Vascular Findings of the abdomen and pelvis:  Abdominal aorta: There is a moderate amount of eccentric mixed calcified and noncalcified atherosclerotic plaque throughout the normal caliber abdominal aorta, not resulting in a hemodynamically significant stenosis. No abdominal aortic dissection or periaortic stranding.  Celiac artery: There is grossly unchanged eccentric slightly irregular noncalcified atherosclerotic plaque involving the origin the celiac artery, likely resulting in approximately 50% luminal narrowing. The left gastric artery is initially noted to supine an accessory left hepatic artery. Otherwise, conventional branching pattern.  SMA: Widely patent. Conventional branching pattern. The distal tributaries of the SMA  appear widely patent throughout their imaged course.  Right Renal artery: Solitary ; there is a minimal amount of eccentric mixed calcified and noncalcified atherosclerotic plaque involving the caudal proximal aspect of the right renal artery, not resulting in hemodynamically significant stenosis.  Left Renal artery: Solitary with early bifurcation. Widely patent without hemodynamically significant stenosis.  IMA: Widely patent without hemodynamically significant stenosis.  Pelvic vasculature: There is a minimal amount of eccentric mixed calcified and noncalcified atherosclerotic plaque throughout the bilateral common iliac arteries, not resulting in hemodynamically significant stenosis. The bilateral internal iliac arteries are mildly disease, left greater than right, though patent and of normal caliber. The bilateral external iliac arteries are widely patent without hemodynamically significant narrowing. There is a minimal amount of eccentric mixed calcified and noncalcified atherosclerotic plaque within the bilateral common femoral arteries, not resulting in hemodynamically significant stenosis.  Review of the MIP images confirms the above findings.   --------------------------------------------------------------------------------  Nonvascular Findings of the abdomen and pelvis:  Re- demonstrated sequela of polycystic kidney disease with multiple hepatic cysts also re- demonstrated. Normal hepatic contour. Normal appearance of the gallbladder given underdistention. No radiopaque gallstones. No intra or extrahepatic biliary duct dilatation. No ascites.  There is symmetric enhancement of the bilateral kidneys. No definite urinary obstruction. No perinephric stranding. Normal appearance of the bilateral adrenal glands, pancreas and spleen.  The bowel is normal in course and caliber without wall thickening or evidence of obstruction. Normal appearance of the appendix. No pneumoperitoneum, pneumatosis or portal venous  gas.  No retroperitoneal, mesenteric, pelvic or inguinal lymphadenopathy.  There is heterogeneous enhancement of the uterus with a suspected approximately 4.3 x 4.3 cm heterogeneously enhancing fibroid within the anterior aspect of the uterus (image 244, series 501). Otherwise, normal appearance of the pelvic organs. Normal appearance of the urinary bladder given degree distention. No free fluid in the pole the cul-de-sac.  No acute or aggressive osseous abnormalities.  Regional soft tissues appear normal.  Review of the MIP images confirms the above findings.  IMPRESSION: Vascular Impression of the chest:  1. Stable sequela of endovascular repair of the descending thoracic aorta without evidence of complication. Specifically, no evidence of endoleak or evidence of recurrent dissection. 2. Normal caliber and appearance of the ascending thoracic  aorta. 3. Coronary artery calcifications. Nonvascular Impression of the chest:  1. No acute cardiopulmonary disease. ---------------------------------------------------------------  Vascular Impression of the abdomen and pelvis:  1. Moderate amount of eccentric mixed calcified and noncalcified atherosclerotic plaque scattered within a normal caliber abdominal aorta, not resulting in a hemodynamically significant stenosis. No abdominal aortic dissection or periaortic stranding. 2. Eccentric noncalcified atherosclerotic plaque involving the origin the celiac artery likely results in approximately 50% luminal narrowing, grossly unchanged. Nonvascular Impression of the abdomen and pelvis:  1. No acute findings within the abdomen or pelvis. 2. Similar findings of polycystic kidney disease and multiple hepatic cysts. 3. Myomatous uterus including an approximately 4.3 cm heterogeneously enhancing fibroid within the anterior wall of the uterus.   Electronically Signed   By: Simonne Come M.D.   On: 01/16/2015 17:03    Microbiology: Recent Results (from the past 240 hour(s))  MRSA  PCR Screening     Status: None   Collection Time: 01/16/15  5:03 PM  Result Value Ref Range Status   MRSA by PCR NEGATIVE NEGATIVE Final    Comment:        The GeneXpert MRSA Assay (FDA approved for NASAL specimens only), is one component of a comprehensive MRSA colonization surveillance program. It is not intended to diagnose MRSA infection nor to guide or monitor treatment for MRSA infections.      Labs: Basic Metabolic Panel:  Recent Labs Lab 01/16/15 1315 01/16/15 2143 01/18/15 0410 01/19/15 0710 01/20/15 0353  NA 139  --  136 139 138  K 3.6  --  4.4 4.0 3.7  CL 108  --  108 109 111  CO2 24  --  21 23 22   GLUCOSE 119*  --  94 96 86  BUN 15  --  25* 22 16  CREATININE 1.41*  --  1.91* 1.75* 1.44*  CALCIUM 9.4  --  8.7 8.9 8.3*  MG  --  2.0  --   --   --    Liver Function Tests: No results for input(s): AST, ALT, ALKPHOS, BILITOT, PROT, ALBUMIN in the last 168 hours. No results for input(s): LIPASE, AMYLASE in the last 168 hours. No results for input(s): AMMONIA in the last 168 hours. CBC:  Recent Labs Lab 01/16/15 1315 01/17/15 0400 01/18/15 0410 01/19/15 0710  WBC 7.5 6.9 4.9 6.0  HGB 13.4 12.0 10.4* 10.3*  HCT 40.1 38.2 32.5* 31.4*  MCV 85.0 86.0 86.7 85.6  PLT 208 240 229 198   Cardiac Enzymes:  Recent Labs Lab 01/16/15 1716 01/16/15 2250 01/17/15 0400 01/17/15 1412  TROPONINI <0.03 <0.03 0.05* <0.03   BNP: BNP (last 3 results)  Recent Labs  01/16/15 1315  BNP 69.4    ProBNP (last 3 results)  Recent Labs  08/01/14 1035 08/22/14 1350  PROBNP 300.2* 386.7*    CBG: No results for input(s): GLUCAP in the last 168 hours.  Time coordinating discharge: 35 minutes  Signed:  Slaton Reaser  [facility] 01/20/2015, 9:46 AM

## 2015-01-19 NOTE — Progress Notes (Signed)
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 Following discharge from the hospital she has had 2 ED visits in November and December 2015 for chest pain, shortness of breath, hyperventilation and anxiety and ruled out for aortic dissection with CT angiogram. Patient also has history of stroke with lower extremity weakness, chronic kidney disease stage II, ongoing tobacco use, hypertension, arthritis, bipolar disorder. At baseline patient is wheelchair bound since having paraparesis last year.  She presented with she experienced substernal chest pressure and burning, 10/10 radiating to her left back. She denied any headache, blurred vision, back pain, shortness of breath, diaphoresis, palpitations and rash . Reports current symptoms to be different from previous visits for chest pain symptoms. Course has been complicated by acute kidney injury related to dehydration. She has also had some progressive mild anemia.  Assessment/Plan  Chest pain initially thought to be due to unstable angina, heart score 5.  Ddx includes early shingles, GERD, possible COPD.   -  Troponin trended down -  Heparin gtt off -  Continue daily aspirin -  Continue BB and statin -  NM stress test low risk -  ECHO with normal EF, grade 1 DD -  Trial of duonebs and BID PPI with GI cocktail  -  Tele:  NSR - okay to d/c telemetry  Mild hypotension in patient with essential hypertension, ddx includes dehydration,  hemorrhage (given decreased hemoglobin) -  Continue IVF -  Continue to hold HCTZ, ACEI -  Hold parameters placed on labetalol -  Resume half dose hydralazine  Acute on Chronic renal insufficiency, stage III (moderate), improving somewhat.  Baseline creatinine 1.2 -  UA and FENa: Consistent with dehydration -  Continue IVF -  Repeat BMP in AM  Normocytic anemia, no evidence of bleeding.  -  Occult stool pending -  Iron studies, b12, TSH wnl -  Folate pending -  Hemoglobin approximately stable  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 in 08/2014 No evidence of dissection or leak with CT angiogram and chest and abdomen.  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  Tobacco abuse Continue nicotine patch.  Diet:  full Access:  PIV IVF:  off Proph:  scds  Code Status: full Family Communication: patient, daughter Disposition Plan:  home once blood counts stable and AKI improving   Consultants:  Cardiology  Procedures:  ECHO  NM Stress test  CT angio chest/a/p  Antibiotics:  none   HPI/Subjective:  Feels somewhat better, less lightheaded. Denies SOB.  Having pains in legs which are chronic and requesting rx for oxycontin.    Objective: Filed Vitals:   01/19/15 0400 01/19/15 0756 01/19/15 0915 01/19/15 1012  BP: 125/70 146/77  141/74  Pulse: 70 70  75  Temp: 98.6 F (37 C) 98.7 F (37.1 C)    TempSrc: Oral Oral    Resp: 18 20    Height:      Weight:   62.2 kg (137 lb 2 oz)   SpO2: 94% 98%      Intake/Output Summary (  Last 24 hours) at 01/19/15 1121 Last data filed at 01/19/15 1008  Gross per 24 hour  Intake    120 ml  Output      0 ml  Net    120 ml   Filed Weights   01/17/15 0432 01/18/15 0430 01/19/15 0915  Weight: 60.782 kg (134 lb) 59.104 kg (130 lb 4.8 oz) 62.2 kg (137 lb 2 oz)    Exam:   General:  BF, No acute distress  HEENT:  NCAT, MMM  Cardiovascular:  RRR, nl S1,  S2 no mrg, 2+ pulses, warm extremities  Respiratory:  CTAB, no increased WOB  Abdomen:   NABS, soft, NT/ND  MSK:  Increased tone and cecreased bulk bilateral lower extremities, no LEE  Neuro:  Paralysis of bilateral lower extremities, able to sit up.  Left leg is 2/5, right leg 3/5 strength  Data Reviewed: Basic Metabolic Panel:  Recent Labs Lab 01/16/15 1315 01/16/15 2143 01/18/15 0410 01/19/15 0710  NA 139  --  136 139  K 3.6  --  4.4 4.0  CL 108  --  108 109  CO2 24  --  21 23  GLUCOSE 119*  --  94 96  BUN 15  --  25* 22  CREATININE 1.41*  --  1.91* 1.75*  CALCIUM 9.4  --  8.7 8.9  MG  --  2.0  --   --    Liver Function Tests: No results for input(s): AST, ALT, ALKPHOS, BILITOT, PROT, ALBUMIN in the last 168 hours. No results for input(s): LIPASE, AMYLASE in the last 168 hours. No results for input(s): AMMONIA in the last 168 hours. CBC:  Recent Labs Lab 01/16/15 1315 01/17/15 0400 01/18/15 0410 01/19/15 0710  WBC 7.5 6.9 4.9 6.0  HGB 13.4 12.0 10.4* 10.3*  HCT 40.1 38.2 32.5* 31.4*  MCV 85.0 86.0 86.7 85.6  PLT 208 240 229 198   Cardiac Enzymes:  Recent Labs Lab 01/16/15 1716 01/16/15 2250 01/17/15 0400 01/17/15 1412  TROPONINI <0.03 <0.03 0.05* <0.03   BNP (last 3 results)  Recent Labs  01/16/15 1315  BNP 69.4    ProBNP (last 3 results)  Recent Labs  08/01/14 1035 08/22/14 1350  PROBNP 300.2* 386.7*    CBG: No results for input(s): GLUCAP in the last 168 hours.  Recent Results (from the past 240 hour(s))  MRSA PCR Screening     Status: None   Collection Time: 01/16/15  5:03 PM  Result Value Ref Range Status   MRSA by PCR NEGATIVE NEGATIVE Final    Comment:        The GeneXpert MRSA Assay (FDA approved for NASAL specimens only), is one component of a comprehensive MRSA colonization surveillance program. It is not intended to diagnose MRSA infection nor to guide or monitor treatment for MRSA infections.       Studies: Nm Myocar Multi W/spect W/wall Motion / Ef  01/17/2015   CLINICAL DATA:  Chest pain.  History of stroke.  EXAM: MYOCARDIAL IMAGING WITH SPECT (REST AND PHARMACOLOGIC-STRESS)  GATED LEFT VENTRICULAR WALL MOTION STUDY  LEFT VENTRICULAR EJECTION FRACTION  TECHNIQUE: Standard myocardial SPECT imaging was performed after resting intravenous injection of 10 mCi Tc-54m sestamibi. Subsequently, intravenous infusion of Lexiscan was performed under the supervision of the Cardiology staff. At peak effect of the drug, 30 mCi Tc-91m sestamibi was injected intravenously and standard myocardial SPECT imaging was performed. Quantitative gated imaging was also performed to evaluate left ventricular wall motion, and estimate left  ventricular ejection fraction.  COMPARISON:  None.  FINDINGS: Perfusion: No decreased activity in the left ventricle on stress imaging to suggest reversible ischemia or infarction. Diaphragmatic attenuation of the inferior wall.  Wall Motion: Normal left ventricular wall motion. No left ventricular dilation.  Left Ventricular Ejection Fraction: 75 %  End diastolic volume 79 ml  End systolic volume 20 ml  IMPRESSION: 1. No reversible ischemia or infarction. Diaphragmatic attenuation of the inferior wall  2. Normal left ventricular wall motion.  3. Left ventricular ejection fraction 75%  4. Low-risk stress test findings*.  *2012 Appropriate Use Criteria for Coronary Revascularization Focused Update: J Am Coll Cardiol. 2012;59(9):857-881. http://content.dementiazones.com.aspx?articleid=1201161   Electronically Signed   By: Rudie Meyer M.D.   On: 01/17/2015 14:44    Scheduled Meds: . atorvastatin  20 mg Oral q1800  . famotidine  20 mg Oral BID  . feeding supplement (ENSURE COMPLETE)  237 mL Oral TID BM  . gabapentin  200 mg Oral BID  . hydrALAZINE  50 mg Oral BID  . ipratropium-albuterol  3 mL Nebulization TID  . labetalol  300 mg Oral BID  . nicotine  14 mg Transdermal Daily  .  oxyCODONE  10 mg Oral Daily  . pantoprazole  40 mg Oral BID AC  . polyethylene glycol  17 g Oral QPC supper  . sertraline  100 mg Oral Daily  . sodium chloride  3 mL Intravenous Q12H   Continuous Infusions: . 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 If 7PM-7AM, please contact night-coverage at [email or web address], password [facility] 01/19/2015, 11:21 AM  LOS: 3 days

## 2015-01-20 ENCOUNTER — Inpatient Hospital Stay: Payer: Medicaid Other

## 2015-01-20 DIAGNOSIS — E860 Dehydration: Secondary | ICD-10-CM

## 2015-01-20 LAB — BASIC METABOLIC PANEL
Anion gap: 5 (ref 5–15)
BUN: 16 mg/dL (ref 6–23)
CALCIUM: 8.3 mg/dL — AB (ref 8.4–10.5)
CO2: 22 mmol/L (ref 19–32)
Chloride: 111 mmol/L (ref 96–112)
Creatinine, Ser: 1.44 mg/dL — ABNORMAL HIGH (ref 0.50–1.10)
GFR calc Af Amer: 48 mL/min — ABNORMAL LOW (ref >90)
GFR, EST NON AFRICAN AMERICAN: 42 mL/min — AB (ref >90)
GLUCOSE: 86 mg/dL (ref 70–99)
Potassium: 3.7 mmol/L (ref 3.5–5.1)
Sodium: 138 mmol/L (ref 135–145)

## 2015-01-20 LAB — FOLATE RBC
FOLATE, HEMOLYSATE: 336.7 ng/mL
Folate, RBC: 1017 ng/mL (ref >498)
HEMATOCRIT: 33.1 % — AB (ref 34.0–46.6)

## 2015-01-20 MED ORDER — OXYCODONE HCL 10 MG PO TABS: 10.0000 mg | ORAL_TABLET | Freq: Every day | ORAL | Status: DC

## 2015-01-20 MED ORDER — NICOTINE 14 MG/24HR TD PT24: 14.0000 mg | MEDICATED_PATCH | Freq: Every day | TRANSDERMAL | Status: DC

## 2015-01-20 NOTE — Progress Notes (Signed)
CSW (Clinical Child psychotherapistocial Worker) [facility]emerg[facility] home. RNCM provided CSW with pt daughter [name] address. Pt nurse notified CSW that pt is ready for transport. CSW arranged with [facility]. Pt daughter at bedside. Pt has no further hospital social work needs.  Dariusz Brase, LCSWA [phone]

## 2015-01-27 ENCOUNTER — Other Ambulatory Visit: Payer: Medicaid Other

## 2015-01-27 ENCOUNTER — Ambulatory Visit: Payer: Medicaid Other | Admitting: Cardiology

## 2015-02-04 ENCOUNTER — Encounter: Payer: Self-pay | Admitting: Cardiology

## 2015-02-05 ENCOUNTER — Encounter (HOSPITAL_COMMUNITY): Payer: Self-pay

## 2015-02-05 ENCOUNTER — Emergency Department (HOSPITAL_COMMUNITY): Payer: Medicaid Other

## 2015-02-05 ENCOUNTER — Emergency Department (HOSPITAL_COMMUNITY)
Admission: EM | Admit: 2015-02-05 | Discharge: 2015-02-06 | Disposition: A | Discharge status: Home / Self Care | Payer: Medicaid Other | Attending: Emergency Medicine | Admitting: Emergency Medicine

## 2015-02-05 DIAGNOSIS — I10 Essential (primary) hypertension: Secondary | ICD-10-CM | POA: Insufficient documentation

## 2015-02-05 DIAGNOSIS — Z720 Tobacco use: Secondary | ICD-10-CM | POA: Insufficient documentation

## 2015-02-05 DIAGNOSIS — R079 Chest pain, unspecified: Secondary | ICD-10-CM | POA: Diagnosis not present

## 2015-02-05 DIAGNOSIS — Z8739 Personal history of other diseases of the musculoskeletal system and connective tissue: Secondary | ICD-10-CM | POA: Insufficient documentation

## 2015-02-05 DIAGNOSIS — G8929 Other chronic pain: Secondary | ICD-10-CM | POA: Diagnosis not present

## 2015-02-05 DIAGNOSIS — F329 Major depressive disorder, single episode, unspecified: Secondary | ICD-10-CM | POA: Insufficient documentation

## 2015-02-05 DIAGNOSIS — F419 Anxiety disorder, unspecified: Secondary | ICD-10-CM | POA: Diagnosis not present

## 2015-02-05 DIAGNOSIS — Z79899 Other long term (current) drug therapy: Secondary | ICD-10-CM | POA: Diagnosis not present

## 2015-02-05 DIAGNOSIS — Z8673 Personal history of transient ischemic attack (TIA), and cerebral infarction without residual deficits: Secondary | ICD-10-CM | POA: Diagnosis not present

## 2015-02-05 DIAGNOSIS — Z862 Personal history of diseases of the blood and blood-forming organs and certain disorders involving the immune mechanism: Secondary | ICD-10-CM | POA: Diagnosis not present

## 2015-02-05 DIAGNOSIS — Z87448 Personal history of other diseases of urinary system: Secondary | ICD-10-CM | POA: Diagnosis not present

## 2015-02-05 DIAGNOSIS — K219 Gastro-esophageal reflux disease without esophagitis: Secondary | ICD-10-CM | POA: Insufficient documentation

## 2015-02-05 DIAGNOSIS — R0602 Shortness of breath: Secondary | ICD-10-CM | POA: Diagnosis present

## 2015-02-05 LAB — CBC
HCT: 41.1 % (ref 36.0–46.0)
HEMOGLOBIN: 13.3 g/dL (ref 12.0–15.0)
MCH: 27.9 pg (ref 26.0–34.0)
MCHC: 32.4 g/dL (ref 30.0–36.0)
MCV: 86.2 fL (ref 78.0–100.0)
Platelets: 190 10*3/uL (ref 150–400)
RBC: 4.77 MIL/uL (ref 3.87–5.11)
RDW: 14.7 % (ref 11.5–15.5)
WBC: 7.3 10*3/uL (ref 4.0–10.5)

## 2015-02-05 LAB — I-STAT TROPONIN, ED: TROPONIN I, POC: 0.01 ng/mL (ref 0.00–0.08)

## 2015-02-05 MED ORDER — LORAZEPAM 2 MG/ML IJ SOLN
1.0000 mg | Freq: Once | INTRAMUSCULAR | Status: AC
  Administered 2015-02-05: 1 mg via INTRAVENOUS
  Filled 2015-02-05: qty 1

## 2015-02-05 MED ORDER — HYDROMORPHONE HCL 1 MG/ML IJ SOLN
0.5000 mg | Freq: Once | INTRAMUSCULAR | Status: AC
  Administered 2015-02-05: 0.5 mg via INTRAVENOUS
  Filled 2015-02-05: qty 1

## 2015-02-05 NOTE — ED Notes (Signed)
Per EMS: pt from home with c/o shortness of breath.  Pt sts shortness of breath started after an argument.  Pt reports chest pain and shortness of breath starting simultaneously.  Nad.

## 2015-02-05 NOTE — ED Notes (Signed)
Pt. Reports chest pain and sob starting today after argument with daughter. Seen here previously for similar symptoms. Per daughter, patient feels better after breathing treatment. Hx of aortic dissection and stent placement.

## 2015-02-05 NOTE — ED Notes (Signed)
Patient transported to X-ray 

## 2015-02-06 ENCOUNTER — Encounter (HOSPITAL_COMMUNITY): Payer: Self-pay

## 2015-02-06 ENCOUNTER — Emergency Department (HOSPITAL_COMMUNITY): Payer: Medicaid Other

## 2015-02-06 LAB — COMPREHENSIVE METABOLIC PANEL
ALK PHOS: 81 U/L (ref 39–117)
ALT: 16 U/L (ref 0–35)
AST: 23 U/L (ref 0–37)
Albumin: 3.8 g/dL (ref 3.5–5.2)
Anion gap: 13 (ref 5–15)
BILIRUBIN TOTAL: 0.5 mg/dL (ref 0.3–1.2)
BUN: 20 mg/dL (ref 6–23)
CALCIUM: 9.7 mg/dL (ref 8.4–10.5)
CO2: 22 mmol/L (ref 19–32)
Chloride: 108 mmol/L (ref 96–112)
Creatinine, Ser: 1.27 mg/dL — ABNORMAL HIGH (ref 0.50–1.10)
GFR calc Af Amer: 56 mL/min — ABNORMAL LOW (ref >90)
GFR calc non Af Amer: 48 mL/min — ABNORMAL LOW (ref >90)
Glucose, Bld: 105 mg/dL — ABNORMAL HIGH (ref 70–99)
POTASSIUM: 3 mmol/L — AB (ref 3.5–5.1)
SODIUM: 143 mmol/L (ref 135–145)
Total Protein: 7.1 g/dL (ref 6.0–8.3)

## 2015-02-06 LAB — TROPONIN I
TROPONIN I: 0.03 ng/mL (ref <0.031)
Troponin I: 0.03 ng/mL (ref <0.031)

## 2015-02-06 MED ORDER — IOHEXOL 350 MG/ML SOLN
100.0000 mL | Freq: Once | INTRAVENOUS | Status: AC | PRN
  Administered 2015-02-06: 100 mL via INTRAVENOUS

## 2015-02-06 NOTE — ED Provider Notes (Signed)
CSN: [id number]     Arrival date & time 02/05/15  2206 History   First MD Initiated Contact with Patient 02/05/15 2213     Chief Complaint  Patient presents with  . Shortness of Breath     (Consider location/radiation/quality/duration/timing/severity/associated sxs/prior Treatment) Patient is a 49 y.o. female presenting with shortness of breath.  Shortness of Breath Severity:  Severe Onset quality:  Sudden Duration:  1 hour Timing:  Constant Progression:  Unchanged Chronicity:  Recurrent Context comment:  Ho type B dissection with paraplegia Relieved by:  Nothing Worsened by:  Nothing tried Ineffective treatments:  None tried Associated symptoms: chest pain   Associated symptoms: no cough and no fever     Past Medical History  Diagnosis Date  . Hypertension   . Family history of anesthesia complication     "alot of back pains from the epidurals"  . Anemia   . Depression   . GERD (gastroesophageal reflux disease)   . 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 Bipolar disorder   . Anxiety   . Complication of anesthesia     "alot of back pains from the epidurals"  . Daily headache   . Migraine     "sometimes twice/day" (01/16/2015)  . Stroke 06/29/2014    residual BLE weakness  . Arthritis     "shoulders; hands" (01/16/2015)  . Chronic lower back pain   . Polycystic kidney disease     /notes 07/05/2014  . Renal insufficiency    Past Surgical History  Procedure Laterality Date  . Cesarean section  1986  . Tubal ligation  1986  . Spinal drain placement  06/29/2014  . Spinal drain removal  07/02/2014  . Thoracentesis  07/05/2014    [name] 07/05/2014  . Thoracic aortic endovascular stent graft N/A 08/23/2014    Procedure: ENDOVASCULAR THORACIC AORTIC  STENT GRAFT;  Surgeon: Sherren Kerns, MD;  Location: [facility] OR;  Service: Vascular;  Laterality:  N/A;   Family History  Problem Relation Age of Onset  . Stroke Father   . Hypertension Father   . Diabetes type II Other   . Hypertension Mother   . Hypertension Sister   . Diabetes Paternal Grandfather    History  Substance Use Topics  . Smoking status: Current Every Day Smoker -- 1.50 packs/day for 37 years    Types: Cigarettes  . Smokeless tobacco: Never Used  . Alcohol Use: 0.0 oz/week    0 Standard drinks or equivalent per week     Comment: "recovering alcoholic since 2013"   OB History    No data available     Review of Systems  Constitutional: Negative for fever.  Respiratory: Positive for shortness of breath. Negative for cough.   Cardiovascular: Positive for chest pain.  All other systems reviewed and are negative.     Allergies  Seroquel and Olanzapine  Home Medications   Prior to Admission medications   Medication Sig Start Date End Date Taking? Authorizing Provider  ALPRAZolam (XANAX) 0.25 MG tablet Take 1 tablet (0.25 mg total) by mouth 3 (three) times daily as needed for anxiety. 10/16/14   Doris Cheadle, MD  atorvastatin (LIPITOR) 20 MG tablet Take 1 tablet (20 mg total) by mouth daily at 6 PM. 10/16/14   Doris Cheadle, MD  Diapers & Supplies MISC Use as directed 10/16/14  Doris Cheadle, MD  famotidine (PEPCID) 20 MG tablet Take 1 tablet (20 mg total) by mouth 2 (two) times daily. 10/16/14   Doris Cheadle, MD  feeding supplement, ENSURE COMPLETE, (ENSURE COMPLETE) LIQD Take 237 mLs by mouth 3 (three) times daily between meals. 07/08/14   Christiane Ha, MD  gabapentin (NEURONTIN) 100 MG capsule Take 2 capsules (200 mg total) by mouth 3 (three) times daily. 08/26/14   Wayne E Gold, PA-C  labetalol (NORMODYNE) 300 MG tablet Take 1 tablet (300 mg total) by mouth 2 (two) times daily. 10/16/14   Doris Cheadle, MD  methocarbamol (ROBAXIN) 500 MG tablet Take 1 tablet (500 mg total) by mouth every 6 (six) hours as needed for muscle spasms. 10/16/14   Doris Cheadle, MD  nicotine (NICODERM CQ - DOSED IN MG/24 HOURS) 14 mg/24hr patch Place 1 patch (14 mg total) onto the skin daily. 01/20/15   Renae Fickle, MD  oxyCODONE 10 MG TABS Take 1 tablet (10 mg total) by mouth daily. 01/20/15   Renae Fickle, MD  polyethylene glycol [facility] / GLYCOLAX) packet Take 17 g by mouth daily after supper. 07/25/14   Jacquelynn Cree, PA-C  sertraline (ZOLOFT) 100 MG tablet Take 1 tablet (100 mg total) by mouth daily. 10/16/14   Doris Cheadle, MD  traMADol (ULTRAM) 50 MG tablet Take 1 tablet (50 mg total) by mouth every 6 (six) hours as needed for moderate pain. 10/16/14   Doris Cheadle, MD   BP 163/83 mmHg  Pulse 59  Temp(Src) 97.6 F (36.4 C) (Oral)  Resp 13  SpO2 98% Physical Exam  Constitutional: [name], place, and time. She appears well-developed and well-nourished.  tearful  HENT:  Head: Normocephalic and atraumatic.  Right Ear: External ear normal.  Left Ear: External ear normal.  Eyes: Conjunctivae and EOM are normal. Pupils are equal, round, and reactive to light.  Neck: Normal range of motion. Neck supple.  Cardiovascular: Normal rate, regular rhythm, normal heart sounds and intact distal pulses.   Pulmonary/Chest: Effort normal and breath sounds normal.  Abdominal: Soft. Bowel sounds are normal. There is no tenderness.  Musculoskeletal: Normal range of motion.  Neurological: She is alert and oriented to person, place, and time.  Skin: Skin is warm and dry.  Vitals reviewed.   ED Course  Procedures (including critical care time) Labs Review Labs Reviewed  COMPREHENSIVE METABOLIC PANEL - Abnormal; Notable for the following:    Potassium 3.0 (*)    Glucose, Bld 105 (*)    Creatinine, Ser 1.27 (*)    GFR calc non Af Amer 48 (*)    GFR calc Af Amer 56 (*)    All other components within normal limits  CBC  TROPONIN I  TROPONIN I  I-STAT TROPOININ, ED    Imaging Review Dg Chest 2 View  02/05/2015   CLINICAL DATA:  Acute  onset of mid chest pain.  Initial encounter.  EXAM: CHEST  2 VIEW  COMPARISON:  Chest radiograph and CTA of the chest performed 01/16/2015  FINDINGS: The lungs are well-aerated and clear. There is no evidence of focal opacification, pleural effusion or pneumothorax.  The heart is normal in size; a stent graft is noted along the descending thoracic aorta. No acute osseous abnormalities are seen.  IMPRESSION: No acute cardiopulmonary process seen.   Electronically Signed   By: Roanna Raider M.D.   On: 02/05/2015 23:39   Ct Angio Chest Aorta W/cm &/or Wo/cm  02/06/2015  CLINICAL DATA:  Dyspnea and chest pain of 1 day duration.  EXAM: CT ANGIOGRAPHY CHEST, ABDOMEN AND PELVIS  TECHNIQUE: Multidetector CT imaging through the chest, abdomen and pelvis was performed using the standard protocol during bolus administration of intravenous contrast. Multiplanar reconstructed images and MIPs were obtained and reviewed to evaluate the vascular anatomy.  CONTRAST:  OMNIPAQUE IOHEXOL 350 MG/ML SOLN  COMPARISON:  None.  01/16/2015  FINDINGS: CTA CHEST FINDINGS  The precontrast examination demonstrates no evidence of intramural hemorrhage. The postcontrast images demonstrate intact appearances of the thoracic aortic endovascular stent graft. There is no evidence of endoleak. There is no dissection. There is no hemorrhage. Pulmonary arteries are well opacified with no evidence of embolism. Minimal calcified coronary artery plaque present.  Mediastinum and hila appear normal. The lungs are clear. There is a trace right pleural effusion, unchanged from 01/16/2015.  Review of the MIP images confirms the above findings.  CTA ABDOMEN AND PELVIS FINDINGS  The abdominal aorta is normal in caliber with minimal non stenotic calcified plaque. The major branches of the aorta are widely patent and normal in caliber. The common iliac arteries and external iliac arteries have moderate calcified plaque without hemodynamically significant  stenosis. There is no aneurysm or dissection of the abdominal aorta.  Polycystic kidneys are again noted, unchanged. Multiple hepatic cysts are also present, unchanged. There are unremarkable arterial phase appearances of the spleen, pancreas and adrenals. Mesentery and bowel appear unremarkable. No acute inflammatory changes are evident in the abdomen or pelvis. No significant musculoskeletal abnormalities are evident.  Review of the MIP images confirms the above findings.  IMPRESSION: 1. Intact thoracic aortic stent graft. No evidence of aortic dissection in the chest or abdomen. 2. No evidence of pulmonary embolism 3. Trace right pleural effusion, unchanged. 4. Polycystic kidneys.  Multiple hepatic cysts. 5. No acute findings are evident in the chest, abdomen or pelvis.   Electronically Signed   By: Ellery Plunk M.D.   On: 02/06/2015 01:51   Ct Angio Abd/pel W/ And/or W/o  02/06/2015   CLINICAL DATA:  Dyspnea and chest pain of 1 day duration.  EXAM: CT ANGIOGRAPHY CHEST, ABDOMEN AND PELVIS  TECHNIQUE: Multidetector CT imaging through the chest, abdomen and pelvis was performed using the standard protocol during bolus administration of intravenous contrast. Multiplanar reconstructed images and MIPs were obtained and reviewed to evaluate the vascular anatomy.  CONTRAST:  OMNIPAQUE IOHEXOL 350 MG/ML SOLN  COMPARISON:  None.  01/16/2015  FINDINGS: CTA CHEST FINDINGS  The precontrast examination demonstrates no evidence of intramural hemorrhage. The postcontrast images demonstrate intact appearances of the thoracic aortic endovascular stent graft. There is no evidence of endoleak. There is no dissection. There is no hemorrhage. Pulmonary arteries are well opacified with no evidence of embolism. Minimal calcified coronary artery plaque present.  Mediastinum and hila appear normal. The lungs are clear. There is a trace right pleural effusion, unchanged from 01/16/2015.  Review of the MIP images confirms  the above findings.  CTA ABDOMEN AND PELVIS FINDINGS  The abdominal aorta is normal in caliber with minimal non stenotic calcified plaque. The major branches of the aorta are widely patent and normal in caliber. The common iliac arteries and external iliac arteries have moderate calcified plaque without hemodynamically significant stenosis. There is no aneurysm or dissection of the abdominal aorta.  Polycystic kidneys are again noted, unchanged. Multiple hepatic cysts are also present, unchanged. There are unremarkable arterial phase appearances of the spleen, pancreas and adrenals. Mesentery  and bowel appear unremarkable. No acute inflammatory changes are evident in the abdomen or pelvis. No significant musculoskeletal abnormalities are evident.  Review of the MIP images confirms the above findings.  IMPRESSION: 1. Intact thoracic aortic stent graft. No evidence of aortic dissection in the chest or abdomen. 2. No evidence of pulmonary embolism 3. Trace right pleural effusion, unchanged. 4. Polycystic kidneys.  Multiple hepatic cysts. 5. No acute findings are evident in the chest, abdomen or pelvis.   Electronically Signed   By: Ellery Plunkaniel R Mitchell M.D.   On: 02/06/2015 01:51     EKG Interpretation   Date/Time:  Wednesday February 05 2015 22:11:59 EDT Ventricular Rate:  74 PR Interval:  127 QRS Duration: 86 QT Interval:  388 QTC Calculation: 430 R Axis:   59 Text Interpretation:  Sinus rhythm Probable LVH with secondary repol abnrm  ST depr, consider ischemia, inferior leads Anterior ST elevation, probably  due to LVH Baseline wander in lead(s) V2 ST depression more pominent in  inferior leads Confirmed by Mirian MoGentry, Cari Vandeberg [phone] on 02/05/2015 10:17:09  PM      MDM   Final diagnoses:  Chest pain    49 y.o. female with pertinent PMH of type B dissection with paraplegia, migraine, anxiety presents with recurrent chest pain.  Patient has been seen numerous times for the same here. She has been  admitted for ACS rule out on numerous occasions. On arrival today the patient has vital signs and physical exam as above. Initial workup unremarkable.  Dissection study ordered.  This was unremarkable from baseline.    Care transferred to Dr. Bebe ShaggyWickline pending delta trop with plan to dc home if negative.  I have reviewed all laboratory and imaging studies if ordered as above  1. Chest pain         Mirian MoMatthew Lela Gell, MD 02/06/15 [phone]

## 2015-02-06 NOTE — ED Provider Notes (Signed)
Pt in no distress Labs/imaging results/ekg reviewed Pt immediately ask for homegoing pain meds on my assessment Agree with initial plan.  If repeat troponin negative will d/c home   Zadie Rhineonald Jaquaya Coyle, MD 02/06/15 [phone]

## 2015-02-06 NOTE — Discharge Instructions (Signed)

## 2015-02-07 ENCOUNTER — Telehealth: Payer: Self-pay | Admitting: Emergency Medicine

## 2015-02-07 NOTE — Telephone Encounter (Signed)
Form Needs to be signed and faxed

## 2015-02-07 NOTE — Telephone Encounter (Signed)
ERROR

## 2015-02-18 ENCOUNTER — Telehealth: Payer: Self-pay | Admitting: Emergency Medicine

## 2015-02-18 NOTE — Telephone Encounter (Signed)
I spoke with Dr Orpah CobbAdvani who stated that the patient must have a refferal to Physical therapy for a "Mobility Evaluation" Before he can see the patient for a power wheelchair. Angelique BlonderDenise he requested you set up a refferal for same. Thank you!

## 2015-02-18 NOTE — Telephone Encounter (Signed)
FORM NEEDS COMPLETION SIGN AND FAX BACK

## 2015-02-23 IMAGING — CT CT CTA ABD/PEL W/CM AND/OR W/O CM
3 of 8 series · 12 of 36 positions shown · IV contrast (Iohexol (Omnipaque 350))
Comparison: 10/10/2014

CLINICAL DATA: Acute chest pain, hypertension, diaphoresis
shortness of breath earlier today.

EXAM:
CT ANGIOGRAPHY CHEST, ABDOMEN AND PELVIS
TECHNIQUE: Multidetector CT imaging through the chest, abdomen and pelvis was
performed using the standard protocol during bolus administration of
intravenous contrast. Multiplanar reconstructed images and MIPs were
obtained and reviewed to evaluate the vascular anatomy.
CONTRAST:  100mL OMNIPAQUE IOHEXOL 350 MG/ML SOLN

[Series 501: aorta, idose (1) · axial · 0.77mm/px · z∈[-798,-482]mm · 3 of 318 slices shown]
[im 80/318  lung]
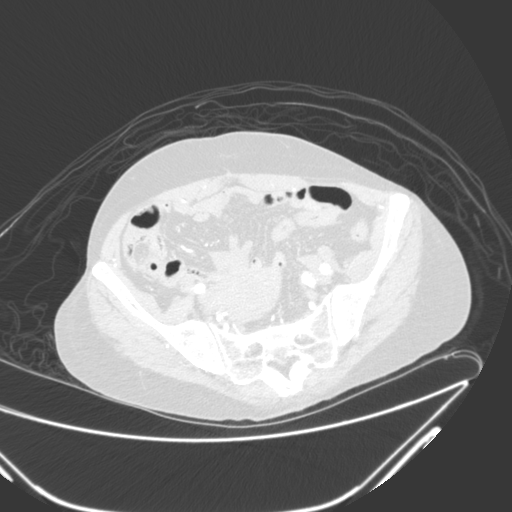
[im 159/318  mediastinal]
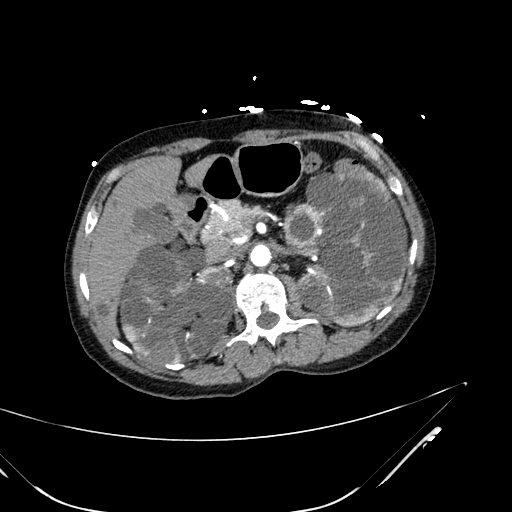
[im 238/318  lung]
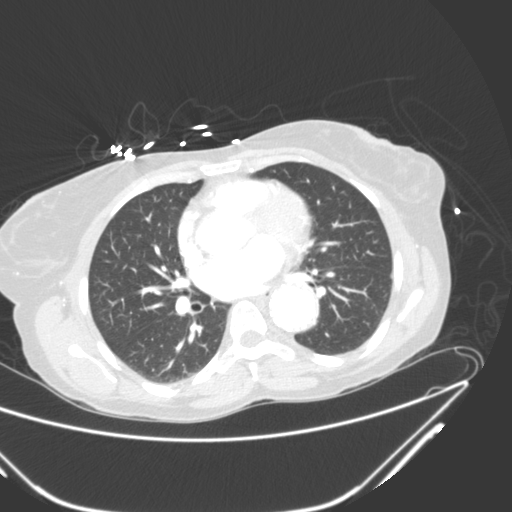

[Series 502: coronals, idose (1) · coronal · 0.50mm/px · 1 of 119 slices shown]
[im 60/119  mediastinal]
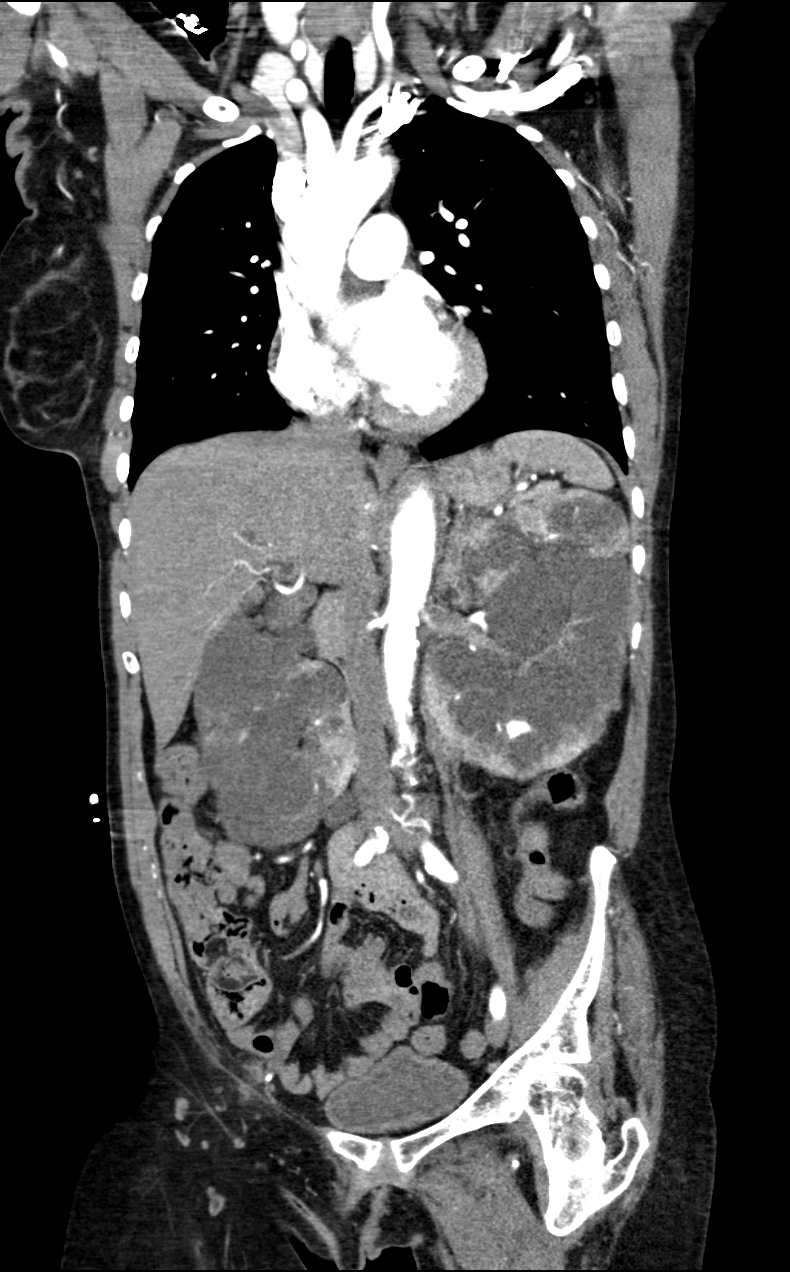

[Series 507: thins, idose (1) · axial · 0.77mm/px · z∈[-893,-385]mm · 8 of 1271 slices shown]
[im 128/1271  lung]
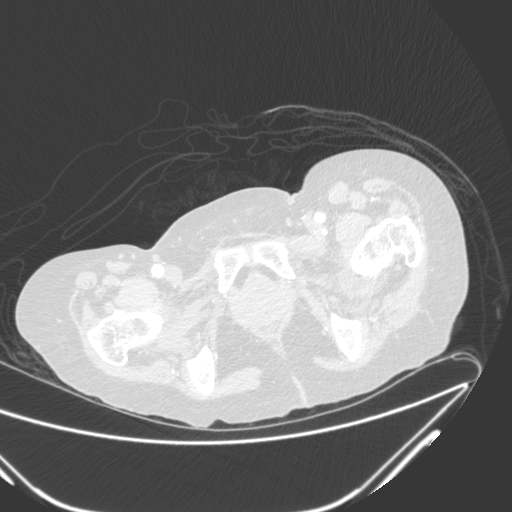
[im 255/1271  lung]
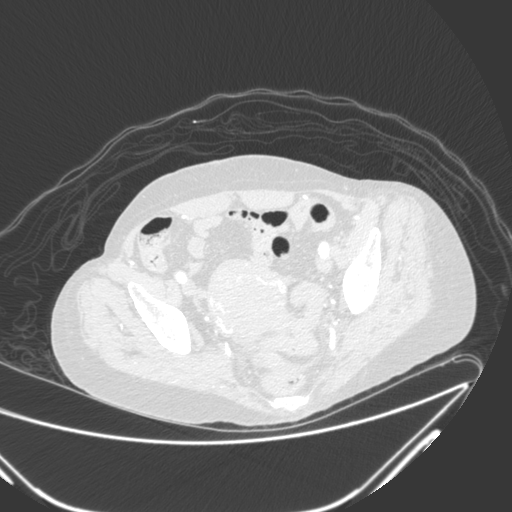
[im 382/1271  lung]
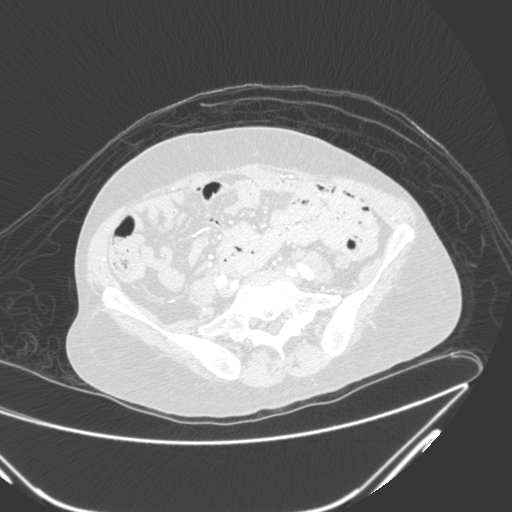
[im 572/1271  lung]
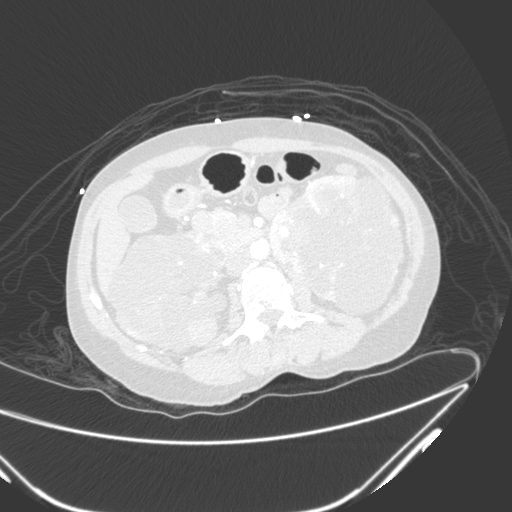
[im 699/1271  lung]
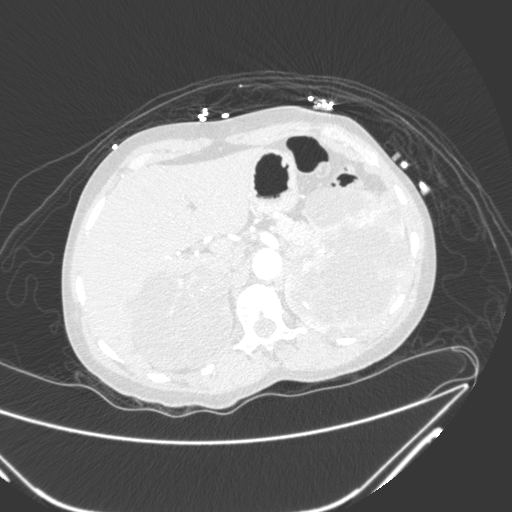
[im 890/1271  lung]
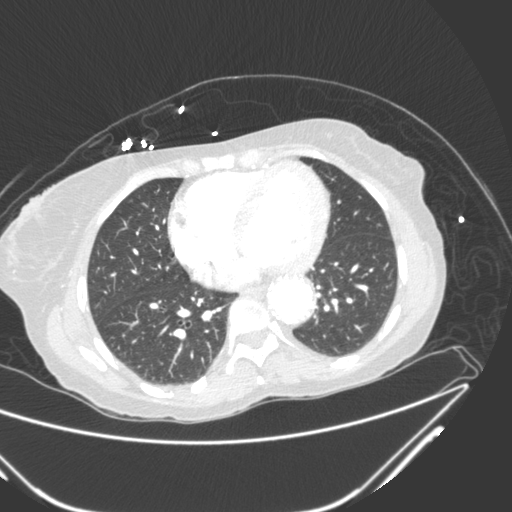
[im 1017/1271  lung]
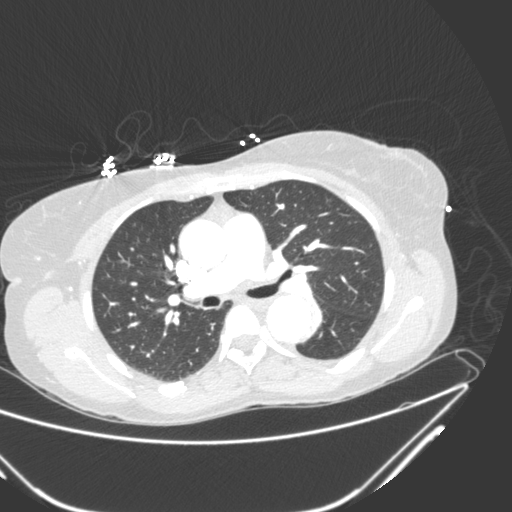
[im 1144/1271  lung]
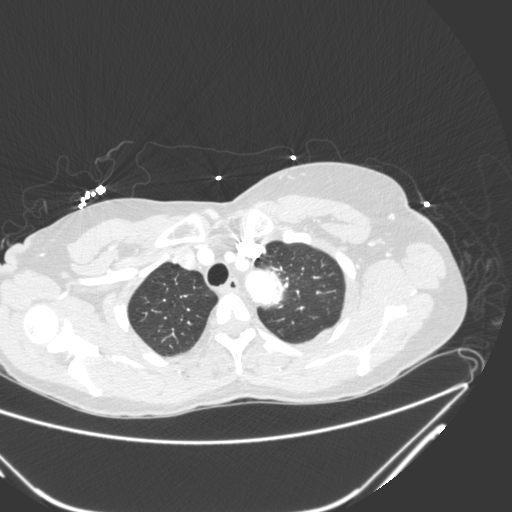

[12 of 36 positions shown; findings below may reference images not displayed]

FINDINGS: CTA CHEST FINDINGS

Patient is status post thoracic aortic stent graft of the descending
thoracic aorta. No evidence of endoleak. Stent graft appears patent.
Thoracic aorta demonstrates no acute process including dissection or
aneurysm. Major branch vessels are patent. No mediastinal hemorrhage
or hematoma. No pericardial or pleural effusion. Visualized
pulmonary arteries appear patent without significant central
pulmonary embolus. Normal heart size.

Lung windows demonstrate a No focal airspace process, collapse or
consolidation. Minor basilar atelectasis. No interstitial process or
edema. Negative for pneumothorax.

No acute osseous finding.

Review of the MIP images confirms the above findings.

CTA ABDOMEN AND PELVIS FINDINGS

Intact abdominal aorta. Mild atherosclerotic change. Negative for
aneurysm, dissection, retroperitoneal hemorrhage or acute vascular
process. Narrowing of celiac origin, estimated at a greater than
50%, stable appearance. SMA and IMA are widely patent. Single main
renal arteries are patent bilaterally.

Common, internal, and external iliac arteries demonstrate
atherosclerosis but no aneurysm, dissection or acute vascular
process. Visualized common femoral, proximal profunda femoral, and
proximal superficial femoral arteries demonstrated are all patent
without acute finding.

Nonvascular: Scattered numerous varying sized hepatic cysts as
before. No biliary dilatation. Gallbladder, biliary system,
pancreas, spleen, and adrenal glands are within normal limits for
age and demonstrate no acute process by arterial phase imaging.
Kidneys are enlarged with extensive polycystic disease, grossly
unchanged. No renal obstruction or hydronephrosis.

No abdominal or pelvic free fluid, fluid collection, hemorrhage,
hematoma, abscess or adenopathy.

Negative for bowel obstruction, dilatation, ileus, or free air. Exam
of the bowel is limited without oral contrast for a CTA exam.

Trace pelvic free fluid, likely physiologic. Uterus and adnexal
normal in size.

No acute osseous finding.

Review of the MIP images confirms the above findings.
IMPRESSION: Stable appearance of the thoracic aorta following descending
thoracic aortic stent graft repair of a type B dissection.

No acute aortic finding or abnormality.

Polycystic kidneys disease with associated diffuse hepatic cysts

No acute intrathoracic, abdominal or pelvic finding

## 2015-03-10 ENCOUNTER — Telehealth: Payer: Self-pay

## 2015-03-10 NOTE — Telephone Encounter (Signed)
This patient wants a [employer]. Representative from [employer] calling to ask if Dr. Patty SermonsBrackbill conducts mobility exams or refers patients to therapy for the exam. Explained to the representative that neither Dr. Patty SermonsBrackbill nor his nurse are here today but will be in touch to follow-up soon.  Phone number: [phone] (speak to any representative) Reference #: [id number]

## 2015-03-10 NOTE — Telephone Encounter (Signed)
Informed [employer] representative that Dr. Patty SermonsBrackbill does not do mobility exams or refer to PT, and that patient will need to get this from her PCP or neurologist. Representative grateful for callback.

## 2015-03-10 NOTE — Telephone Encounter (Signed)
I don't do mobility exams or refer to PT.  She would need to get this from her PCP or neurologist.

## 2015-04-11 ENCOUNTER — Ambulatory Visit: Payer: Medicaid Other | Admitting: Cardiology

## 2015-04-11 ENCOUNTER — Other Ambulatory Visit: Payer: Self-pay

## 2015-05-01 ENCOUNTER — Encounter: Payer: Self-pay | Admitting: Cardiology

## 2015-06-20 ENCOUNTER — Telehealth: Payer: Self-pay | Admitting: Internal Medicine

## 2015-06-20 NOTE — Telephone Encounter (Signed)
Charlotte Niemann, Do you know anything about this paperwork i have not seen any paperwork for hoyer lift for this patient Let me knoe Thanks

## 2015-06-20 NOTE — Telephone Encounter (Signed)
Misty Stanley from [facility] is checking on the status of some paperwork sent back in July 8th. This paperwork is in regards to a hoyer lift, and is a [place] Medicaid certificate of medical necessity. Please follow up with Misty Stanley once completed as it is time sensitive. Thank you.

## 2015-09-16 ENCOUNTER — Observation Stay (HOSPITAL_COMMUNITY)
Admission: EM | Admit: 2015-09-16 | Discharge: 2015-09-18 | Disposition: A | Discharge status: Home / Self Care | Payer: Medicaid Other | Attending: Internal Medicine | Admitting: Internal Medicine

## 2015-09-16 ENCOUNTER — Emergency Department (HOSPITAL_COMMUNITY): Payer: Medicaid Other

## 2015-09-16 ENCOUNTER — Encounter (HOSPITAL_COMMUNITY): Payer: Self-pay

## 2015-09-16 DIAGNOSIS — F419 Anxiety disorder, unspecified: Secondary | ICD-10-CM | POA: Diagnosis not present

## 2015-09-16 DIAGNOSIS — I10 Essential (primary) hypertension: Secondary | ICD-10-CM | POA: Diagnosis present

## 2015-09-16 DIAGNOSIS — R0789 Other chest pain: Secondary | ICD-10-CM

## 2015-09-16 DIAGNOSIS — Z8673 Personal history of transient ischemic attack (TIA), and cerebral infarction without residual deficits: Secondary | ICD-10-CM | POA: Insufficient documentation

## 2015-09-16 DIAGNOSIS — N183 Chronic kidney disease, stage 3 unspecified: Secondary | ICD-10-CM | POA: Diagnosis present

## 2015-09-16 DIAGNOSIS — Z8679 Personal history of other diseases of the circulatory system: Secondary | ICD-10-CM

## 2015-09-16 DIAGNOSIS — R079 Chest pain, unspecified: Secondary | ICD-10-CM | POA: Insufficient documentation

## 2015-09-16 DIAGNOSIS — I7100 Dissection of unspecified site of aorta: Secondary | ICD-10-CM | POA: Insufficient documentation

## 2015-09-16 DIAGNOSIS — F17210 Nicotine dependence, cigarettes, uncomplicated: Secondary | ICD-10-CM | POA: Diagnosis not present

## 2015-09-16 DIAGNOSIS — E876 Hypokalemia: Secondary | ICD-10-CM | POA: Diagnosis not present

## 2015-09-16 DIAGNOSIS — K219 Gastro-esophageal reflux disease without esophagitis: Secondary | ICD-10-CM | POA: Diagnosis not present

## 2015-09-16 DIAGNOSIS — Z23 Encounter for immunization: Secondary | ICD-10-CM | POA: Insufficient documentation

## 2015-09-16 DIAGNOSIS — D649 Anemia, unspecified: Secondary | ICD-10-CM | POA: Diagnosis present

## 2015-09-16 DIAGNOSIS — Z9889 Other specified postprocedural states: Secondary | ICD-10-CM

## 2015-09-16 DIAGNOSIS — I129 Hypertensive chronic kidney disease with stage 1 through stage 4 chronic kidney disease, or unspecified chronic kidney disease: Principal | ICD-10-CM | POA: Insufficient documentation

## 2015-09-16 LAB — CBC
HCT: 44.5 % (ref 36.0–46.0)
Hemoglobin: 14.6 g/dL (ref 12.0–15.0)
MCH: 29 pg (ref 26.0–34.0)
MCHC: 32.8 g/dL (ref 30.0–36.0)
MCV: 88.5 fL (ref 78.0–100.0)
PLATELETS: 206 10*3/uL (ref 150–400)
RBC: 5.03 MIL/uL (ref 3.87–5.11)
RDW: 14.2 % (ref 11.5–15.5)
WBC: 6.7 10*3/uL (ref 4.0–10.5)

## 2015-09-16 LAB — BASIC METABOLIC PANEL
Anion gap: 9 (ref 5–15)
BUN: 18 mg/dL (ref 6–20)
CO2: 25 mmol/L (ref 22–32)
CREATININE: 1.48 mg/dL — AB (ref 0.44–1.00)
Calcium: 9.2 mg/dL (ref 8.9–10.3)
Chloride: 107 mmol/L (ref 101–111)
GFR calc Af Amer: 46 mL/min — ABNORMAL LOW (ref >60)
GFR calc non Af Amer: 40 mL/min — ABNORMAL LOW (ref >60)
Glucose, Bld: 113 mg/dL — ABNORMAL HIGH (ref 65–99)
POTASSIUM: 3 mmol/L — AB (ref 3.5–5.1)
Sodium: 141 mmol/L (ref 135–145)

## 2015-09-16 LAB — I-STAT TROPONIN, ED: TROPONIN I, POC: 0.01 ng/mL (ref 0.00–0.08)

## 2015-09-16 MED ORDER — LABETALOL HCL 5 MG/ML IV SOLN
20.0000 mg | Freq: Once | INTRAVENOUS | Status: DC
Start: 2015-09-16 — End: 2015-09-16
  Filled 2015-09-16: qty 4

## 2015-09-16 MED ORDER — HYDRALAZINE HCL 20 MG/ML IJ SOLN
10.0000 mg | Freq: Once | INTRAMUSCULAR | Status: AC
  Administered 2015-09-16: 10 mg via INTRAVENOUS
  Filled 2015-09-16: qty 1

## 2015-09-16 MED ORDER — LABETALOL HCL 300 MG PO TABS
300.0000 mg | ORAL_TABLET | Freq: Once | ORAL | Status: AC
  Administered 2015-09-16: 300 mg via ORAL
  Filled 2015-09-16: qty 1

## 2015-09-16 MED ORDER — MORPHINE SULFATE (PF) 2 MG/ML IV SOLN
2.0000 mg | INTRAVENOUS | Status: DC | PRN
  Administered 2015-09-17 (×3): 2 mg via INTRAVENOUS
  Filled 2015-09-16 (×3): qty 1

## 2015-09-16 MED ORDER — LABETALOL HCL 5 MG/ML IV SOLN
20.0000 mg | Freq: Once | INTRAVENOUS | Status: AC
  Administered 2015-09-16: 20 mg via INTRAVENOUS
  Filled 2015-09-16: qty 4

## 2015-09-16 MED ORDER — NITROGLYCERIN IN D5W 200-5 MCG/ML-% IV SOLN
10.0000 ug/min | INTRAVENOUS | Status: DC
  Administered 2015-09-17: 10 ug/min via INTRAVENOUS
  Filled 2015-09-16: qty 250

## 2015-09-16 MED ORDER — IBUPROFEN 800 MG PO TABS
800.0000 mg | ORAL_TABLET | Freq: Once | ORAL | Status: AC
  Administered 2015-09-16: 800 mg via ORAL
  Filled 2015-09-16: qty 1

## 2015-09-16 MED ORDER — IOHEXOL 350 MG/ML SOLN
80.0000 mL | Freq: Once | INTRAVENOUS | Status: AC | PRN
  Administered 2015-09-16: 80 mL via INTRAVENOUS

## 2015-09-16 NOTE — ED Notes (Addendum)
Pt states that she is having "acid reflux", pt indicates that her pain is present under each breast.   Pt repeatedly mentioning pain in her legs stating "I need my pain medicine for my legs, I can't sleep".

## 2015-09-16 NOTE — ED Notes (Signed)
MD at bedside. 

## 2015-09-16 NOTE — ED Notes (Signed)
Pt is repeatedly yelling "I have been here since seven o clock! I wanna go!". This RN explained that the pt has the right to leave AMA but suggested that the pt stay to have blood pressure managed. Pt agreed to stay and have medication for her blood pressure.

## 2015-09-16 NOTE — ED Notes (Signed)
Pt requesting update on plan and results. Dr. Sibyl Parrhapman aware

## 2015-09-16 NOTE — ED Notes (Signed)
Pt c/o midsternal chest pain that is described as a burning sensation.  Pain has been present x2 days with HA as well.  Pt has hx of CVA w/ left sided deficits.  Pt was given 324mg  ASA PTA.  Pt reports some mild SOB but denies any dizziness, diaphoresis or weakness.

## 2015-09-16 NOTE — ED Notes (Signed)
Patient transported to CT 

## 2015-09-17 ENCOUNTER — Observation Stay (HOSPITAL_BASED_OUTPATIENT_CLINIC_OR_DEPARTMENT_OTHER): Payer: Medicaid Other

## 2015-09-17 ENCOUNTER — Encounter (HOSPITAL_COMMUNITY): Payer: Self-pay | Admitting: Cardiology

## 2015-09-17 DIAGNOSIS — F419 Anxiety disorder, unspecified: Secondary | ICD-10-CM

## 2015-09-17 DIAGNOSIS — Z9889 Other specified postprocedural states: Secondary | ICD-10-CM

## 2015-09-17 DIAGNOSIS — I10 Essential (primary) hypertension: Secondary | ICD-10-CM

## 2015-09-17 DIAGNOSIS — Z8679 Personal history of other diseases of the circulatory system: Secondary | ICD-10-CM

## 2015-09-17 DIAGNOSIS — R0789 Other chest pain: Secondary | ICD-10-CM | POA: Diagnosis not present

## 2015-09-17 DIAGNOSIS — D649 Anemia, unspecified: Secondary | ICD-10-CM

## 2015-09-17 DIAGNOSIS — K219 Gastro-esophageal reflux disease without esophagitis: Secondary | ICD-10-CM | POA: Diagnosis not present

## 2015-09-17 DIAGNOSIS — Z8673 Personal history of transient ischemic attack (TIA), and cerebral infarction without residual deficits: Secondary | ICD-10-CM

## 2015-09-17 DIAGNOSIS — N189 Chronic kidney disease, unspecified: Secondary | ICD-10-CM

## 2015-09-17 DIAGNOSIS — R079 Chest pain, unspecified: Secondary | ICD-10-CM

## 2015-09-17 DIAGNOSIS — R072 Precordial pain: Secondary | ICD-10-CM

## 2015-09-17 LAB — CBC WITH DIFFERENTIAL/PLATELET
BASOS ABS: 0 10*3/uL (ref 0.0–0.1)
BASOS PCT: 1 %
EOS ABS: 0.3 10*3/uL (ref 0.0–0.7)
EOS PCT: 5 %
HCT: 43.3 % (ref 36.0–46.0)
Hemoglobin: 13.9 g/dL (ref 12.0–15.0)
LYMPHS PCT: 46 %
Lymphs Abs: 2.7 10*3/uL (ref 0.7–4.0)
MCH: 28.6 pg (ref 26.0–34.0)
MCHC: 32.1 g/dL (ref 30.0–36.0)
MCV: 89.1 fL (ref 78.0–100.0)
Monocytes Absolute: 0.3 10*3/uL (ref 0.1–1.0)
Monocytes Relative: 5 %
Neutro Abs: 2.5 10*3/uL (ref 1.7–7.7)
Neutrophils Relative %: 43 %
PLATELETS: 222 10*3/uL (ref 150–400)
RBC: 4.86 MIL/uL (ref 3.87–5.11)
RDW: 14.2 % (ref 11.5–15.5)
WBC: 5.7 10*3/uL (ref 4.0–10.5)

## 2015-09-17 LAB — COMPREHENSIVE METABOLIC PANEL
ALT: 11 U/L — AB (ref 14–54)
AST: 17 U/L (ref 15–41)
Albumin: 3.3 g/dL — ABNORMAL LOW (ref 3.5–5.0)
Alkaline Phosphatase: 77 U/L (ref 38–126)
Anion gap: 12 (ref 5–15)
BUN: 17 mg/dL (ref 6–20)
CHLORIDE: 106 mmol/L (ref 101–111)
CO2: 27 mmol/L (ref 22–32)
CREATININE: 1.51 mg/dL — AB (ref 0.44–1.00)
Calcium: 9.5 mg/dL (ref 8.9–10.3)
GFR calc Af Amer: 45 mL/min — ABNORMAL LOW (ref >60)
GFR calc non Af Amer: 39 mL/min — ABNORMAL LOW (ref >60)
Glucose, Bld: 119 mg/dL — ABNORMAL HIGH (ref 65–99)
Potassium: 3.2 mmol/L — ABNORMAL LOW (ref 3.5–5.1)
SODIUM: 145 mmol/L (ref 135–145)
Total Bilirubin: 0.4 mg/dL (ref 0.3–1.2)
Total Protein: 6.5 g/dL (ref 6.5–8.1)

## 2015-09-17 LAB — RAPID URINE DRUG SCREEN, HOSP PERFORMED
AMPHETAMINES: NOT DETECTED
Barbiturates: NOT DETECTED
Benzodiazepines: NOT DETECTED
Cocaine: NOT DETECTED
OPIATES: NOT DETECTED
Tetrahydrocannabinol: NOT DETECTED

## 2015-09-17 LAB — TROPONIN I
Troponin I: <0.03 ng/mL (ref <0.031)
Troponin I: 0.05 ng/mL — ABNORMAL HIGH (ref <0.031)

## 2015-09-17 LAB — MRSA PCR SCREENING: MRSA BY PCR: NEGATIVE

## 2015-09-17 LAB — MAGNESIUM: MAGNESIUM: 2 mg/dL (ref 1.7–2.4)

## 2015-09-17 MED ORDER — HYDRALAZINE HCL 50 MG PO TABS
50.0000 mg | ORAL_TABLET | Freq: Four times a day (QID) | ORAL | Status: DC
  Administered 2015-09-17 (×3): 50 mg via ORAL
  Filled 2015-09-17 (×3): qty 1

## 2015-09-17 MED ORDER — ONDANSETRON HCL 4 MG/2ML IJ SOLN
4.0000 mg | Freq: Four times a day (QID) | INTRAMUSCULAR | Status: DC | PRN
  Administered 2015-09-17: 4 mg via INTRAVENOUS
  Filled 2015-09-17: qty 2

## 2015-09-17 MED ORDER — SERTRALINE HCL 100 MG PO TABS
100.0000 mg | ORAL_TABLET | Freq: Every day | ORAL | Status: DC
  Administered 2015-09-17 – 2015-09-18 (×2): 100 mg via ORAL
  Filled 2015-09-17 (×2): qty 1

## 2015-09-17 MED ORDER — LABETALOL HCL 200 MG PO TABS
300.0000 mg | ORAL_TABLET | Freq: Two times a day (BID) | ORAL | Status: DC
  Administered 2015-09-17 – 2015-09-18 (×2): 300 mg via ORAL
  Filled 2015-09-17 (×4): qty 1

## 2015-09-17 MED ORDER — GABAPENTIN 100 MG PO CAPS
200.0000 mg | ORAL_CAPSULE | Freq: Three times a day (TID) | ORAL | Status: DC
  Administered 2015-09-17 – 2015-09-18 (×4): 200 mg via ORAL
  Filled 2015-09-17 (×6): qty 2

## 2015-09-17 MED ORDER — SODIUM CHLORIDE 0.9 % IV SOLN
INTRAVENOUS | Status: DC
  Administered 2015-09-17: 16:00:00 via INTRAVENOUS
  Filled 2015-09-17 (×5): qty 1000

## 2015-09-17 MED ORDER — ATORVASTATIN CALCIUM 20 MG PO TABS
20.0000 mg | ORAL_TABLET | Freq: Every day | ORAL | Status: DC
  Administered 2015-09-17: 20 mg via ORAL
  Filled 2015-09-17: qty 1

## 2015-09-17 MED ORDER — POTASSIUM CHLORIDE CRYS ER 20 MEQ PO TBCR
40.0000 meq | EXTENDED_RELEASE_TABLET | Freq: Once | ORAL | Status: AC
  Administered 2015-09-17: 40 meq via ORAL
  Filled 2015-09-17: qty 2

## 2015-09-17 MED ORDER — METOCLOPRAMIDE HCL 5 MG/ML IJ SOLN
10.0000 mg | Freq: Once | INTRAMUSCULAR | Status: AC
  Administered 2015-09-17: 10 mg via INTRAVENOUS
  Filled 2015-09-17: qty 2

## 2015-09-17 MED ORDER — FAMOTIDINE 20 MG PO TABS
20.0000 mg | ORAL_TABLET | Freq: Two times a day (BID) | ORAL | Status: DC
  Administered 2015-09-17 – 2015-09-18 (×4): 20 mg via ORAL
  Filled 2015-09-17 (×5): qty 1

## 2015-09-17 MED ORDER — ALPRAZOLAM 0.25 MG PO TABS
0.2500 mg | ORAL_TABLET | Freq: Three times a day (TID) | ORAL | Status: DC | PRN
  Administered 2015-09-17: 0.25 mg via ORAL
  Filled 2015-09-17: qty 1

## 2015-09-17 MED ORDER — HYDRALAZINE HCL 20 MG/ML IJ SOLN
10.0000 mg | INTRAMUSCULAR | Status: DC | PRN
  Administered 2015-09-17: 10 mg via INTRAVENOUS
  Filled 2015-09-17: qty 1

## 2015-09-17 MED ORDER — SODIUM CHLORIDE 0.9 % IV SOLN
INTRAVENOUS | Status: DC
  Administered 2015-09-17: 11:00:00 via INTRAVENOUS

## 2015-09-17 MED ORDER — INFLUENZA VAC SPLIT QUAD 0.5 ML IM SUSY
0.5000 mL | PREFILLED_SYRINGE | INTRAMUSCULAR | Status: AC
  Administered 2015-09-18: 0.5 mL via INTRAMUSCULAR
  Filled 2015-09-17: qty 0.5

## 2015-09-17 MED ORDER — METHOCARBAMOL 500 MG PO TABS
500.0000 mg | ORAL_TABLET | Freq: Four times a day (QID) | ORAL | Status: DC | PRN
  Administered 2015-09-17: 500 mg via ORAL
  Filled 2015-09-17: qty 1

## 2015-09-17 MED ORDER — HYDRALAZINE HCL 20 MG/ML IJ SOLN: 10.0000 mg | INTRAMUSCULAR | Status: DC | PRN

## 2015-09-17 MED ORDER — AMLODIPINE BESYLATE 5 MG PO TABS: 5.0000 mg | ORAL_TABLET | Freq: Every day | ORAL | Status: DC

## 2015-09-17 MED ORDER — PERFLUTREN LIPID MICROSPHERE
INTRAVENOUS | Status: AC
  Administered 2015-09-17: 2 mL
  Filled 2015-09-17: qty 10

## 2015-09-17 MED ORDER — POLYETHYLENE GLYCOL 3350 17 G PO PACK: 17.0000 g | PACK | Freq: Every day | ORAL | Status: DC

## 2015-09-17 MED ORDER — HYDRALAZINE HCL 50 MG PO TABS
75.0000 mg | ORAL_TABLET | Freq: Four times a day (QID) | ORAL | Status: DC
  Administered 2015-09-17 – 2015-09-18 (×2): 75 mg via ORAL
  Filled 2015-09-17 (×4): qty 1

## 2015-09-17 MED ORDER — PERFLUTREN LIPID MICROSPHERE
1.0000 mL | INTRAVENOUS | Status: AC | PRN
Start: 2015-09-17 — End: 2015-09-17
  Filled 2015-09-17: qty 10

## 2015-09-17 MED ORDER — OXYCODONE HCL 5 MG PO TABS
10.0000 mg | ORAL_TABLET | Freq: Once | ORAL | Status: AC
  Administered 2015-09-17: 10 mg via ORAL
  Filled 2015-09-17: qty 2

## 2015-09-17 MED ORDER — POTASSIUM CHLORIDE CRYS ER 20 MEQ PO TBCR: 40.0000 meq | EXTENDED_RELEASE_TABLET | Freq: Once | ORAL | Status: DC

## 2015-09-17 MED ORDER — TRAMADOL HCL 50 MG PO TABS
50.0000 mg | ORAL_TABLET | Freq: Four times a day (QID) | ORAL | Status: DC | PRN
  Administered 2015-09-17 – 2015-09-18 (×3): 50 mg via ORAL
  Filled 2015-09-17 (×3): qty 1

## 2015-09-17 MED ORDER — HEPARIN SODIUM (PORCINE) 5000 UNIT/ML IJ SOLN
5000.0000 [IU] | Freq: Three times a day (TID) | INTRAMUSCULAR | Status: DC
  Administered 2015-09-17 – 2015-09-18 (×5): 5000 [IU] via SUBCUTANEOUS
  Filled 2015-09-17 (×4): qty 1

## 2015-09-17 MED ORDER — NICOTINE 14 MG/24HR TD PT24
14.0000 mg | MEDICATED_PATCH | Freq: Every day | TRANSDERMAL | Status: DC
  Administered 2015-09-17 – 2015-09-18 (×2): 14 mg via TRANSDERMAL
  Filled 2015-09-17 (×3): qty 1

## 2015-09-17 MED ORDER — ISOSORBIDE MONONITRATE ER 30 MG PO TB24
30.0000 mg | ORAL_TABLET | Freq: Every day | ORAL | Status: DC
  Administered 2015-09-17 – 2015-09-18 (×2): 30 mg via ORAL
  Filled 2015-09-17 (×3): qty 1

## 2015-09-17 MED ORDER — ASPIRIN EC 325 MG PO TBEC
325.0000 mg | DELAYED_RELEASE_TABLET | Freq: Every day | ORAL | Status: DC
  Administered 2015-09-17 – 2015-09-18 (×2): 325 mg via ORAL
  Filled 2015-09-17 (×3): qty 1

## 2015-09-17 MED ORDER — POTASSIUM CHLORIDE 10 MEQ/100ML IV SOLN
10.0000 meq | INTRAVENOUS | Status: AC
  Administered 2015-09-17 (×2): 10 meq via INTRAVENOUS
  Filled 2015-09-17 (×2): qty 100

## 2015-09-17 MED ORDER — ACETAMINOPHEN 325 MG PO TABS
650.0000 mg | ORAL_TABLET | ORAL | Status: DC | PRN
  Administered 2015-09-17: 650 mg via ORAL
  Filled 2015-09-17: qty 2

## 2015-09-17 NOTE — Progress Notes (Signed)
Transferred to 5 west room21 by bed, stable, report given to RN. Belongings with pt.

## 2015-09-17 NOTE — Progress Notes (Signed)
Echocardiogram 2D Echocardiogram with Definity has been performed.  Nolon RodBrown, Tony 09/17/2015, 1:54 PM

## 2015-09-17 NOTE — Progress Notes (Signed)
Patient seen and examined  49 year old female with hx of dissected thoracic aortic aneurysm 06/2014. 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 Had placement thoracic aortic stent Gore C-Tag 34 mm x 20 cm, extending from distal to the left subclavian artery Other hx includes HTN, bipolar disorder, CKD-3, anxiety, GERD, tachycardia and CVA with Lt sided defects. Nuc 12/2014 Without ischemia, EF 75% low risk finding hypertensive with BP 222 systolic. On NTG drip down to 135/76- NTG drip stopped. K+ 3.0 on admit and now 3.2. Troponin neg X 3.  CT of chest and abd without dissection. Urine drug screen negative.   Patient seen by cardiology and did not feel that further workup for ischemia as needed  Patient still hypertensive in the 170s, will transfer to telemetry continue to manage blood pressure Increase hydralazine and labetalol  Anticipate discharge tomorrow blood pressure is stable

## 2015-09-17 NOTE — Progress Notes (Signed)
vomitted the potassium po given, md aware with order.

## 2015-09-17 NOTE — ED Notes (Signed)
Dr. Patel at bedside 

## 2015-09-17 NOTE — H&P (Signed)
[facility] History and Physical  Patient: Charlotte [name]  MRN: [id number]  DOB: 09-Nov-1966  DOS: the patient was seen and examined on 09/17/2015 PCP: Doris Cheadle, MD  Referring physician: Dr Graciela Husbands Chief Complaint: Chest pain  HPI: Charlotte [name] is a 49 y.o. female with Past medical history of hypertension, aortic dissection status post repair, GERD, spinal cord ischemia with chronic weakness, CVA, GERD, depression. Patient presents with complaint of chest pain. History is limited as been taken from patient as well as ED documentation as well as family member. As per the family the patient has not been able to get any of her medication for last 2 weeks. The patient also does not have any resources to go for regular follow-up with his PCP are also prescription medication help. Patient started having complains of chest pain today which is located in the center. The patient also had some shortness of breath. Patient complains of nausea as well as abdominal pain which was like acid reflux. Patient denies any diarrhea or constipation or active bleeding of burning urination. The leg swelling. The patient has complaints of severe pain in her left leg and she reports this has been chronic and she was supposed to go for physical therapy as an outpatient.  The patient is coming from home At her baseline ambulates with support And is independent for most of her ADL; manages her medication on her own.  Review of Systems: as mentioned in the history of present illness.  A comprehensive review of the other systems is negative.  Past Medical History  Diagnosis Date  . Hypertension   . Family history of anesthesia complication     "alot of back pains from the epidurals"  . Anemia   . Depression   . GERD (gastroesophageal reflux disease)   . 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 Bipolar disorder (HCC)   . Anxiety   . Complication of anesthesia     "alot of back pains from the epidurals"  . Daily headache   . Migraine     "sometimes twice/day" (01/16/2015)  . Stroke (HCC) 06/29/2014    residual BLE weakness  . Arthritis     "shoulders; hands" (01/16/2015)  . Chronic lower back pain   . Polycystic kidney disease     /notes 07/05/2014  . Renal insufficiency    Past Surgical History  Procedure Laterality Date  . Cesarean section  1986  . Tubal ligation  1986  . Spinal drain placement  06/29/2014  . Spinal drain removal  07/02/2014  . Thoracentesis  07/05/2014    [name] 07/05/2014  . Thoracic aortic endovascular stent graft N/A 08/23/2014    Procedure: ENDOVASCULAR THORACIC AORTIC  STENT GRAFT;  Surgeon: Sherren Kerns, MD;  Location: [facility] OR;  Service: Vascular;  Laterality: N/A;   Social History:  reports that she has been smoking Cigarettes.  She has a 55.5 pack-year smoking history. She has never used smokeless tobacco. She reports that she drinks alcohol. She reports that she uses illicit drugs ("Crack" cocaine).  Allergies  Allergen Reactions  . Seroquel [Quetiapine] Other (See Comments)    Night sweats, dizziness and possible confusion  . Olanzapine Nausea And Vomiting    Family History  Problem Relation Age of Onset  . Stroke Father   . Hypertension Father   .  Diabetes type II Other   . Hypertension Mother   . Hypertension Sister   . Diabetes Paternal Grandfather     Prior to Admission medications   Medication Sig Start Date End Date Taking? Authorizing Provider  ALPRAZolam (XANAX) 0.25 MG tablet Take 1 tablet (0.25 mg total) by mouth 3 (three) times daily as needed for anxiety. Patient not taking: Reported on 09/16/2015 10/16/14   Doris Cheadle, MD  atorvastatin (LIPITOR) 20 MG tablet Take 1 tablet (20 mg total) by mouth daily at 6 PM. Patient not taking: Reported on 09/16/2015 10/16/14   Doris Cheadle, MD    Diapers & Supplies MISC Use as directed Patient not taking: Reported on 09/16/2015 10/16/14   Doris Cheadle, MD  famotidine (PEPCID) 20 MG tablet Take 1 tablet (20 mg total) by mouth 2 (two) times daily. Patient not taking: Reported on 09/16/2015 10/16/14   Doris Cheadle, MD  feeding supplement, ENSURE COMPLETE, (ENSURE COMPLETE) LIQD Take 237 mLs by mouth 3 (three) times daily between meals. Patient not taking: Reported on 09/16/2015 07/08/14   Christiane Ha, MD  gabapentin (NEURONTIN) 100 MG capsule Take 2 capsules (200 mg total) by mouth 3 (three) times daily. Patient not taking: Reported on 09/16/2015 08/26/14   Rowe Clack, PA-C  labetalol (NORMODYNE) 300 MG tablet Take 1 tablet (300 mg total) by mouth 2 (two) times daily. Patient not taking: Reported on 09/16/2015 10/16/14   Doris Cheadle, MD  methocarbamol (ROBAXIN) 500 MG tablet Take 1 tablet (500 mg total) by mouth every 6 (six) hours as needed for muscle spasms. Patient not taking: Reported on 09/16/2015 10/16/14   Doris Cheadle, MD  nicotine (NICODERM CQ - DOSED IN MG/24 HOURS) 14 mg/24hr patch Place 1 patch (14 mg total) onto the skin daily. Patient not taking: Reported on 09/16/2015 01/20/15   Renae Fickle, MD  oxyCODONE 10 MG TABS Take 1 tablet (10 mg total) by mouth daily. Patient not taking: Reported on 09/16/2015 01/20/15   Renae Fickle, MD  polyethylene glycol [facility] / Ethelene Hal) packet Take 17 g by mouth daily after supper. Patient not taking: Reported on 09/16/2015 07/25/14   Evlyn Kanner Love, PA-C  sertraline (ZOLOFT) 100 MG tablet Take 1 tablet (100 mg total) by mouth daily. Patient not taking: Reported on 09/16/2015 10/16/14   Doris Cheadle, MD  traMADol (ULTRAM) 50 MG tablet Take 1 tablet (50 mg total) by mouth every 6 (six) hours as needed for moderate pain. Patient not taking: Reported on 09/16/2015 10/16/14   Doris Cheadle, MD    Physical Exam: Filed Vitals:   09/17/15 0345 09/17/15 0400 09/17/15 0415  09/17/15 0430  BP: 146/86 151/82 148/81 127/59  Pulse: 83 90 96 90  Temp:      TempSrc:      Resp: 15   14  Height:      Weight:      SpO2: 99% 98% 97% 97%    General: Alert, Awake and Oriented to Time, Place and Person. Appear in mild distress Eyes: PERRL ENT: Oral Mucosa clear moist. Neck: no JVD Cardiovascular: S1 and S2 Present, no Murmur, Peripheral Pulses Present Respiratory: Bilateral Air entry equal and Decreased,  Clear to Auscultation, no Crackles, no wheezes Abdomen: Bowel Sound present, Soft and no tenderness Skin: no Rash Extremities: n Pedal edema, ono calf tenderness Neurologic: Grossly no focal neuro deficit. Labs on Admission:  CBC:  Recent Labs Lab 09/16/15 1950 09/17/15 0148  WBC 6.7 5.7  NEUTROABS  --  2.5  HGB  14.6 13.9  HCT 44.5 43.3  MCV 88.5 89.1  PLT 206 222    CMP     Component Value Date/Time   NA 145 09/17/2015 0148   K 3.2* 09/17/2015 0148   CL 106 09/17/2015 0148   CO2 27 09/17/2015 0148   GLUCOSE 119* 09/17/2015 0148   BUN 17 09/17/2015 0148   CREATININE 1.51* 09/17/2015 0148   CREATININE 1.03 10/16/2014 1628   CALCIUM 9.5 09/17/2015 0148   PROT 6.5 09/17/2015 0148   ALBUMIN 3.3* 09/17/2015 0148   AST 17 09/17/2015 0148   ALT 11* 09/17/2015 0148   ALKPHOS 77 09/17/2015 0148   BILITOT 0.4 09/17/2015 0148   GFRNONAA 39* 09/17/2015 0148   GFRNONAA 64 10/16/2014 1628   GFRAA 45* 09/17/2015 0148   GFRAA 73 10/16/2014 1628     Recent Labs Lab 09/17/15 0148  TROPONINI <0.03   BNP (last 3 results)  Recent Labs  01/16/15 1315  BNP 69.4    ProBNP (last 3 results) No results for input(s): PROBNP in the last 8760 hours.   Radiological Exams on Admission: Dg Chest Port 1 View  09/16/2015  CLINICAL DATA:  49 year old female with chest pain and elevated blood pressure. EXAM: PORTABLE CHEST 1 VIEW COMPARISON:  Chest x-ray 02/05/2015. FINDINGS: Lung volumes are normal. No consolidative airspace disease. No pleural  effusions. No pneumothorax. No pulmonary nodule or mass noted. Pulmonary vasculature and the cardiomediastinal silhouette are within normal limits. Stent graft extending from the distal aortic arch to the distal descending thoracic aorta, similar to prior studies. IMPRESSION: 1. No radiographic evidence of acute cardiopulmonary disease. Electronically Signed   By: Trudie Reed M.D.   On: 09/16/2015 19:39   Ct Angio Chest Aorta W/cm &/or Wo/cm  09/16/2015  CLINICAL DATA:  Pt having mid sternal chest pain with sob and bil low abd pain x 2 days. Hx of Aorta stent. EXAM: CT ANGIOGRAPHY CHEST, ABDOMEN AND PELVIS TECHNIQUE: Multidetector CT imaging through the chest, abdomen and pelvis was performed using the standard protocol during bolus administration of intravenous contrast. Multiplanar reconstructed images and MIPs were obtained and reviewed to evaluate the vascular anatomy. CONTRAST:  80mL OMNIPAQUE IOHEXOL 350 MG/ML SOLN COMPARISON:  02/06/2015 FINDINGS: CTA CHEST FINDINGS A stent extends from the distal aspect of the aortic arch just distal to the left subclavian artery origin distally into the aortic hiatus. Stent is stable in position from the prior CTA. The ascending aorta measures 3.7 cm in diameter, also stable. There is no aortic dissection. Mild partly calcified plaque is seen adjacent to the origin of the innominate artery. Branch vessels are widely patent. There is mild thrombus along the descending thoracic aorta with no significant narrowing. Review of the MIP images confirms the above findings. CT CHEST NON ANGIOGRAPHIC FINDINGS Thoracic inlet:  No mass or adenopathy.  Thyroid is unremarkable. Mediastinum and hila: Heart is normal in size and configuration. No mediastinal or hilar masses or pathologically enlarged lymph nodes. Lungs and pleura: Minor dependent subsegmental atelectasis. No lung consolidation to suggest pneumonia. No pulmonary edema. No mass or suspicious nodule. No pleural  effusion or pneumothorax. CTA ABDOMEN AND PELVIS FINDINGS Abdominal aorta is normal in caliber. There is mild atherosclerotic plaque along the abdominal aorta. No dissection. Mild tortuosity narrowing noted at the origin of the celiac axis, with narrowing measuring 50%. Superior mesenteric artery is widely patent. Mild plaque noted at the origin of the left renal artery without significant stenosis. Mild plaque noted at the origin of  the inferior mesenteric artery without significant stenosis. Mild irregular plaque is noted along the common femoral arteries and left internal iliac artery and common iliac arteries. No significant stenosis. CT ABDOMEN AND PELVIS NON ANGIOGRAPHIC FINDINGS Hepatobiliary: Multiple low-density liver masses consistent with cysts. These are stable. Liver otherwise unremarkable. Gallbladder is unremarkable. No bile duct dilation. Spleen: Unremarkable. Pancreas:  Unremarkable. Adrenal glands:  No adrenal masses. Kidneys, ureters, bladder: Kidneys are enlarged by numerous bilateral cysts consistent with autosomal dominant polycystic kidney disease. No hydronephrosis. Ureters normal in course and caliber. Bladder is unremarkable. Uterus and adnexa: Enlargement of the upper uterine fundus consistent with a fibroid. This is stable. No adnexal masses. Lymph nodes:  No adenopathy. Ascites: None. Gastrointestinal:  Unremarkable. MUSCULOSKELETAL FINDINGS Unremarkable. Review of the MIP images confirms the above findings. IMPRESSION: 1. No acute findings. 2. No aortic dissection.  Thoracic aortic stent is stable. 3. Mild dilation of the thoracic aorta is also unchanged from the prior CT. 4. There are mild atherosclerotic changes noted along the thoracoabdominal aorta and its branch vessels as detailed. No significant stenosis. 5. 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 Stable uterine fibroid. Electronically Signed   By: Amie Portland M.D.   On: 09/16/2015 21:36   Ct Cta  Abd/pel W/cm &/or W/o Cm  09/16/2015  CLINICAL DATA:  Pt having mid sternal chest pain with sob and bil low abd pain x 2 days. Hx of Aorta stent. EXAM: CT ANGIOGRAPHY CHEST, ABDOMEN AND PELVIS TECHNIQUE: Multidetector CT imaging through the chest, abdomen and pelvis was performed using the standard protocol during bolus administration of intravenous contrast. Multiplanar reconstructed images and MIPs were obtained and reviewed to evaluate the vascular anatomy. CONTRAST:  80mL OMNIPAQUE IOHEXOL 350 MG/ML SOLN COMPARISON:  02/06/2015 FINDINGS: CTA CHEST FINDINGS A stent extends from the distal aspect of the aortic arch just distal to the left subclavian artery origin distally into the aortic hiatus. Stent is stable in position from the prior CTA. The ascending aorta measures 3.7 cm in diameter, also stable. There is no aortic dissection. Mild partly calcified plaque is seen adjacent to the origin of the innominate artery. Branch vessels are widely patent. There is mild thrombus along the descending thoracic aorta with no significant narrowing. Review of the MIP images confirms the above findings. CT CHEST NON ANGIOGRAPHIC FINDINGS Thoracic inlet:  No mass or adenopathy.  Thyroid is unremarkable. Mediastinum and hila: Heart is normal in size and configuration. No mediastinal or hilar masses or pathologically enlarged lymph nodes. Lungs and pleura: Minor dependent subsegmental atelectasis. No lung consolidation to suggest pneumonia. No pulmonary edema. No mass or suspicious nodule. No pleural effusion or pneumothorax. CTA ABDOMEN AND PELVIS FINDINGS Abdominal aorta is normal in caliber. There is mild atherosclerotic plaque along the abdominal aorta. No dissection. Mild tortuosity narrowing noted at the origin of the celiac axis, with narrowing measuring 50%. Superior mesenteric artery is widely patent. Mild plaque noted at the origin of the left renal artery without significant stenosis. Mild plaque noted at the  origin of the inferior mesenteric artery without significant stenosis. Mild irregular plaque is noted along the common femoral arteries and left internal iliac artery and common iliac arteries. No significant stenosis. CT ABDOMEN AND PELVIS NON ANGIOGRAPHIC FINDINGS Hepatobiliary: Multiple low-density liver masses consistent with cysts. These are stable. Liver otherwise unremarkable. Gallbladder is unremarkable. No bile duct dilation. Spleen: Unremarkable. Pancreas:  Unremarkable. Adrenal glands:  No adrenal masses. Kidneys, ureters, bladder: Kidneys are enlarged by numerous bilateral cysts  consistent with autosomal dominant polycystic kidney disease. No hydronephrosis. Ureters normal in course and caliber. Bladder is unremarkable. Uterus and adnexa: Enlargement of the upper uterine fundus consistent with a fibroid. This is stable. No adnexal masses. Lymph nodes:  No adenopathy. Ascites: None. Gastrointestinal:  Unremarkable. MUSCULOSKELETAL FINDINGS Unremarkable. Review of the MIP images confirms the above findings. IMPRESSION: 1. No acute findings. 2. No aortic dissection.  Thoracic aortic stent is stable. 3. Mild dilation of the thoracic aorta is also unchanged from the prior CT. 4. There are mild atherosclerotic changes noted along the thoracoabdominal aorta and its branch vessels as detailed. No significant stenosis. 5. 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 Stable uterine fibroid. Electronically Signed   By: Amie Portlandavid  Ormond M.D.   On: 09/16/2015 21:36   EKG: Independently reviewed. normal sinus rhythm, nonspecific ST and T waves changes.  Assessment/Plan 1. Chest pain Patient presented with chest pain. Initial EKG and troponins are negative. CT chest abdomen and pelvis is negative for any dissection. No evidence of pulmonary embolism on my evaluation. No pneumonia as well. With this the patient will be admitted in stepdown unit. I will put her on nitroglycerin drip for  pressure as well as chest pain control. We'll monitor her on telemetry and monitor serial troponin. Echocardiogram in the morning. Patient remains nothing by mouth except medication  2. HTN (hypertension), malignant We will also place the patient on hydralazine IV as well as oral. Along with nitroglycerin drip. Continuing home medications. In the setting of bradycardia holding off on beta blocker.  3  Chronic renal insufficiency, stage III (moderate) We'll continue close monitoring of renal function after contrast study.  4  Anxiety Continuing when necessary benzodiazepine.  5  Esophageal reflux Continuing Pepcid.  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  7. Evidence of protein calorie malnutrition. Patient may benefit from evaluation with nutritionist.  8. Physical deconditioning. Patient will benefit from physical therapy evaluation as well.  Nutrition: Nothing by mouth after midnight DVT Prophylaxis: subcutaneous Heparin  Advance goals of care discussion: Full code   Family Communication: family was present at bedside, opportunity was given to ask question and all questions were answered satisfactorily at the time of interview. Disposition: Admitted as observation, step-down unit.  Author: Lynden OxfordPranav Trexton Escamilla, MD [facility] Pager: [phone] 09/17/2015  If 7PM-7AM, please contact night-coverage [email or web address] Password [email or web address]

## 2015-09-17 NOTE — Consult Note (Signed)
Reason for Consult: chest pain    Referring Physician: Dr. Posey Pronto   PCP:  Lorayne Marek, MD  Primary Cardiologist: Dr. Candelaria Celeste is an 49 y.o. female.    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  Had placement thoracic aortic stent Gore C-Tag 34 mm x 20 cm, extending from distal to the left subclavian artery  Other hx includes HTN, bipolar disorder, CKD-3, anxiety, GERD, tachycardia and CVA with Lt sided defects.  Nuc 12/2014  Without ischemia, EF 75% low risk finding.  Echo 12/2014 with EF 65-70% G1DD  Now admitted with complaints of midsternal chest pain- burning sensation. Pain began yesterday.  + N&V which continue.  No syncope.   She was hypertensive with BP 097 systolic.  On NTG drip down to 135/76- NTG drip stopped.  K+ 3.0 on admit and now 3.2.  Troponin neg X 3. EKG SR with LVH similar to old EKGS.  Pt without medications for 2 months. Needs assistance- she is trying to get her SS.   CT of chest and abd without dissection.  Urine drug screen negative.   Past Medical History  Diagnosis Date  . Hypertension   . Family history of anesthesia complication     "alot of back pains from the epidurals"  . Anemia   . Depression   . GERD (gastroesophageal reflux disease)   . Dissecting aneurysm of thoracic aorta, [place] type B ([place]) 06/29/2014    Archie Endo 07/05/2014  . 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 Bipolar disorder ([place])   . Anxiety   . Complication of anesthesia     "alot of back pains from the epidurals"  . Daily headache   . Migraine     "sometimes twice/day" (01/16/2015)  . Stroke ([place]) 06/29/2014    residual BLE weakness  . Arthritis     "shoulders; hands" (01/16/2015)  . Chronic lower back pain   . Polycystic  kidney disease     /notes 07/05/2014  . Renal insufficiency     Past Surgical History  Procedure Laterality Date  . Cesarean section  1986  . Tubal ligation  1986  . Spinal drain placement  06/29/2014  . Spinal drain removal  07/02/2014  . Thoracentesis  07/05/2014    Archie Endo 07/05/2014  . Thoracic aortic endovascular stent graft N/A 08/23/2014    Procedure: ENDOVASCULAR THORACIC AORTIC  STENT GRAFT;  Surgeon: Elam Dutch, MD;  Location: [facility] OR;  Service: Vascular;  Laterality: N/A;    Family History  Problem Relation Age of Onset  . Stroke Father   . Hypertension Father   . Diabetes type II Other   . Hypertension Mother   . Hypertension Sister   . Diabetes Paternal Grandfather    Social History:  reports that she has been smoking Cigarettes.  She has a 55.5 pack-year smoking history. She has never used smokeless tobacco. She reports that she drinks alcohol. She reports that she uses illicit drugs ("Crack" cocaine).  Allergies:  Allergies  Allergen Reactions  . Seroquel [Quetiapine] Other (See Comments)    Night sweats, dizziness and possible confusion  . Olanzapine Nausea And Vomiting    OUTPATIENT MEDICATIONS:  HAS NOT TAKEN FOR  2 MONTHS No current facility-administered medications on file prior to encounter.   Current Outpatient Prescriptions on File Prior to Encounter  Medication Sig Dispense Refill  . ALPRAZolam (XANAX) 0.25 MG tablet Take 1 tablet (0.25 mg total) by mouth 3 (three) times daily as needed for anxiety. (Patient not taking: Reported on 09/16/2015) 30 tablet 3  . atorvastatin (LIPITOR) 20 MG tablet Take 1 tablet (20 mg total) by mouth daily at 6 PM. (Patient not taking: Reported on 09/16/2015) 30 tablet 3  . Diapers & Supplies MISC Use as directed (Patient not taking: Reported on 09/16/2015) 50 each 12  . famotidine (PEPCID) 20 MG tablet Take 1 tablet (20 mg total) by mouth 2 (two) times daily. (Patient not taking: Reported on 09/16/2015) 60 tablet 3  .  feeding supplement, ENSURE COMPLETE, (ENSURE COMPLETE) LIQD Take 237 mLs by mouth 3 (three) times daily between meals. (Patient not taking: Reported on 09/16/2015)    . gabapentin (NEURONTIN) 100 MG capsule Take 2 capsules (200 mg total) by mouth 3 (three) times daily. (Patient not taking: Reported on 09/16/2015) 180 capsule 1  . labetalol (NORMODYNE) 300 MG tablet Take 1 tablet (300 mg total) by mouth 2 (two) times daily. (Patient not taking: Reported on 09/16/2015) 60 tablet 2  . methocarbamol (ROBAXIN) 500 MG tablet Take 1 tablet (500 mg total) by mouth every 6 (six) hours as needed for muscle spasms. (Patient not taking: Reported on 09/16/2015) 60 tablet 2  . nicotine (NICODERM CQ - DOSED IN MG/24 HOURS) 14 mg/24hr patch Place 1 patch (14 mg total) onto the skin daily. (Patient not taking: Reported on 09/16/2015) 28 patch 0  . oxyCODONE 10 MG TABS Take 1 tablet (10 mg total) by mouth daily. (Patient not taking: Reported on 09/16/2015) 10 tablet 0  . polyethylene glycol (MIRALAX / GLYCOLAX) packet Take 17 g by mouth daily after supper. (Patient not taking: Reported on 09/16/2015) 30 each 0  . sertraline (ZOLOFT) 100 MG tablet Take 1 tablet (100 mg total) by mouth daily. (Patient not taking: Reported on 09/16/2015) 30 tablet 2  . traMADol (ULTRAM) 50 MG tablet Take 1 tablet (50 mg total) by mouth every 6 (six) hours as needed for moderate pain. (Patient not taking: Reported on 09/16/2015) 45 tablet 0   CURRENT MEDICATIONS: Scheduled Meds: . aspirin EC  325 mg Oral Daily  . atorvastatin  20 mg Oral q1800  . famotidine  20 mg Oral BID  . gabapentin  200 mg Oral TID  . heparin  5,000 Units Subcutaneous 3 times per day  . hydrALAZINE  50 mg Oral 4 times per day  . [START ON 09/18/2015] Influenza vac split quadrivalent PF  0.5 mL Intramuscular Tomorrow-1000  . isosorbide mononitrate  30 mg Oral Daily  . nicotine  14 mg Transdermal Daily  . polyethylene glycol  17 g Oral QPC supper  . sertraline   100 mg Oral Daily   Continuous Infusions: . sodium chloride     PRN Meds:.acetaminophen, ALPRAZolam, hydrALAZINE, methocarbamol, morphine injection, ondansetron (ZOFRAN) IV, traMADol   Results for orders placed or performed during the hospital encounter of 09/16/15 (from the past 48 hour(s))  Basic metabolic panel     Status: Abnormal   Collection Time: 09/16/15  7:50 PM  Result Value Ref Range   Sodium 141 135 - 145 mmol/L   Potassium 3.0 (L) 3.5 - 5.1 mmol/L   Chloride 107 101 - 111 mmol/L   CO2 25 22 - 32 mmol/L   Glucose,  Bld 113 (H) 65 - 99 mg/dL   BUN 18 6 - 20 mg/dL   Creatinine, Ser 1.48 (H) 0.44 - 1.00 mg/dL   Calcium 9.2 8.9 - 10.3 mg/dL   GFR calc non Af Amer 40 (L) >60 mL/min   GFR calc Af Amer 46 (L) >60 mL/min    Comment: (NOTE) The eGFR has been calculated using the CKD EPI equation. This calculation has not been validated in all clinical situations. eGFR's persistently <60 mL/min signify possible Chronic Kidney Disease.    Anion gap 9 5 - 15  CBC     Status: None   Collection Time: 09/16/15  7:50 PM  Result Value Ref Range   WBC 6.7 4.0 - 10.5 K/uL   RBC 5.03 3.87 - 5.11 MIL/uL   Hemoglobin 14.6 12.0 - 15.0 g/dL   HCT 44.5 36.0 - 46.0 %   MCV 88.5 78.0 - 100.0 fL   MCH 29.0 26.0 - 34.0 pg   MCHC 32.8 30.0 - 36.0 g/dL   RDW 14.2 11.5 - 15.5 %   Platelets 206 150 - 400 K/uL  I-stat troponin, ED     Status: None   Collection Time: 09/16/15  7:51 PM  Result Value Ref Range   Troponin i, poc 0.01 0.00 - 0.08 ng/mL   Comment 3            Comment: Due to the release kinetics of cTnI, a negative result within the first hours of the onset of symptoms does not rule out myocardial infarction with certainty. If myocardial infarction is still suspected, repeat the test at appropriate intervals.   Urine rapid drug screen (hosp performed)     Status: None   Collection Time: 09/17/15 12:58 AM  Result Value Ref Range   Opiates NONE DETECTED NONE DETECTED    Cocaine NONE DETECTED NONE DETECTED   Benzodiazepines NONE DETECTED NONE DETECTED   Amphetamines NONE DETECTED NONE DETECTED   Tetrahydrocannabinol NONE DETECTED NONE DETECTED   Barbiturates NONE DETECTED NONE DETECTED    Comment:        DRUG SCREEN FOR MEDICAL PURPOSES ONLY.  IF CONFIRMATION IS NEEDED FOR ANY PURPOSE, NOTIFY LAB WITHIN 5 DAYS.        LOWEST DETECTABLE LIMITS FOR URINE DRUG SCREEN Drug Class       Cutoff (ng/mL) Amphetamine      1000 Barbiturate      200 Benzodiazepine   017 Tricyclics       494 Opiates          300 Cocaine          300 THC              50   MRSA PCR Screening     Status: None   Collection Time: 09/17/15  1:31 AM  Result Value Ref Range   MRSA by PCR NEGATIVE NEGATIVE    Comment:        The GeneXpert MRSA Assay (FDA approved for NASAL specimens only), is one component of a comprehensive MRSA colonization surveillance program. It is not intended to diagnose MRSA infection nor to guide or monitor treatment for MRSA infections.   CBC with Differential/Platelet     Status: None   Collection Time: 09/17/15  1:48 AM  Result Value Ref Range   WBC 5.7 4.0 - 10.5 K/uL   RBC 4.86 3.87 - 5.11 MIL/uL   Hemoglobin 13.9 12.0 - 15.0 g/dL   HCT 43.3 36.0 - 46.0 %  MCV 89.1 78.0 - 100.0 fL   MCH 28.6 26.0 - 34.0 pg   MCHC 32.1 30.0 - 36.0 g/dL   RDW 14.2 11.5 - 15.5 %   Platelets 222 150 - 400 K/uL   Neutrophils Relative % 43 %   Neutro Abs 2.5 1.7 - 7.7 K/uL   Lymphocytes Relative 46 %   Lymphs Abs 2.7 0.7 - 4.0 K/uL   Monocytes Relative 5 %   Monocytes Absolute 0.3 0.1 - 1.0 K/uL   Eosinophils Relative 5 %   Eosinophils Absolute 0.3 0.0 - 0.7 K/uL   Basophils Relative 1 %   Basophils Absolute 0.0 0.0 - 0.1 K/uL  Comprehensive metabolic panel     Status: Abnormal   Collection Time: 09/17/15  1:48 AM  Result Value Ref Range   Sodium 145 135 - 145 mmol/L   Potassium 3.2 (L) 3.5 - 5.1 mmol/L   Chloride 106 101 - 111 mmol/L   CO2 27 22 -  32 mmol/L   Glucose, Bld 119 (H) 65 - 99 mg/dL   BUN 17 6 - 20 mg/dL   Creatinine, Ser 1.51 (H) 0.44 - 1.00 mg/dL   Calcium 9.5 8.9 - 10.3 mg/dL   Total Protein 6.5 6.5 - 8.1 g/dL   Albumin 3.3 (L) 3.5 - 5.0 g/dL   AST 17 15 - 41 U/L   ALT 11 (L) 14 - 54 U/L   Alkaline Phosphatase 77 38 - 126 U/L   Total Bilirubin 0.4 0.3 - 1.2 mg/dL   GFR calc non Af Amer 39 (L) >60 mL/min   GFR calc Af Amer 45 (L) >60 mL/min    Comment: (NOTE) The eGFR has been calculated using the CKD EPI equation. This calculation has not been validated in all clinical situations. eGFR's persistently <60 mL/min signify possible Chronic Kidney Disease.    Anion gap 12 5 - 15  Troponin I (q 6hr x 3)     Status: None   Collection Time: 09/17/15  1:48 AM  Result Value Ref Range   Troponin I <0.03 <0.031 ng/mL    Comment:        NO INDICATION OF MYOCARDIAL INJURY.   Magnesium     Status: None   Collection Time: 09/17/15  1:48 AM  Result Value Ref Range   Magnesium 2.0 1.7 - 2.4 mg/dL  Troponin I (q 6hr x 3)     Status: None   Collection Time: 09/17/15  6:10 AM  Result Value Ref Range   Troponin I <0.03 <0.031 ng/mL    Comment:        NO INDICATION OF MYOCARDIAL INJURY.    Dg Chest Port 1 View  09/16/2015  CLINICAL DATA:  49 year old female with chest pain and elevated blood pressure. EXAM: PORTABLE CHEST 1 VIEW COMPARISON:  Chest x-ray 02/05/2015. FINDINGS: Lung volumes are normal. No consolidative airspace disease. No pleural effusions. No pneumothorax. No pulmonary nodule or mass noted. Pulmonary vasculature and the cardiomediastinal silhouette are within normal limits. Stent graft extending from the distal aortic arch to the distal descending thoracic aorta, similar to prior studies. IMPRESSION: 1. No radiographic evidence of acute cardiopulmonary disease. Electronically Signed   By: Vinnie Langton M.D.   On: 09/16/2015 19:39   Ct Angio Chest Aorta W/cm &/or Wo/cm  09/16/2015  CLINICAL DATA:  Pt  having mid sternal chest pain with sob and bil low abd pain x 2 days. Hx of Aorta stent. EXAM: CT ANGIOGRAPHY CHEST, ABDOMEN AND PELVIS TECHNIQUE:  Multidetector CT imaging through the chest, abdomen and pelvis was performed using the standard protocol during bolus administration of intravenous contrast. Multiplanar reconstructed images and MIPs were obtained and reviewed to evaluate the vascular anatomy. CONTRAST:  78m OMNIPAQUE IOHEXOL 350 MG/ML SOLN COMPARISON:  02/06/2015 FINDINGS: CTA CHEST FINDINGS A stent extends from the distal aspect of the aortic arch just distal to the left subclavian artery origin distally into the aortic hiatus. Stent is stable in position from the prior CTA. The ascending aorta measures 3.7 cm in diameter, also stable. There is no aortic dissection. Mild partly calcified plaque is seen adjacent to the origin of the innominate artery. Branch vessels are widely patent. There is mild thrombus along the descending thoracic aorta with no significant narrowing. Review of the MIP images confirms the above findings. CT CHEST NON ANGIOGRAPHIC FINDINGS Thoracic inlet:  No mass or adenopathy.  Thyroid is unremarkable. Mediastinum and hila: Heart is normal in size and configuration. No mediastinal or hilar masses or pathologically enlarged lymph nodes. Lungs and pleura: Minor dependent subsegmental atelectasis. No lung consolidation to suggest pneumonia. No pulmonary edema. No mass or suspicious nodule. No pleural effusion or pneumothorax. CTA ABDOMEN AND PELVIS FINDINGS Abdominal aorta is normal in caliber. There is mild atherosclerotic plaque along the abdominal aorta. No dissection. Mild tortuosity narrowing noted at the origin of the celiac axis, with narrowing measuring 50%. Superior mesenteric artery is widely patent. Mild plaque noted at the origin of the left renal artery without significant stenosis. Mild plaque noted at the origin of the inferior mesenteric artery without significant  stenosis. Mild irregular plaque is noted along the common femoral arteries and left internal iliac artery and common iliac arteries. No significant stenosis. CT ABDOMEN AND PELVIS NON ANGIOGRAPHIC FINDINGS Hepatobiliary: Multiple low-density liver masses consistent with cysts. These are stable. Liver otherwise unremarkable. Gallbladder is unremarkable. No bile duct dilation. Spleen: Unremarkable. Pancreas:  Unremarkable. Adrenal glands:  No adrenal masses. Kidneys, ureters, bladder: Kidneys are enlarged by numerous bilateral cysts consistent with autosomal dominant polycystic kidney disease. No hydronephrosis. Ureters normal in course and caliber. Bladder is unremarkable. Uterus and adnexa: Enlargement of the upper uterine fundus consistent with a fibroid. This is stable. No adnexal masses. Lymph nodes:  No adenopathy. Ascites: None. Gastrointestinal:  Unremarkable. MUSCULOSKELETAL FINDINGS Unremarkable. Review of the MIP images confirms the above findings. IMPRESSION: 1. No acute findings. 2. No aortic dissection.  Thoracic aortic stent is stable. 3. Mild dilation of the thoracic aorta is also unchanged from the prior CT. 4. There are mild atherosclerotic changes noted along the thoracoabdominal aorta and its branch vessels as detailed. No significant stenosis. 5. 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 Stable uterine fibroid. Electronically Signed   By: DLajean ManesM.D.   On: 09/16/2015 21:36   Ct Cta Abd/pel W/cm &/or W/o Cm  09/16/2015  CLINICAL DATA:  Pt having mid sternal chest pain with sob and bil low abd pain x 2 days. Hx of Aorta stent. EXAM: CT ANGIOGRAPHY CHEST, ABDOMEN AND PELVIS TECHNIQUE: Multidetector CT imaging through the chest, abdomen and pelvis was performed using the standard protocol during bolus administration of intravenous contrast. Multiplanar reconstructed images and MIPs were obtained and reviewed to evaluate the vascular anatomy. CONTRAST:  872mOMNIPAQUE  IOHEXOL 350 MG/ML SOLN COMPARISON:  02/06/2015 FINDINGS: CTA CHEST FINDINGS A stent extends from the distal aspect of the aortic arch just distal to the left subclavian artery origin distally into the aortic hiatus. Stent is stable in  position from the prior CTA. The ascending aorta measures 3.7 cm in diameter, also stable. There is no aortic dissection. Mild partly calcified plaque is seen adjacent to the origin of the innominate artery. Branch vessels are widely patent. There is mild thrombus along the descending thoracic aorta with no significant narrowing. Review of the MIP images confirms the above findings. CT CHEST NON ANGIOGRAPHIC FINDINGS Thoracic inlet:  No mass or adenopathy.  Thyroid is unremarkable. Mediastinum and hila: Heart is normal in size and configuration. No mediastinal or hilar masses or pathologically enlarged lymph nodes. Lungs and pleura: Minor dependent subsegmental atelectasis. No lung consolidation to suggest pneumonia. No pulmonary edema. No mass or suspicious nodule. No pleural effusion or pneumothorax. CTA ABDOMEN AND PELVIS FINDINGS Abdominal aorta is normal in caliber. There is mild atherosclerotic plaque along the abdominal aorta. No dissection. Mild tortuosity narrowing noted at the origin of the celiac axis, with narrowing measuring 50%. Superior mesenteric artery is widely patent. Mild plaque noted at the origin of the left renal artery without significant stenosis. Mild plaque noted at the origin of the inferior mesenteric artery without significant stenosis. Mild irregular plaque is noted along the common femoral arteries and left internal iliac artery and common iliac arteries. No significant stenosis. CT ABDOMEN AND PELVIS NON ANGIOGRAPHIC FINDINGS Hepatobiliary: Multiple low-density liver masses consistent with cysts. These are stable. Liver otherwise unremarkable. Gallbladder is unremarkable. No bile duct dilation. Spleen: Unremarkable. Pancreas:  Unremarkable. Adrenal  glands:  No adrenal masses. Kidneys, ureters, bladder: Kidneys are enlarged by numerous bilateral cysts consistent with autosomal dominant polycystic kidney disease. No hydronephrosis. Ureters normal in course and caliber. Bladder is unremarkable. Uterus and adnexa: Enlargement of the upper uterine fundus consistent with a fibroid. This is stable. No adnexal masses. Lymph nodes:  No adenopathy. Ascites: None. Gastrointestinal:  Unremarkable. MUSCULOSKELETAL FINDINGS Unremarkable. Review of the MIP images confirms the above findings. IMPRESSION: 1. No acute findings. 2. No aortic dissection.  Thoracic aortic stent is stable. 3. Mild dilation of the thoracic aorta is also unchanged from the prior CT. 4. There are mild atherosclerotic changes noted along the thoracoabdominal aorta and its branch vessels as detailed. No significant stenosis. 5. 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 Stable uterine fibroid. Electronically Signed   By: Lajean Manes M.D.   On: 09/16/2015 21:36    ROS: General:no colds or fevers, some weight loss Skin:no rashes or ulcers HEENT:no blurred vision, no congestion, + HA CV:see HPI PUL:see HPI GI:no diarrhea constipation or melena, no indigestion GU:no hematuria, no dysuria MS:no joint pain, no claudication Neuro:no syncope, no lightheadedness Endo:no diabetes, no thyroid disease   Blood pressure 133/73, pulse 86, temperature 97.6 F (36.4 C), temperature source Oral, resp. rate 19, height 5' 7"  (1.702 m), weight 124 lb 9 oz (56.5 kg), SpO2 98 %.  Wt Readings from Last 3 Encounters:  09/17/15 124 lb 9 oz (56.5 kg)  01/20/15 137 lb 5.6 oz (62.3 kg)  11/07/14 126 lb (57.153 kg)    PE: General:Pleasant affect, having significant head ache Skin:Warm and dry, brisk capillary refill HEENT:normocephalic, sclera clear, mucus membranes moist Neck:supple, no JVD, no bruits  Heart:S1S2 RRR without murmur, gallup, rub or click Lungs:clear without rales,  rhonchi, or wheezes JHE:RDEY, non tender, + BS, do not palpate liver spleen or masses Ext:no lower ext edema, 1+ pedal pulses, 2+ radial pulses Neuro:alert and oriented, MAE X 3, follows commands, + facial symmetry    Assessment/Plan Principal Problem:   Chest pain-Active Problems:  Normocytic anemia   HTN (hypertension), malignant--ot    Chronic renal insufficiency, stage III (moderate)   Anxiety   Esophageal reflux   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   H/O: CVA (cerebrovascular accident)  1. Chest pain- neg troponins, neg nuc in Feb this year.  Pain now resolved. Await any change in Echo. Most likely pain related to HTN, ? Repeat CT to eval aortic repair.  2. HTN (hypertension), malignant--out of meds.  Needs social work/CM consult for PCP and meds. Very improtant to keep BP controlled  3. 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-    4.  H/O: CVA (cerebrovascular accident)  Now with headache.   5. tobacco abuse- recommend stopping  6. Hypokalemia - will give 40 meq now.   [place] Practitioner Certified [place] Pager [phone] or after 5pm or weekends call [phone] 09/17/2015, 9:00 AM     Patient examined chart reviewed. Discussed care with daughter.  Primary issue is lack of meds and poor living environment. She and her boyfriend are illiterate.  She is non ambulatory Left daughters house about 3 months ago And has not done well since.  Primary issue is headache and some abdominal discomfort and "inability to burp" ECG with LVH strain R/O currently no chest pain. Given poor functional status , atypical symptoms and very little Coronary calcium on CTA done for stent graft do not feel further w/u of ischemia needed.  Would focus on social service help and help affording medicines.  Daughter seems overwhelmed with thought of being mom's caretaker  Jenkins Rouge

## 2015-09-17 NOTE — Progress Notes (Signed)
PT Cancellation Note  Patient Details Name: Rowe RobertHelene J Scheeler MRN: [id number] DOB: 24-Mar-1966   Cancelled Treatment:    Reason Eval/Treat Not Completed: Patient at procedure or test/unavailable (pt in Echo)   Elisabetta Mishra, [place] F 09/17/2015, 1:38 PM Roanna Reaves,PT Acute Rehabilitation [phone] (pager)

## 2015-09-17 NOTE — Progress Notes (Signed)
Transfer from 2H. Vital signs stable; no complaints at this time. No SOB or distress.

## 2015-09-18 DIAGNOSIS — I7101 Dissection of thoracic aorta: Secondary | ICD-10-CM

## 2015-09-18 DIAGNOSIS — I7100 Dissection of unspecified site of aorta: Secondary | ICD-10-CM | POA: Insufficient documentation

## 2015-09-18 DIAGNOSIS — N189 Chronic kidney disease, unspecified: Secondary | ICD-10-CM | POA: Diagnosis not present

## 2015-09-18 DIAGNOSIS — R071 Chest pain on breathing: Secondary | ICD-10-CM

## 2015-09-18 LAB — COMPREHENSIVE METABOLIC PANEL
ALBUMIN: 2.9 g/dL — AB (ref 3.5–5.0)
ALK PHOS: 61 U/L (ref 38–126)
ALT: 9 U/L — AB (ref 14–54)
ANION GAP: 7 (ref 5–15)
AST: 12 U/L — ABNORMAL LOW (ref 15–41)
BUN: 21 mg/dL — ABNORMAL HIGH (ref 6–20)
CALCIUM: 8.9 mg/dL (ref 8.9–10.3)
CHLORIDE: 114 mmol/L — AB (ref 101–111)
CO2: 20 mmol/L — AB (ref 22–32)
Creatinine, Ser: 1.73 mg/dL — ABNORMAL HIGH (ref 0.44–1.00)
GFR calc non Af Amer: 33 mL/min — ABNORMAL LOW (ref >60)
GFR, EST AFRICAN AMERICAN: 38 mL/min — AB (ref >60)
GLUCOSE: 109 mg/dL — AB (ref 65–99)
Potassium: 4.5 mmol/L (ref 3.5–5.1)
SODIUM: 141 mmol/L (ref 135–145)
Total Bilirubin: 0.4 mg/dL (ref 0.3–1.2)
Total Protein: 5.9 g/dL — ABNORMAL LOW (ref 6.5–8.1)

## 2015-09-18 LAB — CBC
HCT: 39.3 % (ref 36.0–46.0)
HEMOGLOBIN: 12.3 g/dL (ref 12.0–15.0)
MCH: 28.3 pg (ref 26.0–34.0)
MCHC: 31.3 g/dL (ref 30.0–36.0)
MCV: 90.3 fL (ref 78.0–100.0)
PLATELETS: 232 10*3/uL (ref 150–400)
RBC: 4.35 MIL/uL (ref 3.87–5.11)
RDW: 15 % (ref 11.5–15.5)
WBC: 5.9 10*3/uL (ref 4.0–10.5)

## 2015-09-18 MED ORDER — ASPIRIN EC 81 MG PO TBEC: 81.0000 mg | DELAYED_RELEASE_TABLET | Freq: Every day | ORAL | Status: AC

## 2015-09-18 MED ORDER — HYDRALAZINE HCL 100 MG PO TABS: 100.0000 mg | ORAL_TABLET | Freq: Two times a day (BID) | ORAL | Status: DC

## 2015-09-18 MED ORDER — NICOTINE 14 MG/24HR TD PT24: 14.0000 mg | MEDICATED_PATCH | Freq: Every day | TRANSDERMAL | Status: DC

## 2015-09-18 MED ORDER — LABETALOL HCL 300 MG PO TABS: 300.0000 mg | ORAL_TABLET | Freq: Two times a day (BID) | ORAL | Status: DC

## 2015-09-18 MED ORDER — HYDRALAZINE HCL 25 MG PO TABS: 100.0000 mg | ORAL_TABLET | Freq: Two times a day (BID) | ORAL | Status: DC

## 2015-09-18 MED ORDER — ISOSORBIDE MONONITRATE ER 30 MG PO TB24: 30.0000 mg | ORAL_TABLET | Freq: Every day | ORAL | Status: DC

## 2015-09-18 MED ORDER — SODIUM CHLORIDE 0.9 % IV SOLN
INTRAVENOUS | Status: AC
  Administered 2015-09-18: 11:00:00 via INTRAVENOUS

## 2015-09-18 NOTE — Progress Notes (Signed)
Discharge instructions and medications discussed with patient.  Prescriptions given to patient.  All questions answered.  Patient is waiting for her daughter to call with the house key.  Patient request that we call for ambulance after her daughter is at her house with the house key.

## 2015-09-18 NOTE — Evaluation (Addendum)
Physical Therapy Evaluation Patient Details Name: Charlotte RobertHelene J [name] MRN: [id number] DOB: 1966-04-06 Today's Date: 09/18/2015   History of Present Illness  Pt adm with chest pain. 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 Pt not willing to stand with me. Wants a hover around scooter at home. Instructed pt this would have to be addressed by her primary care provider.    Follow Up Recommendations No PT follow up (Pt not eligible for HHPT)    Equipment Recommendations  None recommended by PT    Recommendations for Other Services       Precautions / Restrictions Precautions Precautions: Fall Restrictions Weight Bearing Restrictions: No      Mobility  Bed Mobility Overal bed mobility: Modified Independent                Transfers Overall transfer level: Modified independent Equipment used: None Transfers: Squat Pivot Transfers     Squat pivot transfers: Modified independent (Device/Increase time)     General transfer comment: Good technique with transfer  Ambulation/Gait                Stairs            Wheelchair Mobility    Modified Rankin (Stroke Patients Only)       Balance Overall balance assessment: Needs assistance Sitting-balance support: No upper extremity supported;Feet supported Sitting balance-Leahy Scale: Normal                                       Pertinent Vitals/Pain Pain Assessment: No/denies pain    Home Living Family/patient expects to be discharged to:: Private residence Living Arrangements: Spouse/significant other Available Help at Discharge: Family;Available PRN/intermittently Type of Home: Apartment Home Access: Level entry     Home Layout: One level Home Equipment: Wheelchair - [employer]manual;Walker - 2 wheels      Prior Function Level of  Independence: Needs assistance   Gait / Transfers Assistance Needed: Performs w/c transfers modified independent. Stands with walker and assist. Pt unclear if she is amb any with assistance           Hand Dominance   Dominant Hand: Right    Extremity/Trunk Assessment   Upper Extremity Assessment: Overall WFL for tasks assessed           Lower Extremity Assessment: RLE deficits/detail;LLE deficits/detail RLE Deficits / Details: strength 4-/5 LLE Deficits / Details: strength 3-/5     Communication   Communication: No difficulties  Cognition Arousal/Alertness: Awake/alert Behavior During Therapy: WFL for tasks assessed/performed Overall Cognitive Status: Within Functional Limits for tasks assessed                      General Comments      Exercises        Assessment/Plan    PT Assessment Patent does not need any further PT services  PT Diagnosis Difficulty walking   PT Problem List    PT Treatment Interventions     PT Goals (Current goals can be found in the Care Plan section) Acute Rehab PT Goals PT Goal Formulation: All assessment and education complete, DC therapy    Frequency     Barriers to discharge        Co-evaluation  End of Session   Activity Tolerance: Patient tolerated treatment well Patient left: in bed;with call bell/phone within reach;with bed alarm set Nurse Communication: Mobility status         Time: 1610-9604 PT Time Calculation (min) (ACUTE ONLY): 12 min   Charges:   PT Evaluation $Initial PT Evaluation Tier I: 1 Procedure     PT G Codes:        Charlotte [name] 09-30-2015, 11:49 AM Skip Mayer PT [phone]

## 2015-09-18 NOTE — Progress Notes (Signed)
OT Cancellation Note/Discharge  Patient Details Name: Rowe RobertHelene J Sawdey MRN: [id number] DOB: 09-28-1966   Cancelled Treatment:    Reason Eval/Treat Not Completed: OT screened, no needs identified, will sign off  [facility] Center[facility],HILLARY  Areya Lemmerman, OTR/L  [phone] 09/18/2015 09/18/2015, 1:52 PM

## 2015-09-18 NOTE — Progress Notes (Signed)
Per patient her daughter is at her home with the house key. SW notified that patient is ready to go home and to call EMS.

## 2015-09-18 NOTE — Care Management Note (Signed)
Case Management Note  Patient Details  Name: Charlotte [name] MRN: [id number] Date of Birth: 1966/10/06  Subjective/Objective:    Date:09/18/15 Spoke with patient at the bedside. Introduced self as Sports coachcase manager and explained role in discharge planning and how to be reached. Verified patient lives in town, with boyfriend. Has DME manuel  wheelchair , will need transportation at dc, CSW aware, address verified. Expressed potential need for no other DME.  Verified patient anticipates to go home with boyfriend at time of discharge and will have full-time  supervision by family friends neighbors at this time to best of their knowledge. Patient  denied needing help with their medication, she has medicaid. Patient is working on Charity fundraisergetting scat for transport , wants to speak with CSW regarding scat to MD appointments. Verified patient has PCP Advani. [facility]  At [facility],  [facility], Miranda notified, soc will begin 24-48 hrs post dc.  Patient has [place] will not be able to get hhpt unless has fx or cva.     Plan: CM will continue to follow for discharge planning and [facility].                 Action/Plan:   Expected Discharge Date:  09/19/15               Expected Discharge Plan:  Home w Home Health Services  In-House Referral:     Discharge planning Services  CM Consult  Post Acute Care Choice:  Home Health Choice offered to:  Patient  DME Arranged:    DME Agency:     HH Arranged:  RN HH Agency:  [facility]  Status of Service:  Completed, signed off  Medicare Important Message Given:    Date Medicare IM Given:    Medicare IM give by:    Date Additional Medicare IM Given:    Additional Medicare Important Message give by:     If discussed at Long Length of Stay Meetings, dates discussed:    Additional Comments:  Leone Havenaylor, Jayley Hustead Clinton, RN 09/18/2015, 11:04 AM

## 2015-09-18 NOTE — Clinical Social Work Note (Signed)
CSW received phone call that patient needed ambulance transport home.  CSW contacted [facility] to schedule transport, patient requested information about SCAT application to meet her transport needs.  CSW gave patient SCAT application, CSW to sign off.  Ervin KnackEric R. Lakeshia Dohner, MSW, Theresia MajorsLCSWA [phone] 09/18/2015 2:43 PM

## 2015-09-18 NOTE — Discharge Summary (Addendum)
Physician Discharge Summary  [name] MRN: [id number] DOB/AGE: [birth year] 51 y.o.  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     aspirin EC 81 MG tablet  Take 1 tablet (81 mg total) by mouth daily.     atorvastatin 20 MG tablet  Commonly known as:  LIPITOR  Take 1 tablet (20 mg total) by mouth daily at 6 PM.     Diapers & Supplies Misc  Use as directed     famotidine 20 MG tablet  Commonly known as:  PEPCID  Take 1 tablet (20 mg total) by mouth 2 (two) times daily.     feeding supplement (ENSURE COMPLETE) Liqd  Take 237 mLs by mouth 3 (three) times daily between meals.     gabapentin 100 MG capsule  Commonly known as:  NEURONTIN  Take 2 capsules (200 mg total) by mouth 3 (three) times daily.     hydrALAZINE 50 MG tablet  Commonly known as:  APRESOLINE  Take 2 tablets (100 mg total) by mouth 2 (two) times daily.     isosorbide mononitrate 30 MG 24 hr tablet  Commonly known as:  IMDUR  Take 1 tablet (30 mg total) by mouth daily.     labetalol 300 MG tablet  Commonly known as:  NORMODYNE  Take 1 tablet (300 mg total) by mouth 2 (two) times daily.     methocarbamol 500 MG tablet  Commonly known as:  ROBAXIN  Take  1 tablet (500 mg total) by mouth every 6 (six) hours as needed for muscle spasms.     nicotine 14 mg/24hr patch  Commonly known as:  NICODERM CQ - dosed in mg/24 hours  Place 1 patch (14 mg total) onto the skin daily.     Oxycodone HCl 10 MG Tabs  Take 1 tablet (10 mg total) by mouth daily.     polyethylene glycol packet  Commonly known as:  MIRALAX / GLYCOLAX  Take 17 g by mouth daily after supper.     sertraline 100 MG tablet  Commonly known as:  ZOLOFT  Take 1 tablet (100 mg total) by mouth daily.     traMADol 50 MG tablet  Commonly known as:  ULTRAM  Take 1 tablet (50 mg total) by mouth every 6 (six) hours as needed for moderate pain.         Discharge Condition:    Discharge Instructions     Allergies  Allergen Reactions  . Seroquel [Quetiapine] Other (See Comments)    Night sweats, dizziness and possible confusion  . Olanzapine Nausea And Vomiting      Disposition: 01-Home or Self Care   Consults: Cardiology   Significant  Diagnostic Studies:  Dg Chest Port 1 View  09/16/2015  CLINICAL DATA:  49 year old female with chest pain and elevated blood pressure. EXAM: PORTABLE CHEST 1 VIEW COMPARISON:  Chest x-ray 02/05/2015. FINDINGS: Lung volumes are normal. No consolidative airspace disease. No pleural effusions. No pneumothorax. No pulmonary nodule or mass noted. Pulmonary vasculature and the cardiomediastinal silhouette are within normal limits. Stent graft extending from the distal aortic arch to the distal descending thoracic aorta, similar to prior studies. IMPRESSION: 1. No radiographic evidence of acute cardiopulmonary disease. Electronically Signed   By: Vinnie Langton M.D.   On: 09/16/2015 19:39   Ct Angio Chest Aorta W/cm &/or Wo/cm  09/16/2015  CLINICAL DATA:  Pt having mid sternal chest pain with sob and bil low abd pain x 2 days. Hx of Aorta stent. EXAM: CT ANGIOGRAPHY CHEST, ABDOMEN AND PELVIS TECHNIQUE: Multidetector CT imaging through the  chest, abdomen and pelvis was performed using the standard protocol during bolus administration of intravenous contrast. Multiplanar reconstructed images and MIPs were obtained and reviewed to evaluate the vascular anatomy. CONTRAST:  28m OMNIPAQUE IOHEXOL 350 MG/ML SOLN COMPARISON:  02/06/2015 FINDINGS: CTA CHEST FINDINGS A stent extends from the distal aspect of the aortic arch just distal to the left subclavian artery origin distally into the aortic hiatus. Stent is stable in position from the prior CTA. The ascending aorta measures 3.7 cm in diameter, also stable. There is no aortic dissection. Mild partly calcified plaque is seen adjacent to the origin of the innominate artery. Branch vessels are widely patent. There is mild thrombus along the descending thoracic aorta with no significant narrowing. Review of the MIP images confirms the above findings. CT CHEST NON ANGIOGRAPHIC FINDINGS Thoracic inlet:  No mass or adenopathy.  Thyroid is unremarkable. Mediastinum and hila: Heart is normal in size and configuration. No mediastinal or hilar masses or pathologically enlarged lymph nodes. Lungs and pleura: Minor dependent subsegmental atelectasis. No lung consolidation to suggest pneumonia. No pulmonary edema. No mass or suspicious nodule. No pleural effusion or pneumothorax. CTA ABDOMEN AND PELVIS FINDINGS Abdominal aorta is normal in caliber. There is mild atherosclerotic plaque along the abdominal aorta. No dissection. Mild tortuosity narrowing noted at the origin of the celiac axis, with narrowing measuring 50%. Superior mesenteric artery is widely patent. Mild plaque noted at the origin of the left renal artery without significant stenosis. Mild plaque noted at the origin of the inferior mesenteric artery without significant stenosis. Mild irregular plaque is noted along the common femoral arteries and left internal iliac artery and common iliac arteries. No significant stenosis. CT ABDOMEN AND PELVIS NON  ANGIOGRAPHIC FINDINGS Hepatobiliary: Multiple low-density liver masses consistent with cysts. These are stable. Liver otherwise unremarkable. Gallbladder is unremarkable. No bile duct dilation. Spleen: Unremarkable. Pancreas:  Unremarkable. Adrenal glands:  No adrenal masses. Kidneys, ureters, bladder: Kidneys are enlarged by numerous bilateral cysts consistent with autosomal dominant polycystic kidney disease. No hydronephrosis. Ureters normal in course and caliber. Bladder is unremarkable. Uterus and adnexa: Enlargement of the upper uterine fundus consistent with a fibroid. This is stable. No adnexal masses. Lymph nodes:  No adenopathy. Ascites: None. Gastrointestinal:  Unremarkable. MUSCULOSKELETAL FINDINGS Unremarkable. Review of the MIP images confirms the above findings. IMPRESSION: 1. No acute findings. 2. No aortic dissection.  Thoracic aortic stent is stable. 3. Mild dilation of the thoracic aorta is also unchanged from the prior CT. 4. There are mild atherosclerotic changes noted along the thoracoabdominal aorta and its branch vessels as detailed. No significant stenosis.  5. 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 Stable uterine fibroid. Electronically Signed   By: Lajean Manes M.D.   On: 09/16/2015 21:36   Ct Cta Abd/pel W/cm &/or W/o Cm  09/16/2015  CLINICAL DATA:  Pt having mid sternal chest pain with sob and bil low abd pain x 2 days. Hx of Aorta stent. EXAM: CT ANGIOGRAPHY CHEST, ABDOMEN AND PELVIS TECHNIQUE: Multidetector CT imaging through the chest, abdomen and pelvis was performed using the standard protocol during bolus administration of intravenous contrast. Multiplanar reconstructed images and MIPs were obtained and reviewed to evaluate the vascular anatomy. CONTRAST:  56m OMNIPAQUE IOHEXOL 350 MG/ML SOLN COMPARISON:  02/06/2015 FINDINGS: CTA CHEST FINDINGS A stent extends from the distal aspect of the aortic arch just distal to the left subclavian artery origin  distally into the aortic hiatus. Stent is stable in position from the prior CTA. The ascending aorta measures 3.7 cm in diameter, also stable. There is no aortic dissection. Mild partly calcified plaque is seen adjacent to the origin of the innominate artery. Branch vessels are widely patent. There is mild thrombus along the descending thoracic aorta with no significant narrowing. Review of the MIP images confirms the above findings. CT CHEST NON ANGIOGRAPHIC FINDINGS Thoracic inlet:  No mass or adenopathy.  Thyroid is unremarkable. Mediastinum and hila: Heart is normal in size and configuration. No mediastinal or hilar masses or pathologically enlarged lymph nodes. Lungs and pleura: Minor dependent subsegmental atelectasis. No lung consolidation to suggest pneumonia. No pulmonary edema. No mass or suspicious nodule. No pleural effusion or pneumothorax. CTA ABDOMEN AND PELVIS FINDINGS Abdominal aorta is normal in caliber. There is mild atherosclerotic plaque along the abdominal aorta. No dissection. Mild tortuosity narrowing noted at the origin of the celiac axis, with narrowing measuring 50%. Superior mesenteric artery is widely patent. Mild plaque noted at the origin of the left renal artery without significant stenosis. Mild plaque noted at the origin of the inferior mesenteric artery without significant stenosis. Mild irregular plaque is noted along the common femoral arteries and left internal iliac artery and common iliac arteries. No significant stenosis. CT ABDOMEN AND PELVIS NON ANGIOGRAPHIC FINDINGS Hepatobiliary: Multiple low-density liver masses consistent with cysts. These are stable. Liver otherwise unremarkable. Gallbladder is unremarkable. No bile duct dilation. Spleen: Unremarkable. Pancreas:  Unremarkable. Adrenal glands:  No adrenal masses. Kidneys, ureters, bladder: Kidneys are enlarged by numerous bilateral cysts consistent with autosomal dominant polycystic kidney disease. No hydronephrosis.  Ureters normal in course and caliber. Bladder is unremarkable. Uterus and adnexa: Enlargement of the upper uterine fundus consistent with a fibroid. This is stable. No adnexal masses. Lymph nodes:  No adenopathy. Ascites: None. Gastrointestinal:  Unremarkable. MUSCULOSKELETAL FINDINGS Unremarkable. Review of the MIP images confirms the above findings. IMPRESSION: 1. No acute findings. 2. No aortic dissection.  Thoracic aortic stent is stable. 3. Mild dilation of the thoracic aorta is also unchanged from the prior CT. 4. There are mild atherosclerotic changes noted along the thoracoabdominal aorta and its branch vessels as detailed. No significant stenosis. 5. 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 Stable uterine fibroid. Electronically Signed   By: DLajean ManesM.D.   On: 09/16/2015 21:36      Filed Weights   09/17/15 0118 09/17/15 0330 09/18/15 0500  Weight: 56.5 kg (124 lb 9 oz) 56.5 kg (124 lb 9 oz) 60.4 kg (133 lb 2.5 oz)     Microbiology: Recent Results (from the past 240 hour(s))  MRSA PCR Screening  Status: None   Collection Time: 09/17/15  1:31 AM  Result Value Ref Range Status   MRSA by PCR NEGATIVE NEGATIVE Final    Comment:        The GeneXpert MRSA Assay (FDA approved for NASAL specimens only), is one component of a comprehensive MRSA colonization surveillance program. It is not intended to diagnose MRSA infection nor to guide or monitor treatment for MRSA infections.        Blood Culture    Component Value Date/Time   SDES URINE, CATHETERIZED 08/24/2014 0955   SPECREQUEST NONE 08/24/2014 0955   CULT NO GROWTH Performed at [facility] 08/24/2014 0955   REPTSTATUS 08/25/2014 FINAL 08/24/2014 0955      Labs: Results for orders placed or performed during the hospital encounter of 09/16/15 (from the past 48 hour(s))  Basic metabolic panel     Status: Abnormal   Collection Time: 09/16/15  7:50 PM  Result Value Ref Range    Sodium 141 135 - 145 mmol/L   Potassium 3.0 (L) 3.5 - 5.1 mmol/L   Chloride 107 101 - 111 mmol/L   CO2 25 22 - 32 mmol/L   Glucose, Bld 113 (H) 65 - 99 mg/dL   BUN 18 6 - 20 mg/dL   Creatinine, Ser 1.48 (H) 0.44 - 1.00 mg/dL   Calcium 9.2 8.9 - 10.3 mg/dL   GFR calc non Af Amer 40 (L) >60 mL/min   GFR calc Af Amer 46 (L) >60 mL/min    Comment: (NOTE) The eGFR has been calculated using the CKD EPI equation. This calculation has not been validated in all clinical situations. eGFR's persistently <60 mL/min signify possible Chronic Kidney Disease.    Anion gap 9 5 - 15  CBC     Status: None   Collection Time: 09/16/15  7:50 PM  Result Value Ref Range   WBC 6.7 4.0 - 10.5 K/uL   RBC 5.03 3.87 - 5.11 MIL/uL   Hemoglobin 14.6 12.0 - 15.0 g/dL   HCT 44.5 36.0 - 46.0 %   MCV 88.5 78.0 - 100.0 fL   MCH 29.0 26.0 - 34.0 pg   MCHC 32.8 30.0 - 36.0 g/dL   RDW 14.2 11.5 - 15.5 %   Platelets 206 150 - 400 K/uL  I-stat troponin, ED     Status: None   Collection Time: 09/16/15  7:51 PM  Result Value Ref Range   Troponin i, poc 0.01 0.00 - 0.08 ng/mL   Comment 3            Comment: Due to the release kinetics of cTnI, a negative result within the first hours of the onset of symptoms does not rule out myocardial infarction with certainty. If myocardial infarction is still suspected, repeat the test at appropriate intervals.   Urine rapid drug screen (hosp performed)     Status: None   Collection Time: 09/17/15 12:58 AM  Result Value Ref Range   Opiates NONE DETECTED NONE DETECTED   Cocaine NONE DETECTED NONE DETECTED   Benzodiazepines NONE DETECTED NONE DETECTED   Amphetamines NONE DETECTED NONE DETECTED   Tetrahydrocannabinol NONE DETECTED NONE DETECTED   Barbiturates NONE DETECTED NONE DETECTED    Comment:        DRUG SCREEN FOR MEDICAL PURPOSES ONLY.  IF CONFIRMATION IS NEEDED FOR ANY PURPOSE, NOTIFY LAB WITHIN 5 DAYS.        LOWEST DETECTABLE LIMITS FOR URINE DRUG  SCREEN Drug Class  Cutoff (ng/mL) Amphetamine      1000 Barbiturate      200 Benzodiazepine   301 Tricyclics       601 Opiates          300 Cocaine          300 THC              50   MRSA PCR Screening     Status: None   Collection Time: 09/17/15  1:31 AM  Result Value Ref Range   MRSA by PCR NEGATIVE NEGATIVE    Comment:        The GeneXpert MRSA Assay (FDA approved for NASAL specimens only), is one component of a comprehensive MRSA colonization surveillance program. It is not intended to diagnose MRSA infection nor to guide or monitor treatment for MRSA infections.   CBC with Differential/Platelet     Status: None   Collection Time: 09/17/15  1:48 AM  Result Value Ref Range   WBC 5.7 4.0 - 10.5 K/uL   RBC 4.86 3.87 - 5.11 MIL/uL   Hemoglobin 13.9 12.0 - 15.0 g/dL   HCT 43.3 36.0 - 46.0 %   MCV 89.1 78.0 - 100.0 fL   MCH 28.6 26.0 - 34.0 pg   MCHC 32.1 30.0 - 36.0 g/dL   RDW 14.2 11.5 - 15.5 %   Platelets 222 150 - 400 K/uL   Neutrophils Relative % 43 %   Neutro Abs 2.5 1.7 - 7.7 K/uL   Lymphocytes Relative 46 %   Lymphs Abs 2.7 0.7 - 4.0 K/uL   Monocytes Relative 5 %   Monocytes Absolute 0.3 0.1 - 1.0 K/uL   Eosinophils Relative 5 %   Eosinophils Absolute 0.3 0.0 - 0.7 K/uL   Basophils Relative 1 %   Basophils Absolute 0.0 0.0 - 0.1 K/uL  Comprehensive metabolic panel     Status: Abnormal   Collection Time: 09/17/15  1:48 AM  Result Value Ref Range   Sodium 145 135 - 145 mmol/L   Potassium 3.2 (L) 3.5 - 5.1 mmol/L   Chloride 106 101 - 111 mmol/L   CO2 27 22 - 32 mmol/L   Glucose, Bld 119 (H) 65 - 99 mg/dL   BUN 17 6 - 20 mg/dL   Creatinine, Ser 1.51 (H) 0.44 - 1.00 mg/dL   Calcium 9.5 8.9 - 10.3 mg/dL   Total Protein 6.5 6.5 - 8.1 g/dL   Albumin 3.3 (L) 3.5 - 5.0 g/dL   AST 17 15 - 41 U/L   ALT 11 (L) 14 - 54 U/L   Alkaline Phosphatase 77 38 - 126 U/L   Total Bilirubin 0.4 0.3 - 1.2 mg/dL   GFR calc non Af Amer 39 (L) >60 mL/min   GFR calc Af  Amer 45 (L) >60 mL/min    Comment: (NOTE) The eGFR has been calculated using the CKD EPI equation. This calculation has not been validated in all clinical situations. eGFR's persistently <60 mL/min signify possible Chronic Kidney Disease.    Anion gap 12 5 - 15  Troponin I (q 6hr x 3)     Status: None   Collection Time: 09/17/15  1:48 AM  Result Value Ref Range   Troponin I <0.03 <0.031 ng/mL    Comment:        NO INDICATION OF MYOCARDIAL INJURY.   Magnesium     Status: None   Collection Time: 09/17/15  1:48 AM  Result Value Ref Range   Magnesium  2.0 1.7 - 2.4 mg/dL  Troponin I (q 6hr x 3)     Status: None   Collection Time: 09/17/15  6:10 AM  Result Value Ref Range   Troponin I <0.03 <0.031 ng/mL    Comment:        NO INDICATION OF MYOCARDIAL INJURY.   Troponin I (q 6hr x 3)     Status: Abnormal   Collection Time: 09/17/15 12:16 PM  Result Value Ref Range   Troponin I 0.05 (H) <0.031 ng/mL    Comment:        PERSISTENTLY INCREASED TROPONIN VALUES IN THE RANGE OF 0.04-0.49 ng/mL CAN BE SEEN IN:       -UNSTABLE ANGINA       -CONGESTIVE HEART FAILURE       -MYOCARDITIS       -CHEST TRAUMA       -ARRYHTHMIAS       -LATE PRESENTING MYOCARDIAL INFARCTION       -COPD   CLINICAL FOLLOW-UP RECOMMENDED.   CBC     Status: None   Collection Time: 09/18/15  4:53 AM  Result Value Ref Range   WBC 5.9 4.0 - 10.5 K/uL   RBC 4.35 3.87 - 5.11 MIL/uL   Hemoglobin 12.3 12.0 - 15.0 g/dL   HCT 39.3 36.0 - 46.0 %   MCV 90.3 78.0 - 100.0 fL   MCH 28.3 26.0 - 34.0 pg   MCHC 31.3 30.0 - 36.0 g/dL   RDW 15.0 11.5 - 15.5 %   Platelets 232 150 - 400 K/uL  Comprehensive metabolic panel     Status: Abnormal   Collection Time: 09/18/15  4:53 AM  Result Value Ref Range   Sodium 141 135 - 145 mmol/L   Potassium 4.5 3.5 - 5.1 mmol/L    Comment: DELTA CHECK NOTED   Chloride 114 (H) 101 - 111 mmol/L   CO2 20 (L) 22 - 32 mmol/L   Glucose, Bld 109 (H) 65 - 99 mg/dL   BUN 21 (H) 6 - 20  mg/dL   Creatinine, Ser 1.73 (H) 0.44 - 1.00 mg/dL   Calcium 8.9 8.9 - 10.3 mg/dL   Total Protein 5.9 (L) 6.5 - 8.1 g/dL   Albumin 2.9 (L) 3.5 - 5.0 g/dL   AST 12 (L) 15 - 41 U/L   ALT 9 (L) 14 - 54 U/L   Alkaline Phosphatase 61 38 - 126 U/L   Total Bilirubin 0.4 0.3 - 1.2 mg/dL   GFR calc non Af Amer 33 (L) >60 mL/min   GFR calc Af Amer 38 (L) >60 mL/min    Comment: (NOTE) The eGFR has been calculated using the CKD EPI equation. This calculation has not been validated in all clinical situations. eGFR's persistently <60 mL/min signify possible Chronic Kidney Disease.    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 Had placement thoracic aortic stent Gore C-Tag 34 mm x 20 cm, extending from distal to the left subclavian artery Other hx includes HTN, bipolar disorder, CKD-3, anxiety, GERD, tachycardia and CVA with Lt sided defects. Nuc 12/2014 Without ischemia, EF 75% low risk finding. Echo 12/2014 with EF 65-70% G1DD  Now admitted with complaints of midsternal chest pain- burning sensation. Pain began yesterday. + N&V which continue. No syncope.   She was hypertensive with BP 852 systolic. On NTG drip down to 135/76- NTG drip stopped. K+ 3.0 on admit and now 3.2. Troponin neg X 3. EKG SR with LVH similar to old EKGS. Pt without medications for 2 months. Needs assistance- she is trying to get her SS. CT of chest and abd without dissection. Urine  drug screen negative.   HOSPITAL COURSE:   1. Chest pain- neg troponins, neg nuc in Feb this year. Pain now resolved. 2-D echo showed an EF of 65-70%, no regional wall motion abnormalities, grade 1 diastolic dysfunction, Most likely pain related to malignant hypertension, ? Repeat CT to eval aortic repair, showed mild dilation of the thoracic aorta which was unchanged from prior CT. No significant stenosis of the thoracic or abdominal aorta.. Patient evaluated by cardiology, based on coronary calcium on CTA, cardiology did not feel that further workup for ischemia was needed. He recommended to focus on compliance with her antihypertensive medications.  2. HTN (hypertension), malignant--patient restarted on hydralazine, labetalol, cardiology started patient on Imdur. Home health is being arranged to ensure compliance with PCP and medications. Very improtant to keep BP controlled  3. 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 H/O: CVA (cerebrovascular accident) Now with headache.   5. tobacco abuse- recommend stopping, nicotine patch provided  6. Hypokalemia - will give 40 meq now. Repleted  7. Chronic kidney disease, stage IV, baseline creatinine around 1.75, creatinine was 1.48 upon admission, now 1.73. Patient did receive contrast with her CT scan and therefore patient was started on normal saline 200 mL/h 4 hours. Patient is to be discharged after completion of her IV fluids. Patient's creatinine around 1.73 at the time of discharge which is also her baseline. PCP requested to monitor kidney function upon discharge   8. Anxiety Continuing when necessary benzodiazepine.  9. Esophageal reflux Continuing Pepcid.  10. 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  11. Evidence of protein  calorie malnutrition. Patient may benefit from evaluation with nutritionist.  12. Physical deconditioning. Patient will benefit from physical therapy evaluation as well.   Discharge Exam: Blood pressure 145/70, pulse 103, temperature 98.7 F (37.1 C), temperature source Oral, resp. rate 16, height 5' 7"  (1.702 m), weight 60.4 kg (133 lb 2.5 oz), SpO2 97 %.  General:Pleasant affect, having significant head ache Skin:Warm and dry, brisk capillary refill HEENT:normocephalic, sclera clear, mucus membranes moist Neck:supple, no JVD, no bruits  Heart:S1S2 RRR without murmur, gallup, rub or click Lungs:clear without rales, rhonchi, or wheezes DPO:EUMP, non tender, + BS, do not palpate liver spleen or masses Ext:no lower ext edema, 1+ pedal pulses, 2+ radial pulses Neuro:alert and oriented, MAE X 3, follows commands, + facial symmetry        Follow-up Information    Follow up with PCP. Schedule an appointment as soon as possible for a visit in 3 days.  SignedReyne Dumas 09/18/2015, 10:08 AM        Time spent >45 mins

## 2015-09-19 NOTE — ED Provider Notes (Signed)
Arrival Date & Time: 09/16/15 & 1859 History  HPI Limitations: none. Chief Complaint  Patient presents with  . Chest Pain   HPI Charlotte [name] is a 49 y.o. female with a chief complaint of Chest Pain  patient presents for midsternal chest pain described as tightness and burning sensation has been present for 2 days. Also endorses headache that is typical N of similar quality and intensity of other headaches. Headaches was not sudden in onset and maximal intensity not in onset either. Patient has history of prior stroke with left-sided residual weakness in upper and lower extremity. EMS gave patient aspirin 324 prior to arrival. Denies presyncope diaphoresis nausea or emesis lightheadedness or new weakness at this time. States no exertional chest discomfort and denies fever chills or other symptomatic endorsements at this time. Chest pain does not radiate.   Past Medical History  I reviewed & agree with nursing's documentation on PMHx, PSHx, SHx and FHx. Past Medical History  Diagnosis Date  . Hypertension   . Family history of anesthesia complication     "alot of back pains from the epidurals"  . Anemia   . Depression   . GERD (gastroesophageal reflux disease)   . 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 Bipolar disorder (HCC)   . Anxiety   . Complication of anesthesia     "alot of back pains from the epidurals"  . Daily headache   . Migraine     "sometimes twice/day" (01/16/2015)  . Stroke (HCC) 06/29/2014    residual BLE weakness  . Arthritis     "shoulders; hands" (01/16/2015)  . Chronic lower back pain   . Polycystic kidney disease     /notes 07/05/2014  . Renal insufficiency    Past Surgical History  Procedure Laterality Date  . Cesarean section  1986  . Tubal ligation  1986  . Spinal drain placement  06/29/2014  . Spinal drain  removal  07/02/2014  . Thoracentesis  07/05/2014    [name]/notes 07/05/2014  . Thoracic aortic endovascular stent graft N/A 08/23/2014    Procedure: ENDOVASCULAR THORACIC AORTIC  STENT GRAFT;  Surgeon: Sherren Kernsharles E Fields, MD;  Location: [facility] OR;  Service: Vascular;  Laterality: N/A;   Social History   Social History  . Marital Status: Single    Spouse Name: N/A  . Number of Children: N/A  . Years of Education: N/A   Social History Main Topics  . Smoking status: Current Every Day Smoker -- 1.50 packs/day for 37 years    Types: Cigarettes  . Smokeless tobacco: Never Used  . Alcohol Use: 0.0 oz/week    0 Standard drinks or equivalent per week     Comment: "recovering alcoholic since 2013"  . Drug Use: Yes    Special: "Crack" cocaine     Comment:  "recovering cocaine addict since 2013"  . Sexual Activity: Not Asked   Other Topics Concern  . None   Social History Narrative   Family History  Problem Relation Age of Onset  . Stroke Father   . Hypertension Father   . Diabetes type II Other   . Hypertension Mother   . Hypertension Sister   . Diabetes Paternal Grandfather     Review of Systems  Complete ROS obtained and pertinent positive and negatives documented above in HPI. All other ROS  negative.  Allergies  Seroquel and Olanzapine  Home Medications   Prior to Admission medications   Medication Sig Start Date End Date Taking? Authorizing Provider  ALPRAZolam (XANAX) 0.25 MG tablet Take 1 tablet (0.25 mg total) by mouth 3 (three) times daily as needed for anxiety. Patient not taking: Reported on 09/16/2015 10/16/14   Doris Cheadle, MD  aspirin EC 81 MG tablet Take 1 tablet (81 mg total) by mouth daily. 09/18/15   Richarda Overlie, MD  atorvastatin (LIPITOR) 20 MG tablet Take 1 tablet (20 mg total) by mouth daily at 6 PM. Patient not taking: Reported on 09/16/2015 10/16/14   Doris Cheadle, MD  Diapers & Supplies MISC Use as directed Patient not taking: Reported on 09/16/2015 10/16/14    Doris Cheadle, MD  famotidine (PEPCID) 20 MG tablet Take 1 tablet (20 mg total) by mouth 2 (two) times daily. Patient not taking: Reported on 09/16/2015 10/16/14   Doris Cheadle, MD  feeding supplement, ENSURE COMPLETE, (ENSURE COMPLETE) LIQD Take 237 mLs by mouth 3 (three) times daily between meals. Patient not taking: Reported on 09/16/2015 07/08/14   Christiane Ha, MD  gabapentin (NEURONTIN) 100 MG capsule Take 2 capsules (200 mg total) by mouth 3 (three) times daily. Patient not taking: Reported on 09/16/2015 08/26/14   Charlotte Clack, PA-C  hydrALAZINE (APRESOLINE) 100 MG tablet Take 1 tablet (100 mg total) by mouth 2 (two) times daily. 09/18/15   Richarda Overlie, MD  isosorbide mononitrate (IMDUR) 30 MG 24 hr tablet Take 1 tablet (30 mg total) by mouth daily. 09/18/15   Richarda Overlie, MD  labetalol (NORMODYNE) 300 MG tablet Take 1 tablet (300 mg total) by mouth 2 (two) times daily. 09/18/15   Richarda Overlie, MD  methocarbamol (ROBAXIN) 500 MG tablet Take 1 tablet (500 mg total) by mouth every 6 (six) hours as needed for muscle spasms. Patient not taking: Reported on 09/16/2015 10/16/14   Doris Cheadle, MD  nicotine (NICODERM CQ - DOSED IN MG/24 HOURS) 14 mg/24hr patch Place 1 patch (14 mg total) onto the skin daily. 09/18/15   Richarda Overlie, MD  oxyCODONE 10 MG TABS Take 1 tablet (10 mg total) by mouth daily. Patient not taking: Reported on 09/16/2015 01/20/15   Renae Fickle, MD  polyethylene glycol [facility] / Ethelene Hal) packet Take 17 g by mouth daily after supper. Patient not taking: Reported on 09/16/2015 07/25/14   Evlyn Kanner Love, PA-C  sertraline (ZOLOFT) 100 MG tablet Take 1 tablet (100 mg total) by mouth daily. Patient not taking: Reported on 09/16/2015 10/16/14   Doris Cheadle, MD  traMADol (ULTRAM) 50 MG tablet Take 1 tablet (50 mg total) by mouth every 6 (six) hours as needed for moderate pain. Patient not taking: Reported on 09/16/2015 10/16/14   Doris Cheadle, MD    Physical Exam   BP 131/72 mmHg  Pulse 70  Temp(Src) 97.9 F (36.6 C) (Oral)  Resp 19  Ht  (1.702 m)  Wt 133 lb 2.5 oz (60.4 kg)  BMI 20.85 kg/m2  SpO2 94% Physical Exam  Constitutional: [name], place, and time. She appears well-developed and well-nourished.  Non-toxic appearance. She does not appear ill. No distress.  HENT:  Head: Normocephalic and atraumatic.  Right Ear: External ear normal.  Left Ear: External ear normal.  Eyes: Pupils are equal, round, and reactive to light. No scleral icterus.  Neck: Normal range of motion. Neck supple. No tracheal deviation present.  Cardiovascular: Intact distal pulses.   Murmur  heard. BPs obtained manually and systolics and disatolics within 10 mmhg of each other  Pulmonary/Chest: Effort normal and breath sounds normal. No stridor. No respiratory distress. She has no wheezes. She has no rales.  Abdominal: Soft. Bowel sounds are normal. She exhibits no distension. There is no tenderness. There is no rebound and no guarding.  Musculoskeletal: Normal range of motion.  Neurological: She is alert and oriented to person, place, and time. She has normal strength and normal reflexes. No cranial nerve deficit or sensory deficit.  Skin: Skin is warm and dry. No pallor.  Psychiatric: She has a normal mood and affect. Her behavior is normal.    ED Course  Procedures Labs Review Labs Reviewed  BASIC METABOLIC PANEL - Abnormal; Notable for the following:    Potassium 3.0 (*)    Glucose, Bld 113 (*)    Creatinine, Ser 1.48 (*)    GFR calc non Af Amer 40 (*)    GFR calc Af Amer 46 (*)    All other components within normal limits  COMPREHENSIVE METABOLIC PANEL - Abnormal; Notable for the following:    Potassium 3.2 (*)    Glucose, Bld 119 (*)    Creatinine, Ser 1.51 (*)    Albumin 3.3 (*)    ALT 11 (*)    GFR calc non Af Amer 39 (*)    GFR calc Af Amer 45 (*)    All other components within normal limits  TROPONIN I - Abnormal; Notable  for the following:    Troponin I 0.05 (*)    All other components within normal limits  COMPREHENSIVE METABOLIC PANEL - Abnormal; Notable for the following:    Chloride 114 (*)    CO2 20 (*)    Glucose, Bld 109 (*)    BUN 21 (*)    Creatinine, Ser 1.73 (*)    Total Protein 5.9 (*)    Albumin 2.9 (*)    AST 12 (*)    ALT 9 (*)    GFR calc non Af Amer 33 (*)    GFR calc Af Amer 38 (*)    All other components within normal limits  MRSA PCR SCREENING  CBC  URINE RAPID DRUG SCREEN, HOSP PERFORMED  CBC WITH DIFFERENTIAL/PLATELET  TROPONIN I  TROPONIN I  MAGNESIUM  CBC  I-STAT TROPOININ, ED    Imaging Review No results found.  Laboratory and Imaging results were personally reviewed by myself and used in the medical decision making of this patient's treatment and disposition.  EKG Interpretation  EKG Interpretation  Date/Time:  Tuesday September 16 2015 19:02:03 EDT Ventricular Rate:  58 PR Interval:  135 QRS Duration: 80 QT Interval:  427 QTC Calculation: 419 R Axis:   54 Text Interpretation:  Sinus rhythm Probable LVH with secondary repol abnrm Anterior ST elevation, probably due to LVH `no acute change compared to ecg 01/18/15 Confirmed by Denton Lank  MD, Caryn Bee ([id number]) on 09/16/2015 7:20:37 PM      MDM  Charlotte Robert is a 49 y.o. female with H&P as above. ED clinical course as follows: Patient with PMHx remarkable for HTN and prior Thoracic Aortic Aneurysm Standford Type B with Dissection in 2015 with subsequent Thoracic aortic endovascular stent graft placed.   Patient presents with chest pain and initial ECG with LVH and early repolarization without STEMI or STEMI equivalent and delta troponins are negative.   Obtained CTA Chest ABD PELVIS which was negative for acute cardiopulmonary disease or dissection. No PTX  or evidence of PNA.   Patient required IV and PO lebetalol along with with IV hydralazine for elevated BPs.  I obtained consultation with the Hospitalist  service for concerns of high risk chest pain. I discussed the patients clinical course including their H&P, as well as, their diagnostic studies. Based upon that discussion, we've decided that the patient will require admission for treatment of Hypertensive episode with Hx of dissection.  Clinical Impression:  1. Other chest pain   2. Chest pain   3. Dissection of aorta (HCC)   4. Essential hypertension    Disposition:  Admit  Patient care discussed with Dr. Denton Lank, who oversaw their evaluation & treatment & voiced agreement. House Officer: Jonette Eva, MD, Emergency Medicine.   Jonette Eva, MD 09/20/15 0111  Cathren Laine, MD 09/22/15 [phone]

## 2015-09-25 NOTE — Progress Notes (Signed)
PT Late G code Entry    09/18/15 1151  PT G-Codes **NOT FOR INPATIENT CLASS**  Functional Assessment Tool Used clinical judgement  Functional Limitation Mobility: Walking and moving around  Mobility: Walking and Moving Around Current Status (Z6109(G8978) CI  Mobility: Walking and Moving Around Goal Status [id number](G8979) CI  Mobility: Walking and Moving Around Discharge Status [phone](G8980) CI  [facility] Kluge PT [phone]

## 2015-10-03 ENCOUNTER — Encounter: Payer: Self-pay | Admitting: Cardiology

## 2015-10-03 ENCOUNTER — Ambulatory Visit (INDEPENDENT_AMBULATORY_CARE_PROVIDER_SITE_OTHER): Payer: Medicaid Other | Admitting: Cardiology

## 2015-10-03 VITALS — BP 160/108 | HR 66 | Ht 66.0 in

## 2015-10-03 DIAGNOSIS — I10 Essential (primary) hypertension: Secondary | ICD-10-CM | POA: Diagnosis not present

## 2015-10-03 DIAGNOSIS — I7101 Dissection of thoracic aorta: Secondary | ICD-10-CM

## 2015-10-03 DIAGNOSIS — G8220 Paraplegia, unspecified: Secondary | ICD-10-CM | POA: Diagnosis not present

## 2015-10-03 DIAGNOSIS — G9511 Acute infarction of spinal cord (embolic) (nonembolic): Secondary | ICD-10-CM | POA: Diagnosis not present

## 2015-10-03 DIAGNOSIS — I71012 Dissection of descending thoracic aorta: Secondary | ICD-10-CM

## 2015-10-03 MED ORDER — HYDRALAZINE HCL 100 MG PO TABS: 100.0000 mg | ORAL_TABLET | Freq: Two times a day (BID) | ORAL | Status: DC

## 2015-10-03 NOTE — Progress Notes (Signed)
Cardiology Office Note   Date:  10/03/2015   ID:  Charlotte [name], DOB 19-Apr-1966, MRN [id number]  PCP:  Doris Cheadle, MD  Cardiologist: Cassell Clement MD  Chief Complaint  Patient presents with  . Chest Pain      History of Present Illness: Charlotte [name] is a 49 y.o. female who presents for follow-up office visit  .[name] She was admitted in August 2015 with a dissected aortic aneurysm of the thoracic aorta. It was a type B aortic dissection. 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 She was discharged from, hospital on 07/25/14. 4 days later she went to [place] long or chest pain. She ruled out for myocardial infarction. She was discharged on 07/31/14.[name] She went back to come hospital on 08/02/14 to the emergency room and was evaluated and released. In October she apparently had another hospital admission secondary to tachycardia and anxiety..  She has not had syncope. She is a former smoker. She stopped smoking when she had her aortic dissection and her stroke last month. She has had some atypical left-sided chest discomfort under the left lateral breast. No precordial chest discomfort is noted. She is unable to stand unassisted. She travels by wheelchair.  She has had difficulty maintaining her medical regimen because of the expense of the medicines.  She has run out of most of her medicines now and her blood pressure is high  Past Medical History  Diagnosis Date  . Hypertension   . Family history of anesthesia complication     "alot of back pains from the epidurals"  . Anemia   . Depression   . GERD (gastroesophageal reflux disease)   . 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 Bipolar disorder (HCC)   . Anxiety   . Complication of anesthesia    "alot of back pains from the epidurals"  . Daily headache   . Migraine     "sometimes twice/day" (01/16/2015)  . Stroke (HCC) 06/29/2014    residual BLE weakness  . Arthritis     "shoulders; hands" (01/16/2015)  . Chronic lower back pain   . Polycystic kidney disease     /notes 07/05/2014  . Renal insufficiency     Past Surgical History  Procedure Laterality Date  . Cesarean section  1986  . Tubal ligation  1986  . Spinal drain placement  06/29/2014  . Spinal drain removal  07/02/2014  . Thoracentesis  07/05/2014    [name] 07/05/2014  . Thoracic aortic endovascular stent graft N/A 08/23/2014    Procedure: ENDOVASCULAR THORACIC AORTIC  STENT GRAFT;  Surgeon: Sherren Kerns, MD;  Location: [facility] OR;  Service: Vascular;  Laterality: N/A;     Current Outpatient Prescriptions  Medication Sig Dispense Refill  . aspirin EC 81 MG tablet Take 1 tablet (81 mg total) by mouth daily. 60 tablet 1  . feeding supplement, ENSURE COMPLETE, (ENSURE COMPLETE) LIQD Take 237 mLs by mouth 3 (three) times daily between meals.    . hydrALAZINE (APRESOLINE) 100 MG tablet Take 1 tablet (100 mg total) by mouth 2 (two) times daily. 60 tablet 5  . isosorbide mononitrate (IMDUR) 30 MG 24 hr tablet Take 1 tablet (30 mg total) by mouth daily. 30 tablet 1  . labetalol (NORMODYNE) 300  MG tablet Take 1 tablet (300 mg total) by mouth 2 (two) times daily. 60 tablet 2  . nicotine (NICODERM CQ - DOSED IN MG/24 HOURS) 14 mg/24hr patch Place 1 patch (14 mg total) onto the skin daily. 28 patch 0   No current facility-administered medications for this visit.    Allergies:   Seroquel and Olanzapine    Social History:  The patient  reports that she has been smoking Cigarettes.  She has a 55.5 pack-year smoking history. She has never used smokeless tobacco. She reports that she drinks alcohol. She reports that she uses illicit drugs ("Crack" cocaine).   Family History:  The patient's family history includes Diabetes in her  paternal grandfather; Diabetes type II in her other; Hypertension in her father, mother, and sister; Stroke in her father.    ROS:  Please see the history of present illness.   Otherwise, review of systems are positive for none.   All other systems are reviewed and negative.    PHYSICAL EXAM: VS:  BP 160/108 mmHg  Pulse 66  Ht 5\' 6"  (1.676 m)  Wt  , BMI There is no weight on file to calculate BMI. GEN: Well nourished, well developed, in no acute distress HEENT: normal Neck: no JVD, carotid bruits, or masses Cardiac: RRR; no murmurs, rubs, or gallops,no edema  Respiratory:  clear to auscultation bilaterally, normal work of breathing GI: soft, nontender, nondistended, + BS MS: no deformity or atrophy Skin: warm and dry, no rash Neuro:  Decreased strength in lower extremities Psych: euthymic mood, full affect   EKG:  EKG is ordered today. The ekg ordered today demonstrates normal sinus rhythm at 66 bpm.  LVH with strain improved since 09/16/15   Recent Labs: 01/16/2015: B Natriuretic Peptide 69.4 01/18/2015: TSH 0.484 09/17/2015: Magnesium 2.0 09/18/2015: ALT 9*; BUN 21*; Creatinine, Ser 1.73*; Hemoglobin 12.3; Platelets 232; Potassium 4.5; Sodium 141    Lipid Panel    Component Value Date/Time   CHOL 201* 06/30/2014 0255   TRIG 96 06/30/2014 0255   HDL 53 06/30/2014 0255   CHOLHDL 3.8 06/30/2014 0255   VLDL 19 06/30/2014 0255   LDLCALC 129* 06/30/2014 0255      Wt Readings from Last 3 Encounters:  09/18/15 133 lb 2.5 oz (60.4 kg)  01/20/15 137 lb 5.6 oz (62.3 kg)  11/07/14 126 lb (57.153 kg)       ASSESSMENT AND PLAN:  1. Status post type B. aortic dissection with resultant paraparesis of the lower extremities secondary to spinal cord ischemia. 2. hypertensive cardiovascular disease 3. bipolar disorder 4. chronic renal insufficiency Plan: Her systolic blood pressure is still elevated.This is due to running out of a lot of her medications.  We will refill her  hydralazine 100 mg twice a day.  She has been having headaches from indoor 30 mg daily and we will recommend that she try cutting it in half and taking just 15 mg daily.  Recheck for cardiology follow-up visit in about 6 months    Current medicines are reviewed at length with the patient today.  The patient does not have concerns regarding medicines.  The following changes have been made:  no change  Labs/ tests ordered today include:   Orders Placed This Encounter  Procedures  . EKG 12-Lead      Signed, Cassell Clementhomas Lyndel Sarate MD 10/03/2015 6:06 PM    [facility] [address], [place], [place]  [address] Phone: [phone]; Fax: [phone]

## 2015-10-03 NOTE — Patient Instructions (Signed)
Medication Instructions:  Your physician recommends that you continue on your current medications as directed. Please refer to the Current Medication list given to you today.  Labwork: none  Testing/Procedures: none  Follow-Up Your physician wants you to follow-up in: 6 months with Dr Lyn HollingsheadVaranasi  You will receive a reminder letter in the mail two months in advance. If you don't receive a letter, please call our office to schedule the follow-up appointment.  If you need a refill on your cardiac medications before your next appointment, please call your pharmacy.

## 2015-11-05 ENCOUNTER — Other Ambulatory Visit: Payer: Self-pay

## 2015-11-05 DIAGNOSIS — Z1231 Encounter for screening mammogram for malignant neoplasm of breast: Secondary | ICD-10-CM

## 2015-11-07 ENCOUNTER — Other Ambulatory Visit: Payer: Self-pay | Admitting: Internal Medicine

## 2015-11-07 DIAGNOSIS — E2839 Other primary ovarian failure: Secondary | ICD-10-CM

## 2015-11-26 ENCOUNTER — Inpatient Hospital Stay: Admission: RE | Admit: 2015-11-26 | Payer: Medicaid Other | Source: Ambulatory Visit

## 2015-11-27 ENCOUNTER — Ambulatory Visit: Payer: Medicaid Other

## 2016-02-12 ENCOUNTER — Encounter (HOSPITAL_COMMUNITY): Payer: Self-pay | Admitting: Cardiology

## 2016-02-12 ENCOUNTER — Emergency Department (HOSPITAL_COMMUNITY): Payer: Medicaid Other

## 2016-02-12 ENCOUNTER — Emergency Department (HOSPITAL_COMMUNITY)
Admission: EM | Admit: 2016-02-12 | Discharge: 2016-02-12 | Disposition: A | Discharge status: Home / Self Care | Payer: Medicaid Other | Attending: Emergency Medicine | Admitting: Emergency Medicine

## 2016-02-12 DIAGNOSIS — Z7982 Long term (current) use of aspirin: Secondary | ICD-10-CM | POA: Insufficient documentation

## 2016-02-12 DIAGNOSIS — F1410 Cocaine abuse, uncomplicated: Secondary | ICD-10-CM | POA: Insufficient documentation

## 2016-02-12 DIAGNOSIS — R079 Chest pain, unspecified: Secondary | ICD-10-CM | POA: Diagnosis not present

## 2016-02-12 DIAGNOSIS — I158 Other secondary hypertension: Secondary | ICD-10-CM

## 2016-02-12 DIAGNOSIS — I10 Essential (primary) hypertension: Secondary | ICD-10-CM | POA: Insufficient documentation

## 2016-02-12 DIAGNOSIS — G8929 Other chronic pain: Secondary | ICD-10-CM | POA: Diagnosis not present

## 2016-02-12 DIAGNOSIS — Z79899 Other long term (current) drug therapy: Secondary | ICD-10-CM | POA: Diagnosis not present

## 2016-02-12 DIAGNOSIS — F319 Bipolar disorder, unspecified: Secondary | ICD-10-CM | POA: Diagnosis not present

## 2016-02-12 DIAGNOSIS — F17210 Nicotine dependence, cigarettes, uncomplicated: Secondary | ICD-10-CM | POA: Diagnosis not present

## 2016-02-12 DIAGNOSIS — Z8673 Personal history of transient ischemic attack (TIA), and cerebral infarction without residual deficits: Secondary | ICD-10-CM | POA: Insufficient documentation

## 2016-02-12 DIAGNOSIS — N289 Disorder of kidney and ureter, unspecified: Secondary | ICD-10-CM | POA: Insufficient documentation

## 2016-02-12 LAB — BASIC METABOLIC PANEL
ANION GAP: 9 (ref 5–15)
BUN: 17 mg/dL (ref 6–20)
CALCIUM: 9.3 mg/dL (ref 8.9–10.3)
CHLORIDE: 109 mmol/L (ref 101–111)
CO2: 24 mmol/L (ref 22–32)
Creatinine, Ser: 1.73 mg/dL — ABNORMAL HIGH (ref 0.44–1.00)
GFR calc non Af Amer: 33 mL/min — ABNORMAL LOW (ref >60)
GFR, EST AFRICAN AMERICAN: 38 mL/min — AB (ref >60)
Glucose, Bld: 89 mg/dL (ref 65–99)
POTASSIUM: 3.5 mmol/L (ref 3.5–5.1)
Sodium: 142 mmol/L (ref 135–145)

## 2016-02-12 LAB — CBC
HEMATOCRIT: 39.5 % (ref 36.0–46.0)
HEMOGLOBIN: 12.7 g/dL (ref 12.0–15.0)
MCH: 28.6 pg (ref 26.0–34.0)
MCHC: 32.2 g/dL (ref 30.0–36.0)
MCV: 89 fL (ref 78.0–100.0)
PLATELETS: 265 10*3/uL (ref 150–400)
RBC: 4.44 MIL/uL (ref 3.87–5.11)
RDW: 13.6 % (ref 11.5–15.5)
WBC: 5.7 10*3/uL (ref 4.0–10.5)

## 2016-02-12 LAB — ETHANOL: Alcohol, Ethyl (B): <5 mg/dL (ref <5)

## 2016-02-12 LAB — D-DIMER, QUANTITATIVE: D-Dimer, Quant: 2.06 ug/mL-FEU — ABNORMAL HIGH (ref 0.00–0.50)

## 2016-02-12 LAB — I-STAT TROPONIN, ED
TROPONIN I, POC: 0.01 ng/mL (ref 0.00–0.08)
Troponin i, poc: 0 ng/mL (ref 0.00–0.08)

## 2016-02-12 MED ORDER — OXYCODONE-ACETAMINOPHEN 5-325 MG PO TABS
2.0000 | ORAL_TABLET | Freq: Once | ORAL | Status: AC
  Administered 2016-02-12: 2 via ORAL
  Filled 2016-02-12: qty 2

## 2016-02-12 MED ORDER — HYDRALAZINE HCL 20 MG/ML IJ SOLN
10.0000 mg | Freq: Once | INTRAMUSCULAR | Status: AC
  Administered 2016-02-12: 10 mg via INTRAVENOUS
  Filled 2016-02-12: qty 1

## 2016-02-12 MED ORDER — HYDRALAZINE HCL 25 MG PO TABS
100.0000 mg | ORAL_TABLET | Freq: Once | ORAL | Status: AC
  Administered 2016-02-12: 100 mg via ORAL
  Filled 2016-02-12: qty 4

## 2016-02-12 MED ORDER — SODIUM CHLORIDE 0.9 % IV BOLUS (SEPSIS)
500.0000 mL | Freq: Once | INTRAVENOUS | Status: AC
  Administered 2016-02-12: 500 mL via INTRAVENOUS

## 2016-02-12 MED ORDER — LABETALOL HCL 200 MG PO TABS
300.0000 mg | ORAL_TABLET | Freq: Once | ORAL | Status: AC
  Administered 2016-02-12: 300 mg via ORAL
  Filled 2016-02-12: qty 2

## 2016-02-12 MED ORDER — IOHEXOL 350 MG/ML SOLN
75.0000 mL | Freq: Once | INTRAVENOUS | Status: AC | PRN
  Administered 2016-02-12: 75 mL via INTRAVENOUS

## 2016-02-12 NOTE — ED Notes (Signed)
Called hannah, pa-c, as patient isnt ready to leave, bp 176/94. She will order home meds and patient will be discharged.

## 2016-02-12 NOTE — ED Provider Notes (Signed)
CSN: [id number]     Arrival date & time 02/12/16  1339 History   First MD Initiated Contact with Patient 02/12/16 1507     Chief Complaint  Patient presents with  . Chest Pain     (Consider location/radiation/quality/duration/timing/severity/associated sxs/prior Treatment) The history is provided by the patient and medical records. No language interpreter was used.     Charlotte [name] is a 50 y.o. female  with a hx of HTN, anemia, depression, GERD, thoracic aorta dissection, chronic lower back pain, polycystic kidney disease, CVA (residucal BLE weakness; wheelchair bound) presents to the Emergency Department complaining of waxing and waning left sided chest pain onset "over the weekend" while she was visiting friends.  She reports taking her GERD medications without relief.  She denies fever, cough, diaphoresis, abd pain, N/V/D, syncope, SOB, dysuria, leg swelling.  Pt reports she has been smoking crack and drinking this weekend with last use yesterday.  She states she is smoking 1 ppd of cigarettes since she was age 89.   She reports that when her home health RN checked her BP at home today she was concerned.  EMS was contacted and pt was given 324mg  of ASA.  Pt reports taking her BP medications at home as directed.  She reports pain is worse with movement and deep inspiration, but nothing makes the pain go away.  Pt reports this CP is different from her previous dissection.    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 It caused her to have lower extremity paresthesias and weakness secondary to spinal cord ischemia caused by the aortic dissection. She was discharged from the hospital on 07/25/14.    Past Medical History  Diagnosis Date  . Hypertension   . Family history of anesthesia complication     "alot of back pains from the epidurals"  . Anemia   . Depression   . GERD (gastroesophageal reflux  disease)   . 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 Bipolar disorder (HCC)   . Anxiety   . Complication of anesthesia     "alot of back pains from the epidurals"  . Daily headache   . Migraine     "sometimes twice/day" (01/16/2015)  . Stroke (HCC) 06/29/2014    residual BLE weakness  . Arthritis     "shoulders; hands" (01/16/2015)  . Chronic lower back pain   . Polycystic kidney disease     /notes 07/05/2014  . Renal insufficiency    Past Surgical History  Procedure Laterality Date  . Cesarean section  1986  . Tubal ligation  1986  . Spinal drain placement  06/29/2014  . Spinal drain removal  07/02/2014  . Thoracentesis  07/05/2014    [name]/notes 07/05/2014  . Thoracic aortic endovascular stent graft N/A 08/23/2014    Procedure: ENDOVASCULAR THORACIC AORTIC  STENT GRAFT;  Surgeon: Sherren Kernsharles E Fields, MD;  [facility];  Service: Vascular;  Laterality: N/A;   Family History  Problem Relation Age of Onset  . Stroke Father   . Hypertension Father   . Diabetes type II Other   . Hypertension Mother   . Hypertension Sister   . Diabetes Paternal Grandfather    Social History  Substance Use Topics  . Smoking  status: Current Every Day Smoker -- 1.50 packs/day for 37 years    Types: Cigarettes  . Smokeless tobacco: Never Used  . Alcohol Use: 0.0 oz/week    0 Standard drinks or equivalent per week     Comment: "recovering alcoholic since 2013"   OB History    No data available     Review of Systems  Constitutional: Negative for fever, diaphoresis, appetite change, fatigue and unexpected weight change.  HENT: Negative for mouth sores.   Eyes: Negative for visual disturbance.  Respiratory: Negative for cough, chest tightness, shortness of breath and wheezing.   Cardiovascular: Positive for chest pain. Negative for leg swelling.   Gastrointestinal: Negative for nausea, vomiting, abdominal pain, diarrhea and constipation.  Endocrine: Negative for polydipsia, polyphagia and polyuria.  Genitourinary: Negative for dysuria, urgency, frequency and hematuria.  Musculoskeletal: Negative for back pain and neck stiffness.  Skin: Negative for rash.  Allergic/Immunologic: Negative for immunocompromised state.  Neurological: Negative for syncope, light-headedness and headaches.  Hematological: Does not bruise/bleed easily.  Psychiatric/Behavioral: Negative for sleep disturbance. The patient is not nervous/anxious.       Allergies  Seroquel and Olanzapine  Home Medications   Prior to Admission medications   Medication Sig Start Date End Date Taking? Authorizing Provider  aspirin EC 81 MG tablet Take 1 tablet (81 mg total) by mouth daily. 09/18/15  Yes Richarda Overlie, MD  feeding supplement, ENSURE COMPLETE, (ENSURE COMPLETE) LIQD Take 237 mLs by mouth 3 (three) times daily between meals. 07/08/14  Yes Christiane Ha, MD  hydrALAZINE (APRESOLINE) 100 MG tablet Take 1 tablet (100 mg total) by mouth 2 (two) times daily. 10/03/15  Yes Cassell Clement, MD  isosorbide mononitrate (IMDUR) 30 MG 24 hr tablet Take 1 tablet (30 mg total) by mouth daily. 09/18/15  Yes Richarda Overlie, MD  labetalol (NORMODYNE) 300 MG tablet Take 1 tablet (300 mg total) by mouth 2 (two) times daily. 09/18/15  Yes Richarda Overlie, MD  nicotine (NICODERM CQ - DOSED IN MG/24 HOURS) 14 mg/24hr patch Place 1 patch (14 mg total) onto the skin daily. Patient not taking: Reported on 02/12/2016 09/18/15   Richarda Overlie, MD   BP 197/99 mmHg  Pulse 54  Temp(Src) 98.1 F (36.7 C) (Oral)  Resp 12  Wt 60.328 kg  SpO2 99% Physical Exam  Constitutional: She appears well-developed and well-nourished. No distress.  Awake, alert, nontoxic appearance  HENT:  Head: Normocephalic and atraumatic.  Mouth/Throat: Oropharynx is clear and moist. No oropharyngeal exudate.   Eyes: Conjunctivae are normal. No scleral icterus.  Neck: Normal range of motion. Neck supple.  Cardiovascular: Normal rate, regular rhythm and intact distal pulses.   Pulses:      Radial pulses are 2+ on the right side, and 2+ on the left side.       Dorsalis pedis pulses are 2+ on the right side, and 2+ on the left side.  Capillary refill < 3 sec  Pulmonary/Chest: Effort normal. No accessory muscle usage. No tachypnea. No respiratory distress. She has decreased breath sounds (throughout). She has no wheezes. She has no rhonchi. She has no rales. She exhibits no bony tenderness.  Equal chest expansion Diminished but clear breath sounds  Abdominal: Soft. Bowel sounds are normal. She exhibits no mass. There is no tenderness. There is no rebound and no guarding.  Musculoskeletal: Normal range of motion. She exhibits no edema.  No peripheral edema, calf tenderness or palpable cord in the BLE  Neurological: She is alert.  Speech is clear and goal oriented Moves extremities without ataxia  Skin: Skin is warm and dry. She is not diaphoretic.  Psychiatric: She has a normal mood and affect.  Nursing note and vitals reviewed.   ED Course  Procedures (including critical care time) Labs Review Labs Reviewed  BASIC METABOLIC PANEL - Abnormal; Notable for the following:    Creatinine, Ser 1.73 (*)    GFR calc non Af Amer 33 (*)    GFR calc Af Amer 38 (*)    All other components within normal limits  D-DIMER, QUANTITATIVE (NOT AT [facility]) - Abnormal; Notable for the following:    D-Dimer, Quant 2.06 (*)    All other components within normal limits  CBC  ETHANOL  URINE RAPID DRUG SCREEN, HOSP PERFORMED  I-STAT TROPOININ, ED  I-STAT TROPOININ, ED    Imaging Review Dg Chest 2 View  02/12/2016  CLINICAL DATA:  Mid chest pain with shortness of breath for 1 week. History of hypertension. EXAM: CHEST  2 VIEW COMPARISON:  Radiographs 09/16/2015.  CT 09/16/2015. FINDINGS: The heart size and  mediastinal contours are stable. Aortic stent graft extending from the distal arch to the diaphragmatic hiatus appears unchanged. The lungs are clear. There is no pleural effusion or pneumothorax. The visualized bones appear unremarkable. IMPRESSION: No active cardiopulmonary process. Stable appearance of thoracic aortic stent graft. Electronically Signed   By: Carey Bullocks M.D.   On: 02/12/2016 14:45   Ct Angio Chest Aorta W/cm &/or Wo/cm  02/12/2016  CLINICAL DATA:  Left-sided chest pain for 1 week. History of dissecting aneurysm the thoracic aorta. EXAM: CT ANGIOGRAPHY CHEST WITH CONTRAST TECHNIQUE: Multidetector CT imaging of the chest was performed using the standard protocol during bolus administration of intravenous contrast. Multiplanar CT image reconstructions and MIPs were obtained to evaluate the vascular anatomy. CONTRAST:  75mL OMNIPAQUE IOHEXOL 350 MG/ML SOLN COMPARISON:  09/16/2015 FINDINGS: The wall stent is present in the descending thoracic aorta. No evidence of aortic aneurysm or dissection. Stent appears patent. The great vessel origins are patent. Good opacification of the central and segmental pulmonary arteries. No evidence of significant pulmonary embolus. Normal heart size. Coronary artery calcifications. Esophagus is decompressed. No significant lymphadenopathy in the chest. Minimal bilateral pleural effusions. Atelectasis in the lung bases. Airways appear patent. No focal consolidation in the lungs. No pneumothorax. Emphysematous changes in the apices. Included portions of the upper abdominal organs demonstrate enlarged kidneys consistent with polycystic renal disease. Multiple hepatic cysts consistent with polycystic liver disease. Small hemorrhagic cysts demonstrated in the kidneys. No destructive bone lesions. Review of the MIP images confirms the above findings. IMPRESSION: Descending thoracic aortic stent graft in place. Graft appears patent. No aortic dissection. No evidence of  significant pulmonary embolus. Atelectasis and small effusions in the lung bases. Polycystic liver and kidney disease. Electronically Signed   By: Burman Nieves M.D.   On: 02/12/2016 18:28   I have personally reviewed and evaluated these images and lab results as part of my medical decision-making.   EKG Interpretation   Date/Time:  Thursday February 12 2016 13:46:21 EDT Ventricular Rate:  59 PR Interval:  120 QRS Duration: 84 QT Interval:  428 QTC Calculation: 423 R Axis:   59 Text Interpretation:  Sinus bradycardia Left ventricular hypertrophy with  repolarization abnormality Abnormal ECG Confirmed by Ranae Palms  MD, DAVID  ([id number]) on 02/12/2016 4:31:20 PM        MDM   Final diagnoses:  Chest pain, unspecified chest pain type  Essential hypertension  Renal insufficiency  Other secondary hypertension  Cocaine abuse   JAELYNN POZO presents with CP for approx 5 days in the setting of crack cocaine use.  She presents hypertensive with reports that she has been compliant with her BP medications.  Her last cardiology appointment in Nov 2016 noted that she was having trouble with compliance due to financial issues.    Initial labs are reassuring today with a negative troponin.  ECG remains unchanged.  Elevated d-dimer.  CT angiogram chest shows thoracic aortic stent graft is in place and patent without further aortic dissection and no significant pulmonary embolus. Patient's pain has improved with Percocet administration. Her blood pressure has improved with hydralazine. She continues to endorse medication usage at home as prescribed.    Chest pain is not likely of cardiac or pulmonary etiology d/t presentation, no tracheal deviation, no JVD or new murmur, RRR, breath sounds equal bilaterally, EKG without acute abnormalities, negative troponin, and negative CXR and CT scan. Pt has been advised to return to the ED if CP becomes exertional, associated with diaphoresis or nausea, radiates  to left jaw/arm, worsens or becomes concerning in any way. Patient is to follow-up with cardiology and her primary care.  This was discussed with both patient and her daughter at bedside. They state understanding and are agreeable to discharge and plan.   Case has been discussed with and seen by Dr. Ranae Palms who agrees with the above plan to discharge.      Dahlia Client Keeshia Sanderlin, PA-C 02/12/16 1904  Loren Racer, MD 02/12/16 [phone]

## 2016-02-12 NOTE — ED Notes (Signed)
Pt to department via EMS from home-pt reports she started having left sided chest pain a couple of days ago. States sh had a sten placed about a year ago. Also noted to be hypertensive. Bp-217/119 Hr-57 8/10 pain 324 ASA.

## 2016-02-12 NOTE — Discharge Instructions (Signed)
1. Medications: usual home medications 2. Treatment: rest, drink plenty of fluids,  3. Follow Up: Please followup with your primary doctor in 1-2 days for discussion of your diagnoses and further evaluation after today's visit; if you do not have a primary care doctor use the resource guide provided to find one; Please return to the ER for worsening symptoms, persistent high blood pressure, other concerns

## 2016-02-12 NOTE — ED Notes (Signed)
Called [facility]tar to transport pt home

## 2016-02-12 NOTE — ED Notes (Signed)
PA Muthersbaugh at bedside. 

## 2016-03-09 ENCOUNTER — Ambulatory Visit: Payer: Medicaid Other

## 2016-03-09 ENCOUNTER — Inpatient Hospital Stay: Admission: RE | Admit: 2016-03-09 | Payer: Medicaid Other | Source: Ambulatory Visit

## 2016-04-06 ENCOUNTER — Ambulatory Visit: Payer: Medicaid Other | Admitting: Interventional Cardiology

## 2016-04-07 ENCOUNTER — Encounter: Payer: Self-pay | Admitting: Interventional Cardiology

## 2016-05-20 ENCOUNTER — Encounter: Payer: Medicaid Other | Admitting: Interventional Cardiology

## 2016-05-26 ENCOUNTER — Encounter: Payer: Self-pay | Admitting: Interventional Cardiology

## 2016-06-01 ENCOUNTER — Encounter: Payer: Self-pay | Admitting: Internal Medicine

## 2016-07-27 ENCOUNTER — Encounter: Payer: Medicaid Other | Admitting: Interventional Cardiology

## 2016-08-31 ENCOUNTER — Emergency Department (HOSPITAL_COMMUNITY): Payer: Medicaid Other

## 2016-08-31 ENCOUNTER — Emergency Department (HOSPITAL_COMMUNITY)
Admission: EM | Admit: 2016-08-31 | Discharge: 2016-08-31 | Discharge status: Against Medical Advice | Payer: Medicaid Other | Attending: Emergency Medicine | Admitting: Emergency Medicine

## 2016-08-31 ENCOUNTER — Encounter (HOSPITAL_COMMUNITY): Payer: Self-pay | Admitting: Emergency Medicine

## 2016-08-31 DIAGNOSIS — Z8673 Personal history of transient ischemic attack (TIA), and cerebral infarction without residual deficits: Secondary | ICD-10-CM | POA: Insufficient documentation

## 2016-08-31 DIAGNOSIS — Z681 Body mass index (BMI) 19 or less, adult: Secondary | ICD-10-CM | POA: Insufficient documentation

## 2016-08-31 DIAGNOSIS — Z7982 Long term (current) use of aspirin: Secondary | ICD-10-CM | POA: Diagnosis not present

## 2016-08-31 DIAGNOSIS — I10 Essential (primary) hypertension: Secondary | ICD-10-CM

## 2016-08-31 DIAGNOSIS — Z79899 Other long term (current) drug therapy: Secondary | ICD-10-CM | POA: Diagnosis not present

## 2016-08-31 DIAGNOSIS — I129 Hypertensive chronic kidney disease with stage 1 through stage 4 chronic kidney disease, or unspecified chronic kidney disease: Secondary | ICD-10-CM | POA: Insufficient documentation

## 2016-08-31 DIAGNOSIS — F17210 Nicotine dependence, cigarettes, uncomplicated: Secondary | ICD-10-CM | POA: Insufficient documentation

## 2016-08-31 DIAGNOSIS — R636 Underweight: Secondary | ICD-10-CM | POA: Diagnosis not present

## 2016-08-31 DIAGNOSIS — N183 Chronic kidney disease, stage 3 (moderate): Secondary | ICD-10-CM | POA: Diagnosis not present

## 2016-08-31 DIAGNOSIS — R030 Elevated blood-pressure reading, without diagnosis of hypertension: Secondary | ICD-10-CM

## 2016-08-31 DIAGNOSIS — Z5181 Encounter for therapeutic drug level monitoring: Secondary | ICD-10-CM | POA: Diagnosis not present

## 2016-08-31 DIAGNOSIS — Z9119 Patient's noncompliance with other medical treatment and regimen: Secondary | ICD-10-CM

## 2016-08-31 DIAGNOSIS — Z9114 Patient's other noncompliance with medication regimen: Secondary | ICD-10-CM | POA: Diagnosis not present

## 2016-08-31 DIAGNOSIS — Z91199 Patient's noncompliance with other medical treatment and regimen due to unspecified reason: Secondary | ICD-10-CM

## 2016-08-31 LAB — COMPREHENSIVE METABOLIC PANEL
ALK PHOS: 87 U/L (ref 38–126)
ALT: 14 U/L (ref 14–54)
ANION GAP: 8 (ref 5–15)
AST: 24 U/L (ref 15–41)
Albumin: 3.7 g/dL (ref 3.5–5.0)
BILIRUBIN TOTAL: 0.7 mg/dL (ref 0.3–1.2)
BUN: 20 mg/dL (ref 6–20)
CALCIUM: 9.5 mg/dL (ref 8.9–10.3)
CO2: 26 mmol/L (ref 22–32)
CREATININE: 1.91 mg/dL — AB (ref 0.44–1.00)
Chloride: 109 mmol/L (ref 101–111)
GFR calc non Af Amer: 29 mL/min — ABNORMAL LOW (ref >60)
GFR, EST AFRICAN AMERICAN: 34 mL/min — AB (ref >60)
Glucose, Bld: 83 mg/dL (ref 65–99)
Potassium: 4 mmol/L (ref 3.5–5.1)
Sodium: 143 mmol/L (ref 135–145)
TOTAL PROTEIN: 6.8 g/dL (ref 6.5–8.1)

## 2016-08-31 LAB — CBC WITH DIFFERENTIAL/PLATELET
BASOS PCT: 0 %
Basophils Absolute: 0 10*3/uL (ref 0.0–0.1)
Eosinophils Absolute: 0.2 10*3/uL (ref 0.0–0.7)
Eosinophils Relative: 3 %
HEMATOCRIT: 42 % (ref 36.0–46.0)
HEMOGLOBIN: 13.7 g/dL (ref 12.0–15.0)
LYMPHS ABS: 2.3 10*3/uL (ref 0.7–4.0)
LYMPHS PCT: 35 %
MCH: 28.8 pg (ref 26.0–34.0)
MCHC: 32.6 g/dL (ref 30.0–36.0)
MCV: 88.2 fL (ref 78.0–100.0)
MONO ABS: 0.4 10*3/uL (ref 0.1–1.0)
MONOS PCT: 7 %
NEUTROS ABS: 3.7 10*3/uL (ref 1.7–7.7)
NEUTROS PCT: 55 %
Platelets: 194 10*3/uL (ref 150–400)
RBC: 4.76 MIL/uL (ref 3.87–5.11)
RDW: 14 % (ref 11.5–15.5)
WBC: 6.7 10*3/uL (ref 4.0–10.5)

## 2016-08-31 LAB — LIPASE, BLOOD: Lipase: 25 U/L (ref 11–51)

## 2016-08-31 LAB — I-STAT CHEM 8, ED
BUN: 29 mg/dL — AB (ref 6–20)
CALCIUM ION: 1.11 mmol/L — AB (ref 1.15–1.40)
CREATININE: 2 mg/dL — AB (ref 0.44–1.00)
Chloride: 109 mmol/L (ref 101–111)
GLUCOSE: 80 mg/dL (ref 65–99)
HCT: 43 % (ref 36.0–46.0)
HEMOGLOBIN: 14.6 g/dL (ref 12.0–15.0)
Potassium: 4.3 mmol/L (ref 3.5–5.1)
Sodium: 145 mmol/L (ref 135–145)
TCO2: 28 mmol/L (ref 0–100)

## 2016-08-31 LAB — PROTIME-INR
INR: 0.96
PROTHROMBIN TIME: 12.8 s (ref 11.4–15.2)

## 2016-08-31 LAB — I-STAT TROPONIN, ED: Troponin i, poc: 0.01 ng/mL (ref 0.00–0.08)

## 2016-08-31 MED ORDER — HYDRALAZINE HCL 20 MG/ML IJ SOLN
10.0000 mg | Freq: Once | INTRAMUSCULAR | Status: AC
  Administered 2016-08-31: 10 mg via INTRAVENOUS
  Filled 2016-08-31: qty 1

## 2016-08-31 MED ORDER — LABETALOL HCL 200 MG PO TABS: 300.0000 mg | ORAL_TABLET | Freq: Two times a day (BID) | ORAL | Status: DC

## 2016-08-31 MED ORDER — LABETALOL HCL 200 MG PO TABS
300.0000 mg | ORAL_TABLET | Freq: Once | ORAL | Status: DC
  Filled 2016-08-31: qty 2

## 2016-08-31 MED ORDER — IOPAMIDOL (ISOVUE-370) INJECTION 76%
INTRAVENOUS | Status: AC
  Administered 2016-08-31: 100 mL
  Filled 2016-08-31: qty 100

## 2016-08-31 MED ORDER — PROCHLORPERAZINE EDISYLATE 5 MG/ML IJ SOLN
10.0000 mg | Freq: Once | INTRAMUSCULAR | Status: AC
  Administered 2016-08-31: 10 mg via INTRAVENOUS
  Filled 2016-08-31: qty 2

## 2016-08-31 MED ORDER — LABETALOL HCL 300 MG PO TABS: 300.0000 mg | ORAL_TABLET | Freq: Two times a day (BID) | ORAL | 0 refills | Status: AC

## 2016-08-31 MED ORDER — HYDRALAZINE HCL 100 MG PO TABS
100.0000 mg | ORAL_TABLET | Freq: Once | ORAL | Status: DC
  Filled 2016-08-31: qty 1

## 2016-08-31 MED ORDER — AMLODIPINE BESYLATE 5 MG PO TABS
5.0000 mg | ORAL_TABLET | Freq: Once | ORAL | Status: DC
  Filled 2016-08-31: qty 1

## 2016-08-31 MED ORDER — MORPHINE SULFATE (PF) 4 MG/ML IV SOLN
4.0000 mg | Freq: Once | INTRAVENOUS | Status: AC
  Administered 2016-08-31: 4 mg via INTRAVENOUS
  Filled 2016-08-31: qty 1

## 2016-08-31 MED ORDER — AMLODIPINE BESYLATE 5 MG PO TABS: 5.0000 mg | ORAL_TABLET | Freq: Every day | ORAL | 0 refills | Status: DC

## 2016-08-31 MED ORDER — HYDRALAZINE HCL 100 MG PO TABS: 100.0000 mg | ORAL_TABLET | Freq: Two times a day (BID) | ORAL | 0 refills | Status: DC

## 2016-08-31 NOTE — ED Provider Notes (Signed)
MSE was initiated and I personally evaluated the patient and placed orders (if any) at  3:33 PM on August 31, 2016.  The patient appears stable so that the remainder of the MSE may be completed by another provider.   Subjective   Charlotte [name] is a 50 y.o. female who has chronic bilateral leg pain presents to the Emergency Department requesting to be weighed. Pt reports she needs to be weighed in order to be approved for a powered wheelchair. Pt was seen by her PCP today and was unable to have herself weighed due to her wheelchair. She is also hypertensive and is requesting her blood pressure to be checked. She has a hx of type b dissected aortic aneurysm and stroke and is wheelchair bound.  Pt has been compliant with her blood pressure medication. [name]  Daughter further states that pt has had a flat affect and has "not been acting herself today." Daughter believes something serious is going on.  Objective  Physical Exam  Nursing note and vitals reviewed. Constitutional: [name], place, and time. She appears well-developed and well-nourished. No distress.  HENT:  Head: Normocephalic and atraumatic.  Eyes: Conjunctivae normal and EOM are normal. Pupils are equal, round, and reactive to light. No scleral icterus.  Neck: Normal range of motion.  Cardiovascular: Normal rate, regular rhythm and normal heart sounds.  Exam reveals no gallop and no friction rub.   No murmur heard. Pulmonary/Chest: Effort normal and breath sounds normal. No respiratory distress.  Abdominal: Soft. Bowel sounds are normal. She exhibits no distension and no mass. There is no tenderness. There is no guarding.  Neurological: She is alert and oriented to person, place, and time.  Skin: Skin is warm and dry. She is not diaphoretic.     A/P: Pt. With pain. Hypertension. Weight is 117. Patient will need further  work up.    Arthor CaptainAbigail Les Longmore, PA-C 08/31/16 1552    Rolan BuccoMelanie Belfi, MD 08/31/16 [phone]

## 2016-08-31 NOTE — ED Notes (Signed)
Called patient multiple times for triage. Will re-attempt in a few minutes

## 2016-08-31 NOTE — ED Notes (Addendum)
Pt refused to take medications and understands the possible effects of not taking the blood pressure medications. Pt also wants to leave and signed the AMA form and understands the risks of leaving with an elevated blood pressure. PA Pisciotta aware

## 2016-08-31 NOTE — ED Notes (Signed)
Patient transported to CT 

## 2016-08-31 NOTE — ED Notes (Signed)
Pt refused discharge papers and prescriptions

## 2016-08-31 NOTE — ED Provider Notes (Signed)
  Face-to-face evaluation   History: She came to the ED today to be weighed, in order to get a "[place] wheelchair." She denies headache, chest pain, weakness or dizziness. She takes her blood pressure medications sporadically. She reports that she gets angry, because of stress. He smokes cigarettes.  Physical exam: She is hypertensive. Alert, calm anxious. Heart regular rate and rhythm, no murmur. Lungs clear anteriorly. Bilateral lower legs atrophy. Normal range of motion. Arms.  Medical screening examination/treatment/procedure(s) were conducted as a shared visit with non-physician practitioner(s) and myself.  I personally evaluated the patient during the encounter   Mancel BaleElliott Annelyse Rey, MD 08/31/16 2325

## 2016-08-31 NOTE — ED Notes (Signed)
Pt refused to let this tech

## 2016-08-31 NOTE — Discharge Instructions (Addendum)
Please take all of your blood pressure medications as prescribed. Follow with your primary care care doctor in the next 2 days for blood pressure recheck.   Do not hesitate to return to the emergency room for any new, worsening or concerning symptoms.

## 2016-08-31 NOTE — ED Notes (Signed)
Pt would like the EDP to come in and let her know when she can go home.

## 2016-08-31 NOTE — ED Provider Notes (Signed)
[facility]-EMERGENCY DEPT Provider Note   CSN: [id number] Arrival date & time: 08/31/16  1439     History   Chief Complaint Chief Complaint  Patient presents with  . Follow-up    HPI  Blood pressure (!) 254/119, pulse 71, temperature 98.1 F (36.7 C), temperature source Oral, resp. rate 18, weight 53.1 kg, SpO2 98 %.  Charlotte [name] is a 50 y.o. 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 She also has a type B dissection. Patient found to be hypertensive also reporting intrascapular pain radiating down the back patient is moved to a higher acuity pod for further workup. On my exam, patient denies any chest pain, dominant pain or back pain. She states that she's been compliant with her medication however , on further questioning it appears that she is not taking her medications regularly, is appears that her home health nurse was let go 2 weeks ago and not all of her 3 hypertensive medications are being taken regularly. She denies headache, change in vision, dysarthria, ataxia, worsening  weakness.   HPI  Past Medical History:  Diagnosis Date  . Anemia   . Anxiety   . Arthritis    "shoulders; hands" (01/16/2015)  . Bipolar disorder (HCC)   . Chronic lower back pain   . Complication of anesthesia    "alot of back pains from the epidurals"  . Daily headache   . Depression   . 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 Family history of anesthesia complication    "alot of back pains from the epidurals"  . GERD (gastroesophageal reflux disease)   . Hypertension   . Migraine    "sometimes twice/day" (01/16/2015)  . 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 Polycystic kidney disease    /notes 07/05/2014  . Renal insufficiency   . Stroke (HCC) 06/29/2014   residual BLE weakness    Patient Active Problem List   Diagnosis Date Noted  . Dissection of aorta (HCC)   . Dehydration 01/19/2015  . Acute renal failure superimposed on stage 3 chronic kidney disease (HCC) 01/18/2015  . Pain in the chest   . Chest pain 01/16/2015  . Unstable angina (HCC) 01/16/2015  . 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 01/16/2015  . H/O: CVA (cerebrovascular accident) 01/16/2015  . Renal insufficiency 10/16/2014  . Essential hypertension 10/16/2014  . Esophageal reflux 10/16/2014  . Muscle spasm 10/16/2014  . Thoracic aortic dissection (HCC) 08/22/2014  . Other secondary hypertension 08/01/2014  . Anxiety 07/29/2014  . 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 Chronic renal insufficiency, stage III (moderate) 07/19/2014  . Dyslipidemia 07/19/2014  . 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 Polycystic kidney disease 06/29/2014  . Normocytic anemia 02/09/2011  . TOBACCO ABUSE 02/09/2011  . Cocaine abuse 02/09/2011  . HTN (hypertension), malignant 02/09/2011    Past Surgical History:  Procedure Laterality Date  . CESAREAN SECTION  1986  . SPINAL DRAIN PLACEMENT  06/29/2014  . SPINAL DRAIN REMOVAL  07/02/2014  . THORACENTESIS  07/05/2014   [name] 07/05/2014  .  THORACIC AORTIC ENDOVASCULAR STENT GRAFT N/A 08/23/2014   Procedure: ENDOVASCULAR THORACIC AORTIC  STENT GRAFT;  Surgeon: Sherren Kernsharles E Fields, MD;  [facility];  Service: Vascular;  Laterality: N/A;  . TUBAL LIGATION  1986    OB History    No data available       Home Medications    Prior to Admission medications   Medication Sig Start Date End Date Taking? Authorizing Provider  aspirin EC 81 MG tablet Take 1 tablet (81 mg total) by mouth daily. 09/18/15   Richarda OverlieNayana Abrol, MD  feeding supplement,  ENSURE COMPLETE, (ENSURE COMPLETE) LIQD Take 237 mLs by mouth 3 (three) times daily between meals. 07/08/14   Christiane Haorinna L Sullivan, MD  hydrALAZINE (APRESOLINE) 100 MG tablet Take 1 tablet (100 mg total) by mouth 2 (two) times daily. 10/03/15   Cassell Clementhomas Brackbill, MD  isosorbide mononitrate (IMDUR) 30 MG 24 hr tablet Take 1 tablet (30 mg total) by mouth daily. 09/18/15   Richarda OverlieNayana Abrol, MD  labetalol (NORMODYNE) 300 MG tablet Take 1 tablet (300 mg total) by mouth 2 (two) times daily. 09/18/15   Richarda OverlieNayana Abrol, MD  nicotine (NICODERM CQ - DOSED IN MG/24 HOURS) 14 mg/24hr patch Place 1 patch (14 mg total) onto the skin daily. Patient not taking: Reported on 02/12/2016 09/18/15   Richarda OverlieNayana Abrol, MD    Family History Family History  Problem Relation Age of Onset  . Stroke Father   . Hypertension Father   . Hypertension Mother   . Diabetes type II Other   . Hypertension Sister   . Diabetes Paternal Grandfather     Social History Social History  Substance Use Topics  . Smoking status: Current Every Day Smoker    Packs/day: 1.50    Years: 37.00    Types: Cigarettes  . Smokeless tobacco: Never Used  . Alcohol use 0.0 oz/week     Comment: "recovering alcoholic since 2013"     Allergies   Seroquel [quetiapine] and Olanzapine   Review of Systems Review of Systems  10 systems reviewed and found to be negative, except as noted in the HPI.  Physical Exam Updated Vital Signs BP (!) 219/111   Pulse 63   Temp 98.2 F (36.8 C) (Oral)   Resp 16   Wt 53.1 kg   SpO2 100%   BMI 18.88 kg/m   Physical Exam  Constitutional: [name], place, and time. She appears well-developed and well-nourished. No distress.  HENT:  Head: Normocephalic and atraumatic.  Mouth/Throat: Oropharynx is clear and moist.  Eyes: Conjunctivae and EOM are normal. Pupils are equal, round, and reactive to light.  Neck: Normal range of motion.  Cardiovascular: Normal rate, regular rhythm and intact distal  pulses.  Exam reveals no gallop and no friction rub.   No murmur heard. Radial pulses equal bilaterally. Radial pulses 2+ and equal bilaterally.  Pulmonary/Chest: Effort normal and breath sounds normal. No respiratory distress. She has no wheezes. She has no rales. She exhibits no tenderness.  Abdominal: Soft. There is no tenderness.  Musculoskeletal: Normal range of motion.  Neurological: She is alert and oriented to person, place, and time.  Skin: She is not diaphoretic.  Psychiatric:  Agitated, labile mood  Nursing note and vitals reviewed.    ED Treatments / Results  Labs (all labs ordered are listed, but only abnormal results are displayed) Labs Reviewed - No data to display  EKG  EKG Interpretation None       Radiology  No results found.  Procedures Procedures (including critical care time)  Medications Ordered in ED Medications - No data to display   Initial Impression / Assessment and Plan / ED Course  I have reviewed the triage vital signs and the nursing notes.  Pertinent labs & imaging results that were available during my care of the patient were reviewed by me and considered in my medical decision making (see chart for details).  Clinical Course    Vitals:   08/31/16 1700 08/31/16 1706 08/31/16 1730 08/31/16 1800  BP: (!) 254/119 (!) 254/119 (!) 211/127 (!) 203/139  Pulse: (!) 56 71 60 68  Resp: 25 18 14 18   Temp:  98.1 F (36.7 C)    TempSrc:  Oral    SpO2: 99% 98% 100% (!) 78%  Weight:        Medications  iopamidol (ISOVUE-370) 76 % injection (not administered)  hydrALAZINE (APRESOLINE) injection 10 mg (10 mg Intravenous Given 08/31/16 1729)  prochlorperazine (COMPAZINE) injection 10 mg (10 mg Intravenous Given 08/31/16 1816)  morphine 4 MG/ML injection 4 mg (4 mg Intravenous Given 08/31/16 1816)    Charlotte [name] is 50 y.o. female presenting with  Significantly elevated blood pressure, she's been noncompliant with her blood pressure  medications. She is also reporting some intrascapular pain, given her type B dissections is very concerning. She has normal pulses. Will obtain basic blood work and dissection study.  Patient has had multiple issues with wanting to leave the ED, I have convinced her to stay multiple times however, after she has obtained her CT Angie oh of chest and abdomen she states that she will not take any more blood pressure medication and wants to leave immediately, she understands the risk of leaving with a dissection including death or permanent disability. Patient is alert, oriented 3 and is capable eating full discussion of the risks and benefits, she's not clinically intoxicated and has medical decision-making capacity.  Discussed with attending who agrees she can sign out AGAINST MEDICAL ADVICE. Unfortunately this patient is even refusing to take blood pressure medication, that she "doesn't like to take blood pressure medication." We discussed the risks of medication noncompliance and she states that she just wants to leave the emergency department.  Refill this patient's blood pressure medication however she is refusing to take the prescriptions, states she has medication at home and she just doesn't want to take it.   Final Clinical Impressions(s) / ED Diagnoses   Final diagnoses:  Essential hypertension  Low weight    New Prescriptions New Prescriptions   No medications on file     Wynetta Emery, PA-C 08/31/16 2049    Mancel Bale, MD 08/31/16 2326

## 2016-08-31 NOTE — ED Triage Notes (Signed)
Pt states "i need to be weighed to get a powered wheelchair." Pt also requesting blood pressure check. Pt denies any other complaints.

## 2016-09-03 ENCOUNTER — Encounter: Payer: Self-pay | Admitting: Interventional Cardiology

## 2016-09-03 ENCOUNTER — Ambulatory Visit (INDEPENDENT_AMBULATORY_CARE_PROVIDER_SITE_OTHER): Payer: Medicaid Other | Admitting: Interventional Cardiology

## 2016-09-03 VITALS — BP 160/100 | HR 61 | Ht 66.0 in | Wt 117.0 lb

## 2016-09-03 DIAGNOSIS — Z9889 Other specified postprocedural states: Secondary | ICD-10-CM

## 2016-09-03 DIAGNOSIS — N289 Disorder of kidney and ureter, unspecified: Secondary | ICD-10-CM | POA: Diagnosis not present

## 2016-09-03 DIAGNOSIS — I7101 Dissection of thoracic aorta: Secondary | ICD-10-CM

## 2016-09-03 DIAGNOSIS — Z8679 Personal history of other diseases of the circulatory system: Secondary | ICD-10-CM

## 2016-09-03 DIAGNOSIS — I71019 Dissection of thoracic aorta, unspecified: Secondary | ICD-10-CM

## 2016-09-03 DIAGNOSIS — I10 Essential (primary) hypertension: Secondary | ICD-10-CM | POA: Diagnosis not present

## 2016-09-03 MED ORDER — AMLODIPINE BESYLATE 10 MG PO TABS: 10.0000 mg | ORAL_TABLET | Freq: Every day | ORAL | 6 refills | Status: AC

## 2016-09-03 NOTE — Progress Notes (Signed)
Cardiology Office Note   Date:  09/03/2016   ID:  Charlotte RobertHelene J Kitt, DOB 11/18/1966, MRN [id number]  PCP:  Doris Cheadleeepak Advani, MD    No chief complaint on file. HTN   Wt Readings from Last 3 Encounters:  09/03/16 117 lb (53.1 kg)  08/31/16 117 lb (53.1 kg)  02/12/16 133 lb (60.3 kg)       History of Present Illness: Charlotte [name] is a 50 y.o. 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 It was a type B aortic dissection. 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 She was discharged from, hospital on 07/25/14. 4 days later she went to [facility] or chest pain. She ruled out for myocardial infarction. She was discharged on 07/31/14.[name]. She went back to come hospital on 08/02/14 to the emergency room and was evaluated and released. In October 2015 she apparently had another hospital admission secondary to tachycardia and anxiety..  She has not had syncope. She is a former smoker. She stopped smoking when she had her aortic dissection and her stroke.  She is unable to stand unassisted. She travels by wheelchair.  She has had difficulty maintaining her medical regimen because of the expense of the medicines.  Her BP has been high.  SHe was in the ER for this a few days ago.  She was started on a medicine that she needs to take 3 times a day.  She checks BP at home.      Past Medical History:  Diagnosis Date  . Anemia   . Anxiety   . Arthritis    "shoulders; hands" (01/16/2015)  . Bipolar disorder (HCC)   . Chronic lower back pain   . Complication of anesthesia    "alot of back pains from the epidurals"  . Daily headache   . Depression   . 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 Family history of anesthesia complication    "alot of back pains from the epidurals"  . GERD (gastroesophageal reflux disease)   . Hypertension   .  Migraine    "sometimes twice/day" (01/16/2015)  . 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 Polycystic kidney disease    /notes 07/05/2014  . Renal insufficiency   . Stroke (HCC) 06/29/2014   residual BLE weakness    Past Surgical History:  Procedure Laterality Date  . CESAREAN SECTION  1986  . SPINAL DRAIN PLACEMENT  06/29/2014  . SPINAL DRAIN REMOVAL  07/02/2014  . THORACENTESIS  07/05/2014   [name]/notes 07/05/2014  . THORACIC AORTIC ENDOVASCULAR STENT GRAFT N/A 08/23/2014   Procedure: ENDOVASCULAR THORACIC AORTIC  STENT GRAFT;  Surgeon: Sherren Kernsharles E Fields, MD;  [facility];  Service: Vascular;  Laterality: N/A;  . TUBAL LIGATION  1986     Current Outpatient Prescriptions  Medication Sig Dispense Refill  . amLODipine (NORVASC) 5 MG tablet Take 1 tablet (5 mg total) by mouth daily. 30 tablet 0  . aspirin EC 81 MG tablet Take 1 tablet (81 mg total) by mouth daily. 60 tablet 1  . atorvastatin (LIPITOR) 20 MG tablet Take 20 mg by mouth daily.    . baclofen (LIORESAL) 10 MG tablet Take 10 mg by mouth 3 (three) times daily as needed for muscle spasms.     . famotidine (PEPCID) 20 MG  tablet Take 20 mg by mouth 2 (two) times daily.    [name] gabapentin (NEURONTIN) 300 MG capsule Take 300 mg by mouth 3 (three) times daily.    . hydrALAZINE (APRESOLINE) 100 MG tablet Take 1 tablet (100 mg total) by mouth 2 (two) times daily. 60 tablet 0  . isosorbide mononitrate (IMDUR) 30 MG 24 hr tablet Take 1 tablet (30 mg total) by mouth daily. 30 tablet 1  . labetalol (NORMODYNE) 300 MG tablet Take 1 tablet (300 mg total) by mouth 2 (two) times daily. 60 tablet 0  . Multiple Vitamin (MULTIVITAMIN WITH MINERALS) TABS tablet Take 1 tablet by mouth daily.    . traMADol (ULTRAM) 50 MG tablet Take 50 mg by mouth every 6 (six) hours as needed.    . traZODone (DESYREL) 50 MG tablet Take 50 mg by mouth at bedtime.     No current facility-administered medications for this visit.      Allergies:   Seroquel [quetiapine] and Olanzapine    Social History:  The patient  reports that she has been smoking Cigarettes.  She has a 55.50 pack-year smoking history. She has never used smokeless tobacco. She reports that she drinks alcohol. She reports that she uses drugs, including "Crack" cocaine.   Family History:  The patient's family history includes Diabetes in her paternal grandfather; Diabetes type II in her other; Hypertension in her father, mother, and sister; Stroke in her father.    ROS:  Please see the history of present illness.   Otherwise, review of systems are positive for leg pain and weakness.   All other systems are reviewed and negative.    PHYSICAL EXAM: VS:  BP (!) 160/100   Pulse 61   Ht 5\' 6"  (1.676 m)   Wt 117 lb (53.1 kg)   BMI 18.88 kg/m  , BMI Body mass index is 18.88 kg/m. GEN: Well nourished, well developed, in no acute distress  HEENT: normal  Neck: no JVD, carotid bruits, or masses Cardiac: RRR; no murmurs, rubs, or gallops,no edema  Respiratory:  clear to auscultation bilaterally, normal work of breathing GI: soft, nontender, nondistended, + BS MS: no deformity or atrophy  Skin: warm and dry, no rash Neuro:  Strength and sensation are intact Psych: euthymic mood, full affect      Recent Labs: 09/17/2015: Magnesium 2.0 08/31/2016: ALT 14; BUN 29; Creatinine, Ser 2.00; Hemoglobin 14.6; Platelets 194; Potassium 4.3; Sodium 145   Lipid Panel    Component Value Date/Time   CHOL 201 (H) 06/30/2014 0255   TRIG 96 06/30/2014 0255   HDL 53 06/30/2014 0255   CHOLHDL 3.8 06/30/2014 0255   VLDL 19 06/30/2014 0255   LDLCALC 129 (H) 06/30/2014 0255     Other studies Reviewed: Additional studies/ records that were reviewed today with results demonstrating: thoracic aortic stent graft report.   ASSESSMENT AND PLAN:  1. Type B aortic dissection in 2015: Now with paraparesis of the lower extremities caused by spinal cord ischemia.  She uses a wheelchair. 2. Hypertensive cardiovascular disease:blood pressure elevated today.  She missed a dose of her meds today.  Increase amlodipine to to 10 mg daily as a once a day medicine may allow for better compliance than a 3 x/day medicine. 3. CRI:Cr. 2.0 in 10/17 4. She requests percocets for leg pain.  I asked her to go back to her PMD.     Current medicines are reviewed at length with the patient today.  The patient concerns regarding her  medicines were addressed.  The following changes have been made:  No change  Labs/ tests ordered today include:  No orders of the defined types were placed in this encounter.   Recommend 150 minutes/week of aerobic exercise Low fat, low carb, high fiber diet recommended  Disposition:   FU in 6 months    Signed, Lance Muss, MD  09/03/2016 3:16 PM    [facility] [address] [place], [place], [place]  [address] Phone: [phone]; Fax: [phone]

## 2016-09-03 NOTE — Patient Instructions (Signed)
Medication Instructions:  Same-no changes  Labwork: None  Testing/Procedures: None  Follow-Up: Your physician wants you to follow-up in: 6 months. You will receive a reminder letter in the mail two months in advance. If you don't receive a letter, please call our office to schedule the follow-up appointment.      If you need a refill on your cardiac medications before your next appointment, please call your pharmacy.   

## 2016-12-01 ENCOUNTER — Ambulatory Visit: Payer: Medicaid Other | Admitting: Physical Therapy

## 2017-01-19 ENCOUNTER — Ambulatory Visit: Payer: Medicaid Other | Attending: Internal Medicine | Admitting: Physical Therapy

## 2017-01-19 DIAGNOSIS — M6281 Muscle weakness (generalized): Secondary | ICD-10-CM | POA: Diagnosis not present

## 2017-01-19 DIAGNOSIS — R2689 Other abnormalities of gait and mobility: Secondary | ICD-10-CM | POA: Insufficient documentation

## 2017-01-19 NOTE — Therapy (Signed)
[facility] [address] [place], [place], [address] Phone: [phone]   Fax:  [phone]  Physical Therapy Evaluation  Patient Details  Name: Charlotte [name] MRN: [id number] Date of Birth: 17-Oct-1966 Referring Provider: Dr. Nolene Ebbs  Encounter Date: 01/19/2017      PT End of Session - 01/19/17 1227    Visit Number 1   Number of Visits 1   Authorization Type Medicaid   PT Start Time 1100   PT Stop Time 1210   PT Time Calculation (min) 70 min      Past Medical History:  Diagnosis Date  . Anemia   . Anxiety   . Arthritis    "shoulders; hands" (01/16/2015)  . Bipolar disorder ([place])   . Chronic lower back pain   . Complication of anesthesia    "alot of back pains from the epidurals"  . Daily headache   . Depression   . 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 Family history of anesthesia complication    "alot of back pains from the epidurals"  . GERD (gastroesophageal reflux disease)   . Hypertension   . Migraine    "sometimes twice/day" (01/16/2015)  . 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 Polycystic kidney disease    /notes 07/05/2014  . Renal insufficiency   . Stroke ([place]) 06/29/2014   residual BLE weakness    Past Surgical History:  Procedure Laterality Date  . CESAREAN SECTION  1986  . SPINAL DRAIN PLACEMENT  06/29/2014  . SPINAL DRAIN REMOVAL  07/02/2014  . THORACENTESIS  07/05/2014   Archie Endo 07/05/2014  . THORACIC AORTIC ENDOVASCULAR STENT GRAFT N/A 08/23/2014   Procedure: ENDOVASCULAR THORACIC AORTIC  STENT GRAFT;  Surgeon: Elam Dutch, MD;  Location: [facility];  Service: Vascular;  Laterality: N/A;  . [place]    There were no vitals filed for this visit.           [facility] PT Assessment - 01/19/17 1225      Assessment   Medical Diagnosis Paraparesis due to spinal CVA    Referring Provider Dr. Nolene Ebbs   Onset Date/Surgical Date 06/29/14     Precautions   Precautions Fall     Balance Screen   Has the patient fallen in the past 6 months Yes   How many times? 4   Has the patient had a decrease in activity level because of a fear of falling?  No   Is the patient reluctant to leave their home because of a fear of falling?  No          Mobility/Seating Evaluation    PATIENT INFORMATION: Name: Charlotte [name] DOB: 16-May-1966  Sex: F Date seen: 01-19-17 Time: 1100  Address:  [address] [place], [place] [address] Physician: Nolene Ebbs, MD This evaluation/justification form will serve as the LMN for the following suppliers: __________________________ Supplier: [employer] Contact Person: Casper Harrison Phone:  [phone]   Seating Therapist: Guido Sander, PT Phone:   [phone]   Phone: [phone]    Spouse/Parent/Caregiver name: Haydee Salter (Mom)  Phone number: [phone] Insurance/Payer: Medicaid     Reason for Referral: power wheelchair evaluation  Patient/Caregiver Goals: obtain power wheelchair  Patient was seen for face-to-face evaluation for  new power wheelchair.  Also present was [employer], [employer] to discuss recommendations and wheelchair options.  Further paperwork was completed and sent to vendor.  Patient appears to qualify for power mobility device at this time per objective findings.   MEDICAL HISTORY: Diagnosis: Primary Diagnosis: s/p spinal CVA with paraparesis Onset: 06-29-2014 Diagnosis: ?????   [] Progressive Disease Relevant past and future surgeries: ?????   Height: 65" Weight: 100# Explain recent changes or trends in weight: ?????   History including Falls: Pt reports having had 4 falls within past 6 months - reports almost had a fall out of wheelchair when tire came off:  pt has been nonambulatory since CVA in August 2015    HOME ENVIRONMENT: [] House  [] Condo/town home  [x] Apartment   [] Assisted Living    [x] Lives Alone []  Lives with Others                                                                                          Hours with caregiver: 3  [x] Home is accessible to patient           Stairs      [] Yes [x]  No     Ramp [] Yes [x] No Comments:  Pt has a CNA that comes 3 days/week for 3 hours/day   COMMUNITY ADL: TRANSPORTATION: [] Car    [] Van    [x] Public Transportation    [] Adapted w/c Lift    [] Ambulance    [] Other:       [x] Sits in wheelchair during transport  Employment/School: ????? Specific requirements pertaining to mobility ?????  Other: Pt is able to transfer out of wheelchair into car with assistance    FUNCTIONAL/SENSORY PROCESSING SKILLS:  Handedness:   [x] Right     [] Left    [] NA  Comments:  ?????  Functional Processing Skills for Wheeled Mobility [x] Processing Skills are adequate for safe wheelchair operation  Areas of concern than may interfere with safe operation of wheelchair Description of problem   []  Attention to environment      [] Judgment      []  Hearing  []  Vision or visual processing      [] Motor Planning  []  Fluctuations in Behavior  ?????    VERBAL COMMUNICATION: [x] WFL receptive [x]  WFL expressive [] Understandable  [] Difficult to understand  [] non-communicative []  Uses an augmented communication device  CURRENT SEATING / MOBILITY: Current Mobility Base:  [] None [] Dependent [x] Manual [] Scooter [] Power  Type of Control: ?????  Manufacturer:  ?????Size:  ?????Age: ?????  Current Condition of Mobility Base:  ?????   Current Wheelchair components:  ?????  Describe posture in present seating system:  ?????      SENSATION and SKIN ISSUES: Sensation [] Intact  [x] Impaired [] Absent  Level of sensation: ????? Pressure Relief: Able to perform effective pressure relief :    [] Yes  [x]  No Method: ???? If not, Why?: ?????  Skin Issues/Skin Integrity Current Skin Issues  [x] Yes [] No [] Intact [x]  Red area[]  Open Area  [] Scar Tissue  [x] At risk from prolonged sitting Where  ?????  History of Skin Issues  [x] Yes [] No Where  on buttocks - pt reports having dryness  When  ?????  Hx of skin flap surgeries  [] Yes [x] No Where  ????? When  ?????  Limited sitting tolerance [x] Yes [] No Hours spent sitting in wheelchair daily: approx. 2 hours only due to discomfort (pt does not have a cushion in wheelchair)  Complaint of Pain:  Please describe: pt reports pain in bil. legs - is worse on cloudy, rainy days   Swelling/Edema: occasional edema in bil. ankles   ADL STATUS (in reference to wheelchair use):  Indep Assist Unable Indep with Equip Not assessed Comments  Dressing ????? X ????? ????? ????? performs from bed  Eating X ????? ????? ????? ????? needs assistance with cutting food  Toileting ????? ????? ????? X ????? has grab bars in bathroom; wears pull ups at night  Bathing ????? X ????? ????? ????? needs assist to transfer into tub; pt reports leg on tub bench broke; uses hand held shower   Grooming/Hygiene ????? ????? ????? X ????? performs from wheelchair  Meal Prep ????? ????? X ????? ????? CNA performs  IADLS ????? X ????? X ????? uses motorized scooter in store  Bowel Management: [] Continent  [] Incontinent  [x] Accidents Comments:  ?????  Bladder Management: [] Continent  [] Incontinent  [x] Accidents Comments:  ?????     WHEELCHAIR SKILLS: Manual w/c Propulsion: [] UE or LE strength and endurance sufficient to participate in ADLs using manual wheelchair Arm : [] left [] right   [] Both      Distance: ????? Foot:  [] left [] right   [] Both  Operate Scooter: []  Strength, hand grip, balance and transfer appropriate for use [] Living environment is accessible for use of scooter  Operate Power w/c:  [x]  Std. Joystick   []  Alternative Controls Indep []  Assist []  Dependent/unable []  N/A []   [] Safe          []  Functional      Distance: ?????  Bed confined without wheelchair [x]  Yes []  No   STRENGTH/RANGE OF MOTION:  Active  Range of Motion Strength  Shoulder WNL's bil. UE's 4/5 bil. UE's  Elbow WNL's bil. UE's 5/5 bil. UE's  Wrist/Hand WNL's bil. UE's 4/5 bil. UE's  Hip Bil. hip flexion WFL's;  Bil. hip flexion contractures - Rt hip -20 degrees from neutral:  Lt hip -25 degrees from neutral 3/5 bil. hip flexors  Knee minimal active knee extension bil. LE's due to hamstring contractures:  LLE -70 degrees from complete extension: RLE -64 degrees from complete extension 2-/5 bil. quads - minimal AROM due to knee flexion contractures  Ankle minimal active dorsiflexion bil. LE's 2-/5 bil. LE's     MOBILITY/BALANCE:  []  Patient is totally dependent for mobility  ?????    Balance Transfers Ambulation  Sitting Balance: Standing Balance: []  Independent []  Independent/Modified Independent  [x]  WFL     []  WFL []  Supervision []  Supervision  []  Uses UE for balance  []  Supervision [x]  Min Assist []  Ambulates with Assist  ?????    []  Min Assist []  Min assist [x]  Mod Assist []  Ambulates with Device:      []  RW  []  StW  []  Cane  []  ?????  []  Mod Assist []  Mod assist []  Max assist   []  Max Assist []  Max assist []  Dependent []  Indep. Short Distance Only  []  Unable [x]  Unable []  Lift / Sling Required Distance (in feet)  ?????   []  Sliding board [x]  Unable to Ambulate (see explanation below)  Cardio Status:  [] Intact  [x]  Impaired   []  NA     Thoracic aortic endovascular stent graft 08-23-14  Respiratory Status:  [] Intact   [x] Impaired   [] NA     pt reports dyspnea with exertion:    Orthotics/Prosthetics: ?????  Comments (Address manual vs power w/c vs scooter): Pt is unable to safely, functionally and effectively propel a manual wheelchair due to cardiovascular system dysfunction with pt having had a thoracic aortic endovascular stent graft in 08-23-14 following spinal CVA in August 2015. Pt has spasticity resultant of CVA and requires optimal positioning.  Pt is unable to perform adequate pressure relief due to paraparesis with LE  flexion contractures.         Anterior / Posterior Obliquity Rotation-Pelvis ?????  PELVIS    [x]  []  []   Neutral Posterior Anterior  [x]  []  []   WFL Rt elev Lt elev  [x]  []  []   WFL Right Left                      Anterior    Anterior     []  Fixed []  Other []  Partly Flexible []  Flexible   []  Fixed []  Other []  Partly Flexible  [x]  Flexible  []  Fixed []  Other []  Partly Flexible  [x]  Flexible   TRUNK  [x]  []  []   WFL ? Thoracic ? Lumbar  Kyphosis Lordosis  [x]  []  []   WFL Convex Convex  Right Left [] c-curve [] s-curve [] multiple  [x]  Neutral []  Left-anterior []  Right-anterior     []  Fixed [x]  Flexible []  Partly Flexible []  Other  []  Fixed [x]  Flexible []  Partly Flexible []  Other  []  Fixed             [x]  Flexible []  Partly Flexible []  Other    Position Windswept  ?????  HIPS          [x]            []               []    Neutral       Abduct        ADduct         [x]           []            []   Neutral Right           Left      []  Fixed []  Subluxed []  Partly Flexible []  Dislocated [x]  Flexible  []  Fixed []  Other []  Partly Flexible  [x]  Flexible                 Foot Positioning Knee Positioning  ?????    [x]  WFL  [x] Lt [x] Rt [x]  WFL  [x] Lt [x] Rt    KNEES ROM concerns: ROM concerns:    & Dorsi-Flexed [] Lt [] Rt ?????    FEET Plantar Flexed [] Lt [] Rt      Inversion                 [] Lt [] Rt      Eversion                 [] Lt [] Rt     HEAD [x]  Functional [x]  Good Head Control  ?????  & []  Flexed         []  Extended []  Adequate Head Control    NECK []  Rotated  Lt  []  Lat Flexed Lt []  Rotated  Rt []  Lat Flexed Rt []  Limited Head Control     []  Cervical Hyperextension []  Absent  Head Control     SHOULDERS ELBOWS WRIST& HAND ?????  Left     Right    Left     Right    Left     Right   U/E [x] Functional           [x] Functional ????? ????? [] Fisting             [] Fisting      [] elev   [] dep      [] elev   [] dep       [] pro -[] retract     [] pro  [] retract [] subluxed              [] subluxed           Goals for Wheelchair Mobility  [x]  Independence with mobility in the home with motor related ADLs (MRADLs)  []  Independence with MRADLs in the community []  Provide dependent mobility  []  Provide recline     [] Provide tilt   Goals for Seating system [x]  Optimize pressure distribution [x]  Provide support needed to facilitate function or safety []  Provide corrective forces to assist with maintaining or improving posture []  Accommodate client's posture:   current seated postures and positions are not flexible or will not tolerate corrective forces []  Client to be independent with relieving pressure in the wheelchair [] Enhance physiological function such as breathing, swallowing, digestion  Simulation ideas/Equipment trials:????? State why other equipment was unsuccessful:?????   MOBILITY BASE RECOMMENDATIONS and JUSTIFICATION: MOBILITY COMPONENT JUSTIFICATION  Manufacturer: [employer] Model: Q6 Edge   Size: Width 16Seat Depth 18 [x] provide transport from point A to B      [x] promote Indep mobility  [x] is not a safe, functional ambulator [x] walker or cane inadequate [] non-standard width/depth necessary to accommodate anatomical measurement []  ?????  [] Manual Mobility Base [] non-functional ambulator    [] Scooter/POV  [] can safely operate  [] can safely transfer   [] has adequate trunk stability  [] cannot functionally propel manual w/c  [x] Power Mobility Base  [x] non-ambulatory  [x] cannot functionally propel manual wheelchair  [x]  cannot functionally and safely operate scooter/POV [x] can safely operate and willing to  [] Stroller Base [] infant/child  [] unable to propel manual wheelchair [] allows for growth [] non-functional ambulator [] non-functional UE [] Indep mobility is not a goal at this time  [x] Tilt  [] Forward [x] Backward [x] Powered tilt  [] Manual tilt  [x] change position against gravitational force on head and shoulders  [x] change position for  pressure relief/cannot weight shift [] transfers  [x] management of tone [x] rest periods [] control edema [x] facilitate postural control  []  ?????  [x] Recline  [x] Power recline on power base [] Manual recline on manual base  [] accommodate femur to back angle  [x] bring to full recline for ADL care  [x] change position for pressure relief/cannot weight shift [x] rest periods [x] repositioning for transfers or clothing/diaper /catheter changes [] head positioning  [] Lighter weight required [] self- propulsion  [] lifting []  ?????  [] Heavy Duty required [] user weight greater than 250# [] extreme tone/ over active movement [] broken frame on previous chair []  ?????  [x]  Back  []  Angle Adjustable []  Custom molded Sport Back [] postural control [] control of tone/spasticity [] accommodation of range of motion [] UE functional control [] accommodation for seating system []  ????? [] provide lateral trunk support [] accommodate deformity [x] provide posterior trunk support [x] provide lumbar/sacral support [x] support trunk in midline [x] Pressure relief over spinal processes  [x]  Seat Cushion Synergy Solution [x] impaired sensation  [] decubitus ulcers present [] history of pressure ulceration [x] prevent pelvic extension [x] low maintenance  [x] stabilize pelvis  [] accommodate obliquity [] accommodate multiple deformity [x] neutralize lower extremity position [x] increase pressure distribution []  ?????  []  Pelvic/thigh support  []  Lateral thigh guide []  Distal medial pad  []  Distal  lateral pad []  pelvis in neutral [] accommodate pelvis []  position upper legs []  alignment []  accommodate ROM []  decr adduction [] accommodate tone [] removable for transfers [] decr abduction  []  Lateral trunk Supports []  Lt     []  Rt [] decrease lateral trunk leaning [] control tone [] contour for increased contact [] safety  [] accommodate asymmetry []  ?????  [x]  Mounting hardware  [] lateral trunk supports  [] back    [] seat [x] headrest      []  thigh support [] fixed   [] swing away [] attach seat platform/cushion to w/c frame [] attach back cushion to w/c frame [] mount postural supports [x] mount headrest  [] swing medial thigh support away [] swing lateral supports away for transfers  []  ?????    Armrests  [] fixed [x] adjustable height [] removable   [] swing away  [x] flip back   [] reclining [x] full length pads [] desk    [] pads tubular  [x] provide support with elbow at 90   [] provide support for w/c tray [x] change of height/angles for variable activities [] remove for transfers [x] allow to come closer to table top [x] remove for access to tables []  ?????  Hangers/ Leg rests  [] 60 [] 70 [] 90 [] elevating [] heavy duty  [] articulating [] fixed [] lift off [] swing away     [] power [] provide LE support  [] accommodate to hamstring tightness [] elevate legs during recline   [] provide change in position for Legs [] Maintain placement of feet on footplate [] durability [] enable transfers [] decrease edema [] Accommodate lower leg length []  ?????  Foot support Footplate    [] Lt  []  Rt  [x]  Center mount [x] flip up     [] depth/angle adjustable [] Amputee adapter    []  Lt     []  Rt [x] provide foot support [] accommodate to ankle ROM [x] transfers [] Provide support for residual extremity []  allow foot to go under wheelchair base []  decrease tone  []  ?????  []  Ankle strap/heel loops [] support foot on foot support [] decrease extraneous movement [] provide input to heel  [] protect foot  Tires: [] pneumatic  [x] flat free inserts  [] solid  [x] decrease maintenance  [x] prevent frequent flats [] increase shock absorbency [] decrease pain from road shock [] decrease spasms from road shock []  ?????  [x]  Headrest  [] provide posterior head support [] provide posterior neck support [] provide lateral head support [] provide anterior head support [x] support during tilt and recline [] improve feeding   [] improve  respiration [] placement of switches [] safety  [] accommodate ROM  [] accommodate tone [] improve visual orientation  []  Anterior chest strap []  Vest []  Shoulder retractors  [] decrease forward movement of shoulder [] accommodation of TLSO [] decrease forward movement of trunk [] decrease shoulder elevation [] added abdominal support [] alignment [] assistance with shoulder control  []  ?????  Pelvic Positioner [x] Belt [] SubASIS bar [] Dual Pull [] stabilize tone [x] decrease falling out of chair/ **will not Decr potential for sliding due to pelvic tilting [] prevent excessive rotation [] pad for protection over boney prominence [] prominence comfort [] special pull angle to control rotation []  ?????  Upper Extremity Support [] L   []  R [] Arm trough    [] hand support []  tray       [] full tray [] swivel mount [] decrease edema      [] decrease subluxation   [] control tone   [] placement for AAC/Computer/EADL [] decrease gravitational pull on shoulders [] provide midline positioning [] provide support to increase UE function [] provide hand support in natural position [] provide work surface   POWER WHEELCHAIR CONTROLS  [x] Proportional  [] Non-Proportional Type joystick [] Left  [x] Right [x] provides access for controlling wheelchair   [] lacks motor control to operate proportional drive control [] unable to understand proportional controls  Actuator Control Module  [] Single  [x] Multiple   [x] Allow the client to operate  the power seat function(s) through the joystick control   [] Safety Reset Switches [] Used to change modes and stop the wheelchair when driving in latch mode    [x] Upgraded Electronics   [x] programming for accurate control [] progressive Disease/changing condition [] non-proportional drive control needed [x] Needed in order to operate power seat functions through joystick control   [] Display box [] Allows user to see in which mode and drive the wheelchair is set  [] necessary for alternate  controls    [] Digital interface electronics [] Allows w/c to operate when using alternative drive controls  [] ASL Head Array [] Allows client to operate wheelchair  through switches placed in tri-panel headrest  [] Sip and puff with tubing kit [] needed to operate sip and puff drive controls  [] Upgraded tracking electronics [] increase safety when driving [] correct tracking when on uneven surfaces  [x] Mount for switches or joystick [] Attaches switches to w/c  [x] Swing away for access or transfers [] midline for optimal placement [] provides for consistent access  [] Attendant controlled joystick plus mount [] safety [] long distance driving [] operation of seat functions [] compliance with transportation regulations []  ?????    Rear wheel placement/Axle adjustability [] None [] semi adjustable [] fully adjustable  [] improved UE access to wheels [] improved stability [] changing angle in space for improvement of postural stability [] 1-arm drive access [] amputee pad placement []  ?????  Wheel rims/ hand rims  [] metal  [] plastic coated [] oblique projections [] vertical projections [] Provide ability to propel manual wheelchair  []  Increase self-propulsion with hand weakness/decreased grasp  Push handles [] extended  [] angle adjustable  [] standard [] caregiver access [] caregiver assist [] allows "hooking" to enable increased ability to perform ADLs or maintain balance  One armed device  [] Lt   [] Rt [] enable propulsion of manual wheelchair with one arm   []  ?????   Brake/wheel lock extension []  Lt   []  Rt [] increase indep in applying wheel locks   [] Side guards [] prevent clothing getting caught in wheel or becoming soiled []  prevent skin tears/abrasions  Battery: 22 NF's - a pair [x] to power wheelchair ?????  Other: ????? ????? ?????  The above equipment has a life- long use expectancy. Growth and changes in medical and/or functional conditions would be the exceptions. This is to certify that the therapist has  no financial relationship with durable medical provider or manufacturer. The therapist will not receive remuneration of any kind for the equipment recommended in this evaluation.   Patient has mobility limitation that significantly impairs safe, timely participation in one or more mobility related ADL's.  (bathing, toileting, feeding, dressing, grooming, moving from room to room)                                                             [x]  Yes []  No Will mobility device sufficiently improve ability to participate and/or be aided in participation of MRADL's?         [x]  Yes []  No Can limitation be compensated for with use of a cane or walker?                                                                                []   Yes [x]  No Does patient or caregiver demonstrate ability/potential ability & willingness to safely use the mobility device?   [x]  Yes []  No Does patient's home environment support use of recommended mobility device?                                                    [x]  Yes []  No Does patient have sufficient upper extremity function necessary to functionally propel a manual wheelchair?    []  Yes []  No Does patient have sufficient strength and trunk stability to safely operate a POV (scooter)?                                  []  Yes [x]  No Does patient need additional features/benefits provided by a power wheelchair for MRADL's in the home?       [x]  Yes []  No Does the patient demonstrate the ability to safely use a power wheelchair?                                                              [x]  Yes []  No  Therapist Name Printed: Guido Sander, PT Date: 01-19-17  Therapist's Signature:   Date:   Supplier's Name Printed: [employer] Date: 01-19-17  Supplier's Signature:   Date:  Patient/Caregiver Signature:   Date:     This is to certify that I have read this evaluation and do agree with the content within:      Physician's Name Printed: Nolene Ebbs, MD   Physician's Signature:  Date:     This is to certify that I, the above signed therapist have the following affiliations: []  This DME provider []  Manufacturer of recommended equipment []  Patient's long term care facility [x]  None of the above                                 Plan - 01/19/17 1228    Clinical Impression Statement Pt presents for power wheelchair evaluation with Josh Cadle, [employer] from [place] recommended with power tilt and recline   PT Frequency One time visit   PT Treatment/Interventions Wheelchair mobility training   Consulted and Agree with Plan of Care Patient      Patient will benefit from skilled therapeutic intervention in order to improve the following deficits and impairments:  Decreased strength, Decreased mobility  Visit Diagnosis: Muscle weakness (generalized) - Plan: PT plan of care cert/re-cert  Other abnormalities of gait and mobility - Plan: PT plan of care cert/re-cert     Problem List Patient Active Problem List   Diagnosis Date Noted  . Dissection of aorta ([place])   . Dehydration 01/19/2015  . Acute renal failure superimposed on stage 3 chronic kidney disease ([place]) 01/18/2015  . Pain in the chest   . Chest pain 01/16/2015  . Unstable angina ([place]) 01/16/2015  . 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 01/16/2015  . H/O: CVA (cerebrovascular accident) 01/16/2015  . Renal insufficiency 10/16/2014  .  Essential hypertension 10/16/2014  . Esophageal reflux 10/16/2014  . Muscle spasm 10/16/2014  . Thoracic aortic dissection ([place]) 08/22/2014  . Other secondary hypertension 08/01/2014  . Anxiety 07/29/2014  . 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 Chronic renal insufficiency, stage III (moderate) 07/19/2014  . Dyslipidemia 07/19/2014  . 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 Polycystic kidney disease 06/29/2014  . Normocytic  anemia 02/09/2011  . TOBACCO ABUSE 02/09/2011  . Cocaine abuse 02/09/2011  . HTN (hypertension), malignant 02/09/2011    Alda Lea, PT 01/19/2017, 12:39 PM  [facility] [address] [place], [place], [address] Phone: [phone]   Fax:  [phone]  Name: SHLONDA DOLLOFF MRN: [id number] Date of Birth: 27-Nov-1965

## 2017-03-22 NOTE — Progress Notes (Deleted)
Cardiology Office Note   Date:  03/22/2017   ID:  Charlotte [name], DOB [birth year], MRN [id number]  PCP:  No primary care provider on file.    No chief complaint on file. HTN   Wt Readings from Last 3 Encounters:  09/03/16 117 lb (53.1 kg)  08/31/16 117 lb (53.1 kg)  02/12/16 133 lb (60.3 kg)       History of Present Illness: Charlotte [name] is a 51 y.o. 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 She was followed by Dr. Patty Sermons for a few years.  It was a type B aortic dissection. 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 She was discharged from, hospital on 07/25/14. 4 days later she went to [place] long or chest pain. She ruled out for myocardial infarction. She was discharged on 07/31/14.[name] She went back to come hospital on 08/02/14 to the emergency room and was evaluated and released. In October 2015 she apparently had another hospital admission secondary to tachycardia and anxiety..  She has not had syncope. She is a former smoker. She stopped smoking when she had her aortic dissection and her stroke.  She is unable to stand unassisted. She travels by wheelchair.  She has had difficulty maintaining her medical regimen because of the expense of the medicines.  Her BP has been high.  SHe was in the ER for this a few days ago.  She was started on a medicine that she needs to take 3 times a day.  She checks BP at home.      Past Medical History:  Diagnosis Date  . Anemia   . Anxiety   . Arthritis    "shoulders; hands" (01/16/2015)  . Bipolar disorder (HCC)   . Chronic lower back pain   . Complication of anesthesia    "alot of back pains from the epidurals"  . Daily headache   . Depression   . 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 Family history of anesthesia complication    "alot of back pains from the epidurals"  .  GERD (gastroesophageal reflux disease)   . Hypertension   . Migraine    "sometimes twice/day" (01/16/2015)  . 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 Polycystic kidney disease    /notes 07/05/2014  . Renal insufficiency   . Stroke (HCC) 06/29/2014   residual BLE weakness    Past Surgical History:  Procedure Laterality Date  . CESAREAN SECTION  1986  . SPINAL DRAIN PLACEMENT  06/29/2014  . SPINAL DRAIN REMOVAL  07/02/2014  . THORACENTESIS  07/05/2014   [name] 07/05/2014  . THORACIC AORTIC ENDOVASCULAR STENT GRAFT N/A 08/23/2014   Procedure: ENDOVASCULAR THORACIC AORTIC  STENT GRAFT;  Surgeon: Sherren Kerns, MD;  Location: [facility] OR;  Service: Vascular;  Laterality: N/A;  . TUBAL LIGATION  1986     Current Outpatient Prescriptions  Medication Sig Dispense Refill  . amLODipine (NORVASC) 10 MG tablet Take 1 tablet (10 mg total) by mouth daily. 30 tablet 6  . aspirin EC 81 MG tablet Take 1 tablet (81 mg total) by mouth daily. 60 tablet 1  . atorvastatin (LIPITOR) 20 MG tablet Take 20 mg by mouth daily.    . baclofen (LIORESAL) 10 MG tablet Take 10 mg by mouth 3 (three) times daily  as needed for muscle spasms.     . famotidine (PEPCID) 20 MG tablet Take 20 mg by mouth 2 (two) times daily.    [name] gabapentin (NEURONTIN) 300 MG capsule Take 300 mg by mouth 3 (three) times daily.    . hydrALAZINE (APRESOLINE) 100 MG tablet Take 1 tablet (100 mg total) by mouth 2 (two) times daily. 60 tablet 0  . isosorbide mononitrate (IMDUR) 30 MG 24 hr tablet Take 1 tablet (30 mg total) by mouth daily. 30 tablet 1  . labetalol (NORMODYNE) 300 MG tablet Take 1 tablet (300 mg total) by mouth 2 (two) times daily. 60 tablet 0  . Multiple Vitamin (MULTIVITAMIN WITH MINERALS) TABS tablet Take 1 tablet by mouth daily.    . traMADol (ULTRAM) 50 MG tablet Take 50 mg by mouth every 6 (six) hours as needed.    . traZODone (DESYREL) 50 MG tablet Take 50 mg by mouth at bedtime.       No current facility-administered medications for this visit.     Allergies:   Seroquel [quetiapine] and Olanzapine    Social History:  The patient  reports that she has been smoking Cigarettes.  She has a 55.50 pack-year smoking history. She has never used smokeless tobacco. She reports that she drinks alcohol. She reports that she uses drugs, including "Crack" cocaine.   Family History:  The patient's family history includes Diabetes in her paternal grandfather; Diabetes type II in her other; Hypertension in her father, mother, and sister; Stroke in her father.    ROS:  Please see the history of present illness.   Otherwise, review of systems are positive for leg pain and weakness.   All other systems are reviewed and negative.    PHYSICAL EXAM: VS:  There were no vitals taken for this visit. , BMI There is no height or weight on file to calculate BMI. GEN: Well nourished, well developed, in no acute distress  HEENT: normal  Neck: no JVD, carotid bruits, or masses Cardiac: RRR; no murmurs, rubs, or gallops,no edema  Respiratory:  clear to auscultation bilaterally, normal work of breathing GI: soft, nontender, nondistended, + BS MS: no deformity or atrophy  Skin: warm and dry, no rash Neuro:  Strength and sensation are intact Psych: euthymic mood, full affect      Recent Labs: 08/31/2016: ALT 14; BUN 29; Creatinine, Ser 2.00; Hemoglobin 14.6; Platelets 194; Potassium 4.3; Sodium 145   Lipid Panel    Component Value Date/Time   CHOL 201 (H) 06/30/2014 0255   TRIG 96 06/30/2014 0255   HDL 53 06/30/2014 0255   CHOLHDL 3.8 06/30/2014 0255   VLDL 19 06/30/2014 0255   LDLCALC 129 (H) 06/30/2014 0255     Other studies Reviewed: Additional studies/ records that were reviewed today with results demonstrating: thoracic aortic stent graft report.   ASSESSMENT AND PLAN:  1. Type B aortic dissection in 2015: Now with paraparesis of the lower extremities caused by spinal cord  ischemia. She uses a wheelchair. 2. Hypertensive cardiovascular disease:blood pressure elevated today.  She missed a dose of her meds today.  Increase amlodipine to to 10 mg daily as a once a day medicine may allow for better compliance than a 3 x/day medicine. 3. CRI:Cr. 2.0 in 10/17 4. She requests percocets for leg pain.  I asked her to go back to her PMD.     Current medicines are reviewed at length with the patient today.  The patient concerns regarding her medicines were  addressed.  The following changes have been made:  No change  Labs/ tests ordered today include:  No orders of the defined types were placed in this encounter.   Recommend 150 minutes/week of aerobic exercise Low fat, low carb, high fiber diet recommended  Disposition:   FU in 6 months    Signed, Lance Muss, MD  03/22/2017 9:35 PM    [facility] [address] [place], [place], [place]  [address] Phone: [phone]; Fax: [phone]

## 2017-03-23 ENCOUNTER — Ambulatory Visit: Payer: Medicaid Other | Admitting: Interventional Cardiology

## 2017-03-29 ENCOUNTER — Encounter: Payer: Self-pay | Admitting: Interventional Cardiology

## 2017-03-29 ENCOUNTER — Emergency Department (HOSPITAL_COMMUNITY): Payer: Medicaid Other

## 2017-03-29 ENCOUNTER — Inpatient Hospital Stay (HOSPITAL_COMMUNITY)
Admission: EM | Admit: 2017-03-29 | Discharge: 2017-04-01 | DRG: 313 | Disposition: A | Discharge status: Home / Self Care | Payer: Medicaid Other | Attending: Family Medicine | Admitting: Family Medicine

## 2017-03-29 ENCOUNTER — Encounter (HOSPITAL_COMMUNITY): Payer: Self-pay | Admitting: *Deleted

## 2017-03-29 DIAGNOSIS — I160 Hypertensive urgency: Secondary | ICD-10-CM | POA: Diagnosis present

## 2017-03-29 DIAGNOSIS — Z8673 Personal history of transient ischemic attack (TIA), and cerebral infarction without residual deficits: Secondary | ICD-10-CM

## 2017-03-29 DIAGNOSIS — G8220 Paraplegia, unspecified: Secondary | ICD-10-CM | POA: Diagnosis present

## 2017-03-29 DIAGNOSIS — R079 Chest pain, unspecified: Secondary | ICD-10-CM | POA: Diagnosis not present

## 2017-03-29 DIAGNOSIS — Z95828 Presence of other vascular implants and grafts: Secondary | ICD-10-CM

## 2017-03-29 DIAGNOSIS — G9511 Acute infarction of spinal cord (embolic) (nonembolic): Secondary | ICD-10-CM

## 2017-03-29 DIAGNOSIS — K219 Gastro-esophageal reflux disease without esophagitis: Secondary | ICD-10-CM | POA: Diagnosis present

## 2017-03-29 DIAGNOSIS — I69354 Hemiplegia and hemiparesis following cerebral infarction affecting left non-dominant side: Secondary | ICD-10-CM

## 2017-03-29 DIAGNOSIS — Z888 Allergy status to other drugs, medicaments and biological substances status: Secondary | ICD-10-CM

## 2017-03-29 DIAGNOSIS — E785 Hyperlipidemia, unspecified: Secondary | ICD-10-CM | POA: Diagnosis present

## 2017-03-29 DIAGNOSIS — Z8249 Family history of ischemic heart disease and other diseases of the circulatory system: Secondary | ICD-10-CM

## 2017-03-29 DIAGNOSIS — F319 Bipolar disorder, unspecified: Secondary | ICD-10-CM | POA: Diagnosis present

## 2017-03-29 DIAGNOSIS — F419 Anxiety disorder, unspecified: Secondary | ICD-10-CM | POA: Diagnosis present

## 2017-03-29 DIAGNOSIS — N183 Chronic kidney disease, stage 3 unspecified: Secondary | ICD-10-CM | POA: Diagnosis present

## 2017-03-29 DIAGNOSIS — I130 Hypertensive heart and chronic kidney disease with heart failure and stage 1 through stage 4 chronic kidney disease, or unspecified chronic kidney disease: Secondary | ICD-10-CM | POA: Diagnosis present

## 2017-03-29 DIAGNOSIS — I5032 Chronic diastolic (congestive) heart failure: Secondary | ICD-10-CM | POA: Diagnosis present

## 2017-03-29 DIAGNOSIS — I2510 Atherosclerotic heart disease of native coronary artery without angina pectoris: Secondary | ICD-10-CM | POA: Diagnosis present

## 2017-03-29 DIAGNOSIS — E663 Overweight: Secondary | ICD-10-CM | POA: Diagnosis present

## 2017-03-29 DIAGNOSIS — R51 Headache: Secondary | ICD-10-CM

## 2017-03-29 DIAGNOSIS — Q613 Polycystic kidney, unspecified: Secondary | ICD-10-CM

## 2017-03-29 DIAGNOSIS — H532 Diplopia: Secondary | ICD-10-CM | POA: Diagnosis not present

## 2017-03-29 DIAGNOSIS — Z79899 Other long term (current) drug therapy: Secondary | ICD-10-CM

## 2017-03-29 DIAGNOSIS — R519 Headache, unspecified: Secondary | ICD-10-CM

## 2017-03-29 DIAGNOSIS — F17210 Nicotine dependence, cigarettes, uncomplicated: Secondary | ICD-10-CM | POA: Diagnosis present

## 2017-03-29 DIAGNOSIS — Z833 Family history of diabetes mellitus: Secondary | ICD-10-CM

## 2017-03-29 DIAGNOSIS — Z993 Dependence on wheelchair: Secondary | ICD-10-CM

## 2017-03-29 DIAGNOSIS — Z8679 Personal history of other diseases of the circulatory system: Secondary | ICD-10-CM

## 2017-03-29 DIAGNOSIS — Z823 Family history of stroke: Secondary | ICD-10-CM

## 2017-03-29 DIAGNOSIS — Z6821 Body mass index (BMI) 21.0-21.9, adult: Secondary | ICD-10-CM

## 2017-03-29 DIAGNOSIS — G8929 Other chronic pain: Secondary | ICD-10-CM | POA: Diagnosis present

## 2017-03-29 DIAGNOSIS — R0789 Other chest pain: Principal | ICD-10-CM | POA: Diagnosis present

## 2017-03-29 DIAGNOSIS — N184 Chronic kidney disease, stage 4 (severe): Secondary | ICD-10-CM | POA: Diagnosis present

## 2017-03-29 DIAGNOSIS — F17200 Nicotine dependence, unspecified, uncomplicated: Secondary | ICD-10-CM | POA: Diagnosis present

## 2017-03-29 DIAGNOSIS — I10 Essential (primary) hypertension: Secondary | ICD-10-CM

## 2017-03-29 DIAGNOSIS — M545 Low back pain: Secondary | ICD-10-CM | POA: Diagnosis present

## 2017-03-29 DIAGNOSIS — Z9889 Other specified postprocedural states: Secondary | ICD-10-CM

## 2017-03-29 DIAGNOSIS — Z7982 Long term (current) use of aspirin: Secondary | ICD-10-CM

## 2017-03-29 LAB — I-STAT TROPONIN, ED: Troponin i, poc: 0.03 ng/mL (ref 0.00–0.08)

## 2017-03-29 LAB — BASIC METABOLIC PANEL
Anion gap: 12 (ref 5–15)
BUN: 30 mg/dL — AB (ref 6–20)
CO2: 19 mmol/L — ABNORMAL LOW (ref 22–32)
Calcium: 9.3 mg/dL (ref 8.9–10.3)
Chloride: 110 mmol/L (ref 101–111)
Creatinine, Ser: 2.12 mg/dL — ABNORMAL HIGH (ref 0.44–1.00)
GFR calc non Af Amer: 25 mL/min — ABNORMAL LOW (ref >60)
GFR, EST AFRICAN AMERICAN: 30 mL/min — AB (ref >60)
Glucose, Bld: 100 mg/dL — ABNORMAL HIGH (ref 65–99)
POTASSIUM: 3.9 mmol/L (ref 3.5–5.1)
SODIUM: 141 mmol/L (ref 135–145)

## 2017-03-29 LAB — CBC
HEMATOCRIT: 39.7 % (ref 36.0–46.0)
Hemoglobin: 13 g/dL (ref 12.0–15.0)
MCH: 28.7 pg (ref 26.0–34.0)
MCHC: 32.7 g/dL (ref 30.0–36.0)
MCV: 87.6 fL (ref 78.0–100.0)
Platelets: 225 10*3/uL (ref 150–400)
RBC: 4.53 MIL/uL (ref 3.87–5.11)
RDW: 14.1 % (ref 11.5–15.5)
WBC: 6.1 10*3/uL (ref 4.0–10.5)

## 2017-03-29 MED ORDER — FAMOTIDINE 20 MG PO TABS
20.0000 mg | ORAL_TABLET | Freq: Two times a day (BID) | ORAL | Status: DC
  Administered 2017-03-29 – 2017-04-01 (×6): 20 mg via ORAL
  Filled 2017-03-29 (×6): qty 1

## 2017-03-29 MED ORDER — LABETALOL HCL 5 MG/ML IV SOLN
20.0000 mg | Freq: Once | INTRAVENOUS | Status: DC
  Filled 2017-03-29: qty 4

## 2017-03-29 MED ORDER — GI COCKTAIL ~~LOC~~
30.0000 mL | Freq: Four times a day (QID) | ORAL | Status: DC | PRN
  Administered 2017-03-30: 30 mL via ORAL
  Filled 2017-03-29: qty 30

## 2017-03-29 MED ORDER — LABETALOL HCL 5 MG/ML IV SOLN
10.0000 mg | Freq: Once | INTRAVENOUS | Status: AC
  Administered 2017-03-29: 10 mg via INTRAVENOUS

## 2017-03-29 MED ORDER — LORAZEPAM 1 MG PO TABS
1.0000 mg | ORAL_TABLET | Freq: Once | ORAL | Status: AC
  Administered 2017-03-29: 1 mg via ORAL
  Filled 2017-03-29: qty 1

## 2017-03-29 MED ORDER — ENSURE ENLIVE PO LIQD
237.0000 mL | Freq: Two times a day (BID) | ORAL | Status: DC
  Administered 2017-03-30 – 2017-04-01 (×3): 237 mL via ORAL

## 2017-03-29 MED ORDER — AMLODIPINE BESYLATE 10 MG PO TABS
10.0000 mg | ORAL_TABLET | Freq: Every day | ORAL | Status: DC
  Administered 2017-03-30 – 2017-04-01 (×3): 10 mg via ORAL
  Filled 2017-03-29 (×3): qty 1

## 2017-03-29 MED ORDER — ENOXAPARIN SODIUM 40 MG/0.4ML ~~LOC~~ SOLN
40.0000 mg | SUBCUTANEOUS | Status: DC
  Administered 2017-03-30: 40 mg via SUBCUTANEOUS
  Filled 2017-03-29 (×2): qty 0.4

## 2017-03-29 MED ORDER — ASPIRIN EC 81 MG PO TBEC: 81.0000 mg | DELAYED_RELEASE_TABLET | Freq: Every day | ORAL | Status: DC

## 2017-03-29 MED ORDER — NITROGLYCERIN 0.4 MG SL SUBL: 0.4000 mg | SUBLINGUAL_TABLET | SUBLINGUAL | Status: DC | PRN

## 2017-03-29 MED ORDER — MORPHINE SULFATE (PF) 4 MG/ML IV SOLN: 2.0000 mg | INTRAVENOUS | Status: DC | PRN

## 2017-03-29 MED ORDER — GI COCKTAIL ~~LOC~~
30.0000 mL | ORAL | Status: AC
  Administered 2017-03-29: 30 mL via ORAL
  Filled 2017-03-29: qty 30

## 2017-03-29 MED ORDER — SODIUM CHLORIDE 0.9 % IV SOLN
INTRAVENOUS | Status: DC
  Administered 2017-03-29 – 2017-03-30 (×2): via INTRAVENOUS

## 2017-03-29 MED ORDER — TRAZODONE HCL 50 MG PO TABS
50.0000 mg | ORAL_TABLET | Freq: Every day | ORAL | Status: DC
  Administered 2017-03-29 – 2017-03-31 (×3): 50 mg via ORAL
  Filled 2017-03-29 (×3): qty 1

## 2017-03-29 MED ORDER — ACETAMINOPHEN 325 MG PO TABS
650.0000 mg | ORAL_TABLET | ORAL | Status: DC | PRN
  Administered 2017-03-31 (×2): 650 mg via ORAL
  Filled 2017-03-29 (×2): qty 2

## 2017-03-29 MED ORDER — ASPIRIN EC 81 MG PO TBEC
81.0000 mg | DELAYED_RELEASE_TABLET | Freq: Every day | ORAL | Status: DC
  Administered 2017-03-30 – 2017-04-01 (×3): 81 mg via ORAL
  Filled 2017-03-29 (×3): qty 1

## 2017-03-29 MED ORDER — ADULT MULTIVITAMIN W/MINERALS CH
1.0000 | ORAL_TABLET | Freq: Every day | ORAL | Status: DC
  Administered 2017-03-30 – 2017-04-01 (×3): 1 via ORAL
  Filled 2017-03-29 (×3): qty 1

## 2017-03-29 MED ORDER — ATORVASTATIN CALCIUM 20 MG PO TABS
20.0000 mg | ORAL_TABLET | Freq: Every day | ORAL | Status: DC
  Administered 2017-03-30 – 2017-04-01 (×3): 20 mg via ORAL
  Filled 2017-03-29 (×3): qty 1

## 2017-03-29 MED ORDER — GABAPENTIN 300 MG PO CAPS
300.0000 mg | ORAL_CAPSULE | Freq: Three times a day (TID) | ORAL | Status: DC
  Administered 2017-03-29 – 2017-04-01 (×8): 300 mg via ORAL
  Filled 2017-03-29 (×9): qty 1

## 2017-03-29 MED ORDER — HYDRALAZINE HCL 20 MG/ML IJ SOLN
10.0000 mg | INTRAMUSCULAR | Status: DC | PRN
  Administered 2017-03-30 – 2017-03-31 (×3): 10 mg via INTRAVENOUS
  Filled 2017-03-29 (×3): qty 1

## 2017-03-29 MED ORDER — LABETALOL HCL 200 MG PO TABS
300.0000 mg | ORAL_TABLET | Freq: Two times a day (BID) | ORAL | Status: DC
  Administered 2017-03-29 – 2017-04-01 (×5): 300 mg via ORAL
  Filled 2017-03-29 (×6): qty 1

## 2017-03-29 MED ORDER — ONDANSETRON HCL 4 MG/2ML IJ SOLN
4.0000 mg | Freq: Four times a day (QID) | INTRAMUSCULAR | Status: DC | PRN
  Administered 2017-03-31: 4 mg via INTRAVENOUS

## 2017-03-29 MED ORDER — METHOCARBAMOL 500 MG PO TABS
500.0000 mg | ORAL_TABLET | Freq: Three times a day (TID) | ORAL | Status: DC
  Administered 2017-03-29 – 2017-04-01 (×8): 500 mg via ORAL
  Filled 2017-03-29 (×9): qty 1

## 2017-03-29 MED ORDER — NICOTINE 21 MG/24HR TD PT24
21.0000 mg | MEDICATED_PATCH | Freq: Every day | TRANSDERMAL | Status: DC
  Administered 2017-03-29 – 2017-04-01 (×4): 21 mg via TRANSDERMAL
  Filled 2017-03-29 (×4): qty 1

## 2017-03-29 MED ORDER — IPRATROPIUM-ALBUTEROL 0.5-2.5 (3) MG/3ML IN SOLN
3.0000 mL | RESPIRATORY_TRACT | Status: DC | PRN
  Filled 2017-03-29: qty 3

## 2017-03-29 MED ORDER — BACLOFEN 10 MG PO TABS
10.0000 mg | ORAL_TABLET | Freq: Three times a day (TID) | ORAL | Status: DC | PRN
  Administered 2017-03-30: 10 mg via ORAL
  Filled 2017-03-29: qty 1

## 2017-03-29 NOTE — ED Provider Notes (Signed)
[facility]-EMERGENCY DEPT Provider Note   CSN: [id number] Arrival date & time: 03/29/17  1702     History   Chief Complaint Chief Complaint  Patient presents with  . Chest Pain    HPI Charlotte [name] is a 51 y.o. female.  Patient is medically complex. Poor historian. She reports that she had been outdoors all day. Got home. Was complaining of a headache that she reports was consistent with prior episodes of high blood pressure. She took a hydralazine tablet. She subsequently had palpitations. EMS was called. Currently still having headache, palpitations, shortness of breath. No vision changes. At baseline she has some left sided weakness  secondary to an old CVA; no new weakness or changes in sensation. She also has a history of a thoracic aorta dissection type B; no tearing or ripping chest pain or back pain. No fevers or chills. No infectious symptoms.    The history is provided by the patient and a relative.  Illness  This is a new problem. The current episode started less than 1 hour ago. The problem occurs constantly. The problem has not changed since onset.Associated symptoms include chest pain, headaches and shortness of breath. Pertinent negatives include no abdominal pain.    Past Medical History:  Diagnosis Date  . Anemia   . Anxiety   . Arthritis    "shoulders; hands" (01/16/2015)  . Bipolar disorder (HCC)   . Chronic lower back pain   . Complication of anesthesia    "alot of back pains from the epidurals"  . Daily headache   . Depression   . Dissecting aneurysm of thoracic aorta, Stanford type B (HCC) 06/29/2014   Charlotte [name] 07/05/2014  . Family history of anesthesia complication    "alot of back pains from the epidurals"  . GERD (gastroesophageal reflux disease)   . Hypertension   . Migraine    "sometimes twice/day" (01/16/2015)  . 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 Polycystic kidney disease    /notes 07/05/2014  .  Renal insufficiency   . Stroke (HCC) 06/29/2014   residual BLE weakness    Patient Active Problem List   Diagnosis Date Noted  . Hypertensive urgency 03/29/2017  . Dissection of aorta (HCC)   . Dehydration 01/19/2015  . Acute renal failure superimposed on stage 3 chronic kidney disease (HCC) 01/18/2015  . Pain in the chest   . Chest pain 01/16/2015  . Unstable angina (HCC) 01/16/2015  . 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 01/16/2015  . H/O: CVA (cerebrovascular accident) 01/16/2015  . Renal insufficiency 10/16/2014  . Essential hypertension 10/16/2014  . Esophageal reflux 10/16/2014  . Muscle spasm 10/16/2014  . Thoracic aortic dissection (HCC) 08/22/2014  . Other secondary hypertension 08/01/2014  . Anxiety 07/29/2014  . 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 Chronic renal insufficiency, stage III (moderate) 07/19/2014  . Dyslipidemia 07/19/2014  . 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 Polycystic kidney disease 06/29/2014  . Normocytic anemia 02/09/2011  . TOBACCO ABUSE 02/09/2011  . Cocaine abuse 02/09/2011  . HTN (hypertension), malignant 02/09/2011    Past Surgical History:  Procedure Laterality Date  . CESAREAN SECTION  1986  . SPINAL DRAIN PLACEMENT  06/29/2014  . SPINAL DRAIN REMOVAL  07/02/2014  . THORACENTESIS  07/05/2014   Charlotte [name] 07/05/2014  . THORACIC AORTIC ENDOVASCULAR STENT GRAFT N/A 08/23/2014   Procedure: ENDOVASCULAR THORACIC AORTIC  STENT GRAFT;  Surgeon: Sherren Kerns, MD;  Location: [facility];  Service: Vascular;  Laterality: N/A;  . TUBAL LIGATION  1986    OB History    No data available       Home Medications    Prior to Admission medications   Medication Sig Start Date End Date Taking? Authorizing Provider  amLODipine (NORVASC) 10 MG tablet Take 1 tablet (10 mg total) by mouth daily. 09/03/16  Yes Corky Crafts, MD  atorvastatin (LIPITOR) 20 MG tablet Take  20 mg by mouth daily.   Yes [provider]  baclofen (LIORESAL) 10 MG tablet Take 10 mg by mouth 3 (three) times daily as needed for muscle spasms.    Yes [provider]  famotidine (PEPCID) 20 MG tablet Take 20 mg by mouth 2 (two) times daily.   Yes [provider]  gabapentin (NEURONTIN) 300 MG capsule Take 300 mg by mouth 3 (three) times daily.   Yes [provider]  hydrALAZINE (APRESOLINE) 100 MG tablet Take 1 tablet (100 mg total) by mouth 2 (two) times daily. 08/31/16  Yes Pisciotta, Joni Reining, PA-C  labetalol (NORMODYNE) 300 MG tablet Take 1 tablet (300 mg total) by mouth 2 (two) times daily. 08/31/16  Yes Pisciotta, Joni Reining, PA-C  methocarbamol (ROBAXIN) 500 MG tablet Take 500 mg by mouth 3 (three) times daily.   Yes [provider]  Multiple Vitamin (MULTIVITAMIN WITH MINERALS) TABS tablet Take 1 tablet by mouth daily.   Yes [provider]  traMADol (ULTRAM) 50 MG tablet Take 50 mg by mouth every 6 (six) hours as needed.   Yes [provider]  traZODone (DESYREL) 50 MG tablet Take 50 mg by mouth at bedtime.   Yes [provider]  aspirin EC 81 MG tablet Take 1 tablet (81 mg total) by mouth daily. Patient not taking: Reported on 03/29/2017 09/18/15   Richarda Overlie, MD  isosorbide mononitrate (IMDUR) 30 MG 24 hr tablet Take 1 tablet (30 mg total) by mouth daily. Patient not taking: Reported on 03/29/2017 09/18/15   Richarda Overlie, MD    Family History Family History  Problem Relation Age of Onset  . Stroke Father   . Hypertension Father   . Hypertension Mother   . Diabetes type II Other   . Hypertension Sister   . Diabetes Paternal Grandfather   . Heart attack Neg Hx     Social History Social History  Substance Use Topics  . Smoking status: Current Every Day Smoker    Packs/day: 1.50    Years: 37.00    Types: Cigarettes  . Smokeless tobacco: Never Used  . Alcohol use 0.0 oz/week     Comment: "recovering  alcoholic since 2013"     Allergies   Seroquel [quetiapine] and Olanzapine   Review of Systems Review of Systems  Constitutional: Negative for chills and fever.  Eyes: Negative for visual disturbance.  Respiratory: Positive for shortness of breath. Negative for cough.   Cardiovascular: Positive for chest pain and palpitations. Negative for leg swelling.  Gastrointestinal: Negative for abdominal pain, blood in stool, diarrhea, nausea and vomiting.  Musculoskeletal: Negative for back pain and neck pain.  Neurological: Positive for weakness (L sidedc/w baseline) and headaches. Negative for seizures, syncope, facial asymmetry and numbness.  All other systems reviewed and are negative.    Physical Exam Updated Vital Signs BP (!) 166/93   Pulse 70   Temp 98.9 F (37.2 C) (Oral)   Resp 14  Ht 5\' 2"  (1.575 m) Comment: estimate, patient and daughter unsure  Wt 53.8 kg   SpO2 91%   BMI 21.67 kg/m   Physical Exam  Constitutional: She appears well-developed and well-nourished.  Non-toxic appearance. She appears distressed.  SBP 190s  HENT:  Head: Normocephalic and atraumatic.  Eyes: Conjunctivae are normal.  Neck: Neck supple.  Cardiovascular: Normal rate and regular rhythm.   No murmur heard. Pulmonary/Chest: Effort normal and breath sounds normal. No respiratory distress. She has no wheezes. She has no rhonchi.  Abdominal: Soft. There is no tenderness.  Musculoskeletal: She exhibits no edema.  Neurological: She is alert.  Skin: Skin is warm and dry.  Psychiatric: She has a normal mood and affect.  Nursing note and vitals reviewed.    ED Treatments / Results  Labs (all labs ordered are listed, but only abnormal results are displayed) Labs Reviewed  BASIC METABOLIC PANEL - Abnormal; Notable for the following:       Result Value   CO2 19 (*)    Glucose, Bld 100 (*)    BUN 30 (*)    Creatinine, Ser 2.12 (*)    GFR calc non Af Amer 25 (*)    GFR calc Af Amer 30 (*)      All other components within normal limits  CBC  HIV ANTIBODY (ROUTINE TESTING)  TROPONIN I  TROPONIN I  RAPID URINE DRUG SCREEN, HOSP PERFORMED  BASIC METABOLIC PANEL  CBC WITH DIFFERENTIAL/PLATELET  LIPID PANEL  I-STAT TROPOININ, ED    EKG  EKG Interpretation  Date/Time:  Tuesday Mar 29 2017 17:04:00 EDT Ventricular Rate:  80 PR Interval:  122 QRS Duration: 90 QT Interval:  372 QTC Calculation: 429 R Axis:   60 Text Interpretation:  Normal sinus rhythm Left ventricular hypertrophy with repolarization abnormality Abnormal ECG No significant change since last tracing Confirmed by KNAPP  MD-J, JON ([id number]) on 03/29/2017 5:08:08 PM       Radiology Dg Chest 2 View  Result Date: 03/29/2017 CLINICAL DATA:  Acute onset of generalized chest pain and shortness of breath. Headache and tachycardia. Initial encounter. EXAM: CHEST  2 VIEW COMPARISON:  Chest radiograph performed 02/12/2016, and CTA of the chest performed 08/31/2016 FINDINGS: The lungs are well-aerated. There is no evidence of focal opacification, pleural effusion or pneumothorax. The heart is normal in size; a thoracic aortic stent graft is noted. No acute osseous abnormalities are seen. IMPRESSION: No acute cardiopulmonary process seen. Electronically Signed   By: Roanna RaiderJeffery  Chang M.D.   On: 03/29/2017 18:25    Procedures Procedures (including critical care time)  Medications Ordered in ED Medications  methocarbamol (ROBAXIN) tablet 500 mg (500 mg Oral Given 03/29/17 2321)  amLODipine (NORVASC) tablet 10 mg (not administered)  famotidine (PEPCID) tablet 20 mg (20 mg Oral Given 03/29/17 2321)  labetalol (NORMODYNE) tablet 300 mg (300 mg Oral Given 03/29/17 2321)  traZODone (DESYREL) tablet 50 mg (50 mg Oral Given 03/29/17 2321)  atorvastatin (LIPITOR) tablet 20 mg (not administered)  baclofen (LIORESAL) tablet 10 mg (not administered)  multivitamin with minerals tablet 1 tablet (not administered)  gabapentin (NEURONTIN) capsule  300 mg (300 mg Oral Given 03/29/17 2321)  acetaminophen (TYLENOL) tablet 650 mg (not administered)  ondansetron (ZOFRAN) injection 4 mg (not administered)  enoxaparin (LOVENOX) injection 40 mg (not administered)  morphine 4 MG/ML injection 2 mg (not administered)  gi cocktail (Maalox,Lidocaine,Donnatal) (not administered)  0.9 %  sodium chloride infusion ( Intravenous New Bag/Given 03/29/17 2323)  nicotine (NICODERM  CQ - dosed in mg/24 hours) patch 21 mg (21 mg Transdermal Patch Applied 03/29/17 2352)  hydrALAZINE (APRESOLINE) injection 10 mg (not administered)  aspirin EC tablet 81 mg (not administered)  ipratropium-albuterol (DUONEB) 0.5-2.5 (3) MG/3ML nebulizer solution 3 mL (not administered)  nitroGLYCERIN (NITROSTAT) SL tablet 0.4 mg (not administered)  feeding supplement (ENSURE ENLIVE) (ENSURE ENLIVE) liquid 237 mL (not administered)  LORazepam (ATIVAN) tablet 1 mg (1 mg Oral Given 03/29/17 1740)  labetalol (NORMODYNE,TRANDATE) injection 10 mg (10 mg Intravenous Given 03/29/17 1829)  gi cocktail (Maalox,Lidocaine,Donnatal) (30 mLs Oral Given 03/29/17 2017)     Initial Impression / Assessment and Plan / ED Course  I have reviewed the triage vital signs and the nursing notes.  Pertinent labs & imaging results that were available during my care of the patient were reviewed by me and considered in my medical decision making (see chart for details).  Clinical Course as of Mar 30 8  Tue Mar 29, 2017  1831 DG Chest 2 View [SO]  [id number] DG Chest 2 View [SO]    Clinical Course User Index [SO] Lindalou Hose, MD    Patient is a poor historian. Was complaining of chest pain. EKG is consistent with her prior. First troponin is detectable but negative. Patient is high risk. Will admit for an ACS rule out. Chest x-ray without evidence of pneumothorax or pneumonia. Patient does have a history of thoracic aortic dissection. Description of symptoms is not consistent with dissection. Stent appears to be  consistent with prior evaluations. Description of symptoms also not consistent with pulmonary embolism. Patient was also hypertensive initially systolic 190s. Given a dose of labetalol with improvement in vital signs. Patient also complaining of anxiety, given 1 dose of oral Ativan.  Final Clinical Impressions(s) / ED Diagnoses   Final diagnoses:  Chest pain, unspecified type  Hypertension, unspecified type    New Prescriptions Current Discharge Medication List       Lindalou Hose, MD 03/30/17 [phone]

## 2017-03-29 NOTE — ED Notes (Signed)
MD to change order to tele, bed placement aware

## 2017-03-29 NOTE — ED Notes (Signed)
Admitting at bedside 

## 2017-03-29 NOTE — H&P (Addendum)
History and Physical    [name]:[id number] DOB: Jun 09, 1966 DOA: 03/29/2017  Referring MD/NP/PA: Lindalou Hose, MD (resident) PCP: [facility]  Patient coming from: home via EMS  Chief Complaint: Chest pain  HPI: Charlotte [name] is a 51 y.o. 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 At baseline the patient is wheelchair-bound and lives alone with in-home aides that help assist with daily needs. Patient notes that she had been outdoors all day and had not been feeling well complaining of a headache around 1:30 PM. She thought that her blood pressure may be elevated and therefore her daughter fixed her some food including fried chicken, french fries, and a soda. Her daughter pain gave her 100 mg of hydralazine which the patient usually does not take as she feels that it gives her a headache. Approximately 10 minutes after eating patient noted acute onset of recurrent palpitations with racing heart feeling and burning chest pain across her entire chest. Patient rates pain a 10/10 on the pain scale. Patient actually notes that the pain has come and gone intermittently since her birthday last month. Last episode approximately 1-1/2 week ago. She has tried nothing to relieve the pain. Associated symptoms include social stressors, inability to belch, shortness of breath, and fatigue. Denies any nausea, vomiting, diaphoresis, new focal weakness, or changes in vision. Patient still reports smoking [place] to 1 pack cigarettes per day on average especially when stressed and drinks alcohol approximately 2-3 times per week on average. Patient was last followed up with Dr. Eldridge Dace of cardiology as an outpatient.   ED  Course: En route with EMS patient received 324 mg of aspirin and 1 sublingual nitroglycerin. Upon admission into the emergency department patient was seen to be afebrile, pulse 77-98, respirations 13-21, blood pressure elevated up to 196/102, and O2 saturations maintain on room air. Labs revealed CO2 19, BUN 30, creatinine 2.12, and trop <0.03 . Chest x-ray showed no acute abnormalities.  Patient was given 1 mg of Ativan orally and 10 mg of labetalol IV to help with blood pressure.  Review of Systems: As per HPI otherwise 10 point review of systems negative.   Past Medical History:  Diagnosis Date  . Anemia   . Anxiety   . Arthritis    "shoulders; hands" (01/16/2015)  . Bipolar disorder (HCC)   . Chronic lower back pain   . Complication of anesthesia    "alot of back pains from the epidurals"  . Daily headache   . Depression   . 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 Family history of anesthesia complication    "alot of back pains from the epidurals"  . GERD (gastroesophageal reflux disease)   . Hypertension   . Migraine    "sometimes twice/day" (01/16/2015)  . 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 Polycystic kidney disease    /notes 07/05/2014  . Renal insufficiency   . Stroke (HCC) 06/29/2014   residual BLE weakness    Past Surgical History:  Procedure Laterality Date  . CESAREAN SECTION  1986  . SPINAL DRAIN PLACEMENT  06/29/2014  . SPINAL DRAIN REMOVAL  07/02/2014  . THORACENTESIS  07/05/2014   [name]/notes 07/05/2014  . THORACIC AORTIC ENDOVASCULAR STENT GRAFT N/A 08/23/2014   Procedure: ENDOVASCULAR THORACIC AORTIC  STENT GRAFT;  Surgeon: Sherren Kernsharles E Fields, MD;  [facility];  Service: Vascular;  Laterality: N/A;  . TUBAL LIGATION  1986     reports that she has been smoking Cigarettes.  She has a 55.50 pack-year smoking history. She has never used smokeless tobacco. She reports  that she drinks alcohol. She reports that she uses drugs, including "Crack" cocaine.  Allergies  Allergen Reactions  . Seroquel [Quetiapine] Other (See Comments)    Night sweats, dizziness and possible confusion  . Olanzapine Nausea And Vomiting    Family History  Problem Relation Age of Onset  . Stroke Father   . Hypertension Father   . Hypertension Mother   . Diabetes type II Other   . Hypertension Sister   . Diabetes Paternal Grandfather   . Heart attack Neg Hx     Prior to Admission medications   Medication Sig Start Date End Date Taking? Authorizing Provider  amLODipine (NORVASC) 10 MG tablet Take 1 tablet (10 mg total) by mouth daily. 09/03/16  Yes Corky CraftsVaranasi, Jayadeep S, MD  atorvastatin (LIPITOR) 20 MG tablet Take 20 mg by mouth daily.   Yes [provider]  baclofen (LIORESAL) 10 MG tablet Take 10 mg by mouth 3 (three) times daily as needed for muscle spasms.    Yes [provider]  famotidine (PEPCID) 20 MG tablet Take 20 mg by mouth 2 (two) times daily.   Yes [provider]  gabapentin (NEURONTIN) 300 MG capsule Take 300 mg by mouth 3 (three) times daily.   Yes [provider]  hydrALAZINE (APRESOLINE) 100 MG tablet Take 1 tablet (100 mg total) by mouth 2 (two) times daily. 08/31/16  Yes Pisciotta, Joni ReiningNicole, PA-C  labetalol (NORMODYNE) 300 MG tablet Take 1 tablet (300 mg total) by mouth 2 (two) times daily. 08/31/16  Yes Pisciotta, Joni ReiningNicole, PA-C  methocarbamol (ROBAXIN) 500 MG tablet Take 500 mg by mouth 3 (three) times daily.   Yes [provider]  Multiple Vitamin (MULTIVITAMIN WITH MINERALS) TABS tablet Take 1 tablet by mouth daily.   Yes [provider]  traMADol (ULTRAM) 50 MG tablet Take 50 mg by mouth every 6 (six) hours as needed.   Yes [provider]  traZODone (DESYREL) 50 MG tablet Take 50 mg by mouth at bedtime.   Yes [provider]  aspirin EC 81 MG tablet Take 1 tablet (81 mg total) by  mouth daily. Patient not taking: Reported on 03/29/2017 09/18/15   Richarda OverlieAbrol, Nayana, MD  isosorbide mononitrate (IMDUR) 30 MG 24 hr tablet Take 1 tablet (30 mg total) by mouth daily. Patient not taking: Reported on 03/29/2017 09/18/15   Richarda OverlieAbrol, Nayana, MD    Physical Exam:    Constitutional: Older female who appears to be in some mild distress able to get comfortable on the hospital gurney Vitals:   03/29/17 1730 03/29/17 1830 03/29/17 1845 03/29/17 1900  BP: (!) 196/102 (!) 171/108 (!) 158/113 (!) 165/94  Pulse: 98 88 83 88  Resp: 20 17 (!) 21 13  Temp:      TempSrc:      SpO2: 98% 98% 100% 98%   Eyes: PERRL, lids and conjunctivae normal ENMT: Mucous membranes are moist. Posterior pharynx clear of any exudate or lesions.  Neck: normal, supple, no masses, no thyromegaly, no JVD. Respiratory: Decreased overall aeration noted  bilaterally, no wheezing, no crackles. Normal respiratory effort. No accessory muscle use. Some mild tenderness to palpation of the sternum. Cardiovascular: Regular rate and rhythm, no murmurs / rubs / gallops. No extremity edema. 2+ pedal pulses. No carotid bruits.  Abdomen: no tenderness, no masses palpated. No hepatosplenomegaly. Bowel sounds positive.  Musculoskeletal: no clubbing / cyanosis. No joint deformity upper and lower extremities. Good ROM, poor muscle tone in the bilateral lower extremities. Skin: no rashes, lesions, ulcers. No induration Neurologic: CN 2-12 grossly intact. Sensation intact, DTR normal. Strength 4/5 in left upper and b/l lower extremities. Psychiatric: Fair judgment and insight. Alert and oriented x 3. Anxious mood.     Labs on Admission: I have personally reviewed following labs and imaging studies  CBC:  Recent Labs Lab 03/29/17 1742  WBC 6.1  HGB 13.0  HCT 39.7  MCV 87.6  PLT 225   Basic Metabolic Panel:  Recent Labs Lab 03/29/17 1742  NA 141  K 3.9  CL 110  CO2 19*  GLUCOSE 100*  BUN 30*  CREATININE 2.12*   CALCIUM 9.3   GFR: CrCl cannot be calculated (Unknown ideal weight.). Liver Function Tests: No results for input(s): AST, ALT, ALKPHOS, BILITOT, PROT, ALBUMIN in the last 168 hours. No results for input(s): LIPASE, AMYLASE in the last 168 hours. No results for input(s): AMMONIA in the last 168 hours. Coagulation Profile: No results for input(s): INR, PROTIME in the last 168 hours. Cardiac Enzymes: No results for input(s): CKTOTAL, CKMB, CKMBINDEX, TROPONINI in the last 168 hours. BNP (last 3 results) No results for input(s): PROBNP in the last 8760 hours. HbA1C: No results for input(s): HGBA1C in the last 72 hours. CBG: No results for input(s): GLUCAP in the last 168 hours. Lipid Profile: No results for input(s): CHOL, HDL, LDLCALC, TRIG, CHOLHDL, LDLDIRECT in the last 72 hours. Thyroid Function Tests: No results for input(s): TSH, T4TOTAL, FREET4, T3FREE, THYROIDAB in the last 72 hours. Anemia Panel: No results for input(s): VITAMINB12, FOLATE, FERRITIN, TIBC, IRON, RETICCTPCT in the last 72 hours. Urine analysis:    Component Value Date/Time   COLORURINE YELLOW 01/18/2015 1602   APPEARANCEUR CLEAR 01/18/2015 1602   LABSPEC 1.027 01/18/2015 1602   PHURINE 5.0 01/18/2015 1602   GLUCOSEU NEGATIVE 01/18/2015 1602   HGBUR NEGATIVE 01/18/2015 1602   BILIRUBINUR NEGATIVE 01/18/2015 1602   KETONESUR NEGATIVE 01/18/2015 1602   PROTEINUR NEGATIVE 01/18/2015 1602   UROBILINOGEN 0.2 01/18/2015 1602   NITRITE NEGATIVE 01/18/2015 1602   LEUKOCYTESUR NEGATIVE 01/18/2015 1602   Sepsis Labs: No results found for this or any previous visit (from the past 240 hour(s)).   Radiological Exams on Admission: Dg Chest 2 View  Result Date: 03/29/2017 CLINICAL DATA:  Acute onset of generalized chest pain and shortness of breath. Headache and tachycardia. Initial encounter. EXAM: CHEST  2 VIEW COMPARISON:  Chest radiograph performed 02/12/2016, and CTA of the chest performed 08/31/2016 FINDINGS:  The lungs are well-aerated. There is no evidence of focal opacification, pleural effusion or pneumothorax. The heart is normal in size; a thoracic aortic stent graft is noted. No acute osseous abnormalities are seen. IMPRESSION: No acute cardiopulmonary process seen. Electronically Signed   By: Roanna Raider M.D.   On: 03/29/2017 18:25    EKG: Independently reviewed. Sinus rhythm Unchanged  Assessment/Plan Chest pain: Patient presents with complaints of intermittent burning chest pain she rates a 10/10 on the pain scale. Heart score 5. Initial troponin negative and EKG unchanged from previous tracings. Upon review of  meds. appears that she has not recently been given isosorbide mononitrate or aspirin. Patient had resolution of chest pain following GI cocktail. - Admit to stepdown bed - Chest pain order set initiated - Nitroglycerin/morphine prn chest pain - GI cocktail prn indigestion  - Trend cardiac enzymes - check UDS and lipid panel  - Check echocardiogram in a.m. - ASA - Question restarting isosorbide mononitrate - May warrant consult cardiology in a.m. for further evaluation  Hypertensive urgency: Patient's initial blood pressure was elevated up to 196/102. She does not take hydralazine routinely due to reported side effects of headache.  - Continue amlodipine and labetalol - Hydralazine IV prn - Will likely need to consider another option for blood pressure management as patient does take po hydralazine at home. Patient was last seen by Dr. Eldridge Dace of cardiology as an outpatient in 08/2016.  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 Patient is wheelchair bound - Continue baclofen and gabapentin      H/O CVA (cerebrovascular accident) with residual left-sided deficits: Stable. - Continue aspirin   CKD stage III: Creatinine mildly elevated at 2.12 with a BUN of 30. Patient notes being outside  and possibly decrease oral intake to suggest possible dehydration as a cause for slowly trending creatinine. Baseline creatinine previously have been between 1.73 and 2. - NS at 75 ml/hr - Recheck BMP in a.m.  Hyperlipidemia  - Continue atorvastatin  Esophageal reflux - Continue Pepcid twice a day   Tobacco abuse: Patient reports smoking less than 1 pack cigarettes per day on average - Nicotine patch - Counseled patient on the need of cessation of tobacco   DVT prophylaxis: lovenox Code Status:Full Family Communication: Schedule  Disposition Plan: Likely discharge home if workup negative  Consults called: None  Admission status: observation  Clydie Braun MD [facility] Pager [phone]  If 7PM-7AM, please contact night-coverage [email or web address] Password [facility]  03/29/2017, 7:53 PM

## 2017-03-29 NOTE — ED Triage Notes (Signed)
[facility]- pt reports CP/SOB starting at 4pm. Burning and heart racing is how pt describes it. No associated symptoms. Pt reports headache. Pt received 324mg  Asprin and 1 nitro. Pt has 20g in left hand.

## 2017-03-29 NOTE — ED Provider Notes (Signed)
Patient presents to emergency room with complaints of burning chest pain. She does history of multiple medical problems including aortic dissection and coronary artery disease.  Patient is having burning pain here in the emergency room.  On exam heart regular rhythm. Lungs are clear to auscultation  Plan on checking cardiac enzymes, basic laboratory tests, and chest x-ray. We'll monitor for acute cardiac ischemia. We'll review x-ray for any signs of widening mediastinum.  Consider CT imaging if her symptoms persist.  Pt 's symptoms improved while she was in the ED.  BP improved with treatment.  Pt unfortunately has multiple medical problems, continues to smoke. She is at hight risk for recurrent ACS.  Admitted to the hospital for further treatment.   EKG Interpretation  Date/Time:  Tuesday Mar 29 2017 17:04:00 EDT Ventricular Rate:  80 PR Interval:  122 QRS Duration: 90 QT Interval:  372 QTC Calculation: 429 R Axis:   60 Text Interpretation:  Normal sinus rhythm Left ventricular hypertrophy with repolarization abnormality Abnormal ECG No significant change since last tracing Confirmed by Dewon Mendizabal  MD-J, Bresha Hosack ([id number]) on 03/29/2017 5:08:08 PM      I saw and evaluated the patient, reviewed the resident's note and I agree with the findings and plan.   Linwood DibblesKnapp, Mazen Marcin, MD 03/29/17 2204

## 2017-03-29 NOTE — ED Notes (Signed)
Admitting at the bedside.  

## 2017-03-30 ENCOUNTER — Observation Stay (HOSPITAL_BASED_OUTPATIENT_CLINIC_OR_DEPARTMENT_OTHER): Payer: Medicaid Other

## 2017-03-30 DIAGNOSIS — E785 Hyperlipidemia, unspecified: Secondary | ICD-10-CM | POA: Diagnosis present

## 2017-03-30 DIAGNOSIS — M545 Low back pain: Secondary | ICD-10-CM | POA: Diagnosis present

## 2017-03-30 DIAGNOSIS — Z833 Family history of diabetes mellitus: Secondary | ICD-10-CM | POA: Diagnosis not present

## 2017-03-30 DIAGNOSIS — F419 Anxiety disorder, unspecified: Secondary | ICD-10-CM | POA: Diagnosis present

## 2017-03-30 DIAGNOSIS — Z823 Family history of stroke: Secondary | ICD-10-CM | POA: Diagnosis not present

## 2017-03-30 DIAGNOSIS — I340 Nonrheumatic mitral (valve) insufficiency: Secondary | ICD-10-CM

## 2017-03-30 DIAGNOSIS — Z7982 Long term (current) use of aspirin: Secondary | ICD-10-CM | POA: Diagnosis not present

## 2017-03-30 DIAGNOSIS — R079 Chest pain, unspecified: Secondary | ICD-10-CM | POA: Diagnosis not present

## 2017-03-30 DIAGNOSIS — I69354 Hemiplegia and hemiparesis following cerebral infarction affecting left non-dominant side: Secondary | ICD-10-CM | POA: Diagnosis not present

## 2017-03-30 DIAGNOSIS — N184 Chronic kidney disease, stage 4 (severe): Secondary | ICD-10-CM | POA: Diagnosis present

## 2017-03-30 DIAGNOSIS — G8220 Paraplegia, unspecified: Secondary | ICD-10-CM | POA: Diagnosis present

## 2017-03-30 DIAGNOSIS — G8929 Other chronic pain: Secondary | ICD-10-CM | POA: Diagnosis present

## 2017-03-30 DIAGNOSIS — F1410 Cocaine abuse, uncomplicated: Secondary | ICD-10-CM | POA: Diagnosis not present

## 2017-03-30 DIAGNOSIS — N183 Chronic kidney disease, stage 3 (moderate): Secondary | ICD-10-CM | POA: Diagnosis not present

## 2017-03-30 DIAGNOSIS — Z8249 Family history of ischemic heart disease and other diseases of the circulatory system: Secondary | ICD-10-CM | POA: Diagnosis not present

## 2017-03-30 DIAGNOSIS — K219 Gastro-esophageal reflux disease without esophagitis: Secondary | ICD-10-CM | POA: Diagnosis present

## 2017-03-30 DIAGNOSIS — F17200 Nicotine dependence, unspecified, uncomplicated: Secondary | ICD-10-CM | POA: Diagnosis not present

## 2017-03-30 DIAGNOSIS — Q613 Polycystic kidney, unspecified: Secondary | ICD-10-CM | POA: Diagnosis not present

## 2017-03-30 DIAGNOSIS — I160 Hypertensive urgency: Secondary | ICD-10-CM | POA: Diagnosis present

## 2017-03-30 DIAGNOSIS — Z95828 Presence of other vascular implants and grafts: Secondary | ICD-10-CM | POA: Diagnosis not present

## 2017-03-30 DIAGNOSIS — G9511 Acute infarction of spinal cord (embolic) (nonembolic): Secondary | ICD-10-CM | POA: Diagnosis not present

## 2017-03-30 DIAGNOSIS — E663 Overweight: Secondary | ICD-10-CM | POA: Diagnosis present

## 2017-03-30 DIAGNOSIS — I10 Essential (primary) hypertension: Secondary | ICD-10-CM | POA: Diagnosis not present

## 2017-03-30 DIAGNOSIS — I130 Hypertensive heart and chronic kidney disease with heart failure and stage 1 through stage 4 chronic kidney disease, or unspecified chronic kidney disease: Secondary | ICD-10-CM | POA: Diagnosis present

## 2017-03-30 DIAGNOSIS — Z79899 Other long term (current) drug therapy: Secondary | ICD-10-CM | POA: Diagnosis not present

## 2017-03-30 DIAGNOSIS — I5032 Chronic diastolic (congestive) heart failure: Secondary | ICD-10-CM | POA: Diagnosis present

## 2017-03-30 DIAGNOSIS — I2510 Atherosclerotic heart disease of native coronary artery without angina pectoris: Secondary | ICD-10-CM | POA: Diagnosis present

## 2017-03-30 DIAGNOSIS — F319 Bipolar disorder, unspecified: Secondary | ICD-10-CM | POA: Diagnosis present

## 2017-03-30 DIAGNOSIS — R0789 Other chest pain: Secondary | ICD-10-CM | POA: Diagnosis not present

## 2017-03-30 DIAGNOSIS — H532 Diplopia: Secondary | ICD-10-CM | POA: Diagnosis not present

## 2017-03-30 DIAGNOSIS — F17210 Nicotine dependence, cigarettes, uncomplicated: Secondary | ICD-10-CM | POA: Diagnosis present

## 2017-03-30 LAB — RAPID URINE DRUG SCREEN, HOSP PERFORMED
Amphetamines: NOT DETECTED
Barbiturates: NOT DETECTED
Benzodiazepines: NOT DETECTED
COCAINE: POSITIVE — AB
OPIATES: NOT DETECTED
TETRAHYDROCANNABINOL: NOT DETECTED

## 2017-03-30 LAB — BASIC METABOLIC PANEL
ANION GAP: 10 (ref 5–15)
BUN: 34 mg/dL — ABNORMAL HIGH (ref 6–20)
CALCIUM: 8.4 mg/dL — AB (ref 8.9–10.3)
CHLORIDE: 110 mmol/L (ref 101–111)
CO2: 21 mmol/L — AB (ref 22–32)
Creatinine, Ser: 2.18 mg/dL — ABNORMAL HIGH (ref 0.44–1.00)
GFR calc Af Amer: 29 mL/min — ABNORMAL LOW (ref >60)
GFR calc non Af Amer: 25 mL/min — ABNORMAL LOW (ref >60)
GLUCOSE: 104 mg/dL — AB (ref 65–99)
Potassium: 3 mmol/L — ABNORMAL LOW (ref 3.5–5.1)
Sodium: 141 mmol/L (ref 135–145)

## 2017-03-30 LAB — CBC WITH DIFFERENTIAL/PLATELET
BASOS PCT: 1 %
Basophils Absolute: 0 10*3/uL (ref 0.0–0.1)
Eosinophils Absolute: 0.1 10*3/uL (ref 0.0–0.7)
Eosinophils Relative: 2 %
HEMATOCRIT: 34.4 % — AB (ref 36.0–46.0)
HEMOGLOBIN: 11.1 g/dL — AB (ref 12.0–15.0)
LYMPHS ABS: 1.7 10*3/uL (ref 0.7–4.0)
LYMPHS PCT: 37 %
MCH: 28.5 pg (ref 26.0–34.0)
MCHC: 32.3 g/dL (ref 30.0–36.0)
MCV: 88.2 fL (ref 78.0–100.0)
MONO ABS: 0.3 10*3/uL (ref 0.1–1.0)
MONOS PCT: 5 %
NEUTROS ABS: 2.5 10*3/uL (ref 1.7–7.7)
Neutrophils Relative %: 55 %
Platelets: 193 10*3/uL (ref 150–400)
RBC: 3.9 MIL/uL (ref 3.87–5.11)
RDW: 14.2 % (ref 11.5–15.5)
WBC: 4.6 10*3/uL (ref 4.0–10.5)

## 2017-03-30 LAB — ECHOCARDIOGRAM COMPLETE
Height: 62 in
WEIGHTICAEL: 1816 [oz_av]

## 2017-03-30 LAB — URINALYSIS, ROUTINE W REFLEX MICROSCOPIC
BILIRUBIN URINE: NEGATIVE
Glucose, UA: NEGATIVE mg/dL
Ketones, ur: NEGATIVE mg/dL
NITRITE: NEGATIVE
PH: 5 (ref 5.0–8.0)
Protein, ur: 100 mg/dL — AB
SPECIFIC GRAVITY, URINE: 1.01 (ref 1.005–1.030)

## 2017-03-30 LAB — TROPONIN I
TROPONIN I: 0.03 ng/mL — AB (ref <0.03)
TROPONIN I: 0.04 ng/mL — AB (ref <0.03)
Troponin I: 0.04 ng/mL (ref <0.03)
Troponin I: 0.05 ng/mL (ref <0.03)
Troponin I: 0.04 ng/mL (ref <0.03)

## 2017-03-30 LAB — LIPID PANEL
CHOLESTEROL: 185 mg/dL (ref 0–200)
HDL: 74 mg/dL (ref >40)
LDL Cholesterol: 94 mg/dL (ref 0–99)
TRIGLYCERIDES: 84 mg/dL (ref <150)
Total CHOL/HDL Ratio: 2.5 RATIO
VLDL: 17 mg/dL (ref 0–40)

## 2017-03-30 LAB — HIV ANTIBODY (ROUTINE TESTING W REFLEX): HIV Screen 4th Generation wRfx: NONREACTIVE

## 2017-03-30 LAB — MAGNESIUM: Magnesium: 2.1 mg/dL (ref 1.7–2.4)

## 2017-03-30 LAB — GLUCOSE, CAPILLARY
Glucose-Capillary: 90 mg/dL (ref 65–99)
Glucose-Capillary: 120 mg/dL — ABNORMAL HIGH (ref 65–99)

## 2017-03-30 MED ORDER — TRAMADOL HCL 50 MG PO TABS
50.0000 mg | ORAL_TABLET | Freq: Four times a day (QID) | ORAL | Status: DC | PRN
  Administered 2017-03-30 – 2017-03-31 (×2): 50 mg via ORAL
  Filled 2017-03-30 (×2): qty 1

## 2017-03-30 MED ORDER — REGADENOSON 0.4 MG/5ML IV SOLN
0.4000 mg | Freq: Once | INTRAVENOUS | Status: AC
  Administered 2017-03-31: 0.4 mg via INTRAVENOUS
  Filled 2017-03-30: qty 5

## 2017-03-30 MED ORDER — POTASSIUM CHLORIDE CRYS ER 20 MEQ PO TBCR
40.0000 meq | EXTENDED_RELEASE_TABLET | Freq: Once | ORAL | Status: AC
  Administered 2017-03-30: 40 meq via ORAL
  Filled 2017-03-30: qty 2

## 2017-03-30 MED ORDER — POTASSIUM CHLORIDE CRYS ER 20 MEQ PO TBCR
60.0000 meq | EXTENDED_RELEASE_TABLET | ORAL | Status: AC
  Administered 2017-03-30: 60 meq via ORAL
  Filled 2017-03-30: qty 3

## 2017-03-30 MED ORDER — ENOXAPARIN SODIUM 30 MG/0.3ML ~~LOC~~ SOLN
30.0000 mg | SUBCUTANEOUS | Status: DC
  Administered 2017-03-31 – 2017-04-01 (×2): 30 mg via SUBCUTANEOUS
  Filled 2017-03-30 (×2): qty 0.3

## 2017-03-30 NOTE — Progress Notes (Signed)
PROGRESS NOTE    Charlotte [name]  WUJ:[id number] DOB: 15-Apr-1966 DOA: 03/29/2017 PCP: [facility]   Outpatient Specialists:     Brief Narrative:  Charlotte [name] is a 51 y.o. 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 At baseline the patient is wheelchair-bound and lives alone with in-home aides that help assist with daily needs. Patient notes that she had been outdoors all day and had not been feeling well complaining of a headache around 1:30 PM. She thought that her blood pressure may be elevated and therefore her daughter fixed her some food including fried chicken, french fries, and a soda. Her daughter pain gave her 100 mg of hydralazine which the patient usually does not take as she feels that it gives her a headache. Approximately 10 minutes after eating patient noted acute onset of recurrent palpitations with racing heart feeling and burning chest pain across her entire chest. Patient rates pain a 10/10 on the pain scale. Patient actually notes that the pain has come and gone intermittently since her birthday last month. Last episode approximately 1-1/2 week ago. She has tried nothing to relieve the pain. Associated symptoms include social stressors, inability to belch, shortness of breath, and fatigue. Denies any nausea, vomiting, diaphoresis, new focal weakness, or changes in vision. Patient still reports smoking [place] to 1 pack cigarettes per day on average especially when stressed and drinks alcohol approximately 2-3 times per week on average. Patient was last followed up with Dr. Eldridge Dace of cardiology as an outpatient.   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 Patient is wheelchair bound - Continue baclofen and gabapentin    H/O CVA (cerebrovascular accident) with residual left-sided deficits: Stable. - Continue aspirin   CKD stage III:  -follow with daily BMPs  Hyperlipidemia  - Continue atorvastatin  Esophageal reflux - Continue Pepcid twice a day   Tobacco abuse: Patient reports smoking less than 1 pack cigarettes per day on average - Nicotine patch    DVT prophylaxis:  Lovenox   Code Status: Full Code   Family Communication: daughter  Disposition Plan:     Consultants:   cards     Subjective: No current chest pain  Objective: Vitals:   03/30/17 0527 03/30/17 0630 03/30/17 0759 03/30/17 1150  BP:  (!) 183/119 110/76   Pulse:   78 76  Resp:   16   Temp:   98 F (36.7 C) 98.2 F (36.8 C)  TempSrc: Oral  Oral Oral  SpO2:   98%  96%  Weight:      Height:        Intake/Output Summary (Last 24 hours) at 03/30/17 1312 Last data filed at 03/30/17 0400  Gross per 24 hour  Intake           496.25 ml  Output                0 ml  Net           496.25 ml   Filed Weights   03/29/17 2300 03/30/17 0522  Weight: 53.8 kg (118 lb 8 oz) 51.5 kg (113 lb 8 oz)    Examination:  General exam: Appears calm and comfortable    Respiratory system: diminished, no wheezing Cardiovascular system: rrr Gastrointestinal system: Abdomen is nondistended, soft and nontender. No organomegaly or masses felt. Normal bowel sounds heard. Central nervous system: poor muscle tone in b/l LE .     Data Reviewed: I have personally reviewed following labs and imaging studies  CBC:  Recent Labs Lab 03/29/17 1742 03/30/17 0226  WBC 6.1 4.6  NEUTROABS  --  2.5  HGB 13.0 11.1*  HCT 39.7 34.4*  MCV 87.6 88.2  PLT 225 193   Basic Metabolic Panel:  Recent Labs Lab 03/29/17 1742 03/30/17 0226 03/30/17 0601  NA 141 141  --   K 3.9 3.0*  --   CL 110 110  --   CO2 19* 21*  --   GLUCOSE 100* 104*  --   BUN 30* 34*  --   CREATININE 2.12* 2.18*  --   CALCIUM 9.3 8.4*  --   MG  --   --  2.1   GFR: Estimated Creatinine Clearance: 23.6 mL/min (A) (by C-G formula based on SCr of 2.18 mg/dL (H)). Liver Function Tests: No results for input(s): AST, ALT, ALKPHOS, BILITOT, PROT, ALBUMIN in the last 168 hours. No results for input(s): LIPASE, AMYLASE in the last 168 hours. No results for input(s): AMMONIA in the last 168 hours. Coagulation Profile: No results for input(s): INR, PROTIME in the last 168 hours. Cardiac Enzymes:  Recent Labs Lab 03/29/17 2227 03/30/17 0226 03/30/17 0601 03/30/17 1022  TROPONINI 0.04* 0.05* 0.04* 0.03*   BNP (last 3 results) No results for input(s): PROBNP in the last 8760 hours. HbA1C: No results for input(s): HGBA1C in the last 72 hours. CBG:  Recent Labs Lab 03/30/17 0759 03/30/17 1132  GLUCAP 90 120*   Lipid Profile:  Recent Labs  03/30/17 0226  CHOL 185  HDL 74  LDLCALC 94  TRIG 84  CHOLHDL 2.5   Thyroid Function Tests: No results for input(s): TSH, T4TOTAL, FREET4, T3FREE, THYROIDAB in the last 72 hours. Anemia Panel: No results for input(s): VITAMINB12, FOLATE, FERRITIN, TIBC, IRON, RETICCTPCT in the last 72 hours. Urine analysis:    Component Value Date/Time    COLORURINE YELLOW 03/30/2017 0556   APPEARANCEUR HAZY (A) 03/30/2017 0556   LABSPEC 1.010 03/30/2017 0556   PHURINE 5.0 03/30/2017 0556   GLUCOSEU NEGATIVE 03/30/2017 0556   HGBUR MODERATE (A) 03/30/2017 0556   BILIRUBINUR NEGATIVE 03/30/2017 0556   KETONESUR NEGATIVE 03/30/2017 0556   PROTEINUR 100 (A) 03/30/2017 0556   UROBILINOGEN 0.2 01/18/2015 1602   NITRITE NEGATIVE 03/30/2017 0556   LEUKOCYTESUR TRACE (A) 03/30/2017 0556     )No results found for this or any previous visit (from the past 240 hour(s)).    Anti-infectives    None       Radiology Studies: Dg Chest 2  View  Result Date: 03/29/2017 CLINICAL DATA:  Acute onset of generalized chest pain and shortness of breath. Headache and tachycardia. Initial encounter. EXAM: CHEST  2 VIEW COMPARISON:  Chest radiograph performed 02/12/2016, and CTA of the chest performed 08/31/2016 FINDINGS: The lungs are well-aerated. There is no evidence of focal opacification, pleural effusion or pneumothorax. The heart is normal in size; a thoracic aortic stent graft is noted. No acute osseous abnormalities are seen. IMPRESSION: No acute cardiopulmonary process seen. Electronically Signed   By: Roanna Raider M.D.   On: 03/29/2017 18:25        Scheduled Meds: . amLODipine  10 mg Oral Daily  . aspirin EC  81 mg Oral Daily  . atorvastatin  20 mg Oral Daily  . enoxaparin (LOVENOX) injection  40 mg Subcutaneous Q24H  . famotidine  20 mg Oral BID  . feeding supplement (ENSURE ENLIVE)  237 mL Oral BID BM  . gabapentin  300 mg Oral TID  . labetalol  300 mg Oral BID  . methocarbamol  500 mg Oral TID  . multivitamin with minerals  1 tablet Oral Daily  . nicotine  21 mg Transdermal Daily  . [START ON 03/31/2017] regadenoson  0.4 mg Intravenous Once  . traZODone  50 mg Oral QHS   Continuous Infusions: . sodium chloride 75 mL/hr at 03/30/17 1307     LOS: 0 days    Time spent: 25 min    Ardit Danh U Ercil Cassis, DO [facility] Pager [phone]  If 7PM-7AM, please contact night-coverage [email or web address] Password [email or web address] 03/30/2017, 1:12 PM

## 2017-03-30 NOTE — Plan of Care (Signed)
Problem: Education: Goal: Knowledge of [place] [facility] Education information/materials will improve Outcome: Not Progressing Patient has short attention span. Would briefly listen but then demand her needs such as a sandwich or to sleep. Daughter able to be educated and is at bedside.   Problem: Health Behavior/Discharge Planning: Goal: Ability to manage health-related needs will improve Outcome: Not Progressing Patient lives alone with occasional help of home care aids and assistance from children. Patient's oriented but has periods of lethargy and agitation. Daughter notes that the agitation is normal for her. Instead of speaking, patient will have a fit and lash out shaking her arms. When arriving to floor patient would "fall asleep" before answering nurse's questions but as soon as sternal rub applied patient woke up and yelled at nurse asking why she would do such a thing.

## 2017-03-30 NOTE — Progress Notes (Signed)
  Echocardiogram 2D Echocardiogram has been performed.  Nolon RodBrown, Tony 03/30/2017, 2:27 PM

## 2017-03-30 NOTE — Consult Note (Signed)
Cardiology Consult    Patient ID: Charlotte [name] MRN: [id number], DOB/AGE: July 20, 1966   Admit date: 03/29/2017 Date of Consult: 03/30/2017  Primary Physician: Alain MarionPa, [facility] Reason for Consult: Chest pain Primary Cardiologist: Dr Eldridge DaceVaranasi Requesting Provider: Dr. Benjamine MolaVann  History of Present Illness    Charlotte [name] is a 51 y.o. female who is being seen today for the evaluation of chest pain at the request of Dr. Benjamine MolaVann. 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 Bipolar disorder, and GERD.   She is difficult to obtain information from this morning as she just wants to eat and she has already answered these questions. She says that she came to the hospital for high blood pressure which she knew was high because of a severe headache. She does have a BP cuff at home. She also notes Chest burning and aching in the central chest that started yesterday and is constant. It feels like her reflux, but worse. She had new onset of shortness of breath yesterday, which she is vague about, none at present. She is currently lying almost flat with no breathing difficulty and she has no edema.   She was last seen by Dr. Eldridge DaceVaranasi in 08/2016 for follow up of her aortic dissection at which time her blood pressure was high. Amlodipine was increased to 10 mg to allow for better compliance than a 3 times per day med.   The patient reports that hydralazine gives her a headache and recently her medication was changed, but she cannot tell me what the new medication is or if she has been taking it. Her daughter gives her her medications.   The patient smokes about 1PPD cigarettes and drinks occasional alcohol approx 2-3 times per week. She  uses a motorized wheelchair to get around. Her daughter takes care of her.   Past Medical History   Past Medical History:  Diagnosis Date  . Anemia   . Anxiety   . Arthritis    "shoulders; hands" (01/16/2015)  . Bipolar disorder (HCC)   . Chronic lower back pain   . Complication of anesthesia    "alot of back pains from the epidurals"  . Daily headache   . Depression   . 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 Family history of anesthesia complication    "alot of back pains from the epidurals"  . GERD (gastroesophageal reflux disease)   . Hypertension   . Migraine    "sometimes twice/day" (01/16/2015)  . 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 Polycystic kidney disease    /notes 07/05/2014  . Renal insufficiency   . Stroke (HCC) 06/29/2014   residual BLE weakness    Past Surgical History:  Procedure Laterality Date  . CESAREAN SECTION  1986  . SPINAL DRAIN PLACEMENT  06/29/2014  . SPINAL DRAIN REMOVAL  07/02/2014  . THORACENTESIS  07/05/2014   [name]/notes 07/05/2014  . THORACIC AORTIC ENDOVASCULAR STENT GRAFT N/A 08/23/2014   Procedure: ENDOVASCULAR THORACIC AORTIC  STENT GRAFT;  Surgeon: Sherren Kernsharles E Fields, MD;  [facility];  Service: Vascular;  Laterality: N/A;  . TUBAL LIGATION  1986  Allergies  Allergies  Allergen Reactions  . Seroquel [Quetiapine] Other (See Comments)    Night sweats, dizziness and possible confusion  . Olanzapine Nausea And Vomiting    Inpatient Medications    . amLODipine  10 mg Oral Daily  . aspirin EC  81 mg Oral Daily  . atorvastatin  20 mg Oral Daily  . enoxaparin (LOVENOX) injection  40 mg Subcutaneous Q24H  . famotidine  20 mg Oral BID  . feeding supplement (ENSURE ENLIVE)  237 mL Oral BID BM  . gabapentin  300 mg Oral TID  . labetalol  300 mg Oral BID  . methocarbamol  500 mg Oral TID  . multivitamin with minerals  1 tablet Oral Daily    . nicotine  21 mg Transdermal Daily  . traZODone  50 mg Oral QHS    Family History    Family History  Problem Relation Age of Onset  . Stroke Father   . Hypertension Father   . Hypertension Mother   . Diabetes type II Other   . Hypertension Sister   . Diabetes Paternal Grandfather   . Heart attack Neg Hx      Social History    Social History   Social History  . Marital status: Single    Spouse name: N/A  . Number of children: N/A  . Years of education: N/A   Occupational History  . Not on file.   Social History Main Topics  . Smoking status: Current Every Day Smoker    Packs/day: 1.50    Years: 37.00    Types: Cigarettes  . Smokeless tobacco: Never Used  . Alcohol use 0.0 oz/week     Comment: "recovering alcoholic since 2013"  . Drug use: Yes    Types: "Crack" cocaine     Comment:  "recovering cocaine addict since 2013"  . Sexual activity: Not on file   Other Topics Concern  . Not on file   Social History Narrative  . No narrative on file     Review of Systems    General:  No chills, fever, night sweats or weight changes.  Cardiovascular:  Positive for chest pain as above, No dyspnea on exertion, edema, orthopnea, palpitations, paroxysmal nocturnal dyspnea. Dermatological: No rash, lesions/masses Respiratory: No cough, dyspnea Urologic: No hematuria, dysuria Abdominal:   No nausea, vomiting, diarrhea, bright red blood per rectum, melena, or hematemesis Neurologic:  No visual changes, wkns, changes in mental status. All other systems reviewed and are otherwise negative except as noted above.  Physical Exam    Blood pressure 110/76, pulse 78, temperature 98 F (36.7 C), temperature source Oral, resp. rate 16, height 5\' 2"  (1.575 m), weight 113 lb 8 oz (51.5 kg), SpO2 98 %.  General: Thin AA female, pulls sheet over her head during assessment, NAD Psych: irritable affect. Neuro: Alert and oriented X 3. Has lower extremity paraparesis and left sided  weakness HEENT: Normal  Neck: Supple without bruits or JVD. Lungs:  Resp regular and unlabored, CTA. Heart: RRR no s3, s4, or murmurs. Abdomen: Soft, non-tender, non-distended, BS + x 4.  Extremities: No clubbing, cyanosis or edema. DP/PT/Radials 2+ and equal bilaterally.  Labs    Troponin [facility] Test)  Recent Labs  03/29/17 1751  TROPIPOC 0.03    Recent Labs  03/29/17 2227 03/30/17 0226 03/30/17 0601  TROPONINI 0.04* 0.05* 0.04*   Lab Results  Component Value Date   WBC 4.6 03/30/2017   HGB 11.1 (L) 03/30/2017  HCT 34.4 (L) 03/30/2017   MCV 88.2 03/30/2017   PLT 193 03/30/2017    Recent Labs Lab 03/30/17 0226  NA 141  K 3.0*  CL 110  CO2 21*  BUN 34*  CREATININE 2.18*  CALCIUM 8.4*  GLUCOSE 104*   Lab Results  Component Value Date   CHOL 185 03/30/2017   HDL 74 03/30/2017   LDLCALC 94 03/30/2017   TRIG 84 03/30/2017   Lab Results  Component Value Date   DDIMER 2.06 (H) 02/12/2016     Radiology Studies    Dg Chest 2 View  Result Date: 03/29/2017 CLINICAL DATA:  Acute onset of generalized chest pain and shortness of breath. Headache and tachycardia. Initial encounter. EXAM: CHEST  2 VIEW COMPARISON:  Chest radiograph performed 02/12/2016, and CTA of the chest performed 08/31/2016 FINDINGS: The lungs are well-aerated. There is no evidence of focal opacification, pleural effusion or pneumothorax. The heart is normal in size; a thoracic aortic stent graft is noted. No acute osseous abnormalities are seen. IMPRESSION: No acute cardiopulmonary process seen. Electronically Signed   By: Roanna Raider M.D.   On: 03/29/2017 18:25    EKG & Cardiac Imaging    EKG: NSR 80 bpm, LVH with repolarization abnormality, TWI V3-V6--previously present in 08/2016 QTC 429  Echocardiogram:   Last echo 09/17/2015 Study Conclusions  - Left ventricle: The cavity size was normal. There was moderate   concentric hypertrophy of the apex. Systolic function was    vigorous. The estimated ejection fraction was in the range of 65%   to 70%. Wall motion was normal; there were no regional wall   motion abnormalities. Doppler parameters are consistent with   abnormal left ventricular relaxation (grade 1 diastolic   dysfunction). - Mitral valve: There was mild regurgitation.  Impressions:  - Compared to the prior study, there has been no significant   interval change.   Assessment & Plan    Chest pain -Atypical and pt is difficult to obtain details from. Constant burning and aching since yesterday, like her reflux but worse -Troponins very mildly elevated 0.04, 0.05, 0.04 -EKG without acute ischemic changes, similar to previous EKG -CXR shows no acute cardiopulmonary process, thoracic aortic stent graft noted -Pt does not have evidence of ACS. She likely has intensified GERD. Will schedule for [facility] tomorrow (pt has eaten) continue to monitor symptoms and work on blood pressure control.  Hypertension -Home meds include amlodipine 10 mg, hydralazine 100 mg bid (pt was not taking due to side effect of headache), labetalol 300 mg bid, imdur 30 mg daily -BP up this morning with good response to prn hydralazine 10 mg IV. -Difficult to find medication that the patient will tolerate and can be compliant with.   Chronic diastolic heart failure -Pt appears euvolemic. Chest xray does not indicate pulmonary congestion.   CKD Stage III-IV -SCr 2.18  (baseline 1.4-1.9) -Likely related to uncontrolled hypertension  Signed, Berton Bon, NP-C 03/30/2017, 8:09 AM Pager: [phone]  Patient examined chart reviewed. Patient has already eaten breakfast this am. Obese black female Left sided weakness and not very ambulatory due to spinal cord ischemia with aneurysm repair and stroke. Atypical chest Pain R/O no acute ECG changes BP well controlled Will order lexiscan myovue for am  Charlton Haws

## 2017-03-31 ENCOUNTER — Inpatient Hospital Stay (HOSPITAL_COMMUNITY): Payer: Medicaid Other

## 2017-03-31 DIAGNOSIS — R079 Chest pain, unspecified: Secondary | ICD-10-CM

## 2017-03-31 DIAGNOSIS — I10 Essential (primary) hypertension: Secondary | ICD-10-CM

## 2017-03-31 LAB — BASIC METABOLIC PANEL
ANION GAP: 11 (ref 5–15)
BUN: 25 mg/dL — ABNORMAL HIGH (ref 6–20)
CALCIUM: 9.1 mg/dL (ref 8.9–10.3)
CHLORIDE: 111 mmol/L (ref 101–111)
CO2: 18 mmol/L — AB (ref 22–32)
Creatinine, Ser: 2.1 mg/dL — ABNORMAL HIGH (ref 0.44–1.00)
GFR calc Af Amer: 30 mL/min — ABNORMAL LOW (ref >60)
GFR calc non Af Amer: 26 mL/min — ABNORMAL LOW (ref >60)
GLUCOSE: 110 mg/dL — AB (ref 65–99)
Potassium: 5.4 mmol/L — ABNORMAL HIGH (ref 3.5–5.1)
Sodium: 140 mmol/L (ref 135–145)

## 2017-03-31 LAB — NM MYOCAR MULTI W/SPECT W/WALL MOTION / EF
CHL CUP NUCLEAR SDS: 4
CHL CUP NUCLEAR SSS: 11
CHL CUP RESTING HR STRESS: 63 {beats}/min
CSEPED: 6 min
CSEPEDS: 47 s
CSEPEW: 1 METS
LV sys vol: 61 mL
LVDIAVOL: 126 mL (ref 46–106)
Peak HR: 101 {beats}/min
RATE: 0.36
SRS: 7
TID: 1.05

## 2017-03-31 MED ORDER — LORAZEPAM 2 MG/ML IJ SOLN: 0.5000 mg | Freq: Four times a day (QID) | INTRAMUSCULAR | Status: DC | PRN

## 2017-03-31 MED ORDER — TECHNETIUM TC 99M TETROFOSMIN IV KIT
10.0000 | PACK | Freq: Once | INTRAVENOUS | Status: AC | PRN
  Administered 2017-03-31: 10 via INTRAVENOUS

## 2017-03-31 MED ORDER — REGADENOSON 0.4 MG/5ML IV SOLN
INTRAVENOUS | Status: AC
  Administered 2017-03-31: 0.4 mg via INTRAVENOUS
  Filled 2017-03-31: qty 5

## 2017-03-31 MED ORDER — HYDRALAZINE HCL 20 MG/ML IJ SOLN
INTRAMUSCULAR | Status: AC
  Administered 2017-03-31: 10 mg via INTRAVENOUS
  Filled 2017-03-31: qty 1

## 2017-03-31 MED ORDER — ONDANSETRON HCL 4 MG/2ML IJ SOLN
INTRAMUSCULAR | Status: AC
  Filled 2017-03-31: qty 2

## 2017-03-31 MED ORDER — TECHNETIUM TC 99M TETROFOSMIN IV KIT
30.0000 | PACK | Freq: Once | INTRAVENOUS | Status: AC | PRN
  Administered 2017-03-31: 30 via INTRAVENOUS

## 2017-03-31 NOTE — Progress Notes (Signed)
Patient presented for Lexiscan. Tolerated procedure well. Pending final stress imaging result.  He is hypertensive at presentation. BP improved after she received morning meds. She is bradycardic at baseline, primary team to address BB.

## 2017-03-31 NOTE — Progress Notes (Signed)
When asked re pain or SOB states has some pain right breast area. When asked what # she would give her pain after explaining that 10 is horrible and 1 is a little, she stated 9/10. Asked if her pain is very bad , she said yes. In no distress and falls asleep spontaneously. BP 213/116, heart rate 54. Will let R Barrett PA (who has been paged) know.

## 2017-03-31 NOTE — Progress Notes (Signed)
03/31/2017 3:40 PM  Pt complaining of headaches and diplopia.  I'm concerned about drug withdrawal but will order CT head without contrast for completeness, monitor for cocaine withdrawal.  Vitals stable. Clinical exam stable. Updated daughter at bedside.    Charlotte [name]. Christophere Hillhouse, MD

## 2017-03-31 NOTE — Progress Notes (Signed)
Charlotte [name] Thompson PA on call instead of R Barrett PA. Aware of patient chest pain and elevated BP and that I am about to give her 10 mg IV Hydralazine. IV Hydralazine given, as well as po Amlodipine. Labetolol PO not given as heart rate 54. Dawn her nurse is aware of above. Patient BP 169 94

## 2017-03-31 NOTE — Progress Notes (Signed)
Initial Nutrition Assessment  DOCUMENTATION CODES:   Not applicable  INTERVENTION:   Ensure Enlive po BID, each supplement provides 350 kcal and 20 grams of protein  NUTRITION DIAGNOSIS:   Inadequate oral intake related to nausea, vomiting as evidenced by per patient/family report.  GOAL:   Patient will meet greater than or equal to 90% of their needs  MONITOR:   PO intake, Supplement acceptance  REASON FOR ASSESSMENT:   Malnutrition Screening Tool    ASSESSMENT:   51 y.o.femalewith PMH of HTN, diastolic CHF, aortic dissection type B s/p stent, LE paresthesias 2/2 spinal cord ischemia from dissection, CKD stage III-IV, CVA with residual lt sided deficits, bipolar disorder, and GERD; who presentswith complaints ofchest pain. At baseline the patient is wheelchair-bound and lives alone with in-home aides that help assist with daily needs. Cocaine positive on admission.   Spoke with patient and her daughter at bedside. Daughter reports that patient has been complaining of double vision; RN notified. They report that recent intake has been poor due to headaches and blood pressure medicine causing nausea. She likes the Ensure and thinks she should be drinking it at home. Nutrition-Focused physical exam completed. Findings are no fat depletion, severe muscle depletion, and no edema. Severe muscle depletion in her lower extremities is related to paresthesias 2/2 spinal cord ischemia.  No significant weight changes in the past 7 months.  12% weight loss within 6.5 months last year from March to October. Labs reviewed: potassium 5.4 (H) Medications reviewed and include MVI.  Diet Order:  Diet Heart Room service appropriate? Yes; Fluid consistency: Thin  Skin:  Reviewed, no issues  Last BM:  5/7  Height:   Ht Readings from Last 1 Encounters:  03/29/17 5\' 2"  (1.575 m)    Weight:   Wt Readings from Last 1 Encounters:  03/31/17 118 lb 1.6 oz (53.6 kg)    Ideal Body  Weight:  50 kg  BMI:  Body mass index is 21.6 kg/m.  Estimated Nutritional Needs:   Kcal:  1400-1600  Protein:  65-75 gm  Fluid:  1.5 L  EDUCATION NEEDS:   No education needs identified at this time  Joaquin CourtsKimberly Carr Shartzer, RD, LDN, CNSC Pager [phone] After Hours Pager [phone]

## 2017-03-31 NOTE — Plan of Care (Signed)
Problem: Safety: Goal: Ability to remain free from injury will improve Outcome: Progressing Patient is wheel chair bound, capable of transfering from bed to chair with stand by assist.

## 2017-03-31 NOTE — Progress Notes (Signed)
PROGRESS NOTE    Charlotte [name]  ZOX:[id number] DOB: 1966/05/08 DOA: 03/29/2017 [facility]: [facility]   Outpatient Specialists:     Brief Narrative:  Charlotte [name] is a 51 y.o. 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 At baseline the patient is wheelchair-bound and lives alone with in-home aides that help assist with daily needs. Patient notes that she had been outdoors all day and had not been feeling well complaining of a headache around 1:30 PM. She thought that her blood pressure may be elevated and therefore her daughter fixed her some food including fried chicken, french fries, and a soda. Her daughter pain gave her 100 mg of hydralazine which the patient usually does not take as she feels that it gives her a headache. Approximately 10 minutes after eating patient noted acute onset of recurrent palpitations with racing heart feeling and burning chest pain across her entire chest. Patient rates pain a 10/10 on the pain scale. Patient actually notes that the pain has come and gone intermittently since her birthday last month. Last episode approximately 1-1/2 week ago. She has tried nothing to relieve the pain. Associated symptoms include social stressors, inability to belch, shortness of breath, and fatigue. Denies any nausea, vomiting, diaphoresis, new focal weakness, or changes in vision. Patient still reports smoking [place] to 1 pack cigarettes per day on average especially when stressed and drinks alcohol approximately 2-3 times per week on average. Patient was last followed up with Dr. Eldridge Dace of cardiology as an outpatient.  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  -with CKD, would be high risk to cath  Cocaine positive -counseled and asked social worker to see.  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 Patient is wheelchair bound - Continue baclofen and gabapentin    H/O CVA (cerebrovascular accident) with residual left-sided deficits: Stable. - Continue aspirin   CKD stage 3:  -follow with daily BMPs  Hyperlipidemia  - Continue atorvastatin  Esophageal reflux - Continue Pepcid twice a day   Tobacco abuse: Patient reports smoking less than 1 pack cigarettes per day on average - Nicotine patch  DVT prophylaxis:  Lovenox   Code Status: Full Code   Family Communication: daughter  Disposition Plan: TBD  Consultants:   cards  Subjective: No chest pain, No SOB, seen prior to stress test  Objective: Vitals:   03/31/17 1015 03/31/17 1020 03/31/17 1024 03/31/17 1025  BP: (!) 164/92 (!) 155/96 126/88 (!) 154/87  Pulse:      Resp:      Temp:      TempSrc:      SpO2:      Weight:      Height:  Intake/Output Summary (Last 24 hours) at 03/31/17 1432 Last data filed at 03/31/17 1307  Gross per 24 hour  Intake              717 ml  Output              650 ml  Net               67 ml   Filed Weights   03/29/17 2300 03/30/17 0522 03/31/17 0340  Weight: 53.8 kg (118 lb 8 oz) 51.5 kg (113 lb 8 oz) 53.6 kg (118 lb 1.6 oz)    Examination:  General  exam: Appears calm and comfortable  Respiratory system: no increased WOB.  no wheezing heard.  Cardiovascular system: rrr Gastrointestinal system: Abdomen is nondistended, soft and nontender. No organomegaly or masses felt. Normal bowel sounds heard. Central nervous system: poor muscle tone in b/l LE .  Data Reviewed: I have personally reviewed following labs and imaging studies  CBC:  Recent Labs Lab 03/29/17 1742 03/30/17 0226  WBC 6.1 4.6  NEUTROABS  --  2.5  HGB 13.0 11.1*  HCT 39.7 34.4*  MCV 87.6 88.2  PLT 225 193   Basic Metabolic Panel:  Recent Labs Lab 03/29/17 1742 03/30/17 0226 03/30/17 0601 03/31/17 1201  NA 141 141  --  140  K 3.9 3.0*  --  5.4*  CL 110 110  --  111  CO2 19* 21*  --  18*  GLUCOSE 100* 104*  --  110*  BUN 30* 34*  --  25*  CREATININE 2.12* 2.18*  --  2.10*  CALCIUM 9.3 8.4*  --  9.1  MG  --   --  2.1  --    GFR: Estimated Creatinine Clearance: 24.5 mL/min (A) (by C-G formula based on SCr of 2.1 mg/dL (H)). Liver Function Tests: No results for input(s): AST, ALT, ALKPHOS, BILITOT, PROT, ALBUMIN in the last 168 hours. No results for input(s): LIPASE, AMYLASE in the last 168 hours. No results for input(s): AMMONIA in the last 168 hours. Coagulation Profile: No results for input(s): INR, PROTIME in the last 168 hours. Cardiac Enzymes:  Recent Labs Lab 03/29/17 2227 03/30/17 0226 03/30/17 0601 03/30/17 1022 03/30/17 1717  TROPONINI 0.04* 0.05* 0.04* 0.03* 0.04*   BNP (last 3 results) No results for input(s): PROBNP in the last 8760 hours. HbA1C: No results for input(s): HGBA1C in the last 72 hours. CBG:  Recent Labs Lab 03/30/17 0759 03/30/17 1132  GLUCAP 90 120*   Lipid Profile:  Recent Labs  03/30/17 0226  CHOL 185  HDL 74  LDLCALC 94  TRIG 84  CHOLHDL 2.5   Thyroid Function Tests: No results for input(s): TSH, T4TOTAL, FREET4, T3FREE, THYROIDAB in the last 72 hours. Anemia Panel: No results for input(s):  VITAMINB12, FOLATE, FERRITIN, TIBC, IRON, RETICCTPCT in the last 72 hours. Urine analysis:    Component Value Date/Time   COLORURINE YELLOW 03/30/2017 0556   APPEARANCEUR HAZY (A) 03/30/2017 0556   LABSPEC 1.010 03/30/2017 0556   PHURINE 5.0 03/30/2017 0556   GLUCOSEU NEGATIVE 03/30/2017 0556   HGBUR MODERATE (A) 03/30/2017 0556   BILIRUBINUR NEGATIVE 03/30/2017 0556   KETONESUR NEGATIVE 03/30/2017 0556   PROTEINUR 100 (A) 03/30/2017 0556   UROBILINOGEN 0.2 01/18/2015 1602   NITRITE NEGATIVE 03/30/2017 0556   LEUKOCYTESUR TRACE (A) 03/30/2017 0556   )No results found for this or any previous visit (from the past 240 hour(s)).    Anti-infectives  None     Radiology Studies: Dg Chest 2 View  Result Date: 03/29/2017 CLINICAL DATA:  Acute onset of generalized chest pain and shortness of breath. Headache and tachycardia. Initial encounter. EXAM: CHEST  2 VIEW COMPARISON:  Chest radiograph performed 02/12/2016, and CTA of the chest performed 08/31/2016 FINDINGS: The lungs are well-aerated. There is no evidence of focal opacification, pleural effusion or pneumothorax. The heart is normal in size; a thoracic aortic stent graft is noted. No acute osseous abnormalities are seen. IMPRESSION: No acute cardiopulmonary process seen. Electronically Signed   By: Roanna RaiderJeffery  Chang M.D.   On: 03/29/2017 18:25   Scheduled Meds: . amLODipine  10 mg Oral Daily  . aspirin EC  81 mg Oral Daily  . atorvastatin  20 mg Oral Daily  . enoxaparin (LOVENOX) injection  30 mg Subcutaneous Q24H  . famotidine  20 mg Oral BID  . feeding supplement (ENSURE ENLIVE)  237 mL Oral BID BM  . gabapentin  300 mg Oral TID  . labetalol  300 mg Oral BID  . methocarbamol  500 mg Oral TID  . multivitamin with minerals  1 tablet Oral Daily  . nicotine  21 mg Transdermal Daily  . ondansetron      . traZODone  50 mg Oral QHS   Continuous Infusions:   LOS: 1 day   Time spent: 25 min  Standley Dakinslanford Cailah Reach, MD [facility] Pager [phone]  If 7PM-7AM, please contact night-coverage [email or web address] Password [email or web address] 03/31/2017, 2:32 PM

## 2017-03-31 NOTE — Progress Notes (Signed)
Myovue reviewed No frank ischemia More likely diaphragmatic attenuation EF normal by echo Atypical pain with comorbidities and elevated Cr Would not proceed with cath or any invasive procedure At this time Continue medical Rx   Charlton HawsPeter Kullen Tomasetti

## 2017-03-31 NOTE — Progress Notes (Signed)
Progress Note  Patient Name: Charlotte RobertHelene J [name] Date of Encounter: 03/31/2017  Primary Cardiologist: Eldridge DaceVaranasi  Subjective   SSCP continues this am seen prior to going for nuclear study  Inpatient Medications    Scheduled Meds: . amLODipine  10 mg Oral Daily  . aspirin EC  81 mg Oral Daily  . atorvastatin  20 mg Oral Daily  . enoxaparin (LOVENOX) injection  30 mg Subcutaneous Q24H  . famotidine  20 mg Oral BID  . feeding supplement (ENSURE ENLIVE)  237 mL Oral BID BM  . gabapentin  300 mg Oral TID  . labetalol  300 mg Oral BID  . methocarbamol  500 mg Oral TID  . multivitamin with minerals  1 tablet Oral Daily  . nicotine  21 mg Transdermal Daily  . regadenoson  0.4 mg Intravenous Once  . traZODone  50 mg Oral QHS   Continuous Infusions:  PRN Meds: acetaminophen, baclofen, gi cocktail, hydrALAZINE, ipratropium-albuterol, morphine injection, nitroGLYCERIN, ondansetron (ZOFRAN) IV, traMADol   Vital Signs    Vitals:   03/30/17 1947 03/30/17 2054 03/31/17 0027 03/31/17 0340  BP: (!) 168/94 (!) 162/95 132/72 (!) 158/94  Pulse: 92 88 73 68  Resp: 20  20 14   Temp: 98.4 F (36.9 C)   97.4 F (36.3 C)  TempSrc: Oral   Oral  SpO2: 100%  96% 97%  Weight:    118 lb 1.6 oz (53.6 kg)  Height:        Intake/Output Summary (Last 24 hours) at 03/31/17 0835 Last data filed at 03/31/17 [id number]  Gross per 24 hour  Intake              477 ml  Output              650 ml  Net             -173 ml   Filed Weights   03/29/17 2300 03/30/17 0522 03/31/17 0340  Weight: 118 lb 8 oz (53.8 kg) 113 lb 8 oz (51.5 kg) 118 lb 1.6 oz (53.6 kg)    Telemetry    NSR 03/31/2017  - Personally Reviewed  ECG    SR rate 80 LVH with strain  - Personally Reviewed  Physical Exam   Affect appropriate Chronically ill black female  HEENT: normal Neck supple with no adenopathy JVP normal no bruits no thyromegaly Lungs clear with no wheezing and good diaphragmatic motion Heart:  S1/S2 no murmur, no  rub, gallop or click PMI normal Abdomen: benighn, BS positve, no tenderness, no AAA no bruit.  No HSM or HJR Distal pulses intact with no bruits No edema Neuro left sided weakness from old CVA  Skin warm and dry Bilateral LE weakness   Labs    Chemistry Recent Labs Lab 03/29/17 1742 03/30/17 0226  NA 141 141  K 3.9 3.0*  CL 110 110  CO2 19* 21*  GLUCOSE 100* 104*  BUN 30* 34*  CREATININE 2.12* 2.18*  CALCIUM 9.3 8.4*  GFRNONAA 25* 25*  GFRAA 30* 29*  ANIONGAP 12 10     Hematology Recent Labs Lab 03/29/17 1742 03/30/17 0226  WBC 6.1 4.6  RBC 4.53 3.90  HGB 13.0 11.1*  HCT 39.7 34.4*  MCV 87.6 88.2  MCH 28.7 28.5  MCHC 32.7 32.3  RDW 14.1 14.2  PLT 225 193    Cardiac Enzymes Recent Labs Lab 03/30/17 0226 03/30/17 0601 03/30/17 1022 03/30/17 1717  TROPONINI 0.05* 0.04* 0.03* 0.04*  Recent Labs Lab 03/29/17 1751  TROPIPOC 0.03     BNPNo results for input(s): BNP, PROBNP in the last 168 hours.   DDimer No results for input(s): DDIMER in the last 168 hours.   Radiology    Dg Chest 2 View  Result Date: 03/29/2017 CLINICAL DATA:  Acute onset of generalized chest pain and shortness of breath. Headache and tachycardia. Initial encounter. EXAM: CHEST  2 VIEW COMPARISON:  Chest radiograph performed 02/12/2016, and CTA of the chest performed 08/31/2016 FINDINGS: The lungs are well-aerated. There is no evidence of focal opacification, pleural effusion or pneumothorax. The heart is normal in size; a thoracic aortic stent graft is noted. No acute osseous abnormalities are seen. IMPRESSION: No acute cardiopulmonary process seen. Electronically Signed   By: Roanna Raider M.D.   On: 03/29/2017 18:25    Cardiac Studies   Echo :  EF 60-65% mild LVH no valve disease  Patient Profile     51 y.o. female  With atypical chest pain echo normal with no RWMA;s r/o ECG with LVH and strain for myovue this am  Assessment & Plan    1) Chest pain r/o EF normal by  echo myovue this am lexiscan as patient is essentially non ambulatory from stroke and LE ischemia form AAA 2. HTN  Well controlled.  Continue current medications and low sodium Dash type diet.   3. CRF:  Would avoid heart cath if at all possible given poor functional status and elevated Cr Lab Results  Component Value Date   CREATININE 2.18 (H) 03/30/2017   BUN 34 (H) 03/30/2017   NA 141 03/30/2017   K 3.0 (L) 03/30/2017   CL 110 03/30/2017   CO2 21 (L) 03/30/2017   4. Aortic Stent Graft:  Thoracic normal with no residual aneurysm on CT 08/31/16  Signed, Charlton Haws, MD  03/31/2017, 8:35 AM

## 2017-04-01 ENCOUNTER — Encounter (HOSPITAL_COMMUNITY): Payer: Self-pay | Admitting: Family Medicine

## 2017-04-01 LAB — BASIC METABOLIC PANEL
ANION GAP: 6 (ref 5–15)
BUN: 28 mg/dL — AB (ref 6–20)
CALCIUM: 8.5 mg/dL — AB (ref 8.9–10.3)
CO2: 22 mmol/L (ref 22–32)
CREATININE: 2.27 mg/dL — AB (ref 0.44–1.00)
Chloride: 110 mmol/L (ref 101–111)
GFR calc Af Amer: 27 mL/min — ABNORMAL LOW (ref >60)
GFR, EST NON AFRICAN AMERICAN: 23 mL/min — AB (ref >60)
GLUCOSE: 105 mg/dL — AB (ref 65–99)
Potassium: 3.9 mmol/L (ref 3.5–5.1)
Sodium: 138 mmol/L (ref 135–145)

## 2017-04-01 MED ORDER — NITROGLYCERIN 0.4 MG SL SUBL: 0.4000 mg | SUBLINGUAL_TABLET | SUBLINGUAL | 0 refills | Status: AC | PRN

## 2017-04-01 MED ORDER — ENSURE ENLIVE PO LIQD: 237.0000 mL | Freq: Two times a day (BID) | ORAL | 0 refills | Status: AC

## 2017-04-01 NOTE — Care Management Note (Signed)
Case Management Note  Patient Details  Name: Charlotte [name] MRN: [id number] Date of Birth: 08/25/1966  Subjective/Objective:                 Spoke with patient at the bedside. She states she has a HHA 3 hours a day MWF. She states she has a WC and a power WC. She states that [facility] her prescriptions other than her percocet for which her HHA drives her to the pharmacy to pick up. She denies needs at home.  PCP Dr Edgar FriskAvall Coverage Medicaid   Action/Plan:  No CM needs, will DC to home today. Expected Discharge Date:  04/01/17               Expected Discharge Plan:  Home/Self Care  In-House Referral:     Discharge planning Services  CM Consult  Post Acute Care Choice:    Choice offered to:     DME Arranged:    DME Agency:     HH Arranged:    HH Agency:     Status of Service:  Completed, signed off  If discussed at [employer] Length of Stay Meetings, dates discussed:    Additional Comments:  Lawerance SabalDebbie Carman Essick, RN 04/01/2017, 2:25 PM

## 2017-04-01 NOTE — Discharge Summary (Signed)
Physician Discharge Summary  [name]:[id number] DOB: [birth year] DOA: 03/29/2017  PCP: [facility]  Admit date: 03/29/2017 Discharge date: 04/01/2017  Admitted From: Home  Disposition:  Home   Recommendations for Outpatient Follow-up:  1. Follow up with PCP in 1 weeks 2. Follow up with cardiologist in 2 weeks 3. 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 At baseline the patient is wheelchair-bound and lives alone with in-home aides that help assist with daily needs. Patient notes that she had been outdoors all day and had not been feeling well complaining of a headache around 1:30 PM. She thought that her blood pressure may be elevated and therefore her daughter fixed her some food including fried chicken, french fries, and a soda. Her daughter pain gave her 100 mg of hydralazine which the patient usually does not take as she feels that it gives her a headache. Approximately 10 minutes after eating patient noted acute onset of recurrent palpitations with racing heart feeling and burning chest pain across her entire chest. Patient rates pain a 10/10 on the pain scale. Patient actually notes that the pain has come and gone intermittently since her birthday last month. Last episode approximately 1-1/2week ago. She has tried nothing to relieve the pain. Associated symptoms include social stressors, inability to belch, shortness of breath, and fatigue. Denies any nausea, vomiting, diaphoresis, new focal weakness, or changes in  vision. Patient still reports smoking [place] to 1 pack cigarettes per day on average especially when stressed and drinks alcohol approximately 2-3 times per week on average. Patient was last followed up with Dr. Eldridge Dace of cardiology as an outpatient.  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  -with CKD, would be high risk to cath per cardiology team.   Cocaine positive -counseled and asked social worker to provide treatment options  Hypertensive urgency: -better controlled,Well controlled.  Continue current medications and low sodium Dash type diet per cardiology team.   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 Patient is wheelchair bound - Continue baclofen and gabapentin   H/O CVA (cerebrovascular accident) with residual left-sided deficits: Stable. 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)   Chronic renal insufficiency,  stage III (moderate)   Dyslipidemia   Esophageal reflux   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   H/O: CVA (cerebrovascular accident)   Hypertensive urgency   Hypertension  Discharge Instructions  Discharge Instructions    Diet - low sodium heart healthy    Complete by:  As directed    Increase activity slowly    Complete by:  As directed      Allergies as of 04/01/2017      Reactions   Seroquel [quetiapine] Other (See Comments)   Night sweats, dizziness and possible confusion   Olanzapine Nausea And Vomiting      Medication List    STOP taking these medications   hydrALAZINE 100 MG tablet Commonly known as:  APRESOLINE   isosorbide mononitrate 30 MG 24 hr tablet Commonly known as:  IMDUR     TAKE these medications   amLODipine 10 MG tablet Commonly known as:  NORVASC Take 1 tablet (10 mg total) by mouth daily.   aspirin EC 81 MG tablet Take 1 tablet (81 mg total) by mouth daily.   atorvastatin 20 MG tablet Commonly known as:  LIPITOR Take 20 mg by mouth daily.   baclofen 10 MG tablet Commonly known as:  LIORESAL Take 10 mg by mouth 3 (three) times daily as needed for muscle spasms.   famotidine 20 MG tablet Commonly known as:  PEPCID Take 20 mg by mouth 2 (two) times daily.   feeding supplement (ENSURE ENLIVE) Liqd Take 237 mLs by mouth 2 (two) times daily between meals.   gabapentin 300 MG capsule Commonly known as:  NEURONTIN Take 300 mg by mouth 3 (three) times daily.   labetalol 300 MG tablet Commonly known as:  NORMODYNE Take 1 tablet (300 mg total) by mouth 2 (two) times daily.   methocarbamol 500 MG tablet Commonly known as:  ROBAXIN Take 500 mg by mouth 3 (three) times daily.   multivitamin with minerals Tabs tablet Take 1 tablet by mouth daily.   nitroGLYCERIN 0.4 MG SL tablet Commonly known as:  NITROSTAT Place 1 tablet (0.4 mg total) under the tongue every 5 (five) minutes as needed for chest pain.    traMADol 50 MG tablet Commonly known as:  ULTRAM Take 50 mg by mouth every 6 (six) hours as needed.   traZODone 50 MG tablet Commonly known as:  DESYREL Take 50 mg by mouth at bedtime.      Follow-up Information    [facility]. Schedule an appointment as soon as possible for a visit in 1 week(s).   Specialty:  Internal Medicine Why:  Hospital Follow Up  Contact information: [address] [place] [address] [phone]        Corky Crafts, MD. Schedule an appointment as soon as possible for a visit in 2 week(s).   Specialties:  Cardiology, Radiology, Interventional Cardiology Why:  Hospital Follow Up  Contact information: [address] [place] [address] [phone]          Allergies  Allergen Reactions  . Seroquel [Quetiapine] Other (See Comments)    Night sweats, dizziness and possible confusion  . Olanzapine Nausea And Vomiting    Procedures/Studies: Dg Chest 2 View  Result Date: 03/29/2017 CLINICAL DATA:  Acute onset of generalized chest pain and shortness of breath. Headache and tachycardia. Initial encounter.  EXAM: CHEST  2 VIEW COMPARISON:  Chest radiograph performed 02/12/2016, and CTA of the chest performed 08/31/2016 FINDINGS: The lungs are well-aerated. There is no evidence of focal opacification, pleural effusion or pneumothorax. The heart is normal in size; a thoracic aortic stent graft is noted. No acute osseous abnormalities are seen. IMPRESSION: No acute cardiopulmonary process seen. Electronically Signed   By: Roanna RaiderJeffery  Chang M.D.   On: 03/29/2017 18:25   Ct Head Wo Contrast  Result Date: 03/31/2017 CLINICAL DATA:  Acute onset of generalized headaches and diplopia. Initial encounter. EXAM: CT HEAD WITHOUT CONTRAST TECHNIQUE: Contiguous axial images were obtained from the base of the skull through the vertex without intravenous contrast. COMPARISON:  None. FINDINGS: Brain: No evidence of acute infarction,  hemorrhage, hydrocephalus, extra-axial collection or mass lesion/mass effect. Prominence of the ventricles and sulci reflects mild cortical volume loss. Mild cerebellar atrophy is noted. Scattered periventricular and subcortical white matter change likely reflects small vessel ischemic microangiopathy. Chronic lacunar infarcts are seen at the basal ganglia bilaterally. The brainstem and fourth ventricle are within normal limits. The basal ganglia are unremarkable in appearance. The cerebral hemispheres demonstrate grossly normal gray-white differentiation. No mass effect or midline shift is seen. Vascular: No hyperdense vessel or unexpected calcification. Skull: There is no evidence of fracture; visualized osseous structures are unremarkable in appearance. Sinuses/Orbits: The orbits are within normal limits. A mucus retention cyst or polyp is noted at the right side of the sphenoid sinus. The remaining paranasal sinuses and mastoid air cells are well-aerated. Other: No significant soft tissue abnormalities are seen. IMPRESSION: 1. No acute intracranial pathology seen on CT. 2. Mild cortical volume loss and scattered small vessel ischemic microangiopathy. 3. Chronic lacunar infarcts at the basal ganglia bilaterally. 4. Mucus retention cyst or polyp at the right side of the sphenoid sinus. Electronically Signed   By: Roanna RaiderJeffery  Chang M.D.   On: 03/31/2017 22:40   Nm Myocar Multi W/spect W/wall Motion / Ef  Result Date: 03/31/2017  No changes from baseline EKG with LVH and repolarization changes  There is a medium defect of moderate severity present in the basal inferior, basal inferolateral, mid inferior and mid inferolateral location. The defect is partially reversible and could represent variations in diaphragmatic attenuation artifact and increased gut uptake given normal LVF.  This is an intermediate risk study.  The left ventricular ejection fraction is mildly decreased (45-54%).    (Echo, Carotid, EGD,  Colonoscopy, ERCP)    Subjective: Pt without complaints today. No more headaches.   Discharge Exam: Vitals:   04/01/17 1000 04/01/17 1300  BP: (!) 143/87 (!) 155/96  Pulse: 66 69  Resp:  16  Temp:  97.7 F (36.5 C)   Vitals:   04/01/17 0449 04/01/17 0837 04/01/17 1000 04/01/17 1300  BP: (!) 148/87 (!) 166/97 (!) 143/87 (!) 155/96  Pulse: 65 66 66 69  Resp:  16  16  Temp: 98 F (36.7 C) 97.5 F (36.4 C)  97.7 F (36.5 C)  TempSrc: Oral Oral  Oral  SpO2: 97% 100%  97%  Weight: 53.5 kg (118 lb)     Height:        General exam: Appears calm and comfortable  Respiratory system: no increased WOB.  no wheezing heard.  Cardiovascular system: rrr normal s1 s2 sounds.  Gastrointestinal system: Abdomen is nondistended, soft and nontender. No organomegaly or masses felt. Normal bowel sounds heard. Central nervous system: poor muscle tone in b/l LE  The results of significant diagnostics  from this hospitalization (including imaging, microbiology, ancillary and laboratory) are listed below for reference.     Microbiology: No results found for this or any previous visit (from the past 240 hour(s)).   Labs: BNP (last 3 results) No results for input(s): BNP in the last 8760 hours. Basic Metabolic Panel:  Recent Labs Lab 03/29/17 1742 03/30/17 0226 03/30/17 0601 03/31/17 1201 04/01/17 0407  NA 141 141  --  140 138  K 3.9 3.0*  --  5.4* 3.9  CL 110 110  --  111 110  CO2 19* 21*  --  18* 22  GLUCOSE 100* 104*  --  110* 105*  BUN 30* 34*  --  25* 28*  CREATININE 2.12* 2.18*  --  2.10* 2.27*  CALCIUM 9.3 8.4*  --  9.1 8.5*  MG  --   --  2.1  --   --    Liver Function Tests: No results for input(s): AST, ALT, ALKPHOS, BILITOT, PROT, ALBUMIN in the last 168 hours. No results for input(s): LIPASE, AMYLASE in the last 168 hours. No results for input(s): AMMONIA in the last 168 hours. CBC:  Recent Labs Lab 03/29/17 1742 03/30/17 0226  WBC 6.1 4.6  NEUTROABS  --  2.5   HGB 13.0 11.1*  HCT 39.7 34.4*  MCV 87.6 88.2  PLT 225 193   Cardiac Enzymes:  Recent Labs Lab 03/29/17 2227 03/30/17 0226 03/30/17 0601 03/30/17 1022 03/30/17 1717  TROPONINI 0.04* 0.05* 0.04* 0.03* 0.04*   BNP: Invalid input(s): POCBNP CBG:  Recent Labs Lab 03/30/17 0759 03/30/17 1132  GLUCAP 90 120*   D-Dimer No results for input(s): DDIMER in the last 72 hours. Hgb A1c No results for input(s): HGBA1C in the last 72 hours. Lipid Profile  Recent Labs  03/30/17 0226  CHOL 185  HDL 74  LDLCALC 94  TRIG 84  CHOLHDL 2.5   Thyroid function studies No results for input(s): TSH, T4TOTAL, T3FREE, THYROIDAB in the last 72 hours.  Invalid input(s): FREET3 Anemia work up No results for input(s): VITAMINB12, FOLATE, FERRITIN, TIBC, IRON, RETICCTPCT in the last 72 hours. Urinalysis    Component Value Date/Time   COLORURINE YELLOW 03/30/2017 0556   APPEARANCEUR HAZY (A) 03/30/2017 0556   LABSPEC 1.010 03/30/2017 0556   PHURINE 5.0 03/30/2017 0556   GLUCOSEU NEGATIVE 03/30/2017 0556   HGBUR MODERATE (A) 03/30/2017 0556   BILIRUBINUR NEGATIVE 03/30/2017 0556   KETONESUR NEGATIVE 03/30/2017 0556   PROTEINUR 100 (A) 03/30/2017 0556   UROBILINOGEN 0.2 01/18/2015 1602   NITRITE NEGATIVE 03/30/2017 0556   LEUKOCYTESUR TRACE (A) 03/30/2017 0556   Sepsis Labs Invalid input(s): PROCALCITONIN,  WBC,  LACTICIDVEN Microbiology No results found for this or any previous visit (from the past 240 hour(s)).  Time coordinating discharge: 31 minutes  SIGNED:  Standley Dakins, MD  [facility] 04/01/2017, 1:40 PM Pager [phone]  If 7PM-7AM, please contact night-coverage [email or web address] Password [email or web address]

## 2017-04-01 NOTE — Progress Notes (Signed)
Progress Note  Patient Name: Charlotte [name] Date of Encounter: 04/01/2017  Primary Cardiologist: Eldridge Dace  Subjective   No chest pain Headache and diplopia better asking for percocet for leg pain  Inpatient Medications    Scheduled Meds: . amLODipine  10 mg Oral Daily  . aspirin EC  81 mg Oral Daily  . atorvastatin  20 mg Oral Daily  . enoxaparin (LOVENOX) injection  30 mg Subcutaneous Q24H  . famotidine  20 mg Oral BID  . feeding supplement (ENSURE ENLIVE)  237 mL Oral BID BM  . gabapentin  300 mg Oral TID  . labetalol  300 mg Oral BID  . methocarbamol  500 mg Oral TID  . multivitamin with minerals  1 tablet Oral Daily  . nicotine  21 mg Transdermal Daily  . traZODone  50 mg Oral QHS   Continuous Infusions:  PRN Meds: acetaminophen, baclofen, gi cocktail, hydrALAZINE, ipratropium-albuterol, LORazepam, morphine injection, nitroGLYCERIN, ondansetron (ZOFRAN) IV, traMADol   Vital Signs    Vitals:   04/01/17 0033 04/01/17 0200 04/01/17 0449 04/01/17 0837  BP: 127/69 108/73 (!) 148/87 (!) 166/97  Pulse: 79 67 65 66  Resp:    16  Temp:   98 F (36.7 C) 97.5 F (36.4 C)  TempSrc:   Oral Oral  SpO2: 96% 96% 97% 100%  Weight:   118 lb (53.5 kg)   Height:        Intake/Output Summary (Last 24 hours) at 04/01/17 0850 Last data filed at 04/01/17 1610  Gross per 24 hour  Intake             1080 ml  Output             1350 ml  Net             -270 ml   Filed Weights   03/30/17 0522 03/31/17 0340 04/01/17 0449  Weight: 113 lb 8 oz (51.5 kg) 118 lb 1.6 oz (53.6 kg) 118 lb (53.5 kg)    Telemetry    NSR 04/01/2017  - Personally Reviewed  ECG    SR rate 80 LVH with strain  - Personally Reviewed  Physical Exam   Affect appropriate Overweight black female  HEENT: normal Neck supple with no adenopathy JVP normal no bruits no thyromegaly Lungs clear with no wheezing and good diaphragmatic motion Heart:  S1/S2 no murmur, no rub, gallop or click PMI  normal Abdomen: benighn, BS positve, no tenderness, no AAA no bruit.  No HSM or HJR Distal pulses intact with no bruits No edema Neuro left sided and bilateral LE weakness from previous CVA  Skin warm and dry No muscular weakness   Labs    Chemistry  Recent Labs Lab 03/30/17 0226 03/31/17 1201 04/01/17 0407  NA 141 140 138  K 3.0* 5.4* 3.9  CL 110 111 110  CO2 21* 18* 22  GLUCOSE 104* 110* 105*  BUN 34* 25* 28*  CREATININE 2.18* 2.10* 2.27*  CALCIUM 8.4* 9.1 8.5*  GFRNONAA 25* 26* 23*  GFRAA 29* 30* 27*  ANIONGAP 10 11 6      Hematology  Recent Labs Lab 03/29/17 1742 03/30/17 0226  WBC 6.1 4.6  RBC 4.53 3.90  HGB 13.0 11.1*  HCT 39.7 34.4*  MCV 87.6 88.2  MCH 28.7 28.5  MCHC 32.7 32.3  RDW 14.1 14.2  PLT 225 193    Cardiac Enzymes  Recent Labs Lab 03/30/17 0226 03/30/17 0601 03/30/17 1022 03/30/17 1717  TROPONINI 0.05*  0.04* 0.03* 0.04*     Recent Labs Lab 03/29/17 1751  TROPIPOC 0.03     BNPNo results for input(s): BNP, PROBNP in the last 168 hours.   DDimer No results for input(s): DDIMER in the last 168 hours.   Radiology    Ct Head Wo Contrast  Result Date: 03/31/2017 CLINICAL DATA:  Acute onset of generalized headaches and diplopia. Initial encounter. EXAM: CT HEAD WITHOUT CONTRAST TECHNIQUE: Contiguous axial images were obtained from the base of the skull through the vertex without intravenous contrast. COMPARISON:  None. FINDINGS: Brain: No evidence of acute infarction, hemorrhage, hydrocephalus, extra-axial collection or mass lesion/mass effect. Prominence of the ventricles and sulci reflects mild cortical volume loss. Mild cerebellar atrophy is noted. Scattered periventricular and subcortical white matter change likely reflects small vessel ischemic microangiopathy. Chronic lacunar infarcts are seen at the basal ganglia bilaterally. The brainstem and fourth ventricle are within normal limits. The basal ganglia are unremarkable in  appearance. The cerebral hemispheres demonstrate grossly normal gray-white differentiation. No mass effect or midline shift is seen. Vascular: No hyperdense vessel or unexpected calcification. Skull: There is no evidence of fracture; visualized osseous structures are unremarkable in appearance. Sinuses/Orbits: The orbits are within normal limits. A mucus retention cyst or polyp is noted at the right side of the sphenoid sinus. The remaining paranasal sinuses and mastoid air cells are well-aerated. Other: No significant soft tissue abnormalities are seen. IMPRESSION: 1. No acute intracranial pathology seen on CT. 2. Mild cortical volume loss and scattered small vessel ischemic microangiopathy. 3. Chronic lacunar infarcts at the basal ganglia bilaterally. 4. Mucus retention cyst or polyp at the right side of the sphenoid sinus. Electronically Signed   By: Roanna RaiderJeffery  Chang M.D.   On: 03/31/2017 22:40   Nm Myocar Multi W/spect W/wall Motion / Ef  Result Date: 03/31/2017  No changes from baseline EKG with LVH and repolarization changes  There is a medium defect of moderate severity present in the basal inferior, basal inferolateral, mid inferior and mid inferolateral location. The defect is partially reversible and could represent variations in diaphragmatic attenuation artifact and increased gut uptake given normal LVF.  This is an intermediate risk study.  The left ventricular ejection fraction is mildly decreased (45-54%).     Cardiac Studies   Echo :  EF 60-65% mild LVH no valve disease  Patient Profile     51 y.o. female  With atypical chest pain echo normal with no RWMA;s r/o ECG with LVH and strain for myovue non ischemic   Assessment & Plan    1) Chest pain r/o EF normal by echo myovue diaphragmatic attenuation no ischemia no indication for cath  2. HTN  Well controlled.  Continue current medications and low sodium Dash type diet.   3. CRF:  Would avoid heart cath if at all possible given  poor functional status and elevated Cr Lab Results  Component Value Date   CREATININE 2.27 (H) 04/01/2017   BUN 28 (H) 04/01/2017   NA 138 04/01/2017   K 3.9 04/01/2017   CL 110 04/01/2017   CO2 22 04/01/2017   4. Aortic Stent Graft:  Thoracic normal with no residual aneurysm on CT 08/31/16  Ok to d/c home form our standpoint   Signed, Charlton HawsPeter Yessika Otte, MD  04/01/2017, 8:50 AM

## 2017-04-01 NOTE — Discharge Instructions (Signed)
Nonspecific Chest Pain Chest pain can be caused by many different conditions. There is a chance that your pain could be related to something serious, such as a heart attack or a blood clot in your lungs. Chest pain can also be caused by conditions that are not life-threatening. If you have chest pain, it is very important to follow up with your doctor. Follow these instructions at home: Medicines   If you were prescribed an antibiotic medicine, take it as told by your doctor. Do not stop taking the antibiotic even if you start to feel better.  Take over-the-counter and prescription medicines only as told by your doctor. Lifestyle   Do not use any products that contain nicotine or tobacco, such as cigarettes and e-cigarettes. If you need help quitting, ask your doctor.  Do not drink alcohol.  Make lifestyle changes as told by your doctor. These may include:  Getting regular exercise. Ask your doctor for some activities that are safe for you.  Eating a heart-healthy diet. A diet specialist (dietitian) can help you to learn healthy eating options.  Staying at a healthy weight.  Managing diabetes, if needed.  Lowering your stress, as with deep breathing or spending time in nature. General instructions   Avoid any activities that make you feel chest pain.  If your chest pain is because of heartburn:  Raise (elevate) the head of your bed about 6 inches (15 cm). You can do this by putting blocks under the bed legs at the head of the bed.  Do not sleep with extra pillows under your head. That does not help heartburn.  Keep all follow-up visits as told by your doctor. This is important. This includes any further testing if your chest pain does not go away. Contact a doctor if:  Your chest pain does not go away.  You have a rash with blisters on your chest.  You have a fever.  You have chills. Get help right away if:  Your chest pain is worse.  You have a cough that gets worse,  or you cough up blood.  You have very bad (severe) pain in your belly (abdomen).  You are very weak.  You pass out (faint).  You have either of these for no clear reason:  Sudden chest discomfort.  Sudden discomfort in your arms, back, neck, or jaw.  You have shortness of breath at any time.  You suddenly start to sweat, or your skin gets clammy.  You feel sick to your stomach (nauseous).  You throw up (vomit).  You suddenly feel light-headed or dizzy.  Your heart starts to beat fast, or it feels like it is skipping beats. These symptoms may be an emergency. Do not wait to see if the symptoms will go away. Get medical help right away. Call your local emergency services (911 in the [place] Do not drive yourself to the hospital. This information is not intended to replace advice given to you by your health care provider. Make sure you discuss any questions you have with your health care provider. Document Released: 04/26/2008 Document Revised: 08/02/2016 Document Reviewed: 08/02/2016 Elsevier Interactive Patient Education  2017 [employer] Return if symptoms recur or new problems develop.

## 2017-04-01 NOTE — Progress Notes (Signed)
CSW was contacted my nurse stating that patient needs assistance with transportation's. Per RN patient is not ambulatory and will need PTAR to be called. CSW has arranged patient transport from hospital to patients residence. CSW signing off as patient no longer has needs.  Marrianne MoodAshley Brookelle Pellicane, MSW,  [employer] [phone]

## 2017-05-29 NOTE — Progress Notes (Deleted)
Cardiology Office Note   Date:  05/29/2017   ID:  MAGDELINE [name], DOB 23-Jan-1966, MRN [id number]  PCP:  [facility]    No chief complaint on file.    Wt Readings from Last 3 Encounters:  04/01/17 118 lb (53.5 kg)  09/03/16 117 lb (53.1 kg)  08/31/16 117 lb (53.1 kg)       History of Present Illness: Charlotte [name] is a 51 y.o. 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 She was discharged from, hospital on 07/25/14. 4 days later she went to [facility] or chest pain. She ruled out for myocardial infarction. She was discharged on 07/31/14.  She went back to come hospital on 08/02/14 to the emergency room and was evaluated and released.   In October 2015 she apparently had another hospital admission secondary to tachycardia and anxiety..   She is a former smoker. She stopped smoking when she had her aortic dissection and her stroke.  She is unable to stand unassisted. She travels by wheelchair. She has had difficulty maintaining her medical regimen because of the expense of the medicines.  BP control has been dificult in the past.     Past Medical History:  Diagnosis Date  . Anemia   . Anxiety   . Arthritis    "shoulders; hands" (01/16/2015)  . Bipolar disorder (HCC)   . Chronic lower back pain   . Complication of anesthesia    "alot of back pains from the epidurals"  . Daily headache   . Depression   . Dissecting aneurysm of thoracic aorta, [place] type B (HCC) 06/29/2014   Charlotte [name] 07/05/2014  . Family history of anesthesia complication    "alot of back pains from the epidurals"  . GERD (gastroesophageal reflux disease)   . Hypertension   . Migraine    "sometimes twice/day" (01/16/2015)  . 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 Polycystic kidney disease    /notes 07/05/2014  . Renal insufficiency   . Stroke (HCC) 06/29/2014   residual BLE weakness    Past Surgical History:  Procedure Laterality Date  . CESAREAN SECTION  1986  . SPINAL DRAIN PLACEMENT  06/29/2014  . SPINAL DRAIN REMOVAL  07/02/2014  . THORACENTESIS  07/05/2014   Charlotte [name] 07/05/2014  . THORACIC AORTIC ENDOVASCULAR STENT GRAFT N/A 08/23/2014   Procedure: ENDOVASCULAR THORACIC AORTIC  STENT GRAFT;  Surgeon: Sherren Kerns, MD;  Location: [facility] OR;  Service: Vascular;  Laterality: N/A;  . TUBAL LIGATION  1986     Current Outpatient Prescriptions  Medication Sig Dispense Refill  . amLODipine (NORVASC) 10 MG tablet Take 1 tablet (10 mg total) by mouth daily. 30 tablet 6  . aspirin EC 81 MG tablet Take 1 tablet (81 mg total) by mouth daily. (Patient not taking: Reported on 03/29/2017) 60 tablet 1  . atorvastatin (LIPITOR) 20 MG tablet Take 20 mg by mouth daily.    . baclofen (LIORESAL) 10 MG tablet Take 10 mg by mouth 3 (three) times daily as needed for muscle spasms.     . famotidine (PEPCID) 20 MG tablet Take 20 mg by mouth 2 (two) times daily.    [name] gabapentin (NEURONTIN) 300 MG capsule Take 300 mg by mouth 3 (  three) times daily.    [name]. labetalol (NORMODYNE) 300 MG tablet Take 1 tablet (300 mg total) by mouth 2 (two) times daily. 60 tablet 0  . methocarbamol (ROBAXIN) 500 MG tablet Take 500 mg by mouth 3 (three) times daily.    . Multiple Vitamin (MULTIVITAMIN WITH MINERALS) TABS tablet Take 1 tablet by mouth daily.    . nitroGLYCERIN (NITROSTAT) 0.4 MG SL tablet Place 1 tablet (0.4 mg total) under the tongue every 5 (five) minutes as needed for chest pain. 15 tablet 0  . traMADol (ULTRAM) 50 MG tablet Take 50 mg by mouth every 6 (six) hours as needed.    . traZODone (DESYREL) 50 MG tablet Take 50 mg by mouth at bedtime.     No current facility-administered medications for this visit.     Allergies:   Seroquel [quetiapine] and Olanzapine    Social  History:  The patient  reports that she has been smoking Cigarettes.  She has a 55.50 pack-year smoking history. She has never used smokeless tobacco. She reports that she drinks alcohol. She reports that she uses drugs, including "Crack" cocaine.   Family History:  The patient's ***family history includes Diabetes in her paternal grandfather; Diabetes type II in her other; Hypertension in her father, mother, and sister; Stroke in her father.    ROS:  Please see the history of present illness.   Otherwise, review of systems are positive for ***.   All other systems are reviewed and negative.    PHYSICAL EXAM: VS:  There were no vitals taken for this visit. , BMI There is no height or weight on file to calculate BMI. GEN: Well nourished, well developed, in no acute distress  HEENT: normal  Neck: no JVD, carotid bruits, or masses Cardiac: ***RRR; no murmurs, rubs, or gallops,no edema  Respiratory:  clear to auscultation bilaterally, normal work of breathing GI: soft, nontender, nondistended, + BS MS: no deformity or atrophy  Skin: warm and dry, no rash Neuro:  Strength and sensation are intact Psych: euthymic mood, full affect   EKG:   The ekg ordered today demonstrates ***   Recent Labs: 08/31/2016: ALT 14 03/30/2017: Hemoglobin 11.1; Magnesium 2.1; Platelets 193 04/01/2017: BUN 28; Creatinine, Ser 2.27; Potassium 3.9; Sodium 138   Lipid Panel    Component Value Date/Time   CHOL 185 03/30/2017 0226   TRIG 84 03/30/2017 0226   HDL 74 03/30/2017 0226   CHOLHDL 2.5 03/30/2017 0226   VLDL 17 03/30/2017 0226   LDLCALC 94 03/30/2017 0226     Other studies Reviewed: Additional studies/ records that were reviewed today with results demonstrating: ***.   ASSESSMENT AND PLAN:  1. Type B aortic dissection: Occurred in 2015.  2. Hypertensive heart disease.  3. CRI:    Current medicines are reviewed at length with the patient today.  The patient concerns regarding her medicines  were addressed.  The following changes have been made:  No change***  Labs/ tests ordered today include: *** No orders of the defined types were placed in this encounter.   Recommend 150 minutes/week of aerobic exercise Low fat, low carb, high fiber diet recommended  Disposition:   FU in ***   Signed, Lance MussJayadeep Lissett Favorite, MD  05/29/2017 2:16 PM    [name] [address], [place], [place]  [address] Phone: [phone]; Fax: [phone]

## 2017-05-30 ENCOUNTER — Ambulatory Visit: Payer: Medicaid Other | Admitting: Interventional Cardiology

## 2017-05-31 ENCOUNTER — Encounter: Payer: Self-pay | Admitting: Interventional Cardiology

## 2017-12-23 DEATH — deceased
